# Patient Record
Sex: Female | Born: 1945
Health system: Southern US, Community
[De-identification: ages and names within clinical notes are randomized; demographics above are authoritative.]

## PROBLEM LIST (undated history)

## (undated) DIAGNOSIS — I219 Acute myocardial infarction, unspecified: Secondary | ICD-10-CM

## (undated) DIAGNOSIS — B029 Zoster without complications: Secondary | ICD-10-CM

## (undated) DIAGNOSIS — T8859XA Other complications of anesthesia, initial encounter: Secondary | ICD-10-CM

## (undated) DIAGNOSIS — E785 Hyperlipidemia, unspecified: Secondary | ICD-10-CM

## (undated) DIAGNOSIS — R112 Nausea with vomiting, unspecified: Secondary | ICD-10-CM

## (undated) DIAGNOSIS — T4145XA Adverse effect of unspecified anesthetic, initial encounter: Secondary | ICD-10-CM

## (undated) DIAGNOSIS — R51 Headache: Secondary | ICD-10-CM

## (undated) DIAGNOSIS — H269 Unspecified cataract: Secondary | ICD-10-CM

## (undated) DIAGNOSIS — Z8619 Personal history of other infectious and parasitic diseases: Secondary | ICD-10-CM

## (undated) DIAGNOSIS — Z9889 Other specified postprocedural states: Secondary | ICD-10-CM

## (undated) DIAGNOSIS — I10 Essential (primary) hypertension: Secondary | ICD-10-CM

## (undated) DIAGNOSIS — I341 Nonrheumatic mitral (valve) prolapse: Secondary | ICD-10-CM

## (undated) DIAGNOSIS — I671 Cerebral aneurysm, nonruptured: Secondary | ICD-10-CM

## (undated) DIAGNOSIS — F32A Depression, unspecified: Secondary | ICD-10-CM

## (undated) DIAGNOSIS — F419 Anxiety disorder, unspecified: Secondary | ICD-10-CM

## (undated) DIAGNOSIS — T7840XA Allergy, unspecified, initial encounter: Secondary | ICD-10-CM

## (undated) DIAGNOSIS — K219 Gastro-esophageal reflux disease without esophagitis: Secondary | ICD-10-CM

## (undated) DIAGNOSIS — F329 Major depressive disorder, single episode, unspecified: Secondary | ICD-10-CM

## (undated) DIAGNOSIS — R270 Ataxia, unspecified: Secondary | ICD-10-CM

## (undated) HISTORY — DX: Cerebral aneurysm, nonruptured: I67.1

## (undated) HISTORY — DX: Allergy, unspecified, initial encounter: T78.40XA

## (undated) HISTORY — PX: BRAIN SURGERY: SHX531

## (undated) HISTORY — DX: Ataxia, unspecified: R27.0

## (undated) HISTORY — DX: Unspecified cataract: H26.9

## (undated) HISTORY — PX: ECTOPIC PREGNANCY SURGERY: SHX613

## (undated) HISTORY — PX: VASCULAR SURGERY: SHX849

## (undated) HISTORY — PX: KNEE ARTHROSCOPY: SUR90

## (undated) HISTORY — PX: TUBAL LIGATION: SHX77

## (undated) HISTORY — PX: TONSILLECTOMY: SUR1361

## (undated) HISTORY — PX: ANEURYSM COILING: SHX5349

## (undated) HISTORY — PX: APPENDECTOMY: SHX54

## (undated) HISTORY — DX: Acute myocardial infarction, unspecified: I21.9

## (undated) SURGERY — MANOMETRY, ESOPHAGUS

---

## 1985-01-02 HISTORY — PX: ABDOMINAL HYSTERECTOMY: SHX81

## 1997-07-06 ENCOUNTER — Emergency Department (HOSPITAL_COMMUNITY): Admission: EM | Admit: 1997-07-06 | Discharge: 1997-07-06 | Payer: Self-pay | Admitting: Emergency Medicine

## 1998-12-31 ENCOUNTER — Ambulatory Visit (HOSPITAL_COMMUNITY): Admission: RE | Admit: 1998-12-31 | Discharge: 1998-12-31 | Payer: Self-pay | Admitting: Gastroenterology

## 1998-12-31 ENCOUNTER — Encounter (INDEPENDENT_AMBULATORY_CARE_PROVIDER_SITE_OTHER): Payer: Self-pay | Admitting: Specialist

## 1999-01-26 ENCOUNTER — Ambulatory Visit (HOSPITAL_COMMUNITY): Admission: RE | Admit: 1999-01-26 | Discharge: 1999-01-26 | Payer: Self-pay | Admitting: Gastroenterology

## 1999-01-26 ENCOUNTER — Encounter: Payer: Self-pay | Admitting: Gastroenterology

## 1999-07-01 ENCOUNTER — Emergency Department (HOSPITAL_COMMUNITY): Admission: EM | Admit: 1999-07-01 | Discharge: 1999-07-01 | Payer: Self-pay | Admitting: Emergency Medicine

## 1999-07-01 ENCOUNTER — Encounter: Payer: Self-pay | Admitting: Family Medicine

## 1999-07-01 ENCOUNTER — Encounter: Admission: RE | Admit: 1999-07-01 | Discharge: 1999-07-01 | Payer: Self-pay | Admitting: Family Medicine

## 1999-07-01 ENCOUNTER — Encounter: Payer: Self-pay | Admitting: Emergency Medicine

## 1999-07-28 ENCOUNTER — Encounter: Payer: Self-pay | Admitting: Family Medicine

## 1999-07-28 ENCOUNTER — Encounter: Admission: RE | Admit: 1999-07-28 | Discharge: 1999-07-28 | Payer: Self-pay | Admitting: Family Medicine

## 2000-02-24 ENCOUNTER — Encounter: Payer: Self-pay | Admitting: Family Medicine

## 2000-02-24 ENCOUNTER — Encounter: Admission: RE | Admit: 2000-02-24 | Discharge: 2000-02-24 | Payer: Self-pay | Admitting: Family Medicine

## 2000-10-01 ENCOUNTER — Encounter: Admission: RE | Admit: 2000-10-01 | Discharge: 2000-10-01 | Payer: Self-pay | Admitting: Family Medicine

## 2000-10-01 ENCOUNTER — Encounter: Payer: Self-pay | Admitting: Family Medicine

## 2000-10-15 ENCOUNTER — Encounter: Admission: RE | Admit: 2000-10-15 | Discharge: 2000-10-15 | Payer: Self-pay | Admitting: Gastroenterology

## 2000-10-15 ENCOUNTER — Encounter: Payer: Self-pay | Admitting: Gastroenterology

## 2000-10-24 ENCOUNTER — Encounter: Payer: Self-pay | Admitting: Family Medicine

## 2000-10-24 ENCOUNTER — Encounter: Admission: RE | Admit: 2000-10-24 | Discharge: 2000-10-24 | Payer: Self-pay | Admitting: Family Medicine

## 2001-03-01 ENCOUNTER — Other Ambulatory Visit: Admission: RE | Admit: 2001-03-01 | Discharge: 2001-03-01 | Payer: Self-pay | Admitting: Obstetrics and Gynecology

## 2001-03-21 ENCOUNTER — Ambulatory Visit (HOSPITAL_COMMUNITY): Admission: RE | Admit: 2001-03-21 | Discharge: 2001-03-21 | Payer: Self-pay | Admitting: Family Medicine

## 2001-03-21 ENCOUNTER — Encounter: Payer: Self-pay | Admitting: Family Medicine

## 2001-04-12 ENCOUNTER — Encounter: Payer: Self-pay | Admitting: Gastroenterology

## 2001-04-12 ENCOUNTER — Encounter: Admission: RE | Admit: 2001-04-12 | Discharge: 2001-04-12 | Payer: Self-pay | Admitting: Gastroenterology

## 2001-06-04 ENCOUNTER — Ambulatory Visit (HOSPITAL_BASED_OUTPATIENT_CLINIC_OR_DEPARTMENT_OTHER): Admission: RE | Admit: 2001-06-04 | Discharge: 2001-06-04 | Payer: Self-pay | Admitting: Urology

## 2001-06-28 ENCOUNTER — Encounter: Payer: Self-pay | Admitting: Family Medicine

## 2001-06-28 ENCOUNTER — Encounter: Admission: RE | Admit: 2001-06-28 | Discharge: 2001-06-28 | Payer: Self-pay | Admitting: Family Medicine

## 2002-02-24 ENCOUNTER — Encounter: Admission: RE | Admit: 2002-02-24 | Discharge: 2002-02-24 | Payer: Self-pay | Admitting: Family Medicine

## 2002-02-24 ENCOUNTER — Encounter: Payer: Self-pay | Admitting: Family Medicine

## 2002-04-04 ENCOUNTER — Encounter: Payer: Self-pay | Admitting: Interventional Cardiology

## 2002-04-04 ENCOUNTER — Ambulatory Visit (HOSPITAL_COMMUNITY): Admission: RE | Admit: 2002-04-04 | Discharge: 2002-04-04 | Payer: Self-pay | Admitting: Interventional Cardiology

## 2002-04-06 ENCOUNTER — Ambulatory Visit (HOSPITAL_COMMUNITY): Admission: RE | Admit: 2002-04-06 | Discharge: 2002-04-06 | Payer: Self-pay | Admitting: Family Medicine

## 2002-04-06 ENCOUNTER — Encounter: Payer: Self-pay | Admitting: Family Medicine

## 2004-07-12 ENCOUNTER — Encounter: Admission: RE | Admit: 2004-07-12 | Discharge: 2004-07-12 | Payer: Self-pay | Admitting: Family Medicine

## 2004-10-30 ENCOUNTER — Encounter: Admission: RE | Admit: 2004-10-30 | Discharge: 2004-10-30 | Payer: Self-pay | Admitting: Family Medicine

## 2004-12-15 ENCOUNTER — Ambulatory Visit (HOSPITAL_BASED_OUTPATIENT_CLINIC_OR_DEPARTMENT_OTHER): Admission: RE | Admit: 2004-12-15 | Discharge: 2004-12-15 | Payer: Self-pay | Admitting: Orthopedic Surgery

## 2004-12-15 ENCOUNTER — Ambulatory Visit (HOSPITAL_COMMUNITY): Admission: RE | Admit: 2004-12-15 | Discharge: 2004-12-15 | Payer: Self-pay | Admitting: Orthopedic Surgery

## 2005-06-21 ENCOUNTER — Emergency Department (HOSPITAL_COMMUNITY): Admission: EM | Admit: 2005-06-21 | Discharge: 2005-06-21 | Payer: Self-pay | Admitting: Emergency Medicine

## 2007-12-10 ENCOUNTER — Encounter: Admission: RE | Admit: 2007-12-10 | Discharge: 2007-12-10 | Payer: Self-pay | Admitting: Family Medicine

## 2008-04-15 ENCOUNTER — Inpatient Hospital Stay (HOSPITAL_COMMUNITY): Admission: EM | Admit: 2008-04-15 | Discharge: 2008-04-22 | Payer: Self-pay | Admitting: Emergency Medicine

## 2008-04-16 ENCOUNTER — Encounter (INDEPENDENT_AMBULATORY_CARE_PROVIDER_SITE_OTHER): Payer: Self-pay | Admitting: Internal Medicine

## 2008-05-05 ENCOUNTER — Encounter: Payer: Self-pay | Admitting: Interventional Radiology

## 2008-06-01 ENCOUNTER — Emergency Department (HOSPITAL_COMMUNITY): Admission: EM | Admit: 2008-06-01 | Discharge: 2008-06-01 | Payer: Self-pay | Admitting: Emergency Medicine

## 2008-06-04 ENCOUNTER — Ambulatory Visit (HOSPITAL_COMMUNITY): Admission: RE | Admit: 2008-06-04 | Discharge: 2008-06-04 | Payer: Self-pay | Admitting: Interventional Radiology

## 2008-06-17 ENCOUNTER — Encounter: Admission: RE | Admit: 2008-06-17 | Discharge: 2008-06-17 | Payer: Self-pay | Admitting: Neurology

## 2010-01-23 ENCOUNTER — Encounter: Payer: Self-pay | Admitting: Interventional Radiology

## 2010-01-23 ENCOUNTER — Encounter: Payer: Self-pay | Admitting: Family Medicine

## 2010-01-24 ENCOUNTER — Encounter: Payer: Self-pay | Admitting: Gastroenterology

## 2010-01-24 ENCOUNTER — Encounter: Payer: Self-pay | Admitting: Interventional Radiology

## 2010-01-29 IMAGING — XA IR TRANSCATH EMBOLIZATION
4 of 6 series · 12 of 24 positions shown · non-contrast
Comparison: Angiogram of 04/18/2008.

CLINICAL DATA: Severe headaches.  Finding of left middle cerebral
artery region aneurysm.  Strong family history of ruptured
intracranial aneurysms.

ENDOVASCULAR OBLITERATION OF LEFT MIDDLE CEREBRAL ARTERY REGION
ANEURYSM

[Series 1: run · 7 of 297 slices shown (1 of 4)]
[im 22/297]
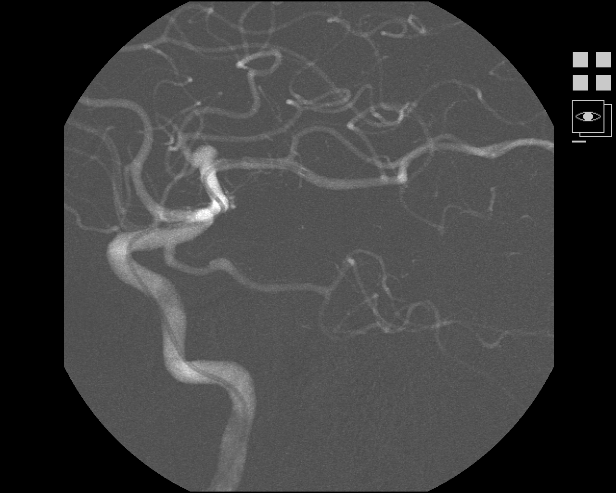
[im 64/297]
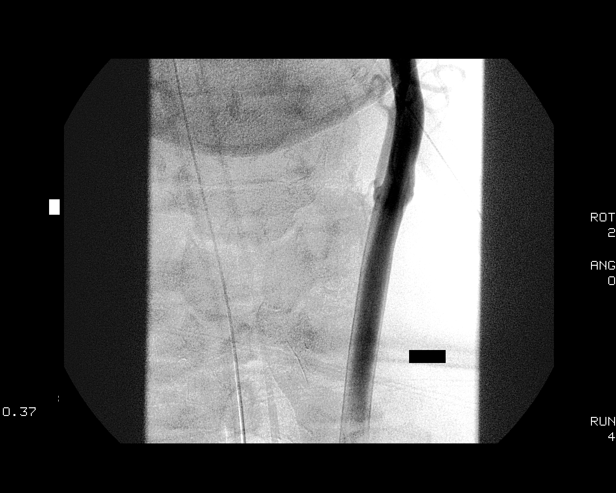
[im 106/297]
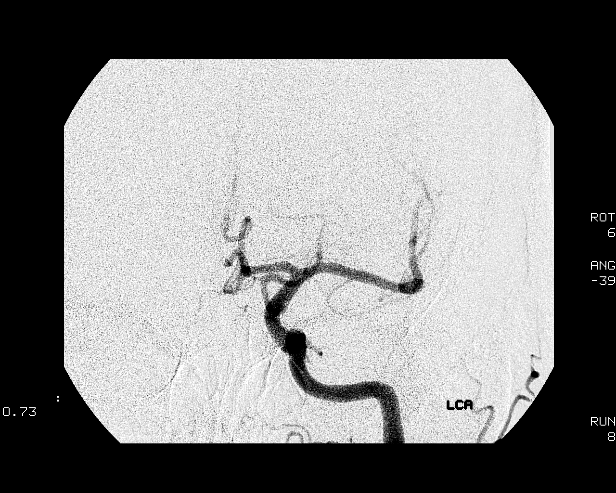
[im 149/297]
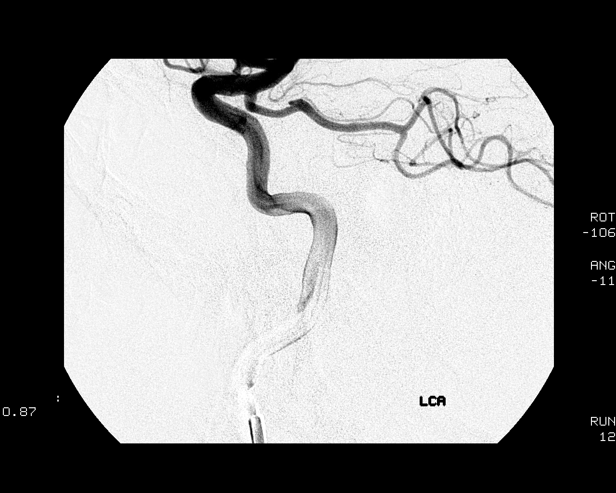
[im 191/297]
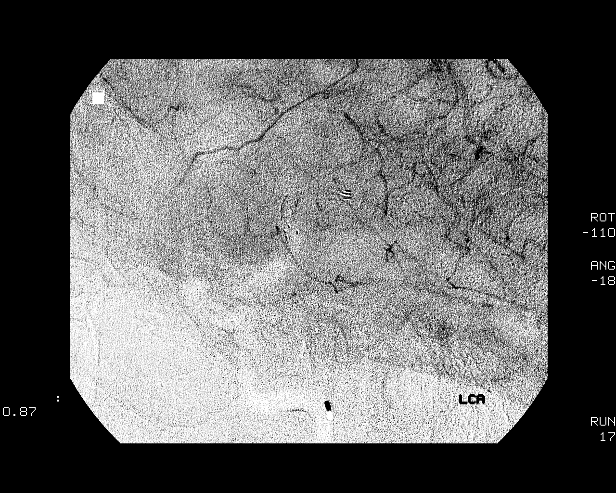
[im 233/297]
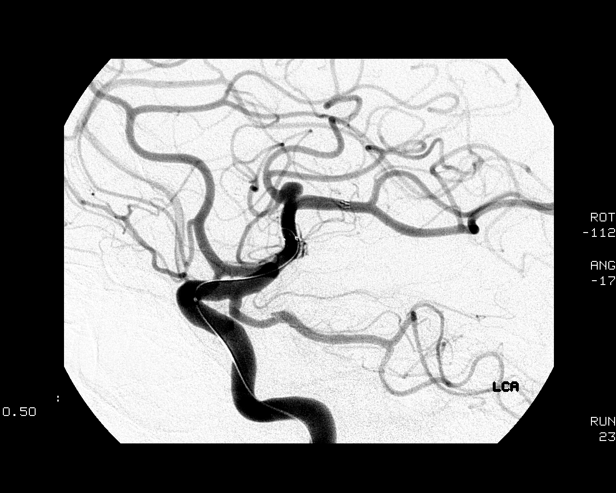
[im 275/297]
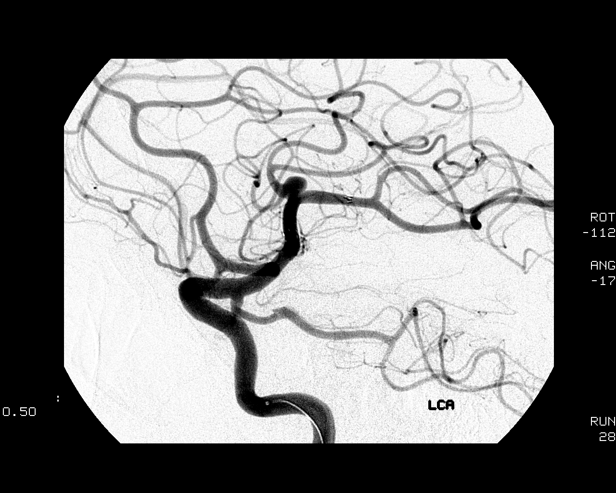

[Series 3: run · 3 of 120 slices shown (2 of 4)]
[im 1/120]
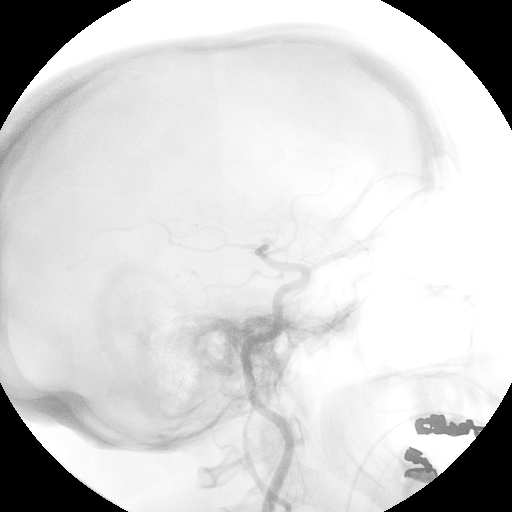
[im 60/120]
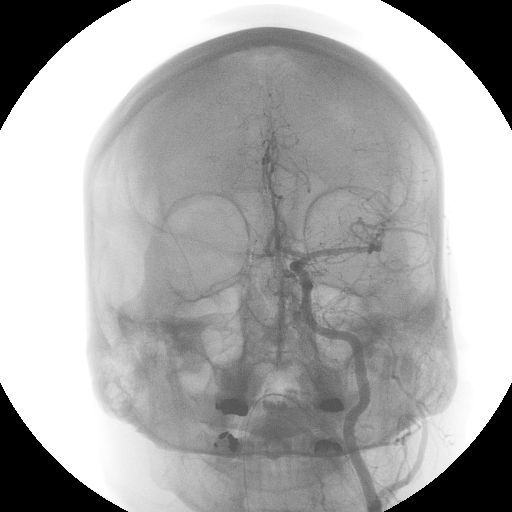
[im 120/120]
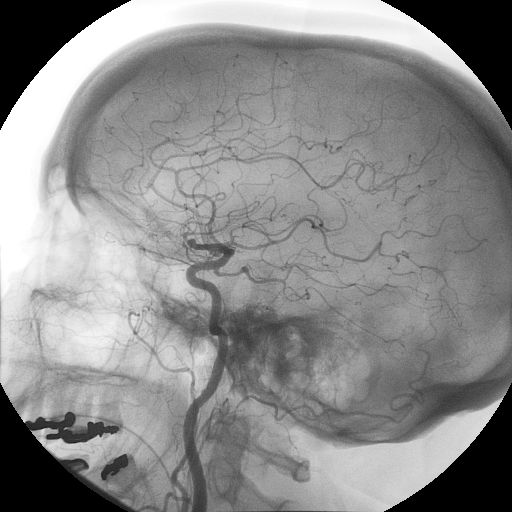

[Series 7: run · 1 of 8 slices shown (3 of 4)]
[im 1/8]
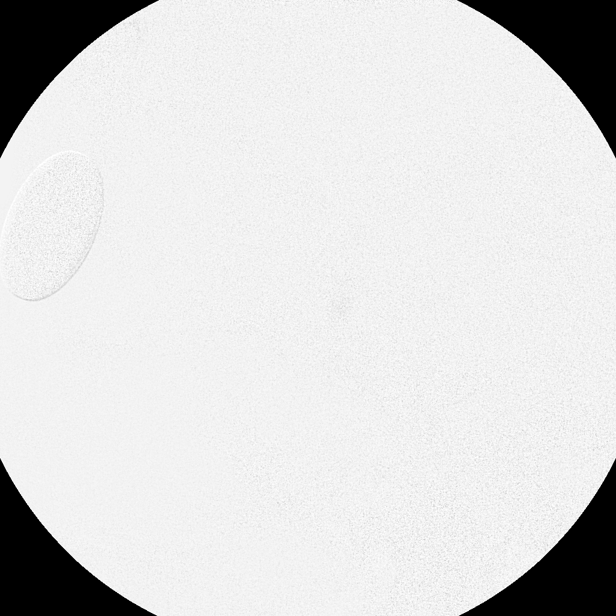

[Series 7: run · 1 of 8 slices shown (4 of 4)]
[im 1/8]
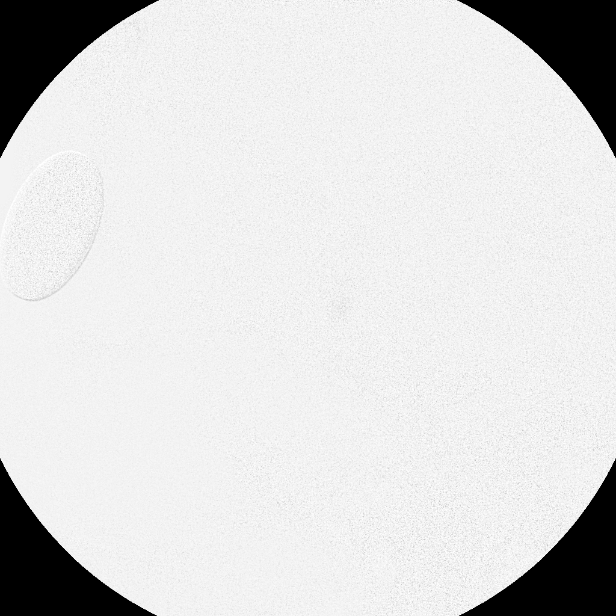

[12 of 24 positions shown; findings below may reference images not displayed]

Following a full explanation of the procedure along with the
potential associated complications, an informed witnessed consent
was obtained.

The patient was put under general anesthesia by the [REDACTED] at [HOSPITAL].

The right groin was prepped and draped in the usual sterile
fashion.  Thereafter using a modified Seldinger technique,
transfemoral access into the right common femoral artery was
obtained without difficulty.  Over a 0.035-inch guidewire, a 5-
French Pinnacle sheath was inserted.  Through this and also over a
0.035-inch guidewire, a 5-French JB1 catheter was advanced to the
aortic arch region and selectively positioned in the left common
carotid artery.  A left common carotid arteriogram centered over
the carotid bifurcation demonstrated wide patency of the left
external carotid artery and its branches.  Also the left internal
carotid artery from the bulb to the cranial skull base is normally
opacified.  The petrous, the cavernous and the supraclinoid
segments are seen to be normal.  A dominant left posterior
communicating artery is again noted opacifying the left posterior
cerebral and superior cerebellar artery distributions.

The left middle and the left anterior cerebral arteries are seen to
opacify normally into capillary and venous phases.  Cross
opacification via the anterior communicating artery of the right
anterior cerebral artery distal to the A2 segment is noted.

A 3-D rotational angiogram perfor[REDACTED]ed over the left
anterior circulation confirms the presence of a saccular wide neck
aneurysm between the origins of the superior and inferior divisions
of the left middle cerebral artery/

This measured approximately 4.5 mm x 3.5 mm.

The diagnostic JB1 catheter in the left common carotid artery was
exchanged over a 0.035-inch 300 cm Rosen exchange guidewire for a 6-
French 65 cm neurovascular sheath using biplane roadmap technique
and constant fluoroscopic guidance.  Good aspiration was obtained
from the side port of the neurovascular sheath.  A gentle contrast
injection demonstrated no evidence of spasm, dissections or of
intraluminal filling defects.  A 6-French 90 cm straight Brite Tip
Envoy guide catheter was then advanced and positioned just proximal
to the left common carotid artery bifurcation.  The guidewire was
removed.  Good aspiration was obtained from the hub of the 6-French
guide catheter.  Over a 0.035-inch Roadrunner guidewire, using
biplane roadmap technique, the 6-French guide catheter was advanced
into the distal left internal carotid artery.  The guidewire was
removed.  Good aspiration was obtained from the hub of the 6-French
guide catheter.  A gentle contrast injection demonstrated moderate
spasm of the tip.  This necessitated the proximal retrieval of part
of the guide catheter into the mid cervical portion.  Also, 25 mcg
of nitroglycerin were given intra-arterially via the guide
catheter.  No change was noted in the intracranial circulation.

In a coaxial manner and with constant heparinized saline infusion
using roadmap technique and constant fluoroscopic guidance, a
Prowler 14 select Plus 5 cm tip microcatheter was then advanced
over a 0.014-inch Softip Transend EX microguidewire to the distal
end of the guide catheter.  With the microguidewire leading with a
J-tip configuration, the combination was navigated without
difficulty into the supraclinoid left ICA.  The left middle
cerebral artery was entered with the microguidewire followed by the
microcatheter.  The microguidewire and microcatheter combination
was then manipulated without difficulty into the M2/M3 region of
the inferior division of the left middle cerebral artery.  The
guidewire was removed.  Good aspiration was obtained from the hub
of the microcatheter.  This was then connected to continuous
heparinized saline infusion.  A control arteriogram performed
through the 6-French guide catheter demonstrated safe positioning
of the tip of the microcatheter.

At this time a 4.5 mm x 22 mm Enterprise Cordis stent was prepped
and purged with heparinized saline infusion.  In a coaxial manner
and with constant heparinized saline infusion using biplane roadmap
technique and constant fluoroscopic guidance, this was advanced to
the distal end of the microcatheter in the M2/M3 region of the left
middle cerebral artery inferior division.  The stent delivery
markers were then centered over the neck of the aneurysm.  The
entire system was then straightened.  The O-ring on the delivery
microcatheter was loosened.

With slight forward gentle traction with the right hand on the
delivery microguidewire of the stent, the microcatheter was then
retrieved with the left hand, unsheathing the stent, deploying it
distal and proximal to the neck of the aneurysm.  Excellent
coverage was obtained.  The delivery microcatheter and
microguidewire were then retrieved and removed.  A control
arteriogram performed through the 6-French guide catheter
demonstrated excellent position and apposition of the stent.  Slow
flow was noted within the aneurysm itself.

At this time in a coaxial manner and with constant heparinized
saline infusion, an SL10 two-tip microcatheter which had been steam-
shaped was advanced over a 0.014-inch Softip Transend EX
microguidewire with a J-tip configuration to the distal end of the
guide catheter.  With the microguidewire leading, the combination
was manipulated and navigated with torque control into the aneurysm
just distal to the neck.  The microguidewire was gently retrieved
ensuring no forward movements of the microcatheter.  None was
observed.  This was then connected to continuous heparinized saline
infusion.  A control arteriogram performed through the 6-French
guide catheter demonstrated safe positioning of the microcatheter
within the aneurysm.  Subsequent attempts were made to advance a 3
mm x 6 cm HydroFrame 10 coil, a 3 mm x 5.4 cm MicruSphere Cerecyte
coil, and finally a 3 mm x 4 cm HydroSoft coil.  Each of these
coils was met with significant resistance with herniation of the
microcatheter into the parent vessel secondary to resistance of the
stent struts.  Aggressive maneuvering and manipulation was not
performed in view of the just placed stent lest it should move.
The procedure was therefore stopped.  A control arteriogram
performed through the 6-French guide catheter demonstrated
continued apposition of the stent without intraluminal filling
defects.  Slow flow was noted within the aneurysm itself.

It was decided to allow the stent to endothelialize prior to
placement of coils at the subsequent time.  No acute changes were
noted in the patient's blood pressure or neurological status.  The
patient's ACT was maintained in the region of 250 seconds.

The 6-French guide catheter and the 6-French neurovascular sheath
were then retrieved into the abdominal aorta and exchanged over a J-
tip guidewire for a 7-French Pinnacle sheath.  This was then
connected to continuous heparinized saline infusion.

The patient's general anesthesia was then reversed and the patient
was extubated.  Upon recovery, the patient demonstrated no new
neurological signs or symptoms.

The patient was then transported to the Neuro ICU for further
management.

IMPRESSION
1.  Status post staged endovascular treatment of relatively wide
neck left middle cerebral artery bifurcation aneurysm with
placement of endovascular Cordis stent.
2.  Placement of coils within the aneurysm was stopped for reasons
mentioned above.

The patient's overnight stay was unremarkable.  The following day
the right groin sheath was removed and hemostasis was achieved
after stopping the IV heparin.  The patient was ambulatory six
hours later.  She was discharged to return to the clinic in 2
weeks.  She will be placed on aspirin 81 mg and Plavix 75 mg.

## 2010-03-10 ENCOUNTER — Encounter (HOSPITAL_COMMUNITY): Payer: Self-pay | Admitting: Radiology

## 2010-03-10 ENCOUNTER — Emergency Department (HOSPITAL_COMMUNITY): Payer: Self-pay

## 2010-03-10 ENCOUNTER — Emergency Department (HOSPITAL_COMMUNITY)
Admission: EM | Admit: 2010-03-10 | Discharge: 2010-03-10 | Disposition: A | Discharge status: Home / Self Care | Payer: Self-pay | Attending: Emergency Medicine | Admitting: Emergency Medicine

## 2010-03-10 DIAGNOSIS — E7800 Pure hypercholesterolemia, unspecified: Secondary | ICD-10-CM | POA: Insufficient documentation

## 2010-03-10 DIAGNOSIS — R51 Headache: Secondary | ICD-10-CM | POA: Insufficient documentation

## 2010-03-10 DIAGNOSIS — Z79899 Other long term (current) drug therapy: Secondary | ICD-10-CM | POA: Insufficient documentation

## 2010-03-10 DIAGNOSIS — I10 Essential (primary) hypertension: Secondary | ICD-10-CM | POA: Insufficient documentation

## 2010-03-10 DIAGNOSIS — H538 Other visual disturbances: Secondary | ICD-10-CM | POA: Insufficient documentation

## 2010-03-10 DIAGNOSIS — F411 Generalized anxiety disorder: Secondary | ICD-10-CM | POA: Insufficient documentation

## 2010-03-10 HISTORY — DX: Nonrheumatic mitral (valve) prolapse: I34.1

## 2010-03-10 HISTORY — DX: Essential (primary) hypertension: I10

## 2010-03-10 MED ORDER — IOHEXOL 350 MG/ML SOLN
50.0000 mL | Freq: Once | INTRAVENOUS | Status: AC | PRN
  Administered 2010-03-10: 50 mL via INTRAVENOUS

## 2010-03-14 ENCOUNTER — Other Ambulatory Visit (HOSPITAL_COMMUNITY): Payer: Self-pay | Admitting: Interventional Radiology

## 2010-03-14 DIAGNOSIS — I729 Aneurysm of unspecified site: Secondary | ICD-10-CM

## 2010-03-17 ENCOUNTER — Ambulatory Visit (HOSPITAL_COMMUNITY)
Admission: RE | Admit: 2010-03-17 | Discharge: 2010-03-17 | Disposition: A | Discharge status: Home / Self Care | Payer: Self-pay | Source: Ambulatory Visit | Attending: Interventional Radiology | Admitting: Interventional Radiology

## 2010-03-17 ENCOUNTER — Other Ambulatory Visit (HOSPITAL_COMMUNITY): Payer: Self-pay | Admitting: Interventional Radiology

## 2010-03-17 ENCOUNTER — Encounter (HOSPITAL_COMMUNITY)
Admission: RE | Admit: 2010-03-17 | Discharge: 2010-03-17 | Disposition: A | Discharge status: Home / Self Care | Payer: Self-pay | Source: Ambulatory Visit | Attending: Interventional Radiology | Admitting: Interventional Radiology

## 2010-03-17 DIAGNOSIS — Z01812 Encounter for preprocedural laboratory examination: Secondary | ICD-10-CM | POA: Insufficient documentation

## 2010-03-17 DIAGNOSIS — I671 Cerebral aneurysm, nonruptured: Secondary | ICD-10-CM

## 2010-03-17 DIAGNOSIS — Z01818 Encounter for other preprocedural examination: Secondary | ICD-10-CM | POA: Insufficient documentation

## 2010-03-17 LAB — CBC
MCHC: 34.6 g/dL (ref 30.0–36.0)
Platelets: 251 10*3/uL (ref 150–400)
RDW: 12.4 % (ref 11.5–15.5)
WBC: 8.8 10*3/uL (ref 4.0–10.5)

## 2010-03-17 LAB — COMPREHENSIVE METABOLIC PANEL
Albumin: 4.4 g/dL (ref 3.5–5.2)
Alkaline Phosphatase: 64 U/L (ref 39–117)
BUN: 13 mg/dL (ref 6–23)
Chloride: 103 mEq/L (ref 96–112)
Creatinine, Ser: 0.76 mg/dL (ref 0.4–1.2)
Glucose, Bld: 92 mg/dL (ref 70–99)
Total Bilirubin: 0.8 mg/dL (ref 0.3–1.2)
Total Protein: 7 g/dL (ref 6.0–8.3)

## 2010-03-17 LAB — DIFFERENTIAL
Basophils Absolute: 0 10*3/uL (ref 0.0–0.1)
Basophils Relative: 1 % (ref 0–1)
Eosinophils Absolute: 0.5 10*3/uL (ref 0.0–0.7)
Eosinophils Relative: 6 % — ABNORMAL HIGH (ref 0–5)
Monocytes Absolute: 0.7 10*3/uL (ref 0.1–1.0)

## 2010-03-17 LAB — APTT: aPTT: 27 seconds (ref 24–37)

## 2010-03-17 LAB — SURGICAL PCR SCREEN
MRSA, PCR: NEGATIVE
Staphylococcus aureus: NEGATIVE

## 2010-03-17 LAB — PROTIME-INR: INR: 0.99 (ref 0.00–1.49)

## 2010-03-21 ENCOUNTER — Ambulatory Visit (HOSPITAL_COMMUNITY)
Admission: RE | Admit: 2010-03-21 | Discharge: 2010-03-21 | Disposition: A | Discharge status: Home / Self Care | Payer: Self-pay | Source: Ambulatory Visit | Attending: Interventional Radiology | Admitting: Interventional Radiology

## 2010-03-21 DIAGNOSIS — I729 Aneurysm of unspecified site: Secondary | ICD-10-CM

## 2010-03-21 DIAGNOSIS — I059 Rheumatic mitral valve disease, unspecified: Secondary | ICD-10-CM | POA: Insufficient documentation

## 2010-03-21 DIAGNOSIS — I6529 Occlusion and stenosis of unspecified carotid artery: Secondary | ICD-10-CM | POA: Insufficient documentation

## 2010-03-21 DIAGNOSIS — F341 Dysthymic disorder: Secondary | ICD-10-CM | POA: Insufficient documentation

## 2010-03-21 DIAGNOSIS — I671 Cerebral aneurysm, nonruptured: Secondary | ICD-10-CM | POA: Insufficient documentation

## 2010-03-21 DIAGNOSIS — Z9889 Other specified postprocedural states: Secondary | ICD-10-CM | POA: Insufficient documentation

## 2010-03-21 DIAGNOSIS — I10 Essential (primary) hypertension: Secondary | ICD-10-CM | POA: Insufficient documentation

## 2010-03-21 DIAGNOSIS — K219 Gastro-esophageal reflux disease without esophagitis: Secondary | ICD-10-CM | POA: Insufficient documentation

## 2010-03-21 DIAGNOSIS — R51 Headache: Secondary | ICD-10-CM | POA: Insufficient documentation

## 2010-03-21 DIAGNOSIS — K222 Esophageal obstruction: Secondary | ICD-10-CM | POA: Insufficient documentation

## 2010-03-21 MED ORDER — IOHEXOL 300 MG/ML  SOLN
150.0000 mL | Freq: Once | INTRAMUSCULAR | Status: AC | PRN
  Administered 2010-03-21: 60 mL via INTRA_ARTERIAL

## 2010-04-11 LAB — CBC
HCT: 38.3 % (ref 36.0–46.0)
Hemoglobin: 12.9 g/dL (ref 12.0–15.0)
MCHC: 33.8 g/dL (ref 30.0–36.0)
RDW: 12.9 % (ref 11.5–15.5)

## 2010-04-11 LAB — BASIC METABOLIC PANEL
BUN: 14 mg/dL (ref 6–23)
Chloride: 105 mEq/L (ref 96–112)
Creatinine, Ser: 0.65 mg/dL (ref 0.4–1.2)
GFR calc Af Amer: >60 mL/min (ref >60)
GFR calc non Af Amer: >60 mL/min (ref >60)

## 2010-04-11 LAB — DIFFERENTIAL
Basophils Absolute: 0.1 10*3/uL (ref 0.0–0.1)
Basophils Relative: 2 % — ABNORMAL HIGH (ref 0–1)
Eosinophils Absolute: 0.3 10*3/uL (ref 0.0–0.7)
Eosinophils Relative: 4 % (ref 0–5)
Monocytes Absolute: 0.4 10*3/uL (ref 0.1–1.0)

## 2010-04-13 LAB — URINALYSIS, ROUTINE W REFLEX MICROSCOPIC
Bilirubin Urine: NEGATIVE
Glucose, UA: NEGATIVE mg/dL
Hgb urine dipstick: NEGATIVE
Protein, ur: NEGATIVE mg/dL
Specific Gravity, Urine: 1.008 (ref 1.005–1.030)
Urobilinogen, UA: 0.2 mg/dL (ref 0.0–1.0)

## 2010-04-13 LAB — CBC
HCT: 35.2 % — ABNORMAL LOW (ref 36.0–46.0)
HCT: 36.2 % (ref 36.0–46.0)
HCT: 37.5 % (ref 36.0–46.0)
Hemoglobin: 12.1 g/dL (ref 12.0–15.0)
MCHC: 34.3 g/dL (ref 30.0–36.0)
MCHC: 34.6 g/dL (ref 30.0–36.0)
MCHC: 35.1 g/dL (ref 30.0–36.0)
MCHC: 34.6 g/dL (ref 30.0–36.0)
MCHC: 34.8 g/dL (ref 30.0–36.0)
MCV: 91.4 fL (ref 78.0–100.0)
MCV: 92.3 fL (ref 78.0–100.0)
MCV: 93 fL (ref 78.0–100.0)
MCV: 91.6 fL (ref 78.0–100.0)
MCV: 92.5 fL (ref 78.0–100.0)
Platelets: 191 10*3/uL (ref 150–400)
Platelets: 218 10*3/uL (ref 150–400)
Platelets: 220 10*3/uL (ref 150–400)
Platelets: 215 10*3/uL (ref 150–400)
RBC: 3.87 MIL/uL (ref 3.87–5.11)
RBC: 4.09 MIL/uL (ref 3.87–5.11)
RDW: 12.9 % (ref 11.5–15.5)
RDW: 13 % (ref 11.5–15.5)
RDW: 13 % (ref 11.5–15.5)
RDW: 13.1 % (ref 11.5–15.5)
WBC: 5.3 10*3/uL (ref 4.0–10.5)
WBC: 7 10*3/uL (ref 4.0–10.5)

## 2010-04-13 LAB — BASIC METABOLIC PANEL
BUN: 8 mg/dL (ref 6–23)
BUN: 9 mg/dL (ref 6–23)
BUN: 12 mg/dL (ref 6–23)
CO2: 20 mEq/L (ref 19–32)
CO2: 29 mEq/L (ref 19–32)
CO2: 30 mEq/L (ref 19–32)
Calcium: 7.9 mg/dL — ABNORMAL LOW (ref 8.4–10.5)
Calcium: 8.9 mg/dL (ref 8.4–10.5)
Calcium: 8.7 mg/dL (ref 8.4–10.5)
Chloride: 102 mEq/L (ref 96–112)
Chloride: 104 mEq/L (ref 96–112)
Chloride: 104 mEq/L (ref 96–112)
Chloride: 106 mEq/L (ref 96–112)
Creatinine, Ser: 0.58 mg/dL (ref 0.4–1.2)
Creatinine, Ser: 0.67 mg/dL (ref 0.4–1.2)
Creatinine, Ser: 0.73 mg/dL (ref 0.4–1.2)
Creatinine, Ser: 0.73 mg/dL (ref 0.4–1.2)
Creatinine, Ser: 0.74 mg/dL (ref 0.4–1.2)
GFR calc Af Amer: >60 mL/min (ref >60)
GFR calc Af Amer: >60 mL/min (ref >60)
GFR calc non Af Amer: >60 mL/min (ref >60)
Glucose, Bld: 83 mg/dL (ref 70–99)
Glucose, Bld: 93 mg/dL (ref 70–99)
Potassium: 4.1 mEq/L (ref 3.5–5.1)
Sodium: 141 mEq/L (ref 135–145)

## 2010-04-13 LAB — PROTIME-INR
INR: 1.1 (ref 0.00–1.49)
INR: 1.1 (ref 0.00–1.49)
Prothrombin Time: 14.4 seconds (ref 11.6–15.2)
Prothrombin Time: 14.3 seconds (ref 11.6–15.2)

## 2010-04-13 LAB — CARDIAC PANEL(CRET KIN+CKTOT+MB+TROPI)
Relative Index: INVALID (ref 0.0–2.5)
Total CK: 50 U/L (ref 7–177)

## 2010-04-13 LAB — POCT CARDIAC MARKERS
CKMB, poc: <1 ng/mL — ABNORMAL LOW (ref 1.0–8.0)
Myoglobin, poc: 46.4 ng/mL (ref 12–200)
Troponin i, poc: <0.05 ng/mL (ref 0.00–0.09)

## 2010-04-13 LAB — DIFFERENTIAL
Basophils Absolute: 0 10*3/uL (ref 0.0–0.1)
Lymphocytes Relative: 23 % (ref 12–46)
Lymphs Abs: 1.7 10*3/uL (ref 0.7–4.0)
Neutro Abs: 4.8 10*3/uL (ref 1.7–7.7)
Neutrophils Relative %: 65 % (ref 43–77)

## 2010-04-13 LAB — LIPID PANEL
Cholesterol: 180 mg/dL (ref 0–200)
HDL: 49 mg/dL (ref >39)
LDL Cholesterol: 101 mg/dL — ABNORMAL HIGH (ref 0–99)
Total CHOL/HDL Ratio: 3.7 RATIO

## 2010-04-13 LAB — TROPONIN I: Troponin I: <0.01 ng/mL (ref 0.00–0.06)

## 2010-04-13 LAB — COMPREHENSIVE METABOLIC PANEL
AST: 35 U/L (ref 0–37)
Albumin: 3.3 g/dL — ABNORMAL LOW (ref 3.5–5.2)
Alkaline Phosphatase: 53 U/L (ref 39–117)
BUN: 9 mg/dL (ref 6–23)
BUN: 11 mg/dL (ref 6–23)
CO2: 27 mEq/L (ref 19–32)
Calcium: 9.4 mg/dL (ref 8.4–10.5)
Creatinine, Ser: 0.75 mg/dL (ref 0.4–1.2)
Creatinine, Ser: 0.6 mg/dL (ref 0.4–1.2)
GFR calc Af Amer: >60 mL/min (ref >60)
GFR calc non Af Amer: >60 mL/min (ref >60)
Potassium: 4 mEq/L (ref 3.5–5.1)
Total Bilirubin: 0.7 mg/dL (ref 0.3–1.2)
Total Protein: 5.8 g/dL — ABNORMAL LOW (ref 6.0–8.3)

## 2010-04-13 LAB — BRAIN NATRIURETIC PEPTIDE: Pro B Natriuretic peptide (BNP): 83 pg/mL (ref 0.0–100.0)

## 2010-04-13 LAB — MAGNESIUM: Magnesium: 2.1 mg/dL (ref 1.5–2.5)

## 2010-04-13 LAB — LIPASE, BLOOD: Lipase: 34 U/L (ref 11–59)

## 2010-05-17 NOTE — Consult Note (Signed)
NAMEFERRIS, Amber Brooks             ACCOUNT NO.:  [id number]   MEDICAL RECORD NO.:  [id number]          PATIENT TYPE:  OBV   LOCATION:  3736                         FACILITY:  [facility]   PHYSICIAN:  Jake Bathe, MD      DATE OF BIRTH:  29-Nov-1945   DATE OF CONSULTATION:  04/20/2008  DATE OF DISCHARGE:                                 CONSULTATION   REFERRING PHYSICIAN:  Sanjeev K. Corliss Skains, MD with Interventional  Radiology.   PRIMARY PHYSICIAN:  Donia Guiles, MD   CARDIOLOGIST:  Lyn Records, MD   REASON FOR CONSULTATION:  Preoperative risk assessment for aneurysmal  clipping of left middle cerebral artery.   HISTORY OF PRESENT ILLNESS:  A 65 year old female with no prior  cardiovascular history, who recently had a nuclear stress test in  December 2009, read by Dr. Katrinka Blazing with normal ejection fraction, low  risk, no ischemia present.  She was having chest pain at that time,  which was intermittent.  She was admitted on April 15, 2008, for chest  discomfort, and she ended up having a barium swallow, which reproduced  the chest pain exactly.  This resolved with hyoscyamine.  She also  underwent an echocardiogram here during this hospitalization after an  EKG showed poor R-wave progression, concerning for possible old anterior  myocardial infarction.  She also had small nonpathologic Q waves in the  inferior leads.  We were asked for preoperative risk assessment.  She  denies any significant difficulties with ambulation of 1-2 flights of  stairs.  No significant dyspnea.  She has no syncope, no bleeding  problems.  As stated above, nuclear stress test is low risk.  MRI/MRA  showed a 6-mm left MCA bifurcation aneurysm, which is going to be coiled  tomorrow.   PAST MEDICAL HISTORY:  1. Hypertension.  2. Mitral valve prolapse.  3. Hyperlipidemia.  4. Anxiety.  5. Migraines.  6. Seasonal allergies.  7. Depression.  8. GERD.  9. Esophageal strictures status post  dilatation.   ALLERGIES:  SULFA.   MEDICATIONS:  Aspirin, Plavix, estradiol, Vytorin, hydrochlorothiazide  12.5 mg once a day, nadolol 40 mg a day, Benicar 40 mg a day, Protonix  40 mg a day.   SOCIAL HISTORY:  She lives in [place].  She is divorced.  She is a  Sales executive.  No smoking.  No alcohol.  No drug use.   FAMILY HISTORY:  Her mother died at age 39 from COPD.  Her father died  at age 19 from heart failure and coronary artery disease.   REVIEW OF SYSTEMS:  Positive for headache, which did prompt her brain  MRI/MRA.  Chest pain, which is reproducible with barium swallow.  Otherwise, all 12-review of systems negative.   PHYSICAL EXAMINATION:  VITAL SIGNS:  Temperature 97.2, pulse 64,  respirations 18, blood pressure 108/48, oxygen saturation 90% on room  air.  GENERAL:  Alert and oriented x3, in no acute distress.  HEENT:  Eyes, well-perfused conjunctivae.  EOMI.  No scleral icterus.  NECK:  Supple.  No lymphadenopathy.  No thyromegaly.  No carotid bruits.  No JVD.  Normal carotid upstrokes.  Full range of motion.  CARDIOVASCULAR:  Regular rate and rhythm without any appreciable  murmurs, rubs, or gallops or clicks.  Normal PMI.  LUNGS:  Clear to auscultation bilaterally.  Normal respiratory effort.  ABDOMEN:  Soft, nontender, normoactive bowel sounds.  No rebound.  No  guarding.  No bruits.  EXTREMITIES:  No clubbing, cyanosis, or edema.  Normal distal pulses.  NEUROLOGIC:  Nonfocal.  No tremors.  SKIN:  Warm, dry, intact.  No rashes.  PSYCH:  Normal affect.   Chest x-ray personally viewed shows no acute disease.  MRI of brain  shows a 6 x 2 x 5 mm left MCA bifurcation aneurysm.  Barium swallow  showed nonspecific dysmotility.  Ultrasound of her abdomen was negative.   White count 5.8, hemoglobin 12.4, BUN 9, creatinine 0.7, glucose normal,  magnesium 2.1.  D-dimer normal.  Cardiac biomarkers negative x3.   EKG personally viewed shows normal sinus rhythm, rate  64 with small Q  waves inferiorly, nonpathologic with poor R-wave progression.  No change  from prior ECG in December 2009.   ASSESSMENT AND PLAN:  1. Preoperative cardiovascular risk assessment - she is low risk from      a cardiovascular standpoint for brain aneurysmal coiling.  She      recently had a nuclear stress test, which was low risk showing no      evidence of ischemia with normal ejection fraction.  Her      echocardiogram also during this hospitalization was reassuring      showing no wall motion abnormalities.  Continue to monitor blood      pressure closely as she is on multiple antihypertensive agents.  2. Hypertension - as above, on both nadolol and Benicar, as well as      hydrochlorothiazide.  Continue to monitor closely.  3. Hyperlipidemia - continue Vytorin.  4. Chest pain/epigastric pain - at this point, it seems as though the      barium swallow demonstrated her exact      symptoms of chest discomfort.  Continue to treat from a GI      perspective.  We will be happy to answer any other questions.      Findings have been relayed to physician assistance, Michael Litter, with Neurointerventional Radiology.      Jake Bathe, MD  Electronically Signed     MCS/MEDQ  D:  04/20/2008  T:  04/21/2008  Job:  [id number]   cc:   Donia Guiles, M.D.  Lyn Records, M.D.  Sanjeev K. Corliss Skains, M.D.

## 2010-05-17 NOTE — Consult Note (Signed)
NAMESHANTE, ARCHAMBEAULT             ACCOUNT NO.:  [id number]   MEDICAL RECORD NO.:  [id number]          PATIENT TYPE:  OBV   LOCATION:                               FACILITY:  [facility]   PHYSICIAN:  John C. Madilyn Fireman, M.D.    DATE OF BIRTH:  1945-04-30   DATE OF CONSULTATION:  DATE OF DISCHARGE:                                 CONSULTATION   Ms. Otte is a very pleasant 65 year old female, who has a history of  esophageal dysphagia.  She is status post esophageal dilation.  She had  chest pain several days ago and was admitted to the hospital thinking  that she was having a heart attack.  Her chest pain occurred while she  was trying to swallow pills after eating bread.  She occasionally has  dysphagia to both solids and liquids; however, she denies heartburn,  abdominal pain, or changes in bowel habits.  Her chest pain is typically  long lasting, and it does radiate to her right shoulder.  The patient  reports the family history is strong for gallbladder disease.  She has  had a negative Cardiology workup here in the hospital.  She did have an  incidental finding on MRI scan of an MCA bifurcation aneurysm, for which  she is having Interventional Radiology coiling done tomorrow.   The patient reports 2 previous esophageal dilations, both of which  helped her dysphagia for a period of time.   PAST MEDICAL HISTORY:  Significant for:  1. Hypertension.  2. Hyperlipidemia.  3. Mitral valve prolapse.  4. Anxiety.  5. Allergies.  6. Depression.  7. History of urinary incontinence status post suprapubic arch sling.  8. History of rotator cuff tear/repair/debridement.  9. History of esophageal spasm status post dilation.   MEDICATIONS:  Nexium, the rest are per chart.   ALLERGIES:  SULFA.   REVIEW OF SYSTEMS:  Significant for severe headaches.   SOCIAL HISTORY:  Negative for alcohol, tobacco, and drugs.  She works as  a Sales executive.   FAMILY HISTORY:  Significant for colon cancer  in 1 brother, liver cancer  in 1 brother, and gallbladder disease in multiple siblings including her  daughter and her mother.   PHYSICAL EXAMINATION:  GENERAL:  She is alert and oriented, in no  apparent distress.  VITAL SIGNS:  Her temperature is 97.2, pulse 50, respirations 18, blood  pressure is 108/48.  HEART:  Regular rate and rhythm.  LUNGS:  Clear.  ABDOMEN:  Soft, nontender, nondistended with good bowel sounds.   LABORATORY DATA:  Hemoglobin of 12.4, hematocrit 36.2, white count 5.8,  platelets 191,000.  BMET within normal limits.  LFTs are within normal  limits.  Amylase and lipase are within normal limits.  She had an  ultrasound done of her abdomen that showed no acute cholecystitis.  She  had a barium swallow done that showed esophageal motility disorder with  disruption of all primary peristaltic waves.  She did have a 13-mm  tablet that did not pass despite lack of stricture.   ASSESSMENT:  Dr. Dorena Cookey has seen and examined the patient,  collected  a history, and reviewed her chart.  His impression is that this is a 60-  year-old female who has esophageal motility disorder and spasm, for  which she is doing much better on Levbid.  She also has an middle  cerebral artery aneurysm, diagnosed here in the hospital.  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  Her LFTs are normal.  Her gallbladder ultrasound is  negative.  We would recommend:  1. Esophageal dilations as an outpatient in 2-3 weeks after her      aneurysm coiling.  2. To check HIDA scan as an inpatient before discharge.   Thanks very much for this consultation.      Stephani Police, PA    ______________________________  Everardo All Madilyn Fireman, M.D.    MLY/MEDQ  D:  04/20/2008  T:  04/21/2008  Job:  [id number]

## 2010-05-17 NOTE — Consult Note (Signed)
NAME:  Amber Brooks, Amber Brooks             ACCOUNT NO.:  [id number]   MEDICAL RECORD NO.:  [id number]          PATIENT TYPE:  OUT   LOCATION:  XRAY                         FACILITY:  [facility]   PHYSICIAN:  Sanjeev K. Deveshwar, M.D.DATE OF BIRTH:  03-15-1945   DATE OF CONSULTATION:  DATE OF DISCHARGE:                                 CONSULTATION   CHIEF COMPLAINT:  Status post left middle cerebral artery aneurysm  stenting performed on April 21, 2008.   BRIEF HISTORY:  This is a very pleasant 65 year old female, who was  recently admitted to [facility] on April 15, 2008, for  evaluation of chest pain.  During that admission an MI was ruled out and  was felt that her symptoms were probably GI in etiology.  She began to  experience headaches and visual problems and an MRI/MRA was ordered.  This was performed on April 17, 2008.  The patient was found to have a  left middle cerebral artery aneurysm.  Dr. Corliss Skains was asked to  evaluate the patient.  He did not feel that the patient's symptoms of  headache and visual problems were coming from the aneurysm, although he  did recommend repair of the aneurysm.  The procedures were described in  detail as well as the risks and benefits.  The patient was also seen in  consultation by Dr. Colon Branch, a neurosurgeon, who also agreed with  endovascular treatment of the aneurysm.   On April 21, 2008, the patient had a cerebral angiogram, which did  confirm a 4.5 mm x 3.5 mm unruptured left middle cerebral artery  aneurysm.  The plan was for a stent-assisted coiling procedure.  However, after the stent was placed, the aneurysm could not be coiled  due to movement of the stent.  A decision was made to make this a staged  procedure, where the patient will return at a later date for coiling.   The patient returns today and accompanied by her daughter to be seen in  followup.   PAST MEDICAL HISTORY:  Significant for hypertension, gastroesophageal  reflux disease, hyperlipidemia, anxiety, depression, and esophageal  stricture.  She had a 2-D echo performed on April 16, 2008, that  revealed normal systolic function.  She had a stress test in December  2009, which apparently was normal.  She does have some allergies.  She  does have a remote history of severe headaches.   SURGICAL HISTORY:  Significant for rotator cuff repair and bladder  surgery.  She has had nausea and vomiting in the past with anesthesia.   CURRENT MEDICATIONS:  1. Nadolol.  2. Fexofenadine.  3. Plavix.  4. K-Dur.  5. Estradiol.  6. Effexor.  7. Aspirin 81 mg.  8. Nexium.  9. Micardis.  10.Alprazolam.  11.Midrin.  12.Ultram for pain.  13.Hyoscyamine.   SOCIAL HISTORY:  The patient lives with her adopted daughter in  [place].  The patient is divorced.  She works as a Sales executive.  She does not use alcohol or tobacco.   FAMILY HISTORY:  The patient's mother died at age 8 from COPD.  Her  father died at age 56 from heart disease.  Apparently, there is a family  history of cerebral aneurysms.   IMPRESSION AND PLAN:  The patient returns today to be seen in followup  after undergoing coiling of a left middle cerebral artery aneurysm on  April 21, 2008.  She had developed headaches and visual changes while in  the hospital prior to the intervention.  She states she is still having  headaches, which she describes as being a 9 on a 1-10 scale.  She feels  her headaches are more frequent.  She is continued to have visual  problems, which she describes as a blurring of the vision, white spots  in her vision, and occasional tunnel vision.  She saw her primary care  practice and was changed from Imitrex to Midrin.  She continues to have  headaches almost daily and a headache sometimes last all day long.  She  does have a remote history of severe headaches in her 67s.  These were  treated with a beta-blocker at that time and for the most part resolved.  We  did recommend that the patient see a headache specialist, as Dr.  Corliss Skains does not feel that the headaches or the visual changes are  related to her aneurysm.   The patient is anxious to have further treatment of her aneurysm in the  form of a coiling as previously discussed with Dr. Corliss Skains.  Dr.  Corliss Skains felt that this could be accomplished in approximately 6 weeks  from today.  He recommended that the patient change her Plavix from  every day to every other day and to continue until she only has 3 pills  left.  These will be taken just prior to her next intervention.  She is  to increase her aspirin from 81 mg to 325 mg, when she stops the Plavix.  The patient apparently is continued to have some GI problems and she is  due to have a HIDA scan on April 20, 2008, however, the  gastroenterologist are hesitant to proceed with any further evaluation  until her aneurysm has been completely addressed and she is able to come  off the Plavix.   Dr. Corliss Skains told the patient that she could return to work at this  time if she would like, she could resume her normal activities, and once  again we will plan coiling of the aneurysm to be performed in  approximately 6 weeks.      Delton See, P.A.    ______________________________  Grandville Silos. Corliss Skains, M.D.    DR/MEDQ  D:  05/05/2008  T:  05/06/2008  Job:  [id number]   cc:   Clydene Fake, M.D.  Ramiro Harvest, MD  Donia Guiles, M.D.  Lyn Records, M.D.
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
## 2010-05-20 NOTE — Op Note (Signed)
[facility]  Patient:    Amber Brooks, Amber Brooks Visit Number: [id number] MRN: [id number]          Service Type: NES Location: NESC Attending Physician:  Evlyn Clines Dictated by:   Excell Seltzer. Annabell Howells, M.D. Proc. Date: 06/04/01 Admit Date:  06/04/2001   CC:         Guy Sandifer. Arleta Creek, M.D.  Desma Maxim, M.D.  Darci Needle, M.D.   Operative Report  PROCEDURE:  SPARC sling.  PREOPERATIVE DIAGNOSIS:  Stress urinary incontinence.  POSTOPERATIVE DIAGNOSIS:  Stress urinary incontinence.  SURGEON:  Excell Seltzer. Annabell Howells, M.D.  ANESTHESIA:  General.  DRAIN:  Foley.  COMPLICATIONS:  None.  INDICATIONS:  Ms. Green is a 65 year old white female with stress urinary incontinence, who has elected to undergo a SPARC sling.  FINDINGS AND PROCEDURE:  The patient was given p.o. Levaquin and ampicillin preoperatively.  She was taken to the operating room where general anesthetic was induced.  She was placed in lithotomy position, and her mons was shaved. She was prepped with Betadine solution, and she was draped in the usual sterile fashion.  Two 1.5 cm incisions were made above the pubis, 2 cm lateral to the midline, and one each side.  A weighted vaginal retractor was then placed.  A Foley catheter was inserted, and the bladder was drained.  The anterior vaginal wall over the mid urethral area was infiltrated with 5 cc of 1% lidocaine with epinephrine, and a mid anterior incision was made over this area.  The vaginal mucosa was elevated off the pubourethral fascia for approximately 2 cm laterally  on each side.  The [facility] trocars were then brought onto the field.  The first was passed through the right abdominal incision.  The tip was passed down through the abdominal wall fascia until it touched the pubis.  It was then walked along the back of the pubis until it could be palpated with a finger in the right vaginal incision, which was also being  used to hold the urethra out of the way.  The trocar was then advanced into the vaginal incision.  This was repeated on the left.  Cystoscopy was then performed.   No evidence of bladder wall injury or urethral injury was identified.  I then passed the [facility] sling material to the trocars and drew them back into the abdominal incisions.  Cystoscopy was repeated; no evidence of bladder wall injury was noted once again.  At this point, the [facility] sling was approximately tensioned, and the sheaths were removed.  The bladder was left full after the last cystoscopy, and pressure on the bladder was used to produce urine flow that allowed assessment of the sling tension.  Once tension was felt to be adequate, the Foley catheter was reinserted.  I was able to pass a fine right angle beneath the sling easily, indicating appropriate gap. The anterior vaginal wall was closed using a running locked 2-0 Vicryl stitch. The suprapubic incisions were closed with tincture of Benzoin and Steri-Strips after trimming the redundant sling material.  At this point, a two-inch Iodoform vaginal pack was placed.  The Foley was placed to straight drainage. The patient was taken down from lithotomy position; her anesthetic was reversed, and she moved to the recovery room in stable condition.  There were no complications. Dictated by:   Excell Seltzer. Annabell Howells, M.D. Attending Physician:  Evlyn Clines DD:  06/04/01 TD:  06/05/01 Job: [id number]

## 2010-05-20 NOTE — Op Note (Signed)
Amber Brooks, Amber Brooks             ACCOUNT NO.:  [id number]   MEDICAL RECORD NO.:  [id number]          PATIENT TYPE:  AMB   LOCATION:  DSC                          FACILITY:  [facility]   PHYSICIAN:  Nadara Mustard, MD     DATE OF BIRTH:  1945/04/12   DATE OF PROCEDURE:  12/15/2004  DATE OF DISCHARGE:                                 OPERATIVE REPORT   PREOPERATIVE DIAGNOSIS:  Right shoulder impingement syndrome with rotator  cuff tear.   POSTOPERATIVE DIAGNOSIS:  Right shoulder rotator cuff tear was hooked type  III acromion with impingement syndrome and a grade 1 superior labrum  anterior and posterior lesion.   PROCEDURE:  1.  Debridement of rotator cuff tear and debridement of superior labrum      anterior and posterior lesion.  2.  Subacromial decompression.   SURGEON.:  Nadara Mustard, MD   ANESTHESIA:  LMA plus interscalene block.   ESTIMATED BLOOD LOSS:  Minimal.   ANTIBIOTICS:  None.   DRAINS:  None.   COMPLICATIONS:  None.   DISPOSITION:  To PACU in stable condition.   INDICATION FOR PROCEDURE:  The patient is a 65 year old woman who has a  traumatic injury to her right shoulder.  She had failed conservative care.  MRI scan confirmed a rotator cuff tear.  The patient presents at this time  for surgical intervention after failure of conservative care.  The risks and  benefits were discussed including infection, neurovascular injury,  persistent pain, need for additional surgery.  The patient states he  understands and wishes to proceed at this time.   DESCRIPTION OF PROCEDURE:  The patient was brought to OR room 8 after  undergoing an interscalene block.  The patient then underwent a general  anesthetic.  After an adequate level of anesthesia was obtained, the patient  was placed in the beach-chair position and her right upper extremity was  prepped using DuraPrep and draped in a sterile field.  The scope was  inserted from the posterior portal and an anterior  portal was established  with outside-in technique with an 18-gauge spinal.  The intra-articular  visualization showed a grade 1 SLAP lesion, which was debrided with a shaver  and the vapor Mitek.  The patient did have a tear of the rotator cuff which  measured less than 1 x 1 cm.  This was debrided.  She had good attachment of  the rotator cuff at the humeral head.  The biceps tendon was intact.  There  were no articular cartilage defects of the humeral head or glenoid.  The  biceps had a good attachment.  The instruments were then removed.  The scope  was then inserted from the posterior portal in the subacromial space and a  lateral portal was established.  The patient has a significant amount of  subacromial bursitis and this was debrided with the shaver and the vapor  Mitek.  She had very hooked type III acromion and this was debrided with the  acromionizer.  Post-debridement showed good decompression.  The rotator cuff  tear was then again debrided from  the subacromial space.  The instruments  removed and the portals were closed using 4-0 nylon.  The wounds were  covered with Adaptic, orthopedic sponges, ABD dressing and Hypafix tape.  The patient was extubated, placed in a sling and taken to the PACU in stable  condition.   Plan for discharge to home.  Follow up in the office in 2 weeks.      Nadara Mustard, MD  Electronically Signed     MVD/MEDQ  D:  12/15/2004  T:  12/19/2004  Job:  [id number]

## 2010-05-20 NOTE — Discharge Summary (Signed)
Amber Brooks, Amber Brooks             ACCOUNT NO.:  [id number]   MEDICAL RECORD NO.:  [id number]          PATIENT TYPE:  INP   LOCATION:  3101                         FACILITY:  [facility]   PHYSICIAN:  Kela Millin, M.D.DATE OF BIRTH:  09-Apr-1945   DATE OF ADMISSION:  04/15/2008  DATE OF DISCHARGE:  04/22/2008                               DISCHARGE SUMMARY   DISCHARGE DIAGNOSES:  1. Chest pain, secondary to esophageal dysmotility with stricture.      The patient to follow up with gastroenterology.  2. 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 Corliss Skains.  Coil placement met with instability of the      stent per Dr. Corliss Skains and it was stopped.  3. Hypertension.  4. Hypokalemia, potassium replaced.  5. History of depression/anxiety.  6. Gastroesophageal reflux disease.  7. Hyperlipidemia.  8. History of mitral valve prolapse.   PROCEDURES AND STUDIES:  1. Abdominal ultrasound - normal appearance of the abdomen.  2. MRI - acute intracranial abnormality.  Scattered subcortical T2      hyperintensities, slightly greater in number than expected for age.      The finding is nonspecific, but can be seen in the setting of      chronic microvascular ischemia, a demyelinating process such as      multiple sclerosis, vasculitis, complicating migraine headaches, or      sequela of prior infectious or inflammatory process.  3. MRA of the brain - a 6 x 2 x 5 mm left MCA bifurcation.  4. MRA of the neck - A 6 mm left MCA bifurcation aneurysm.  No      significant carotid or vertebral artery disease proximally.      Irregular appearance of the mid cervical internal carotid artery,      right worse than left, suggestive of fibromuscular dysplasia.  5. Carotid angiogram - approximately 5.5 mm x 5 mm left MCA cerebral      artery region aneurysm, mild atherosclerotic disease involving the    right carotid bulb.  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  6. Endovascular obliteration of left middle cerebral artery region      aneurysm - per Dr. Corliss Skains, on April 21, 2008.  Status post      staged endovascular treatment of relatively wide middle cerebral      artery bifurcation aneurysm with placement of endovascular stent.      Placement of coils within the aneurysm stopped due to meeting      instability of the stent per Dr. Corliss Skains.  7. A 2D echocardiogram - normal systolic function, cavity size and      wall thickness are normal.  Images inadequate for LV wall motion      assessment.   CONSULTATIONS:  1. Neurosurgery.  2. Gastroenterology, Dr. Dorena Cookey.  3. Interventional radiology, Dr. Corliss Skains.   BRIEF HISTORY:  1. The patient is a 65 year old white female with above listed medical      problems who presented with complaints of chest pain.  She      describes the pain as a pressure with radiation to her right      shoulder and bilateral jaw tightness.  She had associated      diaphoresis, nausea, shortness of breath, and belching.  She was      seen in the ED and given nitroglycerin with resolution of her chest      pain.  She was admitted for further evaluation and management.  It      was noted that she had a Cardiolite stress test in December of 2009      and it was negative.  She was admitted for further evaluation and      management.  Please see the full admission history and physical      dictated on April 15, 2008 by Dr. Janee Morn for details of the      admission physical exam, as well as the laboratory data.  Chest      pain, sa discussed above.  Serial cardiac enzymes were obtained      upon admission.  A 2D echocardiogram was obtained and it showed      that the systolic function was normal and the left ventricular      cavity size was normal, as well as  the wall thickness.  The images      were reported to be inadequate for LV wall motion assessment.      Cardiac enzymes came back negative.  GI was subsequently consulted      for preoperative cardiovascular risk assessment, as she was due to      have coiling of the aneurysm.  Dr. Anne Fu saw the patient and since      she had recently had a nuclear stress test, which was low risk,      showing no evidence of ischemia with a normal ejection fraction and      her echocardiogram already discussed above showed no wall motion      abnormalities.  He recommended to continue to monitor her blood      pressure closely as she was on multiple antihypertensive agents.      She had a barium swallow done and it showed nonspecific esophageal      dysmotility disorder with obstruction of all primary peristaltic      waves.  A 13 mm barium tablet was noted to be sticking in the      distal esophageus despite the lack of visible stricture and it      reproduced the patient's symptoms.  GI was consulted following this      and they recommended an antispasmodic agent - Levsin p.r.n.  There      was also concern for possible biliary dyskinesia by GI given her      symptoms, and they recommended a HIDA scan prior to discharge.      This was scheduled but interventional radiology, Dr. Corliss Skains,      stated that he wanted the patient to wait 6 hours after the sheath      was removed before the HIDA scan was done.  The patient then decide      that she would rather have the HIDA scan done outpatient and she      was to follow up with GI outpatient as well.  Gastroenterology  indicated that the patient might require dilatation outpatient.      The impression is that her chest pain was secondary to GI etiology      and she was to follow up with [facility] outpatient.  2. Left middle cerebral artery bifurcation aneurysm.  The patient      complained of headaches while in the hospital and workup  included a      CT scan, MRI, and MRA, and the results are as stated above.  The      MRA revealed the aneurysm and neurosurgery was consulted, and they      saw the patient and discussed the findings with interventional      radiology, and they indicated that this was a coilable aneurysm.      Following this, Dr. Corliss Skains, on April 21, 2008, did a staged      endovascular treatment of the aneurysm with placement of      endovascular stent.  Placement of the coils within the aneurysm was      stopped as Dr. Corliss Skains met with instability of the stent.  The      patient was on heparin during her hospital stay.  She was      discharged on Plavix per interventional radiology for 2 to [redacted] weeks      along with aspirin, and she was to continue the aspirin 81 mg      daily.  3. Hypertension.  She was maintained on her outpatient medications      during her hospital stay and her blood pressures were monitored      closely.  She was to continue her preadmission medications upon      discharge.  4. Hypokalemia.  Potassium was replaced during the hospital stay.   DISCHARGE MEDICATIONS:  1. Plavix 75 mg p.o. daily for 2 to 4 weeks.  2. Aspirin 81 mg p.o. daily.  3. K-Dur daily as directed.  4. The patient to continue Nexium 40 mg daily.  5. Vytorin 10/40 daily.  6. Estradiol 1 mg daily.  7. Nadolol 40 mg daily.  8. Effexor 150 mg daily.  9. Micardis hydrochlorothiazide 80 mg daily.  10.Allegra 180 mg p.r.n.  11.Xanax 0.25 mg p.r.n.   FOLLOW UP:  1. Dr. Corliss Skains in 2 weeks.  2. Dr. Arvilla Market as scheduled.  3. [facility].  The patient to call for an appointment at      [phone].  4. Special instructions per Dr. Corliss Skains.  No driving for 2 weeks.      No heavy lifting or strenuous exercise for 2 weeks.  No bending,      stooping, or extreme movements of the legs from side to side for 2      weeks.  Also, may return to work in 2 weeks.   CONDITION ON DISCHARGE:  Improved and  stable.      Kela Millin, M.D.  Electronically Signed     ACV/MEDQ  D:  07/22/2008  T:  07/22/2008  Job:  [id number]   cc:   Sanjeev K. Corliss Skains, M.D.  Donia Guiles, M.D.  John C. Madilyn Fireman, M.D.

## 2010-10-05 ENCOUNTER — Ambulatory Visit (HOSPITAL_BASED_OUTPATIENT_CLINIC_OR_DEPARTMENT_OTHER)
Admission: RE | Admit: 2010-10-05 | Payer: Federal, State, Local not specified - PPO | Source: Ambulatory Visit | Admitting: Orthopedic Surgery

## 2010-11-16 ENCOUNTER — Ambulatory Visit (HOSPITAL_BASED_OUTPATIENT_CLINIC_OR_DEPARTMENT_OTHER)
Admission: RE | Admit: 2010-11-16 | Payer: Federal, State, Local not specified - PPO | Source: Ambulatory Visit | Admitting: Orthopedic Surgery

## 2010-11-16 SURGERY — RELEASE, A1 PULLEY, FOR TRIGGER FINGER
Anesthesia: Choice | Laterality: Left

## 2011-06-06 DIAGNOSIS — M79609 Pain in unspecified limb: Secondary | ICD-10-CM | POA: Diagnosis not present

## 2011-06-06 DIAGNOSIS — S62309A Unspecified fracture of unspecified metacarpal bone, initial encounter for closed fracture: Secondary | ICD-10-CM | POA: Diagnosis not present

## 2011-06-13 ENCOUNTER — Ambulatory Visit: Payer: Self-pay | Admitting: Sports Medicine

## 2011-06-13 DIAGNOSIS — S62319A Displaced fracture of base of unspecified metacarpal bone, initial encounter for closed fracture: Secondary | ICD-10-CM | POA: Diagnosis not present

## 2011-06-13 DIAGNOSIS — M25649 Stiffness of unspecified hand, not elsewhere classified: Secondary | ICD-10-CM | POA: Diagnosis not present

## 2011-06-13 DIAGNOSIS — I10 Essential (primary) hypertension: Secondary | ICD-10-CM | POA: Diagnosis not present

## 2011-06-13 DIAGNOSIS — M25549 Pain in joints of unspecified hand: Secondary | ICD-10-CM | POA: Diagnosis not present

## 2011-06-13 DIAGNOSIS — S62309A Unspecified fracture of unspecified metacarpal bone, initial encounter for closed fracture: Secondary | ICD-10-CM | POA: Diagnosis not present

## 2011-06-13 DIAGNOSIS — M79609 Pain in unspecified limb: Secondary | ICD-10-CM | POA: Diagnosis not present

## 2011-06-15 DIAGNOSIS — I10 Essential (primary) hypertension: Secondary | ICD-10-CM | POA: Diagnosis not present

## 2011-06-15 DIAGNOSIS — M25549 Pain in joints of unspecified hand: Secondary | ICD-10-CM | POA: Diagnosis not present

## 2011-06-15 DIAGNOSIS — S62309A Unspecified fracture of unspecified metacarpal bone, initial encounter for closed fracture: Secondary | ICD-10-CM | POA: Diagnosis not present

## 2011-06-15 DIAGNOSIS — M25649 Stiffness of unspecified hand, not elsewhere classified: Secondary | ICD-10-CM | POA: Diagnosis not present

## 2011-06-28 DIAGNOSIS — M810 Age-related osteoporosis without current pathological fracture: Secondary | ICD-10-CM | POA: Diagnosis not present

## 2011-06-28 DIAGNOSIS — R350 Frequency of micturition: Secondary | ICD-10-CM | POA: Diagnosis not present

## 2011-06-28 DIAGNOSIS — Z1211 Encounter for screening for malignant neoplasm of colon: Secondary | ICD-10-CM | POA: Diagnosis not present

## 2011-06-28 DIAGNOSIS — Z23 Encounter for immunization: Secondary | ICD-10-CM | POA: Diagnosis not present

## 2011-06-28 DIAGNOSIS — I10 Essential (primary) hypertension: Secondary | ICD-10-CM | POA: Diagnosis not present

## 2011-06-28 DIAGNOSIS — F411 Generalized anxiety disorder: Secondary | ICD-10-CM | POA: Diagnosis not present

## 2011-06-28 DIAGNOSIS — E7800 Pure hypercholesterolemia, unspecified: Secondary | ICD-10-CM | POA: Diagnosis not present

## 2011-06-28 DIAGNOSIS — Z0000 Encounter for general adult medical examination without abnormal findings: Secondary | ICD-10-CM | POA: Diagnosis not present

## 2011-07-04 DIAGNOSIS — M25649 Stiffness of unspecified hand, not elsewhere classified: Secondary | ICD-10-CM | POA: Diagnosis not present

## 2011-07-04 DIAGNOSIS — M25549 Pain in joints of unspecified hand: Secondary | ICD-10-CM | POA: Diagnosis not present

## 2011-07-04 DIAGNOSIS — S62309A Unspecified fracture of unspecified metacarpal bone, initial encounter for closed fracture: Secondary | ICD-10-CM | POA: Diagnosis not present

## 2011-07-04 DIAGNOSIS — I10 Essential (primary) hypertension: Secondary | ICD-10-CM | POA: Diagnosis not present

## 2011-12-28 DIAGNOSIS — I10 Essential (primary) hypertension: Secondary | ICD-10-CM | POA: Diagnosis not present

## 2011-12-28 DIAGNOSIS — E7800 Pure hypercholesterolemia, unspecified: Secondary | ICD-10-CM | POA: Diagnosis not present

## 2011-12-28 DIAGNOSIS — Z23 Encounter for immunization: Secondary | ICD-10-CM | POA: Diagnosis not present

## 2011-12-28 DIAGNOSIS — F411 Generalized anxiety disorder: Secondary | ICD-10-CM | POA: Diagnosis not present

## 2012-02-23 DIAGNOSIS — H2510 Age-related nuclear cataract, unspecified eye: Secondary | ICD-10-CM | POA: Diagnosis not present

## 2012-03-01 DIAGNOSIS — D485 Neoplasm of uncertain behavior of skin: Secondary | ICD-10-CM | POA: Diagnosis not present

## 2012-03-01 DIAGNOSIS — B351 Tinea unguium: Secondary | ICD-10-CM | POA: Diagnosis not present

## 2012-03-01 DIAGNOSIS — M79609 Pain in unspecified limb: Secondary | ICD-10-CM | POA: Diagnosis not present

## 2012-04-12 DIAGNOSIS — J301 Allergic rhinitis due to pollen: Secondary | ICD-10-CM | POA: Diagnosis not present

## 2012-04-12 DIAGNOSIS — J45909 Unspecified asthma, uncomplicated: Secondary | ICD-10-CM | POA: Diagnosis not present

## 2012-04-12 DIAGNOSIS — Z91040 Latex allergy status: Secondary | ICD-10-CM | POA: Diagnosis not present

## 2012-04-26 DIAGNOSIS — R5383 Other fatigue: Secondary | ICD-10-CM | POA: Diagnosis not present

## 2012-04-26 DIAGNOSIS — R5381 Other malaise: Secondary | ICD-10-CM | POA: Diagnosis not present

## 2012-04-26 DIAGNOSIS — I729 Aneurysm of unspecified site: Secondary | ICD-10-CM | POA: Diagnosis not present

## 2012-04-26 DIAGNOSIS — R0989 Other specified symptoms and signs involving the circulatory and respiratory systems: Secondary | ICD-10-CM | POA: Diagnosis not present

## 2012-04-26 DIAGNOSIS — I10 Essential (primary) hypertension: Secondary | ICD-10-CM | POA: Diagnosis not present

## 2012-04-26 DIAGNOSIS — R0609 Other forms of dyspnea: Secondary | ICD-10-CM | POA: Diagnosis not present

## 2012-05-03 DIAGNOSIS — R0989 Other specified symptoms and signs involving the circulatory and respiratory systems: Secondary | ICD-10-CM | POA: Diagnosis not present

## 2012-05-03 DIAGNOSIS — R0609 Other forms of dyspnea: Secondary | ICD-10-CM | POA: Diagnosis not present

## 2012-06-10 DIAGNOSIS — I519 Heart disease, unspecified: Secondary | ICD-10-CM | POA: Diagnosis not present

## 2012-06-10 DIAGNOSIS — K219 Gastro-esophageal reflux disease without esophagitis: Secondary | ICD-10-CM | POA: Diagnosis not present

## 2012-06-10 DIAGNOSIS — E7800 Pure hypercholesterolemia, unspecified: Secondary | ICD-10-CM | POA: Diagnosis not present

## 2012-06-10 DIAGNOSIS — I10 Essential (primary) hypertension: Secondary | ICD-10-CM | POA: Diagnosis not present

## 2012-07-11 DIAGNOSIS — N644 Mastodynia: Secondary | ICD-10-CM | POA: Diagnosis not present

## 2012-07-12 DIAGNOSIS — R922 Inconclusive mammogram: Secondary | ICD-10-CM | POA: Diagnosis not present

## 2012-07-12 DIAGNOSIS — N644 Mastodynia: Secondary | ICD-10-CM | POA: Diagnosis not present

## 2012-07-12 DIAGNOSIS — R928 Other abnormal and inconclusive findings on diagnostic imaging of breast: Secondary | ICD-10-CM | POA: Diagnosis not present

## 2012-08-22 DIAGNOSIS — R51 Headache: Secondary | ICD-10-CM | POA: Diagnosis not present

## 2012-09-11 ENCOUNTER — Other Ambulatory Visit: Payer: Self-pay | Admitting: Family Medicine

## 2012-09-11 DIAGNOSIS — E7800 Pure hypercholesterolemia, unspecified: Secondary | ICD-10-CM | POA: Diagnosis not present

## 2012-09-11 DIAGNOSIS — Z0000 Encounter for general adult medical examination without abnormal findings: Secondary | ICD-10-CM | POA: Diagnosis not present

## 2012-09-11 DIAGNOSIS — M810 Age-related osteoporosis without current pathological fracture: Secondary | ICD-10-CM | POA: Diagnosis not present

## 2012-09-11 DIAGNOSIS — F329 Major depressive disorder, single episode, unspecified: Secondary | ICD-10-CM | POA: Diagnosis not present

## 2012-09-11 DIAGNOSIS — I519 Heart disease, unspecified: Secondary | ICD-10-CM | POA: Diagnosis not present

## 2012-09-11 DIAGNOSIS — G43909 Migraine, unspecified, not intractable, without status migrainosus: Secondary | ICD-10-CM

## 2012-09-11 DIAGNOSIS — K219 Gastro-esophageal reflux disease without esophagitis: Secondary | ICD-10-CM | POA: Diagnosis not present

## 2012-09-11 DIAGNOSIS — Z23 Encounter for immunization: Secondary | ICD-10-CM | POA: Diagnosis not present

## 2012-09-11 DIAGNOSIS — I110 Hypertensive heart disease with heart failure: Secondary | ICD-10-CM | POA: Diagnosis not present

## 2012-09-11 DIAGNOSIS — N951 Menopausal and female climacteric states: Secondary | ICD-10-CM | POA: Diagnosis not present

## 2012-09-11 DIAGNOSIS — I729 Aneurysm of unspecified site: Secondary | ICD-10-CM

## 2012-09-12 ENCOUNTER — Other Ambulatory Visit (HOSPITAL_COMMUNITY): Payer: Self-pay | Admitting: Interventional Radiology

## 2012-09-12 DIAGNOSIS — G43909 Migraine, unspecified, not intractable, without status migrainosus: Secondary | ICD-10-CM

## 2012-09-12 DIAGNOSIS — I729 Aneurysm of unspecified site: Secondary | ICD-10-CM

## 2012-09-15 ENCOUNTER — Other Ambulatory Visit: Payer: Federal, State, Local not specified - PPO

## 2012-09-16 ENCOUNTER — Ambulatory Visit
Admission: RE | Admit: 2012-09-16 | Discharge: 2012-09-16 | Disposition: A | Discharge status: Home / Self Care | Payer: Medicare Other | Source: Ambulatory Visit | Attending: Family Medicine | Admitting: Family Medicine

## 2012-09-16 DIAGNOSIS — G43909 Migraine, unspecified, not intractable, without status migrainosus: Secondary | ICD-10-CM

## 2012-09-16 DIAGNOSIS — I729 Aneurysm of unspecified site: Secondary | ICD-10-CM

## 2012-09-16 DIAGNOSIS — I672 Cerebral atherosclerosis: Secondary | ICD-10-CM | POA: Diagnosis not present

## 2012-09-16 MED ORDER — GADOBENATE DIMEGLUMINE 529 MG/ML IV SOLN
11.0000 mL | Freq: Once | INTRAVENOUS | Status: AC | PRN
  Administered 2012-09-16: 11 mL via INTRAVENOUS

## 2012-09-18 ENCOUNTER — Other Ambulatory Visit: Payer: Federal, State, Local not specified - PPO

## 2012-09-20 ENCOUNTER — Ambulatory Visit (HOSPITAL_COMMUNITY)
Admission: RE | Admit: 2012-09-20 | Discharge: 2012-09-20 | Disposition: A | Discharge status: Home / Self Care | Payer: Federal, State, Local not specified - PPO | Source: Ambulatory Visit | Attending: Interventional Radiology | Admitting: Interventional Radiology

## 2012-09-20 DIAGNOSIS — G43909 Migraine, unspecified, not intractable, without status migrainosus: Secondary | ICD-10-CM

## 2012-09-20 DIAGNOSIS — I671 Cerebral aneurysm, nonruptured: Secondary | ICD-10-CM | POA: Diagnosis not present

## 2012-09-20 DIAGNOSIS — I729 Aneurysm of unspecified site: Secondary | ICD-10-CM

## 2012-09-27 ENCOUNTER — Other Ambulatory Visit (HOSPITAL_COMMUNITY): Payer: Self-pay | Admitting: Interventional Radiology

## 2012-09-27 ENCOUNTER — Telehealth (HOSPITAL_COMMUNITY): Payer: Self-pay | Admitting: Interventional Radiology

## 2012-09-27 DIAGNOSIS — I729 Aneurysm of unspecified site: Secondary | ICD-10-CM

## 2012-09-27 NOTE — Telephone Encounter (Signed)
Called pt's daughter to schedule procedure with Dr. Corliss Skains. Pt's daughter was extremely polite and cooperative and easy to deal with. She stated that she really hated she had been so upset this morning but was just overly concerned about her mother's aneurysm possibly rupturing. Mother has had recurrent h/a's every day this week and this has caused them to be very stressed. She was in complete agreement and satisfied with the appointment date and time. I asked her if she had any further questions or concerns and she states that she does not. I told her if she did to please call us back and if she did not get me as quickly as she would like to please feel free to contact the [phone] phone number and ask to speak to [facility]. I assured her that we would do all that we could to make sure her mother was taken care of and she stated she was very appreciative of that and of my calling her.

## 2012-09-30 ENCOUNTER — Other Ambulatory Visit: Payer: Self-pay | Admitting: Radiology

## 2012-10-02 ENCOUNTER — Encounter (HOSPITAL_COMMUNITY): Payer: Self-pay | Admitting: Pharmacy Technician

## 2012-10-03 ENCOUNTER — Other Ambulatory Visit (HOSPITAL_COMMUNITY): Payer: Self-pay | Admitting: *Deleted

## 2012-10-03 ENCOUNTER — Other Ambulatory Visit: Payer: Self-pay | Admitting: Radiology

## 2012-10-03 NOTE — Pre-Procedure Instructions (Signed)
Amber Brooks  10/03/2012   Your procedure is scheduled on:  Wednesday, October 09, 2012 at 8:30 AM.   Report to [facility] Main Entrance "A" at 6:30 AM.   Call this number if you have problems the morning of surgery: [phone]   Remember:   Do not eat food or drink liquids after midnight Tuesday, 10/08/12.   Take these medicines the morning of surgery with A SIP OF WATER: esomeprazole (NEXIUM), nadolol (CORGARD), venlafaxine [facility])   Stop all Vitamins, Herbal medications, Fish Oil, Aspirin and NSAIDS (Ibuprofen, Motrin, Aleve, Naproxen, etc) as of today, 10/04/12.     Do not wear jewelry, make-up or nail polish.  Do not wear lotions, powders, or perfumes. You may wear deodorant.  Do not shave 48 hours prior to surgery.   Do not bring valuables to the hospital.  [facility] is not responsible                  for any belongings or valuables.               Contacts, dentures or bridgework may not be worn into surgery.  Leave suitcase in the car. After surgery it may be brought to your room.  For patients admitted to the hospital, discharge time is determined by your                treatment team.               Special Instructions: Shower using CHG 2 nights before surgery and the night before surgery.  If you shower the day of surgery use CHG.  Use special wash - you have one bottle of CHG for all showers.  You should use approximately 1/3 of the bottle for each shower.   Please read over the following fact sheets that you were given: Pain Booklet, Coughing and Deep Breathing and Surgical Site Infection Prevention

## 2012-10-04 ENCOUNTER — Ambulatory Visit (HOSPITAL_COMMUNITY)
Admission: RE | Admit: 2012-10-04 | Discharge: 2012-10-04 | Disposition: A | Discharge status: Home / Self Care | Payer: Medicare Other | Source: Ambulatory Visit | Attending: Interventional Radiology | Admitting: Interventional Radiology

## 2012-10-04 ENCOUNTER — Encounter (HOSPITAL_COMMUNITY): Payer: Self-pay

## 2012-10-04 ENCOUNTER — Encounter (HOSPITAL_COMMUNITY)
Admission: RE | Admit: 2012-10-04 | Discharge: 2012-10-04 | Disposition: A | Discharge status: Home / Self Care | Payer: Medicare Other | Source: Ambulatory Visit | Attending: Interventional Radiology | Admitting: Interventional Radiology

## 2012-10-04 DIAGNOSIS — I671 Cerebral aneurysm, nonruptured: Secondary | ICD-10-CM | POA: Insufficient documentation

## 2012-10-04 DIAGNOSIS — Z01818 Encounter for other preprocedural examination: Secondary | ICD-10-CM | POA: Insufficient documentation

## 2012-10-04 DIAGNOSIS — Z01812 Encounter for preprocedural laboratory examination: Secondary | ICD-10-CM | POA: Diagnosis not present

## 2012-10-04 HISTORY — DX: Anxiety disorder, unspecified: F41.9

## 2012-10-04 HISTORY — DX: Adverse effect of unspecified anesthetic, initial encounter: T41.45XA

## 2012-10-04 HISTORY — DX: Other complications of anesthesia, initial encounter: T88.59XA

## 2012-10-04 HISTORY — DX: Headache: R51

## 2012-10-04 HISTORY — DX: Other specified postprocedural states: Z98.890

## 2012-10-04 HISTORY — DX: Gastro-esophageal reflux disease without esophagitis: K21.9

## 2012-10-04 HISTORY — DX: Nausea with vomiting, unspecified: R11.2

## 2012-10-04 LAB — COMPREHENSIVE METABOLIC PANEL
AST: 27 U/L (ref 0–37)
BUN: 21 mg/dL (ref 6–23)
CO2: 27 mEq/L (ref 19–32)
Chloride: 101 mEq/L (ref 96–112)
Creatinine, Ser: 0.79 mg/dL (ref 0.50–1.10)
GFR calc non Af Amer: 85 mL/min — ABNORMAL LOW (ref >90)
Potassium: 3.8 mEq/L (ref 3.5–5.1)
Sodium: 138 mEq/L (ref 135–145)
Total Bilirubin: 0.3 mg/dL (ref 0.3–1.2)
Total Protein: 7.5 g/dL (ref 6.0–8.3)

## 2012-10-04 LAB — CBC WITH DIFFERENTIAL/PLATELET
Basophils Absolute: 0 10*3/uL (ref 0.0–0.1)
Basophils Relative: 1 % (ref 0–1)
HCT: 40.8 % (ref 36.0–46.0)
Hemoglobin: 13.6 g/dL (ref 12.0–15.0)
Lymphocytes Relative: 29 % (ref 12–46)
MCHC: 33.3 g/dL (ref 30.0–36.0)
Monocytes Absolute: 0.5 10*3/uL (ref 0.1–1.0)
Monocytes Relative: 7 % (ref 3–12)
Neutro Abs: 3.8 10*3/uL (ref 1.7–7.7)
Platelets: 235 10*3/uL (ref 150–400)
RBC: 4.52 MIL/uL (ref 3.87–5.11)
WBC: 6.8 10*3/uL (ref 4.0–10.5)

## 2012-10-04 LAB — APTT: aPTT: 26 seconds (ref 24–37)

## 2012-10-04 LAB — PROTIME-INR: INR: 0.96 (ref 0.00–1.49)

## 2012-10-04 NOTE — Progress Notes (Addendum)
Requested Echo & Ekg from her PCP. She last saw Dr. Garnette Scheuermann about 1 month ago...Marland KitchenDA To Anesthesia--patient would like to have the arterial line placed in wrist after she is asleep.  Had a BAD experience last time.  Thanks.  DA

## 2012-10-09 ENCOUNTER — Encounter (HOSPITAL_COMMUNITY): Payer: Self-pay

## 2012-10-09 ENCOUNTER — Encounter (HOSPITAL_COMMUNITY): Payer: Medicare Other | Admitting: Anesthesiology

## 2012-10-09 ENCOUNTER — Ambulatory Visit (HOSPITAL_COMMUNITY): Payer: Medicare Other | Admitting: Anesthesiology

## 2012-10-09 ENCOUNTER — Ambulatory Visit (HOSPITAL_COMMUNITY)
Admission: RE | Admit: 2012-10-09 | Discharge: 2012-10-09 | Disposition: A | Discharge status: Home / Self Care | Payer: Medicare Other | Source: Ambulatory Visit | Attending: Interventional Radiology | Admitting: Interventional Radiology

## 2012-10-09 ENCOUNTER — Encounter (HOSPITAL_COMMUNITY)
Admission: RE | Disposition: A | Discharge status: Home / Self Care | Payer: Self-pay | Source: Ambulatory Visit | Attending: Interventional Radiology

## 2012-10-09 ENCOUNTER — Encounter (HOSPITAL_COMMUNITY): Payer: Self-pay | Admitting: Anesthesiology

## 2012-10-09 ENCOUNTER — Inpatient Hospital Stay (HOSPITAL_COMMUNITY)
Admission: RE | Admit: 2012-10-09 | Discharge: 2012-10-10 | DRG: 027 | Disposition: A | Discharge status: Home / Self Care | Payer: Medicare Other | Source: Ambulatory Visit | Attending: Interventional Radiology | Admitting: Interventional Radiology

## 2012-10-09 VITALS — BP 159/73 | HR 61 | Temp 97.1°F | Resp 20

## 2012-10-09 DIAGNOSIS — I671 Cerebral aneurysm, nonruptured: Secondary | ICD-10-CM

## 2012-10-09 DIAGNOSIS — R51 Headache: Secondary | ICD-10-CM | POA: Diagnosis not present

## 2012-10-09 DIAGNOSIS — G43909 Migraine, unspecified, not intractable, without status migrainosus: Secondary | ICD-10-CM | POA: Diagnosis present

## 2012-10-09 DIAGNOSIS — I729 Aneurysm of unspecified site: Secondary | ICD-10-CM

## 2012-10-09 HISTORY — PX: RADIOLOGY WITH ANESTHESIA: SHX6223

## 2012-10-09 SURGERY — RADIOLOGY WITH ANESTHESIA
Anesthesia: General

## 2012-10-09 MED ORDER — DEXAMETHASONE SODIUM PHOSPHATE 10 MG/ML IJ SOLN
10.0000 mg | Freq: Once | INTRAMUSCULAR | Status: AC
  Administered 2012-10-09: 10 mg via INTRAVENOUS

## 2012-10-09 MED ORDER — ALPRAZOLAM 0.5 MG PO TABS
0.5000 mg | ORAL_TABLET | Freq: Every evening | ORAL | Status: DC | PRN
  Administered 2012-10-09: 0.5 mg via ORAL
  Filled 2012-10-09: qty 1

## 2012-10-09 MED ORDER — ESTRADIOL 1 MG PO TABS: 0.5000 mg | ORAL_TABLET | ORAL | Status: DC

## 2012-10-09 MED ORDER — PANTOPRAZOLE SODIUM 40 MG PO TBEC: 40.0000 mg | DELAYED_RELEASE_TABLET | Freq: Every day | ORAL | Status: DC

## 2012-10-09 MED ORDER — FENTANYL CITRATE 0.05 MG/ML IJ SOLN
INTRAMUSCULAR | Status: AC
  Filled 2012-10-09: qty 2

## 2012-10-09 MED ORDER — ALPRAZOLAM 0.5 MG PO TABS: 0.5000 mg | ORAL_TABLET | Freq: Every evening | ORAL | Status: DC | PRN

## 2012-10-09 MED ORDER — EZETIMIBE-SIMVASTATIN 10-40 MG PO TABS
1.0000 | ORAL_TABLET | Freq: Every day | ORAL | Status: DC
  Administered 2012-10-09 – 2012-10-10 (×2): 1 via ORAL
  Filled 2012-10-09 (×2): qty 1

## 2012-10-09 MED ORDER — HYDROMORPHONE HCL PF 1 MG/ML IJ SOLN
0.2500 mg | INTRAMUSCULAR | Status: DC | PRN
Start: 2012-10-09 — End: 2012-10-09
  Administered 2012-10-09 (×2): 0.5 mg via INTRAVENOUS

## 2012-10-09 MED ORDER — SODIUM CHLORIDE 0.9 % IV SOLN: INTRAVENOUS | Status: DC

## 2012-10-09 MED ORDER — ASPIRIN 81 MG PO CHEW
81.0000 mg | CHEWABLE_TABLET | Freq: Every day | ORAL | Status: DC
  Administered 2012-10-09 – 2012-10-10 (×2): 81 mg via ORAL
  Filled 2012-10-09 (×2): qty 1

## 2012-10-09 MED ORDER — MIDAZOLAM HCL 5 MG/5ML IJ SOLN
INTRAMUSCULAR | Status: DC | PRN
  Administered 2012-10-09: .5 mg via INTRAVENOUS
  Administered 2012-10-09: 1 mg via INTRAVENOUS

## 2012-10-09 MED ORDER — ASPIRIN 81 MG PO CHEW: 81.0000 mg | CHEWABLE_TABLET | Freq: Every day | ORAL | Status: DC

## 2012-10-09 MED ORDER — HEPARIN SODIUM (PORCINE) 1000 UNIT/ML IJ SOLN
INTRAMUSCULAR | Status: DC | PRN
  Administered 2012-10-09: 500 [IU] via INTRAVENOUS
  Administered 2012-10-09: 3000 [IU] via INTRAVENOUS
  Administered 2012-10-09: 1000 [IU] via INTRAVENOUS

## 2012-10-09 MED ORDER — NICARDIPINE HCL IN NACL 20-0.86 MG/200ML-% IV SOLN: 5.0000 mg/h | INTRAVENOUS | Status: DC

## 2012-10-09 MED ORDER — NIMODIPINE 30 MG PO CAPS
60.0000 mg | ORAL_CAPSULE | ORAL | Status: AC
  Administered 2012-10-09: 60 mg via ORAL

## 2012-10-09 MED ORDER — KETOROLAC TROMETHAMINE 30 MG/ML IJ SOLN
30.0000 mg | Freq: Three times a day (TID) | INTRAMUSCULAR | Status: DC | PRN
  Administered 2012-10-10 (×2): 30 mg via INTRAVENOUS
  Filled 2012-10-09 (×2): qty 1

## 2012-10-09 MED ORDER — NITROGLYCERIN 1 MG/10 ML FOR IR/CATH LAB
INTRA_ARTERIAL | Status: AC
  Filled 2012-10-09: qty 10

## 2012-10-09 MED ORDER — PANTOPRAZOLE SODIUM 40 MG PO TBEC
40.0000 mg | DELAYED_RELEASE_TABLET | Freq: Every day | ORAL | Status: DC
  Administered 2012-10-10: 40 mg via ORAL
  Filled 2012-10-09: qty 1

## 2012-10-09 MED ORDER — VENLAFAXINE HCL 75 MG PO TABS
75.0000 mg | ORAL_TABLET | Freq: Two times a day (BID) | ORAL | Status: DC
  Administered 2012-10-09 – 2012-10-10 (×2): 75 mg via ORAL
  Filled 2012-10-09 (×4): qty 1

## 2012-10-09 MED ORDER — FENTANYL CITRATE 0.05 MG/ML IJ SOLN
INTRAMUSCULAR | Status: DC | PRN
  Administered 2012-10-09 (×4): 25 ug via INTRAVENOUS

## 2012-10-09 MED ORDER — PROPOFOL 10 MG/ML IV BOLUS
INTRAVENOUS | Status: DC | PRN
  Administered 2012-10-09: 100 mg via INTRAVENOUS

## 2012-10-09 MED ORDER — ASPIRIN EC 325 MG PO TBEC
DELAYED_RELEASE_TABLET | ORAL | Status: AC
  Administered 2012-10-09: 325 mg via ORAL
  Filled 2012-10-09: qty 1

## 2012-10-09 MED ORDER — ONDANSETRON HCL 4 MG/2ML IJ SOLN
4.0000 mg | Freq: Four times a day (QID) | INTRAMUSCULAR | Status: DC | PRN
  Administered 2012-10-09 – 2012-10-10 (×2): 4 mg via INTRAVENOUS
  Filled 2012-10-09 (×2): qty 2

## 2012-10-09 MED ORDER — CEFAZOLIN SODIUM-DEXTROSE 2-3 GM-% IV SOLR
2.0000 g | Freq: Once | INTRAVENOUS | Status: AC
  Administered 2012-10-09: 2 g via INTRAVENOUS

## 2012-10-09 MED ORDER — LABETALOL HCL 5 MG/ML IV SOLN
INTRAVENOUS | Status: DC | PRN
  Administered 2012-10-09 (×2): 10 mg via INTRAVENOUS

## 2012-10-09 MED ORDER — IOHEXOL 300 MG/ML  SOLN
150.0000 mL | Freq: Once | INTRAMUSCULAR | Status: AC | PRN
  Administered 2012-10-09: 1 mL via INTRAVENOUS

## 2012-10-09 MED ORDER — NADOLOL 40 MG PO TABS: 40.0000 mg | ORAL_TABLET | Freq: Every day | ORAL | Status: DC

## 2012-10-09 MED ORDER — FENTANYL CITRATE 0.05 MG/ML IJ SOLN
25.0000 ug | INTRAMUSCULAR | Status: DC | PRN
  Administered 2012-10-09 (×2): 25 ug via INTRAVENOUS
  Filled 2012-10-09: qty 2

## 2012-10-09 MED ORDER — KETOROLAC TROMETHAMINE 30 MG/ML IJ SOLN
INTRAMUSCULAR | Status: DC | PRN
  Administered 2012-10-09: 30 mg via INTRAVENOUS

## 2012-10-09 MED ORDER — ESTRADIOL 1 MG PO TABS
0.5000 mg | ORAL_TABLET | ORAL | Status: DC
  Filled 2012-10-09: qty 0.5

## 2012-10-09 MED ORDER — GLYCOPYRROLATE 0.2 MG/ML IJ SOLN
INTRAMUSCULAR | Status: DC | PRN
  Administered 2012-10-09: 0.6 mg via INTRAVENOUS

## 2012-10-09 MED ORDER — NIMODIPINE 30 MG PO CAPS
ORAL_CAPSULE | ORAL | Status: AC
  Filled 2012-10-09: qty 1

## 2012-10-09 MED ORDER — NEOSTIGMINE METHYLSULFATE 1 MG/ML IJ SOLN
INTRAMUSCULAR | Status: DC | PRN
  Administered 2012-10-09: 3 mg via INTRAVENOUS

## 2012-10-09 MED ORDER — VENLAFAXINE HCL 75 MG PO TABS: 75.0000 mg | ORAL_TABLET | Freq: Two times a day (BID) | ORAL | Status: DC

## 2012-10-09 MED ORDER — FENTANYL CITRATE 0.05 MG/ML IJ SOLN
INTRAMUSCULAR | Status: AC
Start: 2012-10-09 — End: 2012-10-10
  Filled 2012-10-09: qty 2

## 2012-10-09 MED ORDER — SODIUM CHLORIDE 0.9 % IV SOLN: Freq: Once | INTRAVENOUS | Status: DC

## 2012-10-09 MED ORDER — PROTAMINE SULFATE 10 MG/ML IV SOLN
INTRAVENOUS | Status: DC | PRN
  Administered 2012-10-09: 5 mg via INTRAVENOUS
  Administered 2012-10-09: 10 mg via INTRAVENOUS

## 2012-10-09 MED ORDER — ACETAMINOPHEN 650 MG RE SUPP: 650.0000 mg | Freq: Four times a day (QID) | RECTAL | Status: DC | PRN

## 2012-10-09 MED ORDER — HYDROMORPHONE HCL PF 1 MG/ML IJ SOLN
INTRAMUSCULAR | Status: AC
  Filled 2012-10-09: qty 1

## 2012-10-09 MED ORDER — KETOROLAC TROMETHAMINE 30 MG/ML IJ SOLN: 30.0000 mg | Freq: Four times a day (QID) | INTRAMUSCULAR | Status: DC | PRN

## 2012-10-09 MED ORDER — ACETAMINOPHEN 500 MG PO TABS
1000.0000 mg | ORAL_TABLET | Freq: Four times a day (QID) | ORAL | Status: DC | PRN
  Administered 2012-10-09: 1000 mg via ORAL
  Filled 2012-10-09: qty 2

## 2012-10-09 MED ORDER — CEFAZOLIN SODIUM-DEXTROSE 2-3 GM-% IV SOLR
INTRAVENOUS | Status: AC
  Filled 2012-10-09: qty 50

## 2012-10-09 MED ORDER — DEXAMETHASONE SODIUM PHOSPHATE 10 MG/ML IJ SOLN
INTRAMUSCULAR | Status: AC
  Administered 2012-10-09: 10 mg via INTRAVENOUS
  Filled 2012-10-09: qty 1

## 2012-10-09 MED ORDER — NIMODIPINE 30 MG PO CAPS
ORAL_CAPSULE | ORAL | Status: AC
  Administered 2012-10-09: 60 mg
  Filled 2012-10-09: qty 1

## 2012-10-09 MED ORDER — MORPHINE SULFATE 2 MG/ML IJ SOLN
2.0000 mg | INTRAMUSCULAR | Status: DC | PRN
  Administered 2012-10-09 (×2): 2 mg via INTRAVENOUS
  Administered 2012-10-10: 4 mg via INTRAVENOUS
  Filled 2012-10-09: qty 1
  Filled 2012-10-09: qty 2
  Filled 2012-10-09: qty 1

## 2012-10-09 MED ORDER — IRBESARTAN 75 MG PO TABS
75.0000 mg | ORAL_TABLET | Freq: Every day | ORAL | Status: DC
  Administered 2012-10-10: 75 mg via ORAL
  Filled 2012-10-09 (×2): qty 1

## 2012-10-09 MED ORDER — EZETIMIBE-SIMVASTATIN 10-40 MG PO TABS: 1.0000 | ORAL_TABLET | Freq: Every day | ORAL | Status: DC

## 2012-10-09 MED ORDER — ONDANSETRON HCL 4 MG/2ML IJ SOLN
INTRAMUSCULAR | Status: DC | PRN
  Administered 2012-10-09: 4 mg via INTRAMUSCULAR

## 2012-10-09 MED ORDER — KETOROLAC TROMETHAMINE 30 MG/ML IJ SOLN
INTRAMUSCULAR | Status: AC
  Filled 2012-10-09: qty 1

## 2012-10-09 MED ORDER — ROCURONIUM BROMIDE 100 MG/10ML IV SOLN
INTRAVENOUS | Status: DC | PRN
  Administered 2012-10-09: 50 mg via INTRAVENOUS

## 2012-10-09 MED ORDER — KETOROLAC TROMETHAMINE 30 MG/ML IJ SOLN
30.0000 mg | Freq: Once | INTRAMUSCULAR | Status: AC
  Administered 2012-10-09: 30 mg via INTRAVENOUS

## 2012-10-09 MED ORDER — LIDOCAINE HCL (CARDIAC) 20 MG/ML IV SOLN
INTRAVENOUS | Status: DC | PRN
  Administered 2012-10-09: 50 mg via INTRAVENOUS

## 2012-10-09 MED ORDER — PANTOPRAZOLE SODIUM 40 MG IV SOLR
40.0000 mg | Freq: Once | INTRAVENOUS | Status: AC
  Administered 2012-10-09: 40 mg via INTRAVENOUS
  Filled 2012-10-09: qty 40

## 2012-10-09 MED ORDER — IRBESARTAN 75 MG PO TABS: 75.0000 mg | ORAL_TABLET | Freq: Every day | ORAL | Status: DC

## 2012-10-09 MED ORDER — HEPARIN SODIUM (PORCINE) 5000 UNIT/ML IJ SOLN
5000.0000 [IU] | Freq: Three times a day (TID) | INTRAMUSCULAR | Status: DC
  Administered 2012-10-09 – 2012-10-10 (×2): 5000 [IU] via SUBCUTANEOUS
  Filled 2012-10-09 (×5): qty 1

## 2012-10-09 MED ORDER — NADOLOL 40 MG PO TABS
40.0000 mg | ORAL_TABLET | Freq: Every day | ORAL | Status: DC
  Administered 2012-10-10: 40 mg via ORAL
  Filled 2012-10-09 (×2): qty 1

## 2012-10-09 NOTE — Progress Notes (Signed)
eLink Physician-Brief Progress Note Patient Name: SHADY PADRON DOB: 1945-04-23 MRN: [id number]  Date of Service  10/09/2012   HPI/Events of Note  Not on DVT proph   eICU Interventions  Hep sq ordered   Intervention Category Intermediate Interventions: Best-practice therapies (e.g. DVT, beta blocker, etc.)  Shan Levans 10/09/2012, 8:17 PM

## 2012-10-09 NOTE — Progress Notes (Signed)
Day of Surgery  Subjective: L MCA aneurysm Attempted coiling in IR with Dr Corliss Skains today Unsuccessful secondary to wide neck Complains of left sided headache behind Lt eye No N/V  Objective: Vital signs in last 24 hours: Temp:  [95 F (35 C)-98.7 F (37.1 C)] 97.7 F (36.5 C) (10/08 1545) Pulse Rate:  [61-82] 82 (10/08 1545) Resp:  [8-28] 17 (10/08 1545) BP: (90-159)/(45-97) 115/97 mmHg (10/08 1545) SpO2:  [96 %-100 %] 96 % (10/08 1545) Arterial Line BP: (123-150)/(46-55) 127/46 mmHg (10/08 1530)    Intake/Output from previous day:   Intake/Output this shift: Total I/O In: -  Out: 1300 [Urine:1300]  PE:  VSS; afeb Pt just in room now A/O; EOMI Appropriate Left wrist painful from A line Moves all 4s; good strength B Face symmetrical; tongue midline Rt groin NT; no bleeding; no hematoma  Lab Results:  No results found for this basename: WBC, HGB, HCT, PLT,  in the last 72 hours BMET No results found for this basename: NA, K, CL, CO2, GLUCOSE, BUN, CREATININE, CALCIUM,  in the last 72 hours PT/INR No results found for this basename: LABPROT, INR,  in the last 72 hours ABG No results found for this basename: PHART, PCO2, PO2, HCO3,  in the last 72 hours  Studies/Results: No results found.  Anti-infectives: Anti-infectives   Start     Dose/Rate Route Frequency Ordered Stop   10/09/12 0714  ceFAZolin (ANCEF) 2-3 GM-% IVPB SOLR  Status:  Discontinued    Comments:  Leandro Reasoner   : cabinet override      10/09/12 0714 10/09/12 0721   10/09/12 0613  ceFAZolin (ANCEF) 2-3 GM-% IVPB SOLR    Comments:  Leandro Reasoner   : cabinet override      10/09/12 1478 10/09/12 1829      Assessment/Plan: s/p Procedure(s): RADIOLOGY WITH ANESTHESIA (N/A)  Left MCA aneurysm  Attempted coiling unsuccessful Admitted for overnight obs Dr Corliss Skains has seen and examine pt Decadron 10 mg IV now Protonix 40 mg IV now Dc art line now May dc foley in a few hrs if pt  prefers Plan for dc in am if stable   LOS: 0 days    Sari Cogan A 10/09/2012

## 2012-10-09 NOTE — Progress Notes (Signed)
Patient ID: Amber Brooks, female   DOB: [birth year], 67 y.o.   MRN: [id number]  Patient extubated without difficulty.Fully awake.Responds appropriately to commands .Speech and comprehension WNLs. C/O Lt retroorbital and facial H/A ,unchanged from prior to the procedure. Denies any N/V ,visual or motor symptoms..  PERLA Rt = LT 2.71mm . No facial asymmetry. Tongue midline. No pronation drift. Power 4/5 proportional to effort. RT groin soft.

## 2012-10-09 NOTE — H&P (Signed)
Chief Complaint: "I'm here for my aneurysm treatment"  HPI: Amber Brooks is an 67 y.o. female with hx of preior stent treatment of left MCA aneurysm. It has recently been found on MRA to be slightly increased in size. She was seen in consult by Dr. Corliss Skains, see PACS IR Rad Eval for details. She is now scheduled for formal cerebral arteriogram with intent to treat the aneurysm if necessary. She feels fine, no recent illness, fevers, chills. Only recent symptoms are increased frequency of her migraines. Taking ASA 81mg  daily.  Past Medical History:  Past Medical History  Diagnosis Date  . Hypertension   . Brain aneurysm   . Mitral valve prolapse   . Complication of anesthesia   . PONV (postoperative nausea and vomiting)   . GERD (gastroesophageal reflux disease)   . Headache(784.0)     migraines  . Anxiety     Past Surgical History:  Past Surgical History  Procedure Laterality Date  . Tonsillectomy    . Abdominal hysterectomy  1987  . Vascular surgery      stent placed    Family History: No family history on file.  Social History:  reports that she has never smoked. She does not have any smokeless tobacco history on file. She reports that she drinks alcohol. She reports that she does not use illicit drugs.  Allergies:  Allergies  Allergen Reactions  . Latex Anaphylaxis  . Sulfa Antibiotics Other (See Comments)    Unknown     Medications:   Medication List    Notice   Cannot display patient medications because the patient has not yet been checked in.      Please HPI for pertinent positives, otherwise complete 10 system ROS negative.  Physical Exam: BP 159/73  Pulse 61  Temp(Src) 97.1 F (36.2 C) (Oral)  Resp 20  SpO2 100% There is no weight on file to calculate BMI.   General Appearance:  Alert, cooperative, no distress, appears stated age  Head:  Normocephalic, without obvious abnormality, atraumatic  ENT: Unremarkable   Neck: Supple, symmetrical,  trachea midline  Lungs:   Clear to auscultation bilaterally, no w/r/r  Chest Wall:  No tenderness or deformity  Heart:  Regular rate and rhythm, S1, S2 normal, no murmur, rub or gallop.  Extremities: Extremities normal, atraumatic, no cyanosis or edema  Pulses: 2+ and symmetric femoral  Neurologic: Normal affect, no gross deficits.   Labs: CBC    Component Value Date/Time   WBC 6.8 10/04/2012 0929   RBC 4.52 10/04/2012 0929   HGB 13.6 10/04/2012 0929   HCT 40.8 10/04/2012 0929   PLT 235 10/04/2012 0929   MCV 90.3 10/04/2012 0929   MCH 30.1 10/04/2012 0929   MCHC 33.3 10/04/2012 0929   RDW 13.1 10/04/2012 0929   LYMPHSABS 2.0 10/04/2012 0929   MONOABS 0.5 10/04/2012 0929   EOSABS 0.5 10/04/2012 0929   BASOSABS 0.0 10/04/2012 0929    BMET    Component Value Date/Time   NA 138 10/04/2012 0929   K 3.8 10/04/2012 0929   CL 101 10/04/2012 0929   CO2 27 10/04/2012 0929   GLUCOSE 82 10/04/2012 0929   BUN 21 10/04/2012 0929   CREATININE 0.79 10/04/2012 0929   CALCIUM 9.2 10/04/2012 0929   GFRNONAA 85* 10/04/2012 0929   GFRAA >90 10/04/2012 0929    Prothrombin Time/INR 12.6/0.96  Assessment/Plan 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 Labs reviewed. Consent signed in chart  Brayton El PA-C 10/09/2012, 8:02 AM

## 2012-10-09 NOTE — Preoperative (Signed)
Beta Blockers   Reason not to administer Beta Blockers:Not Applicable 

## 2012-10-09 NOTE — Transfer of Care (Signed)
Immediate Anesthesia Transfer of Care Note  Patient: Amber Brooks  Procedure(s) Performed: Procedure(s): RADIOLOGY WITH ANESTHESIA (N/A)  Patient Location: PACU  Anesthesia Type:General  Level of Consciousness: awake, alert  and oriented  Airway & Oxygen Therapy: Patient Spontanous Breathing and Patient connected to nasal cannula oxygen  Post-op Assessment: Report given to PACU RN and Post -op Vital signs reviewed and stable  Post vital signs: Reviewed and stable  Complications: No apparent anesthesia complications

## 2012-10-09 NOTE — Procedures (Signed)
S/P 4 vessel cerebral arteriogram. RT CFA approach . FIndings. 1Lt MCA trifurcation aneurysm .approx 3.64mm x 2.6 mm Attempted coiling unsuccessful because of wide neck

## 2012-10-09 NOTE — Progress Notes (Signed)
eLink Physician-Brief Progress Note Patient Name: Amber Brooks DOB: 04-03-1945 MRN: [id number]  Date of Service  10/09/2012   HPI/Events of Note  Pt c/o pain / headache   eICU Interventions  Morphine ordered, fentanyl ineffective    Intervention Category Intermediate Interventions: Pain - evaluation and management  Shan Levans 10/09/2012, 9:56 PM

## 2012-10-09 NOTE — Anesthesia Preprocedure Evaluation (Addendum)
Anesthesia Evaluation  Patient identified by MRN, date of birth, ID band Patient awake    Reviewed: Allergy & Precautions, H&P , NPO status , Patient's Chart, lab work & pertinent test results  History of Anesthesia Complications (+) PONV  Airway Mallampati: II TM Distance: >3 FB Neck ROM: Full    Dental no notable dental hx. (+) Teeth Intact and Dental Advisory Given   Pulmonary neg pulmonary ROS,  breath sounds clear to auscultation  Pulmonary exam normal       Cardiovascular hypertension, On Medications and On Home Beta Blockers + Peripheral Vascular Disease Rhythm:Regular Rate:Normal     Neuro/Psych  Headaches, negative psych ROS   GI/Hepatic Neg liver ROS, GERD-  Medicated and Controlled,  Endo/Other  negative endocrine ROS  Renal/GU negative Renal ROS  negative genitourinary   Musculoskeletal   Abdominal   Peds  Hematology negative hematology ROS (+)   Anesthesia Other Findings   Reproductive/Obstetrics negative OB ROS                          Anesthesia Physical Anesthesia Plan  ASA: III  Anesthesia Plan: General   Post-op Pain Management:    Induction: Intravenous  Airway Management Planned: Oral ETT  Additional Equipment: Arterial line  Intra-op Plan:   Post-operative Plan: Extubation in OR  Informed Consent: I have reviewed the patients History and Physical, chart, labs and discussed the procedure including the risks, benefits and alternatives for the proposed anesthesia with the patient or authorized representative who has indicated his/her understanding and acceptance.   Dental advisory given  Plan Discussed with: CRNA  Anesthesia Plan Comments:         Anesthesia Quick Evaluation

## 2012-10-10 ENCOUNTER — Encounter (HOSPITAL_COMMUNITY): Payer: Self-pay | Admitting: *Deleted

## 2012-10-10 DIAGNOSIS — I671 Cerebral aneurysm, nonruptured: Secondary | ICD-10-CM | POA: Diagnosis not present

## 2012-10-10 LAB — CBC
HCT: 34.6 % — ABNORMAL LOW (ref 36.0–46.0)
MCH: 30.1 pg (ref 26.0–34.0)
MCHC: 33.8 g/dL (ref 30.0–36.0)
MCV: 88.9 fL (ref 78.0–100.0)
Platelets: 192 10*3/uL (ref 150–400)
RBC: 3.89 MIL/uL (ref 3.87–5.11)
RDW: 12.8 % (ref 11.5–15.5)

## 2012-10-10 LAB — CREATININE, SERUM
Creatinine, Ser: 0.67 mg/dL (ref 0.50–1.10)
GFR calc Af Amer: >90 mL/min (ref >90)

## 2012-10-10 MED ORDER — KETOROLAC TROMETHAMINE 10 MG PO TABS: 10.0000 mg | ORAL_TABLET | Freq: Three times a day (TID) | ORAL | Status: AC

## 2012-10-10 MED ORDER — DEXAMETHASONE 2 MG PO TABS: ORAL_TABLET | ORAL | Status: DC

## 2012-10-10 MED ORDER — PROMETHAZINE HCL 12.5 MG PO TABS: 12.5000 mg | ORAL_TABLET | Freq: Four times a day (QID) | ORAL | Status: DC | PRN

## 2012-10-10 NOTE — Progress Notes (Signed)
Pt being discharged home with daughter. IV discontinued. Reviewed discharge instructions with the patient; signed copy placed in chart.  Patient education and care plans completed. Pt stated she had no further questions.

## 2012-10-10 NOTE — Discharge Summary (Signed)
Physician Discharge Summary  Patient ID: Amber Brooks MRN: [id number] DOB/AGE: 1945-03-20 67 y.o.  Admit date: 10/09/2012 Discharge date: 10/10/2012  Admission Diagnoses: Left MCA aneurysm  Discharge Diagnoses:  Left MCA aneurysm s/p unsuccessful coiling.  Procedures: Procedure(s): RADIOLOGY WITH ANESTHESIA  Discharged Condition: Good.  Hospital Course:  Patient was seen in consult by Dr. 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 At that time she had noticed recent worsening of her chronic complicated migraines. Migraines increased in frequency and duration. Most of her headaches occur in the right temporal and occipital regions. Associated with these headaches are visual changes which include tunnel vision, blurred vision and increased tearing of the eyes. The patient states the above symptoms were exacerbated following the recent MRI/MRA of the head which was performed on 09/16/2012. Most recent MRI of the head revealed no acute intracranial process with the exception of a new solitary left caudate head microhemorrhage which could reflect sequelae of chronic hypertension. MRA of the head revealed left middle cerebral artery stent in place with a 2 x 3.18 mm aneurysm of the distal aspect, previously measuring 2.5 x 2.5 mm. There were no definite emodynamically significant stenoses. The patient was scheduled to undergo a follow-up catheter angiogram to assess changes from previous angiogram.   She presented on 10/09/12 underwent the procedure with unsucessful coiling of the left MCA aneurysm secondary to wide neck. She had sheath removal with no complications. She is sitting in bed still with c/o left sided headache similar to headache she previously experienced. She denies any groin pain. She has urinated, ate breakfast and ambulated  the halls. Dr. Corliss Skains has seen and examined the patient today. Results were discussed with the patient and her daughter. Follow-up with be with Dr. Franky Macho next week, Dr. Franky Macho is aware and will setup appointment. Patient is to continue on ASA 81mg  daily. Additional medications will be prescribed, decadron x 4 days, ketorolac x 4days, and phenergan PRN for nausea. The patient and her daughter leave today with good understanding of the above plan. Dr. Corliss Skains feels the patient is stable and is to be discharge home today, the patient her her family agree with this plan.   Consults: None.  Discharge Exam: Blood pressure 91/71, pulse 100, temperature 97.5 F (36.4 C), temperature source Oral, resp. rate 17, height 5\' 2"  (1.575 m), weight 127 lb 10.3 oz (57.9 kg), SpO2 97.00%.  PE: General: A&Ox3, NAD Heart: RRR without M/G/R Lungs: CTA bilaterally Ext: right groin site soft, NT, no signs of bleeding, hematoma or infection. DP intact bilaterally. Neuro: CN2-12 grossly intact, PERRLA, no ataxia, equal motor strength bilaterally. Face symmetrical, tongue midline  Disposition: 01-Home or Self Care  Discharge Orders   Future Orders Complete By Expires   Call MD for:  difficulty breathing, headache or visual disturbances  As directed    Call MD for:  extreme fatigue  As directed    Call MD for:  hives  As directed    Call MD for:  persistant dizziness or light-headedness  As directed    Call MD for:  persistant nausea and vomiting  As directed    Call MD for:  redness, tenderness, or signs of infection (pain, swelling, redness, odor or green/yellow discharge around incision site)  As directed    Call MD for:  severe uncontrolled pain  As directed  Call MD for:  temperature >100.4  As directed    Diet - low sodium heart healthy  As directed    Driving Restrictions  As directed    Comments:     No driving x 1 week   Increase activity slowly  As directed    Lifting restrictions  As  directed    Comments:     No lifting over 10 lbs x 1 week   Remove dressing in 24 hours  As directed        Medication List         ALPRAZolam 0.5 MG tablet  Commonly known as:  XANAX  Take 0.5 mg by mouth at bedtime as needed for sleep or anxiety.     aspirin 81 MG chewable tablet  Chew 81 mg by mouth daily.     dexamethasone 2 MG tablet  Commonly known as:  DECADRON  Take 3mg  TID x 2 days, then 2mg  TID x 2 days then stop     esomeprazole 40 MG capsule  Commonly known as:  NEXIUM  Take 40 mg by mouth daily before breakfast.     estradiol 1 MG tablet  Commonly known as:  ESTRACE  Take 0.5 mg by mouth every other day.     ezetimibe-simvastatin 10-40 MG per tablet  Commonly known as:  VYTORIN  Take 1 tablet by mouth daily.     HAIR/SKIN/NAILS PO  Take 1 tablet by mouth daily.     ketorolac 10 MG tablet  Commonly known as:  TORADOL  Take 1 tablet (10 mg total) by mouth 3 (three) times daily with meals.     multivitamin with minerals Tabs tablet  Take 1 tablet by mouth daily. Gummy Vitamin     nadolol 40 MG tablet  Commonly known as:  CORGARD  Take 40 mg by mouth daily.     OMEGA 3 PO  Take 1 tablet by mouth daily. Gummy     promethazine 12.5 MG tablet  Commonly known as:  PHENERGAN  Take 1 tablet (12.5 mg total) by mouth every 6 (six) hours as needed for nausea.     telmisartan 80 MG tablet  Commonly known as:  MICARDIS  Take 80 mg by mouth daily.     venlafaxine 75 MG tablet  Commonly known as:  EFFEXOR  Take 75 mg by mouth 2 (two) times daily.     VITAMIN D3 PO  Take 1 tablet by mouth daily.           Follow-up Information   Follow up with CABBELL,KYLE L, MD In 1 week. (Dr. Franky Macho aware.)    Specialty:  Neurosurgery   Contact information:   [address]                         [address] [place] [address] [phone]       Signed: Berneta Levins PA-C 10/10/2012, 10:30 AM

## 2012-10-10 NOTE — Progress Notes (Signed)
UR completed.  Johnrobert Foti, RN BSN [facility] CCM Trauma/Neuro ICU Case Manager [phone]  

## 2012-10-11 ENCOUNTER — Encounter (HOSPITAL_COMMUNITY): Payer: Self-pay | Admitting: Interventional Radiology

## 2012-10-14 LAB — POCT ACTIVATED CLOTTING TIME: Activated Clotting Time: 232 seconds

## 2012-10-14 NOTE — Progress Notes (Addendum)
Patient ID: Amber Brooks, female   DOB: 03-Nov-1945, 67 y.o.   MRN: [id number]   Pt still complains of headache behind L eye. Pain is all day/ every day Ranks pain now "7" on scale of 10 Can be 8 or 9 on worst days  Light headedness prevents her from walking much- although has  been getting up daily- some better. She also complains of NO BM since surgery; not passing gas.  Meds: Toradol 10 mg every 8 hrs            Ibuprofen 200 mg as needed            Decadron 2mg  taper   Discussed with Dr Corliss Skains Rec: can take Toradol every 6 hrs Continue all other meds Push po fluids Start OTC Colace Encourage ambulation.  Pt is to see Dr Franky Macho this week She will call office if has not heard from them next day or so.  Pt has good understanding of this plan.

## 2012-10-16 NOTE — Anesthesia Postprocedure Evaluation (Signed)
  Anesthesia Post-op Note  Patient: Amber Brooks  Procedure(s) Performed: Procedure(s): RADIOLOGY WITH ANESTHESIA (N/A)  Patient discharged prior to post op visit, no reported anesthetic complications

## 2012-10-17 ENCOUNTER — Other Ambulatory Visit: Payer: Self-pay | Admitting: Neurosurgery

## 2012-10-17 DIAGNOSIS — I671 Cerebral aneurysm, nonruptured: Secondary | ICD-10-CM | POA: Diagnosis not present

## 2012-10-25 ENCOUNTER — Telehealth (HOSPITAL_COMMUNITY): Payer: Self-pay | Admitting: Interventional Radiology

## 2012-10-25 NOTE — Telephone Encounter (Signed)
Called pt see if she has scheduled neurosurgical procedure. Left VM for her to call back and set her f/u appt up as well. JM

## 2012-11-06 ENCOUNTER — Encounter (HOSPITAL_COMMUNITY): Payer: Self-pay | Admitting: Pharmacy Technician

## 2012-11-07 ENCOUNTER — Other Ambulatory Visit (HOSPITAL_COMMUNITY): Payer: Medicare Other

## 2012-11-07 ENCOUNTER — Other Ambulatory Visit: Payer: Self-pay

## 2012-11-09 NOTE — Pre-Procedure Instructions (Signed)
ILY DENNO  11/09/2012   Your procedure is scheduled on:  November 14  Report to [facility] Entrance "A" [address] at [employer] AM.  Call this number if you have problems the morning of surgery: [phone]   Remember:   Do not eat food or drink liquids after midnight.   Take these medicines the morning of surgery with A SIP OF WATER: Benadryl (if needed), Nexium, nadolol, Promethazine (if needed), Effexor,    STOP Multiple Vitamins, Ibuprofen, Vitamin D, Biotin, Aspirin today   STOP Aspirin, Aleve, Naproxen, Advil, Ibuprofen, Vitamin, Herbs, and Supplements starting today   Do not wear jewelry, make-up or nail polish.  Do not wear lotions, powders, or perfumes. You may wear deodorant.  Do not shave 48 hours prior to surgery. Men may shave face and neck.  Do not bring valuables to the hospital.  [facility] is not responsible                  for any belongings or valuables.               Contacts, dentures or bridgework may not be worn into surgery.  Leave suitcase in the car. After surgery it may be brought to your room.  For patients admitted to the hospital, discharge time is determined by your                treatment team.               Special Instructions: Shower using CHG 2 nights before surgery and the night before surgery.  If you shower the day of surgery use CHG.  Use special wash - you have one bottle of CHG for all showers.  You should use approximately 1/3 of the bottle for each shower.   Please read over the following fact sheets that you were given: Pain Booklet, Coughing and Deep Breathing, Blood Transfusion Information and Surgical Site Infection Prevention

## 2012-11-11 ENCOUNTER — Encounter (HOSPITAL_COMMUNITY): Payer: Self-pay

## 2012-11-11 ENCOUNTER — Encounter (HOSPITAL_COMMUNITY)
Admission: RE | Admit: 2012-11-11 | Discharge: 2012-11-11 | Disposition: A | Discharge status: Home / Self Care | Payer: Medicare Other | Source: Ambulatory Visit | Attending: Neurosurgery | Admitting: Neurosurgery

## 2012-11-11 DIAGNOSIS — Z01812 Encounter for preprocedural laboratory examination: Secondary | ICD-10-CM | POA: Insufficient documentation

## 2012-11-11 HISTORY — DX: Depression, unspecified: F32.A

## 2012-11-11 HISTORY — DX: Major depressive disorder, single episode, unspecified: F32.9

## 2012-11-11 LAB — CBC
MCH: 31.2 pg (ref 26.0–34.0)
MCHC: 34 g/dL (ref 30.0–36.0)
MCV: 91.6 fL (ref 78.0–100.0)
Platelets: 253 10*3/uL (ref 150–400)
RDW: 13.3 % (ref 11.5–15.5)
WBC: 6.6 10*3/uL (ref 4.0–10.5)

## 2012-11-11 LAB — BASIC METABOLIC PANEL
BUN: 13 mg/dL (ref 6–23)
Calcium: 9.2 mg/dL (ref 8.4–10.5)
Chloride: 106 mEq/L (ref 96–112)
Creatinine, Ser: 0.84 mg/dL (ref 0.50–1.10)
GFR calc Af Amer: 82 mL/min — ABNORMAL LOW (ref >90)
GFR calc non Af Amer: 71 mL/min — ABNORMAL LOW (ref >90)
Sodium: 141 mEq/L (ref 135–145)

## 2012-11-11 NOTE — Progress Notes (Addendum)
PCP is Dr. Lupita Raider Cardiologist is Dr Verdis Prime States that she had a stress test 4 years ago Denies having card cath. Echo along with ekg noted in chart from [facility].   Pt also request that arterial line be placed after she is asleep, due to having a bad experience before.

## 2012-11-14 ENCOUNTER — Encounter (HOSPITAL_COMMUNITY): Payer: Self-pay | Admitting: Certified Registered Nurse Anesthetist

## 2012-11-14 MED ORDER — CEFAZOLIN SODIUM-DEXTROSE 2-3 GM-% IV SOLR
2.0000 g | INTRAVENOUS | Status: AC
  Administered 2012-11-15: 2 g via INTRAVENOUS

## 2012-11-15 ENCOUNTER — Inpatient Hospital Stay (HOSPITAL_COMMUNITY): Payer: Medicare Other | Admitting: Certified Registered Nurse Anesthetist

## 2012-11-15 ENCOUNTER — Encounter (HOSPITAL_COMMUNITY): Payer: Medicare Other | Admitting: Certified Registered Nurse Anesthetist

## 2012-11-15 ENCOUNTER — Encounter (HOSPITAL_COMMUNITY): Payer: Self-pay | Admitting: *Deleted

## 2012-11-15 ENCOUNTER — Inpatient Hospital Stay (HOSPITAL_COMMUNITY): Payer: Medicare Other

## 2012-11-15 ENCOUNTER — Encounter (HOSPITAL_COMMUNITY)
Admission: RE | Disposition: A | Discharge status: Home / Self Care | Payer: Self-pay | Source: Ambulatory Visit | Attending: Neurosurgery

## 2012-11-15 ENCOUNTER — Inpatient Hospital Stay (HOSPITAL_COMMUNITY)
Admission: RE | Admit: 2012-11-15 | Discharge: 2012-11-22 | DRG: 027 | Disposition: A | Discharge status: Home / Self Care | Payer: Medicare Other | Source: Ambulatory Visit | Attending: Neurosurgery | Admitting: Neurosurgery

## 2012-11-15 DIAGNOSIS — F410 Panic disorder [episodic paroxysmal anxiety] without agoraphobia: Secondary | ICD-10-CM | POA: Diagnosis not present

## 2012-11-15 DIAGNOSIS — G9389 Other specified disorders of brain: Secondary | ICD-10-CM | POA: Diagnosis not present

## 2012-11-15 DIAGNOSIS — K219 Gastro-esophageal reflux disease without esophagitis: Secondary | ICD-10-CM | POA: Diagnosis not present

## 2012-11-15 DIAGNOSIS — I10 Essential (primary) hypertension: Secondary | ICD-10-CM | POA: Diagnosis not present

## 2012-11-15 DIAGNOSIS — I671 Cerebral aneurysm, nonruptured: Secondary | ICD-10-CM | POA: Diagnosis present

## 2012-11-15 DIAGNOSIS — Z8249 Family history of ischemic heart disease and other diseases of the circulatory system: Secondary | ICD-10-CM | POA: Diagnosis not present

## 2012-11-15 DIAGNOSIS — Z452 Encounter for adjustment and management of vascular access device: Secondary | ICD-10-CM | POA: Diagnosis not present

## 2012-11-15 DIAGNOSIS — R918 Other nonspecific abnormal finding of lung field: Secondary | ICD-10-CM | POA: Diagnosis not present

## 2012-11-15 HISTORY — PX: CRANIOTOMY: SHX93

## 2012-11-15 SURGERY — CRANIOTOMY INTRACRANIAL ANEURYSM FOR CAROTID
Anesthesia: General | Site: Head | Laterality: Left | Wound class: Clean

## 2012-11-15 MED ORDER — MICROFIBRILLAR COLL HEMOSTAT EX PADS
MEDICATED_PAD | CUTANEOUS | Status: DC | PRN
  Administered 2012-11-15: 1 via TOPICAL

## 2012-11-15 MED ORDER — FENTANYL CITRATE 0.05 MG/ML IJ SOLN
INTRAMUSCULAR | Status: DC | PRN
  Administered 2012-11-15 (×2): 50 ug via INTRAVENOUS
  Administered 2012-11-15: 100 ug via INTRAVENOUS
  Administered 2012-11-15: 150 ug via INTRAVENOUS
  Administered 2012-11-15: 100 ug via INTRAVENOUS
  Administered 2012-11-15: 50 ug via INTRAVENOUS

## 2012-11-15 MED ORDER — POTASSIUM CHLORIDE IN NACL 20-0.9 MEQ/L-% IV SOLN
INTRAVENOUS | Status: DC
  Administered 2012-11-15 – 2012-11-16 (×3): via INTRAVENOUS
  Filled 2012-11-15 (×15): qty 1000

## 2012-11-15 MED ORDER — CEFAZOLIN SODIUM-DEXTROSE 2-3 GM-% IV SOLR
INTRAVENOUS | Status: AC
  Filled 2012-11-15: qty 50

## 2012-11-15 MED ORDER — OXYCODONE HCL 5 MG/5ML PO SOLN: 5.0000 mg | Freq: Once | ORAL | Status: DC | PRN

## 2012-11-15 MED ORDER — NICARDIPINE HCL IN NACL 20-0.86 MG/200ML-% IV SOLN
INTRAVENOUS | Status: DC | PRN
  Administered 2012-11-15: 5 mg/h via INTRAVENOUS

## 2012-11-15 MED ORDER — EZETIMIBE-SIMVASTATIN 10-40 MG PO TABS
1.0000 | ORAL_TABLET | Freq: Every day | ORAL | Status: DC
  Filled 2012-11-15: qty 1

## 2012-11-15 MED ORDER — ALPRAZOLAM 0.5 MG PO TABS
0.5000 mg | ORAL_TABLET | Freq: Every evening | ORAL | Status: DC | PRN
  Administered 2012-11-16 – 2012-11-19 (×5): 0.5 mg via ORAL
  Filled 2012-11-15 (×6): qty 1

## 2012-11-15 MED ORDER — INDOCYANINE GREEN 25 MG IV SOLR
75.0000 mg | INTRAVENOUS | Status: DC
  Filled 2012-11-15: qty 75

## 2012-11-15 MED ORDER — PANTOPRAZOLE SODIUM 40 MG PO TBEC: 80.0000 mg | DELAYED_RELEASE_TABLET | Freq: Every day | ORAL | Status: DC

## 2012-11-15 MED ORDER — HYDROMORPHONE HCL PF 1 MG/ML IJ SOLN
1.0000 mg | INTRAMUSCULAR | Status: DC | PRN
  Administered 2012-11-15 – 2012-11-16 (×3): 1 mg via INTRAVENOUS
  Filled 2012-11-15 (×3): qty 1

## 2012-11-15 MED ORDER — EZETIMIBE 10 MG PO TABS
10.0000 mg | ORAL_TABLET | Freq: Every day | ORAL | Status: DC
  Administered 2012-11-15 – 2012-11-21 (×7): 10 mg via ORAL
  Filled 2012-11-15 (×8): qty 1

## 2012-11-15 MED ORDER — GLYCOPYRROLATE 0.2 MG/ML IJ SOLN
INTRAMUSCULAR | Status: DC | PRN
  Administered 2012-11-15: 0.1 mg via INTRAVENOUS
  Administered 2012-11-15: 0.6 mg via INTRAVENOUS

## 2012-11-15 MED ORDER — ACETAMINOPHEN 325 MG PO TABS: 650.0000 mg | ORAL_TABLET | ORAL | Status: DC | PRN

## 2012-11-15 MED ORDER — DEXAMETHASONE 4 MG PO TABS
4.0000 mg | ORAL_TABLET | Freq: Four times a day (QID) | ORAL | Status: DC
  Administered 2012-11-15 – 2012-11-21 (×25): 4 mg via ORAL
  Filled 2012-11-15 (×27): qty 1

## 2012-11-15 MED ORDER — LEVETIRACETAM 500 MG/5ML IV SOLN
500.0000 mg | Freq: Two times a day (BID) | INTRAVENOUS | Status: DC
  Administered 2012-11-15: 500 mg via INTRAVENOUS
  Filled 2012-11-15 (×2): qty 5

## 2012-11-15 MED ORDER — NALOXONE HCL 0.4 MG/ML IJ SOLN: 0.0800 mg | INTRAMUSCULAR | Status: DC | PRN

## 2012-11-15 MED ORDER — ONDANSETRON HCL 4 MG/2ML IJ SOLN
4.0000 mg | INTRAMUSCULAR | Status: DC | PRN
  Administered 2012-11-15 – 2012-11-21 (×5): 4 mg via INTRAVENOUS
  Filled 2012-11-15 (×6): qty 2

## 2012-11-15 MED ORDER — PANTOPRAZOLE SODIUM 40 MG IV SOLR
40.0000 mg | Freq: Every day | INTRAVENOUS | Status: DC
  Administered 2012-11-15 – 2012-11-16 (×2): 40 mg via INTRAVENOUS
  Filled 2012-11-15 (×3): qty 40

## 2012-11-15 MED ORDER — VENLAFAXINE HCL 75 MG PO TABS
75.0000 mg | ORAL_TABLET | Freq: Two times a day (BID) | ORAL | Status: DC
  Administered 2012-11-15 – 2012-11-22 (×14): 75 mg via ORAL
  Filled 2012-11-15 (×15): qty 1

## 2012-11-15 MED ORDER — NADOLOL 40 MG PO TABS
40.0000 mg | ORAL_TABLET | Freq: Every day | ORAL | Status: DC
  Administered 2012-11-16 – 2012-11-22 (×7): 40 mg via ORAL
  Filled 2012-11-15 (×7): qty 1

## 2012-11-15 MED ORDER — ESTRADIOL 1 MG PO TABS
0.5000 mg | ORAL_TABLET | ORAL | Status: DC
  Administered 2012-11-17 – 2012-11-21 (×3): 0.5 mg via ORAL
  Filled 2012-11-15 (×4): qty 0.5

## 2012-11-15 MED ORDER — SIMVASTATIN 40 MG PO TABS
40.0000 mg | ORAL_TABLET | Freq: Every day | ORAL | Status: DC
  Administered 2012-11-15 – 2012-11-21 (×7): 40 mg via ORAL
  Filled 2012-11-15 (×8): qty 1

## 2012-11-15 MED ORDER — LABETALOL HCL 5 MG/ML IV SOLN
10.0000 mg | INTRAVENOUS | Status: DC | PRN
  Administered 2012-11-17 – 2012-11-18 (×2): 20 mg via INTRAVENOUS
  Administered 2012-11-19 (×2): 40 mg via INTRAVENOUS
  Administered 2012-11-19 (×2): 20 mg via INTRAVENOUS
  Filled 2012-11-15: qty 8
  Filled 2012-11-15: qty 4
  Filled 2012-11-15: qty 8
  Filled 2012-11-15 (×3): qty 4

## 2012-11-15 MED ORDER — BISACODYL 10 MG RE SUPP
10.0000 mg | Freq: Every day | RECTAL | Status: DC | PRN
  Administered 2012-11-17: 10 mg via RECTAL
  Filled 2012-11-15: qty 1

## 2012-11-15 MED ORDER — ADULT MULTIVITAMIN W/MINERALS CH: 2.0000 | ORAL_TABLET | Freq: Every day | ORAL | Status: DC

## 2012-11-15 MED ORDER — MORPHINE SULFATE 2 MG/ML IJ SOLN
2.0000 mg | INTRAMUSCULAR | Status: DC | PRN
  Administered 2012-11-16 (×2): 4 mg via INTRAVENOUS
  Administered 2012-11-16: 6 mg via INTRAVENOUS
  Administered 2012-11-17 – 2012-11-20 (×4): 2 mg via INTRAVENOUS
  Filled 2012-11-15: qty 2
  Filled 2012-11-15: qty 1
  Filled 2012-11-15: qty 2
  Filled 2012-11-15: qty 1
  Filled 2012-11-15: qty 3
  Filled 2012-11-15 (×3): qty 1

## 2012-11-15 MED ORDER — SODIUM CHLORIDE 0.9 % IV SOLN
10.0000 mg | INTRAVENOUS | Status: DC | PRN
  Administered 2012-11-15: 40 ug/min via INTRAVENOUS

## 2012-11-15 MED ORDER — HYDROMORPHONE HCL PF 1 MG/ML IJ SOLN
0.2500 mg | INTRAMUSCULAR | Status: DC | PRN
  Administered 2012-11-15 (×2): 0.5 mg via INTRAVENOUS

## 2012-11-15 MED ORDER — PROMETHAZINE HCL 25 MG PO TABS
12.5000 mg | ORAL_TABLET | ORAL | Status: DC | PRN
  Administered 2012-11-22: 25 mg via ORAL
  Filled 2012-11-15: qty 1

## 2012-11-15 MED ORDER — ASPIRIN 81 MG PO CHEW: 81.0000 mg | CHEWABLE_TABLET | Freq: Every day | ORAL | Status: DC

## 2012-11-15 MED ORDER — MIDAZOLAM HCL 5 MG/5ML IJ SOLN
INTRAMUSCULAR | Status: DC | PRN
  Administered 2012-11-15 (×2): 1 mg via INTRAVENOUS

## 2012-11-15 MED ORDER — PROPOFOL 10 MG/ML IV BOLUS
INTRAVENOUS | Status: DC | PRN
  Administered 2012-11-15: 200 mg via INTRAVENOUS

## 2012-11-15 MED ORDER — SODIUM CHLORIDE 0.9 % IV SOLN
INTRAVENOUS | Status: DC | PRN
  Administered 2012-11-15: 07:00:00 via INTRAVENOUS

## 2012-11-15 MED ORDER — ONDANSETRON HCL 4 MG/2ML IJ SOLN
INTRAMUSCULAR | Status: DC | PRN
  Administered 2012-11-15: 4 mg via INTRAVENOUS

## 2012-11-15 MED ORDER — ONDANSETRON HCL 4 MG PO TABS
4.0000 mg | ORAL_TABLET | ORAL | Status: DC | PRN
  Administered 2012-11-20: 4 mg via ORAL
  Filled 2012-11-15: qty 1

## 2012-11-15 MED ORDER — SODIUM CHLORIDE 0.9 % IV SOLN
500.0000 mg | Freq: Two times a day (BID) | INTRAVENOUS | Status: DC
  Administered 2012-11-15 – 2012-11-17 (×4): 500 mg via INTRAVENOUS
  Filled 2012-11-15 (×5): qty 5

## 2012-11-15 MED ORDER — VITAMIN D3 25 MCG (1000 UNIT) PO TABS
1000.0000 [IU] | ORAL_TABLET | Freq: Every day | ORAL | Status: DC
  Administered 2012-11-16 – 2012-11-22 (×7): 1000 [IU] via ORAL
  Filled 2012-11-15 (×7): qty 1

## 2012-11-15 MED ORDER — POLYETHYLENE GLYCOL 3350 17 G PO PACK
17.0000 g | PACK | Freq: Every day | ORAL | Status: DC | PRN
  Filled 2012-11-15: qty 1

## 2012-11-15 MED ORDER — HYDROCODONE-ACETAMINOPHEN 5-325 MG PO TABS
1.0000 | ORAL_TABLET | ORAL | Status: DC | PRN
Start: 2012-11-15 — End: 2012-11-22
  Administered 2012-11-15 – 2012-11-21 (×16): 1 via ORAL
  Filled 2012-11-15 (×8): qty 1
  Filled 2012-11-15: qty 2
  Filled 2012-11-15 (×7): qty 1

## 2012-11-15 MED ORDER — NEOSTIGMINE METHYLSULFATE 1 MG/ML IJ SOLN
INTRAMUSCULAR | Status: DC | PRN
  Administered 2012-11-15: 3 mg via INTRAVENOUS

## 2012-11-15 MED ORDER — ALBUMIN HUMAN 5 % IV SOLN
INTRAVENOUS | Status: DC | PRN
  Administered 2012-11-15: 11:00:00 via INTRAVENOUS

## 2012-11-15 MED ORDER — SENNA 8.6 MG PO TABS
1.0000 | ORAL_TABLET | Freq: Two times a day (BID) | ORAL | Status: DC
Start: 2012-11-15 — End: 2012-11-22
  Administered 2012-11-15 – 2012-11-22 (×12): 8.6 mg via ORAL
  Filled 2012-11-15 (×15): qty 1

## 2012-11-15 MED ORDER — ROCURONIUM BROMIDE 100 MG/10ML IV SOLN
INTRAVENOUS | Status: DC | PRN
  Administered 2012-11-15: 30 mg via INTRAVENOUS
  Administered 2012-11-15: 20 mg via INTRAVENOUS
  Administered 2012-11-15: 50 mg via INTRAVENOUS

## 2012-11-15 MED ORDER — MORPHINE SULFATE 2 MG/ML IJ SOLN: 1.0000 mg | INTRAMUSCULAR | Status: DC | PRN

## 2012-11-15 MED ORDER — DEXAMETHASONE SODIUM PHOSPHATE 10 MG/ML IJ SOLN
INTRAMUSCULAR | Status: DC | PRN
  Administered 2012-11-15: 10 mg via INTRAVENOUS

## 2012-11-15 MED ORDER — OXYCODONE HCL 5 MG PO TABS: 5.0000 mg | ORAL_TABLET | Freq: Once | ORAL | Status: DC | PRN

## 2012-11-15 MED ORDER — ADULT MULTIVITAMIN W/MINERALS CH
1.0000 | ORAL_TABLET | Freq: Every day | ORAL | Status: DC
  Administered 2012-11-16 – 2012-11-22 (×7): 1 via ORAL
  Filled 2012-11-15 (×7): qty 1

## 2012-11-15 MED ORDER — LIDOCAINE HCL (CARDIAC) 20 MG/ML IV SOLN
INTRAVENOUS | Status: DC | PRN
  Administered 2012-11-15 (×2): 50 mg via INTRAVENOUS

## 2012-11-15 MED ORDER — SODIUM CHLORIDE 0.9 % IV SOLN
INTRAVENOUS | Status: DC | PRN
  Administered 2012-11-15 (×2): via INTRAVENOUS

## 2012-11-15 MED ORDER — MAGNESIUM CITRATE PO SOLN
1.0000 | Freq: Once | ORAL | Status: AC | PRN
  Filled 2012-11-15: qty 296

## 2012-11-15 MED ORDER — IRBESARTAN 300 MG PO TABS
300.0000 mg | ORAL_TABLET | Freq: Every day | ORAL | Status: DC
  Administered 2012-11-16 – 2012-11-22 (×7): 300 mg via ORAL
  Filled 2012-11-15 (×7): qty 1

## 2012-11-15 MED ORDER — DIPHENHYDRAMINE HCL 25 MG PO TABS
25.0000 mg | ORAL_TABLET | Freq: Every day | ORAL | Status: DC | PRN
  Administered 2012-11-16: 25 mg via ORAL
  Filled 2012-11-15: qty 1

## 2012-11-15 MED ORDER — BACITRACIN ZINC 500 UNIT/GM EX OINT
TOPICAL_OINTMENT | CUTANEOUS | Status: DC | PRN
  Administered 2012-11-15: 1 via TOPICAL

## 2012-11-15 MED ORDER — HYDROMORPHONE HCL PF 1 MG/ML IJ SOLN
INTRAMUSCULAR | Status: AC
  Filled 2012-11-15: qty 1

## 2012-11-15 MED ORDER — BIOTIN 5000 MCG PO CAPS: 3.0000 | ORAL_CAPSULE | Freq: Every day | ORAL | Status: DC

## 2012-11-15 MED ORDER — KETOROLAC TROMETHAMINE 30 MG/ML IJ SOLN
30.0000 mg | Freq: Three times a day (TID) | INTRAMUSCULAR | Status: AC
  Administered 2012-11-15 – 2012-11-18 (×9): 30 mg via INTRAVENOUS
  Filled 2012-11-15 (×10): qty 1

## 2012-11-15 MED ORDER — METOCLOPRAMIDE HCL 5 MG/ML IJ SOLN: 10.0000 mg | Freq: Once | INTRAMUSCULAR | Status: DC | PRN

## 2012-11-15 MED ORDER — EPHEDRINE SULFATE 50 MG/ML IJ SOLN
INTRAMUSCULAR | Status: DC | PRN
  Administered 2012-11-15 (×3): 5 mg via INTRAVENOUS

## 2012-11-15 MED ORDER — ACETAMINOPHEN 650 MG RE SUPP: 650.0000 mg | RECTAL | Status: DC | PRN

## 2012-11-15 MED ORDER — MORPHINE SULFATE 2 MG/ML IJ SOLN
INTRAMUSCULAR | Status: AC
  Administered 2012-11-15: 2 mg via INTRAVENOUS
  Filled 2012-11-15: qty 1

## 2012-11-15 MED ORDER — 0.9 % SODIUM CHLORIDE (POUR BTL) OPTIME
TOPICAL | Status: DC | PRN
  Administered 2012-11-15 (×2): 1000 mL

## 2012-11-15 SURGICAL SUPPLY — 107 items
APL SKNCLS STERI-STRIP NONHPOA (GAUZE/BANDAGES/DRESSINGS)
BANDAGE GAUZE ELAST BULKY 4 IN (GAUZE/BANDAGES/DRESSINGS) IMPLANT
BENZOIN TINCTURE PRP APPL 2/3 (GAUZE/BANDAGES/DRESSINGS) IMPLANT
BIT DRILL WIRE PASS 1.3MM (BIT) IMPLANT
BLADE EYE SICKLE 84 5 BEAV (BLADE) IMPLANT
BLADE SAW GIGLI 16 STRL (MISCELLANEOUS) IMPLANT
BLADE SURG 11 STRL SS (BLADE) ×1 IMPLANT
BLADE ULTRA TIP 2M (BLADE) IMPLANT
BRUSH SCRUB EZ 1% IODOPHOR (MISCELLANEOUS) ×2 IMPLANT
BRUSH SCRUB EZ PLAIN DRY (MISCELLANEOUS) ×2 IMPLANT
BUR ACORN 6.0 PRECISION (BURR) ×2 IMPLANT
BUR ADDG 1.1 (BURR) IMPLANT
BUR FLUTED EXTD (BURR) ×1 IMPLANT
BUR MATCHSTICK NEURO 3.0 LAGG (BURR) ×1 IMPLANT
BUR ROUTER D-58 CRANI (BURR) IMPLANT
CANISTER SUCT 3000ML (MISCELLANEOUS) ×2 IMPLANT
CLIP ANEURY TI PERM STD 6.5 (Clip) ×1 IMPLANT
CLIP TI MEDIUM 6 (CLIP) IMPLANT
CONT SPEC 4OZ CLIKSEAL STRL BL (MISCELLANEOUS) ×2 IMPLANT
CORDS BIPOLAR (ELECTRODE) ×2 IMPLANT
DECANTER SPIKE VIAL GLASS SM (MISCELLANEOUS) ×2 IMPLANT
DISPOSABLE DOPPLER PROBE FOR NEUROSURGERY ×2 IMPLANT
DORO DISPOSABLE SKULL PINS ×2 IMPLANT
DRAIN SNY WOU 7FLT (WOUND CARE) IMPLANT
DRAPE MICROSCOPE LEICA (MISCELLANEOUS) ×2 IMPLANT
DRAPE NEUROLOGICAL W/INCISE (DRAPES) ×2 IMPLANT
DRAPE WARM FLUID 44X44 (DRAPE) ×2 IMPLANT
DRESSING TELFA 8X3 (GAUZE/BANDAGES/DRESSINGS) ×1 IMPLANT
DRILL WIRE PASS 1.3MM (BIT)
DURAPREP 6ML APPLICATOR 50/CS (WOUND CARE) ×2 IMPLANT
ELECT CAUTERY BLADE 6.4 (BLADE) ×2 IMPLANT
ELECT REM PT RETURN 9FT ADLT (ELECTROSURGICAL) ×2
ELECTRODE REM PT RTRN 9FT ADLT (ELECTROSURGICAL) ×1 IMPLANT
EVACUATOR SILICONE 100CC (DRAIN) IMPLANT
GAUZE PACKING FOLDED 2  STR (GAUZE/BANDAGES/DRESSINGS) ×2
GAUZE PACKING FOLDED 2 STR (GAUZE/BANDAGES/DRESSINGS) IMPLANT
GAUZE SPONGE 4X4 16PLY XRAY LF (GAUZE/BANDAGES/DRESSINGS) IMPLANT
GLOVE BIO SURGEON STRL SZ 6.5 (GLOVE) IMPLANT
GLOVE BIO SURGEON STRL SZ7 (GLOVE) IMPLANT
GLOVE BIO SURGEON STRL SZ7.5 (GLOVE) IMPLANT
GLOVE BIO SURGEON STRL SZ8 (GLOVE) IMPLANT
GLOVE BIO SURGEON STRL SZ8.5 (GLOVE) IMPLANT
GLOVE BIOGEL M 8.0 STRL (GLOVE) IMPLANT
GLOVE BIOGEL PI IND STRL 7.5 (GLOVE) IMPLANT
GLOVE BIOGEL PI INDICATOR 7.5 (GLOVE) ×1
GLOVE ECLIPSE 6.5 STRL STRAW (GLOVE) ×2 IMPLANT
GLOVE ECLIPSE 7.0 STRL STRAW (GLOVE) IMPLANT
GLOVE ECLIPSE 7.5 STRL STRAW (GLOVE) IMPLANT
GLOVE ECLIPSE 8.0 STRL XLNG CF (GLOVE) IMPLANT
GLOVE ECLIPSE 8.5 STRL (GLOVE) IMPLANT
GLOVE EXAM NITRILE LRG STRL (GLOVE) IMPLANT
GLOVE EXAM NITRILE MD LF STRL (GLOVE) IMPLANT
GLOVE EXAM NITRILE XL STR (GLOVE) IMPLANT
GLOVE EXAM NITRILE XS STR PU (GLOVE) IMPLANT
GLOVE INDICATOR 6.5 STRL GRN (GLOVE) IMPLANT
GLOVE INDICATOR 7.0 STRL GRN (GLOVE) IMPLANT
GLOVE INDICATOR 7.5 STRL GRN (GLOVE) IMPLANT
GLOVE INDICATOR 8.0 STRL GRN (GLOVE) IMPLANT
GLOVE INDICATOR 8.5 STRL (GLOVE) IMPLANT
GLOVE OPTIFIT SS 8.0 STRL (GLOVE) IMPLANT
GLOVE SS N UNI LF 7.0 STRL (GLOVE) ×1 IMPLANT
GLOVE SURG SS PI 6.5 STRL IVOR (GLOVE) IMPLANT
GOWN BRE IMP SLV AUR LG STRL (GOWN DISPOSABLE) ×4 IMPLANT
GOWN BRE IMP SLV AUR XL STRL (GOWN DISPOSABLE) IMPLANT
GOWN STRL REIN 2XL LVL4 (GOWN DISPOSABLE) IMPLANT
HEMOSTAT SURGICEL 2X14 (HEMOSTASIS) ×2 IMPLANT
HOOK DURA (MISCELLANEOUS) ×2 IMPLANT
KIT BASIN OR (CUSTOM PROCEDURE TRAY) ×2 IMPLANT
KIT DRAIN CSF ACCUDRAIN (MISCELLANEOUS) IMPLANT
KIT ROOM TURNOVER OR (KITS) ×2 IMPLANT
KNIFE ARACHNOID DISP AM-24-S (MISCELLANEOUS) ×1 IMPLANT
MARKER SKIN DUAL TIP RULER LAB (MISCELLANEOUS) ×2 IMPLANT
NDL HYPO 25X1 1.5 SAFETY (NEEDLE) ×1 IMPLANT
NEEDLE HYPO 25X1 1.5 SAFETY (NEEDLE) ×2 IMPLANT
NS IRRIG 1000ML POUR BTL (IV SOLUTION) ×2 IMPLANT
PACK CRANIOTOMY (CUSTOM PROCEDURE TRAY) ×2 IMPLANT
PAD ARMBOARD 7.5X6 YLW CONV (MISCELLANEOUS) ×2 IMPLANT
PATTIES SURGICAL .25X.25 (GAUZE/BANDAGES/DRESSINGS) IMPLANT
PATTIES SURGICAL .5 X.5 (GAUZE/BANDAGES/DRESSINGS) IMPLANT
PATTIES SURGICAL .5 X1 (DISPOSABLE) ×2 IMPLANT
PATTIES SURGICAL .5 X3 (DISPOSABLE) IMPLANT
PATTIES SURGICAL 1/4 X 3 (GAUZE/BANDAGES/DRESSINGS) IMPLANT
PATTIES SURGICAL 1X1 (DISPOSABLE) IMPLANT
PIN MAYFIELD SKULL DISP (PIN) IMPLANT
PLATE 1.5  2HOLE LNG NEURO (Plate) ×3 IMPLANT
PLATE 1.5 2HOLE LNG NEURO (Plate) IMPLANT
PLATE 1.5 5HOLE XLONG Y (Plate) ×1 IMPLANT
RUBBERBAND STERILE (MISCELLANEOUS) ×4 IMPLANT
SCREW SELF DRILL HT 1.5/4MM (Screw) ×16 IMPLANT
SPONGE GAUZE 4X4 12PLY (GAUZE/BANDAGES/DRESSINGS) IMPLANT
SPONGE NEURO XRAY DETECT 1X3 (DISPOSABLE) IMPLANT
SPONGE SURGIFOAM ABS GEL 100 (HEMOSTASIS) IMPLANT
SPONGE SURGIFOAM ABS GEL 100C (HEMOSTASIS) ×2 IMPLANT
STAPLER SKIN PROX WIDE 3.9 (STAPLE) ×2 IMPLANT
SUT ETHILON 3 0 FSL (SUTURE) IMPLANT
SUT NURALON 4 0 TR CR/8 (SUTURE) ×5 IMPLANT
SUT VIC AB 2-0 CP2 18 (SUTURE) ×1 IMPLANT
SUT VIC AB 2-0 CT2 18 VCP726D (SUTURE) ×4 IMPLANT
SUT VIC AB 3-0 SH 8-18 (SUTURE) IMPLANT
SYR 20ML ECCENTRIC (SYRINGE) ×2 IMPLANT
SYR CONTROL 10ML LL (SYRINGE) ×2 IMPLANT
TOWEL OR 17X24 6PK STRL BLUE (TOWEL DISPOSABLE) ×2 IMPLANT
TOWEL OR 17X26 10 PK STRL BLUE (TOWEL DISPOSABLE) ×2 IMPLANT
TRAY FOLEY CATH 14FRSI W/METER (CATHETERS) IMPLANT
TUBING BULK SUCTION (MISCELLANEOUS) ×2 IMPLANT
UNDERPAD 30X30 INCONTINENT (UNDERPADS AND DIAPERS) IMPLANT
WATER STERILE IRR 1000ML POUR (IV SOLUTION) ×2 IMPLANT

## 2012-11-15 NOTE — Transfer of Care (Signed)
Immediate Anesthesia Transfer of Care Note  Patient: Amber Brooks  Procedure(s) Performed: Procedure(s) with comments: CRANIOTOMY INTRACRANIAL ANEURYSM FOR CAROTID (Left) - LEFT Craniotomy for Aneurysm  Patient Location: PACU  Anesthesia Type:General  Level of Consciousness: awake and alert   Airway & Oxygen Therapy: Patient Spontanous Breathing and Patient connected to nasal cannula oxygen  Post-op Assessment: Report given to PACU RN, Post -op Vital signs reviewed and stable and Patient moving all extremities X 4  Post vital signs: Reviewed and stable  Complications: No apparent anesthesia complications

## 2012-11-15 NOTE — Op Note (Signed)
11/15/2012  11:58 AM  PATIENT:  Amber Brooks  67 y.o. female  PRE-OPERATIVE DIAGNOSIS:  nonruptured cerebral aneursym left middle cerebral  POST-OPERATIVE DIAGNOSIS:  nonruptured cerebral aneursym left middle cerebral  PROCEDURE:  Procedure(s):Pteriornal left CRANIOTOMY INTRACRANIAL ANEURYSM clipping   SURGEON:  Surgeon(s): Carmela Hurt, MD Lisbeth Renshaw, MD  ASSISTANTS:Nundkumar, Narda Amber  ANESTHESIA:   general  EBL:  Total I/O In: 2050 [I.V.:1800; IV Piggyback:250] Out: 1425 [Urine:1350; Blood:75]  BLOOD ADMINISTERED:none  CELL SAVER GIVEN:none  COUNT:per nursing  DRAINS: none   SPECIMEN:  No Specimen  DICTATION: [name] was taken to the operating room, intubated and placed under a general anesthetic without difficulty. A foley catheter was placed under sterile conditions. After adequate anesthesia was obtained a three pin Doros head holder was placed. She was positioned with the malar eminence at the highest point and the head turned ~30% to the right. Her head was shaved and prepped and she was then draped in a sterile manner. I made a standard incision for a pterional approach. With Dr. Val Riles assistance we turned the flap and placed raney clips along the scalp edges. We reflected the flap rostrally with towel clips. We divided the temporalis muscle reflecting it rostrally along with the scalp.  We then turned a craniotomy placing burr holes at the keyhole and in the temporal bone and connecting these through the frontal and temporal bones. I scored the bone along the sphenoid wing, and we were able to remove the bone flap. We then drilled the sphenoid wing until we had a flat approach angle to the sylvian fissure. I opened the dura in a straight line over the sylvian fissure. With microscopic dissection I split the sylvian fissure and exposed the middle cerebral artery which contained a stent, and followed it to the aneurysm. I dissected the arachnoid  attachments around the dome and neck of the aneurysm to free it and allowed it to be manipulated safely. We then placed a small curved clip around the aneurysm neck taking care to not occlude any surrounding vessels. We checked our position with and ICG bolus and video. There was very good runoff in the main trunk of the vessel and a smaller vessel distal to the aneurysm. We then irrigated the wound then closed. I approximated the dura, bone flap with plates and screws, and the temporalis muscle. We closed the scalp approximating the galea with vicryl sutures, and the scalp edges with staples. A sterile dressing was applied. She was extubated and brought to the pacu.   PLAN OF CARE: Admit to inpatient   PATIENT DISPOSITION:  PACU - hemodynamically stable.   Delay start of Pharmacological VTE agent (>24hrs) due to surgical blood loss or risk of bleeding:  yes

## 2012-11-15 NOTE — Preoperative (Signed)
2Beta Blockers   Reason not to administer Beta Blockers:nadalol 11-14

## 2012-11-15 NOTE — Plan of Care (Signed)
Problem: Consults Goal: Diagnosis - Craniotomy Craniotomy for Left MCA Clipping

## 2012-11-15 NOTE — Anesthesia Procedure Notes (Signed)
Procedure Name: Intubation Date/Time: 11/15/2012 8:26 AM Performed by: Reine Just Pre-anesthesia Checklist: Patient identified, Timeout performed, Emergency Drugs available, Suction available and Patient being monitored Patient Re-evaluated:Patient Re-evaluated prior to inductionOxygen Delivery Method: Circle system utilized and Simple face mask Preoxygenation: Pre-oxygenation with 100% oxygen Intubation Type: IV induction Ventilation: Mask ventilation without difficulty Laryngoscope size: glidescope. Grade View: Grade I Tube type: Oral Tube size: 7.5 mm Number of attempts: 1 Airway Equipment and Method: Patient positioned with wedge pillow,  Stylet,  LTA kit utilized and Video-laryngoscopy Placement Confirmation: ETT inserted through vocal cords under direct vision,  positive ETCO2 and breath sounds checked- equal and bilateral Secured at: 21 cm Tube secured with: Tape Dental Injury: Teeth and Oropharynx as per pre-operative assessment

## 2012-11-15 NOTE — Anesthesia Postprocedure Evaluation (Signed)
Anesthesia Post Note  Patient: Amber Brooks  Procedure(s) Performed: Procedure(s) (LRB): CRANIOTOMY INTRACRANIAL ANEURYSM FOR CAROTID (Left)  Anesthesia type: General  Patient location: PACU  Post pain: Pain level controlled  Post assessment: Patient's Cardiovascular Status Stable  Last Vitals:  Filed Vitals:   11/15/12 1340  BP:   Pulse:   Temp: 36.2 C  Resp:     Post vital signs: Reviewed and stable  Level of consciousness: alert  Complications: No apparent anesthesia complications

## 2012-11-15 NOTE — Progress Notes (Signed)
Pt stating"ithurts, it hurts" and trying to stick fingers under turban. Pain med given, MD paged, mitten placed in left hand.

## 2012-11-15 NOTE — H&P (Signed)
BP 178/73  Pulse 58  Temp(Src) 97.5 F (36.4 C) (Oral)  Resp 16  SpO2 100%   HISTORY OF PRESENT ILLNESS:                     Amber Brooks is a 67 year old woman who is actually very well known to me.  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  At that time, it measured 4.5 x 3.5.  There was an attempted coiling of the aneurysm with stent-assist, which was unsuccessful, so she has had a stent since that time.  She was essentially lost to me afterwards, but recently when she was in the intensive care unit about a week-and-a-half ago, she happened to hear my voice and I went into the room.  Dr. Corliss Skains had readmitted her and thought that he might have a new device, which could be used for endovascular treatment of the aneurysm and has failed again.  Amber Brooks at this point would like to pursue surgical options.  She is 67.  She is a Sales executive and right-handed.   She has had two attempted coiling in April of 2010 and October of 2014.  She has never had any neurological problems.  She does have headaches.   REVIEW OF SYSTEMS:                                    Positive for eyeglasses, hypertension, heart murmur, hypercholesterolemia, indigestion, nausea, change in bowel habits, anxiety, and depression.  She denies allergic, hematologic, neurological, skin, musculoskeletal, genitourinary, respiratory, ear, nose, throat, mouth or constitutional problems.   PAST MEDICAL HISTORY:            Current Medical Conditions:  Significant for familial cerebral aneurysms.  Her sister is actually being treated at this time in [place] for ruptured aneurysm and she has had multiple other family members with aneurysm some of which have ruptured.  She does have a history of hypertension.            Prior Operations:  She has undergone a hysterectomy in the past.            Medications and Allergies:   SHE IS ALLERGIC TO BOTH SULFA AND LATEX.  Medications:  She takes alprazolam, 81 mg of aspirin, Effexor, estradiol, Micardis, nadolol, Nexium and Vytorin.   FAMILY HISTORY:                                            Mother and father both deceased with hypertension, aneurysm, and myocardial infarction.   SOCIAL HISTORY:                                            She does not smoke.  She does drink alcohol.  She does not use illicit drugs.   PHYSICAL EXAMINATION:                                She is 62 inches in height and weighs 127.6 pounds.  She has a BMI of 23.34.  Blood pressure is 179/89.  Pulse is 62.  She is alert and oriented x4 and answering all questions appropriately.  Memory, language, attention span and fund of knowledge are normal.  She is well kempt and in no distress.  Pupils are equal, round and reactive to light.  Full extraocular movements.  Full visual fields.  Symmetric facial movements and sensation.  Uvula elevates in the midline.  Shoulder shrug is normal.  Tongue protrudes in the midline.  There is no drift.  5/5 strength in the upper and lower extremities.  2+ reflexes in biceps, triceps, brachioradialis, knees, and ankles.  Lungs are clear.  Heart is regular rhythm and rate.   DIAGNOSTIC STUDIES:                                    Amber Brooks' latest angio was reviewed.  She does have a wide neck aneurysm, which is distal to the stent placed in the middle cerebral artery.  There are two vessels which emanate from the aneurysm.  It looks even when it is best clipped, that there may still be some residual portion of the vessel.   IMPRESSION AND PLAN:                                 We discussed craniotomy at length.  She at this time wants to have it treated because her sister again just had her aneurysm ruptured and has been in the hospital in [place].  She has been quite scooped by this incident.  She would have a left-sided pterional craniotomy and I would be working with an  Geophysicist/field seismologist and clip the aneurysm, we will make the attempt to do so.  I think she will be in the hospital two to three days.  If all went well, I would expect her to soon do well and not have any residual problems.  She understands and would like to proceed.  We will get this scheduled .  Risks including stroke, death, change in personality, weakness, paralysis, stroke, brain damage, inability to use arms, inability to control bowel and bladder function were also discussed.  She wishes to proceed.

## 2012-11-15 NOTE — Progress Notes (Signed)
PHARMACIST - PHYSICIAN ORDER COMMUNICATION  CONCERNING: P&T Medication Policy on Herbal Medications  DESCRIPTION:  This patient's order for:  Biotin  has been noted.  This product(s) is classified as an "herbal" or natural product. Due to a lack of definitive safety studies or FDA approval, nonstandard manufacturing practices, plus the potential risk of unknown drug-drug interactions while on inpatient medications, the Pharmacy and Therapeutics Committee does not permit the use of "herbal" or natural products of this type within [facility].   ACTION TAKEN: The pharmacy department is unable to verify this order at this time and your patient has been informed of this safety policy. Please reevaluate patient's clinical condition at discharge and address if the herbal or natural product(s) should be resumed at that time.  Georgina Pillion, PharmD, BCPS Clinical Pharmacist Pager: [phone] 11/15/2012 2:21 PM

## 2012-11-15 NOTE — Anesthesia Preprocedure Evaluation (Signed)
Anesthesia Evaluation  Patient identified by MRN, date of birth, ID band Patient awake    Reviewed: Allergy & Precautions, H&P , NPO status , Patient's Chart, lab work & pertinent test results, reviewed documented beta blocker date and time   History of Anesthesia Complications (+) PONV and history of anesthetic complications  Airway Mallampati: II TM Distance: >3 FB Neck ROM: full    Dental   Pulmonary neg pulmonary ROS,  breath sounds clear to auscultation        Cardiovascular hypertension, Pt. on home beta blockers and Pt. on medications + Peripheral Vascular Disease Rhythm:regular     Neuro/Psych  Headaches, PSYCHIATRIC DISORDERS Anxiety Depression    GI/Hepatic Neg liver ROS, GERD-  Medicated and Controlled,  Endo/Other  negative endocrine ROS  Renal/GU negative Renal ROS  negative genitourinary   Musculoskeletal   Abdominal   Peds  Hematology negative hematology ROS (+)   Anesthesia Other Findings See surgeon's H&P   Reproductive/Obstetrics negative OB ROS                           Anesthesia Physical Anesthesia Plan  ASA: III  Anesthesia Plan: General   Post-op Pain Management:    Induction: Intravenous  Airway Management Planned: Oral ETT  Additional Equipment: Arterial line and CVP  Intra-op Plan:   Post-operative Plan: Extubation in OR  Informed Consent: I have reviewed the patients History and Physical, chart, labs and discussed the procedure including the risks, benefits and alternatives for the proposed anesthesia with the patient or authorized representative who has indicated his/her understanding and acceptance.   Dental Advisory Given  Plan Discussed with: CRNA and Surgeon  Anesthesia Plan Comments:         Anesthesia Quick Evaluation

## 2012-11-16 MED ORDER — DIPHENHYDRAMINE HCL 25 MG PO CAPS
25.0000 mg | ORAL_CAPSULE | Freq: Four times a day (QID) | ORAL | Status: DC | PRN
  Administered 2012-11-16 – 2012-11-21 (×6): 25 mg via ORAL
  Filled 2012-11-16 (×7): qty 1

## 2012-11-16 MED ORDER — DIAZEPAM 5 MG PO TABS
5.0000 mg | ORAL_TABLET | Freq: Four times a day (QID) | ORAL | Status: DC | PRN
  Administered 2012-11-16 – 2012-11-20 (×4): 5 mg via ORAL
  Filled 2012-11-16 (×4): qty 1

## 2012-11-16 MED ORDER — BIOTENE DRY MOUTH MT LIQD
15.0000 mL | Freq: Two times a day (BID) | OROMUCOSAL | Status: DC
  Administered 2012-11-16 – 2012-11-19 (×7): 15 mL via OROMUCOSAL

## 2012-11-16 MED ORDER — OXYCODONE HCL 5 MG PO TABS
5.0000 mg | ORAL_TABLET | ORAL | Status: DC | PRN
  Administered 2012-11-16: 5 mg via ORAL
  Administered 2012-11-17 – 2012-11-21 (×12): 10 mg via ORAL
  Administered 2012-11-21 – 2012-11-22 (×2): 5 mg via ORAL
  Administered 2012-11-22: 10 mg via ORAL
  Filled 2012-11-16 (×4): qty 2
  Filled 2012-11-16: qty 1
  Filled 2012-11-16 (×8): qty 2
  Filled 2012-11-16: qty 1
  Filled 2012-11-16 (×3): qty 2

## 2012-11-16 NOTE — Progress Notes (Signed)
Patient ID: Amber Brooks, female   DOB: 1945/05/24, 67 y.o.   MRN: [id number] BP 137/52  Pulse 68  Temp(Src) 98.2 F (36.8 C) (Oral)  Resp 18  Ht 5\' 2"  (1.575 m)  Wt 58.3 kg (128 lb 8.5 oz)  BMI 23.50 kg/m2  SpO2 99% Alert and oriented x 4, speech is clear and fluent Perrl, full eom Tongue, uvula midline Moving all extremities well Dressing is dry and intact

## 2012-11-16 NOTE — Plan of Care (Signed)
Problem: Consults Goal: Diagnosis - Craniotomy Crani for clipping

## 2012-11-17 LAB — TYPE AND SCREEN
ABO/RH(D): B POS
Antibody Screen: NEGATIVE
Unit division: 0
Unit division: 0

## 2012-11-17 MED ORDER — LOPERAMIDE HCL 2 MG PO CAPS
2.0000 mg | ORAL_CAPSULE | ORAL | Status: DC | PRN
  Administered 2012-11-17: 2 mg via ORAL
  Filled 2012-11-17: qty 1

## 2012-11-17 MED ORDER — LEVETIRACETAM 500 MG PO TABS
500.0000 mg | ORAL_TABLET | Freq: Two times a day (BID) | ORAL | Status: DC
  Administered 2012-11-17 – 2012-11-22 (×10): 500 mg via ORAL
  Filled 2012-11-17 (×11): qty 1

## 2012-11-17 MED ORDER — PANTOPRAZOLE SODIUM 40 MG PO TBEC
40.0000 mg | DELAYED_RELEASE_TABLET | Freq: Every day | ORAL | Status: DC
  Administered 2012-11-17 – 2012-11-21 (×5): 40 mg via ORAL
  Filled 2012-11-17 (×3): qty 1

## 2012-11-17 NOTE — Progress Notes (Signed)
Report called, assessment,, medications, and care plan reviewed. Patient escorted to [address] [place] via wheelchair, family with patient.

## 2012-11-17 NOTE — Progress Notes (Signed)
Patient ID: Amber Brooks, female   DOB: Nov 12, 1945, 67 y.o.   MRN: [id number] Subjective:   The patient is alert and pleasant. She has no complaints except a mild headache and she wants to see her grandson.  Objective: Vital signs in last 24 hours: Temp:  [98.1 F (36.7 C)-98.5 F (36.9 C)] 98.5 F (36.9 C) (11/16 0100) Pulse Rate:  [59-76] 63 (11/16 0748) Resp:  [10-21] 14 (11/16 0748) BP: (101-162)/(44-67) 162/62 mmHg (11/16 0748) SpO2:  [90 %-99 %] 95 % (11/16 0748)  Intake/Output from previous day: 11/15 0701 - 11/16 0700 In: 2130 [I.V.:1920; IV Piggyback:210] Out: 1800 [Urine:1800] Intake/Output this shift: Total I/O In: -  Out: 750 [Urine:750]  Physical exam the patient is alert and oriented x3. She is moving all 4 extremities.  Lab Results: No results found for this basename: WBC, HGB, HCT, PLT,  in the last 72 hours BMET No results found for this basename: NA, K, CL, CO2, GLUCOSE, BUN, CREATININE, CALCIUM,  in the last 72 hours  Studies/Results: Dg Chest Port 1 View  11/15/2012   CLINICAL DATA:  Status post central line placement.  EXAM: PORTABLE CHEST - 1 VIEW  COMPARISON:  October 04, 2012.  FINDINGS: The patient has undergone interval placement of a right internal jugular venous catheter. The catheter tip lies in the region of the junction of the SVC with the right atrium. There is no evidence of a postprocedure pneumothorax or hemo thorax. The lungs are mildly hypoinflated but clear. The cardiac silhouette and pulmonary vascularity appear normal. The observed portions of the bony thorax exhibit no acute abnormality.  IMPRESSION: There is no evidence of a postprocedure complication following placement of a right internal jugular venous catheter.   Electronically Signed   By: David  [place]   On: 11/15/2012 13:24    Assessment/Plan: Postop day #2: The patient is doing well. We will transfer to the floor and continue to mobilize her with PT and OT.  LOS: 2 days      Loucille Takach D 11/17/2012, 8:27 AM

## 2012-11-17 NOTE — Progress Notes (Signed)
Pt removed her Turban dressing this AM. Incision site clean and dry, well approximated, multiple staples present.

## 2012-11-17 NOTE — Progress Notes (Signed)
Pt a transfer from NICU made comfortable in bed all needed help given  BP at 1820 was 167 /74 medicated with Labatalol 10 mg  and pain medication . Will continue to monitor effectiveness.

## 2012-11-17 NOTE — Evaluation (Signed)
Physical Therapy Evaluation Patient Details Name: Amber Brooks MRN: [id number] DOB: [birth year] Today's Date: 11/17/2012 Time: 1330-1405 PT Time Calculation (min): 35 min  PT Assessment / Plan / Recommendation History of Present Illness  Admitted with brain aneurysm; now s/p crani  Clinical Impression  Patient is s/p above surgery resulting in functional limitations due to the deficits listed below (see PT Problem List).  Patient will benefit from skilled PT to increase their independence and safety with mobility to allow discharge to the venue listed below.       PT Assessment  Patient needs continued PT services    Follow Up Recommendations  Home health PT;Supervision - Intermittent    Does the patient have the potential to tolerate intense rehabilitation      Barriers to Discharge Decreased caregiver support Will be home alone for portions of the day; Must be mod I to dc home    Equipment Recommendations  Rolling walker with 5" wheels;3in1 (PT)    Recommendations for Other Services OT consult   Frequency Min 4X/week    Precautions / Restrictions Precautions Precautions: Fall   Pertinent Vitals/Pain 9/10 pounding Headache; RN notified patient repositioned for comfort       Mobility  Bed Mobility Bed Mobility: Supine to Sit;Sitting - Scoot to Edge of Bed Supine to Sit: 5: Supervision;With rails;HOB elevated Sitting - Scoot to Edge of Bed: 5: Supervision Details for Bed Mobility Assistance: Overall smooth transition Transfers Transfers: Sit to Stand;Stand to Sit Sit to Stand: 4: Min assist;From bed;With upper extremity assist Stand to Sit: 4: Min assist;To chair/3-in-1;With upper extremity assist Details for Transfer Assistance: Requires min assist secondary to decr balance; pt with posterior lean at initial stand, and lost balance backwards, resulting in uncontrolled sit back to the bed; cues fro safety and hand palcement Ambulation/Gait Ambulation/Gait  Assistance: 4: Min guard Ambulation Distance (Feet): 30 Feet Assistive device: Rolling walker Ambulation/Gait Assistance Details: Moves impulsively, requiring assist with progression of RW, especially when RW is impeded with an obstacle, pt tending to "jerk" RW, increasing fall risk Gait Pattern: Decreased stride length General Gait Details: somewhat erratic step length    Exercises     PT Diagnosis: Difficulty walking;Acute pain  PT Problem List: Decreased activity tolerance;Decreased balance;Decreased mobility;Decreased coordination;Decreased knowledge of use of DME;Decreased safety awareness;Pain;Decreased knowledge of precautions PT Treatment Interventions: DME instruction;Gait training;Stair training;Functional mobility training;Therapeutic activities;Therapeutic exercise;Balance training;Neuromuscular re-education;Patient/family education     PT Goals(Current goals can be found in the care plan section) Acute Rehab PT Goals Patient Stated Goal: Safely get home PT Goal Formulation: With patient Time For Goal Achievement: 12/01/12 Potential to Achieve Goals: Good  Visit Information  Last PT Received On: 11/17/12 Assistance Needed: +1 History of Present Illness: Admitted with brain aneurysm; now s/p crani       Prior Functioning  Home Living Family/patient expects to be discharged to:: Private residence Living Arrangements: Children Available Help at Discharge: Family;Available PRN/intermittently Type of Home: House Home Access: Stairs to enter [employer] of Steps: flight (back door) Entrance Stairs-Rails: Left Home Layout: Two level Alternate Level Stairs-Number of Steps: Flight Alternate Level Stairs-Rails: Left Home Equipment: None Prior Function Level of Independence: Independent 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: No difficulties    Cognition  Cognition Arousal/Alertness: Awake/alert Behavior During Therapy: WFL for  tasks assessed/performed Overall Cognitive Status: Within Functional Limits for tasks assessed (though somewhat impulsive)    Extremity/Trunk Assessment Upper Extremity Assessment Upper Extremity Assessment: Overall WFL for tasks assessed  Lower Extremity Assessment Lower Extremity Assessment: Overall WFL for tasks assessed   Balance    End of Session PT - End of Session Activity Tolerance: Patient tolerated treatment well Patient left: in chair;with call bell/phone within reach;with family/visitor present Nurse Communication: Mobility status  GP     Van Clines [facility] [place], [place] [phone]  11/17/2012, 2:37 PM

## 2012-11-18 ENCOUNTER — Inpatient Hospital Stay (HOSPITAL_COMMUNITY): Payer: Medicare Other

## 2012-11-18 DIAGNOSIS — G9389 Other specified disorders of brain: Secondary | ICD-10-CM | POA: Diagnosis not present

## 2012-11-18 DIAGNOSIS — I671 Cerebral aneurysm, nonruptured: Secondary | ICD-10-CM | POA: Diagnosis present

## 2012-11-18 NOTE — Progress Notes (Signed)
PT Cancellation Note  Patient Details Name: Amber Brooks MRN: [id number] DOB: 1945/02/06   Cancelled Treatment:    Reason Eval/Treat Not Completed: Medical issues which prohibited therapy. Patient declining therapy at this time due to nausea/vomitting/dizziness. Will reattempt as able.    Fredrich Birks 11/18/2012, 9:52 AM

## 2012-11-19 MED ORDER — CLONIDINE HCL 0.1 MG PO TABS
0.1000 mg | ORAL_TABLET | Freq: Two times a day (BID) | ORAL | Status: DC
  Administered 2012-11-19 – 2012-11-22 (×7): 0.1 mg via ORAL
  Filled 2012-11-19 (×8): qty 1

## 2012-11-19 NOTE — Progress Notes (Signed)
Patient ID: Amber Brooks, female   DOB: [birth year], 67 y.o.   MRN: [id number] BP 166/63  Pulse 55  Temp(Src) 97.7 F (36.5 C) (Oral)  Resp 18  Ht 5\' 2"  (1.575 m)  Wt 58.3 kg (128 lb 8.5 oz)  BMI 23.50 kg/m2  SpO2 94% Alert and oriented x 4 Feels ill, diarrhea has subsided. "I just do not feel good. I cannot see out of my left eye" Moving all extremities well Wound is clean, dry, and without signs of infection.  Head CT looks very good. No edemA,infarcts

## 2012-11-19 NOTE — Evaluation (Signed)
Occupational Therapy Evaluation Patient Details Name: Amber Brooks MRN: [id number] DOB: June 17, 1945 Today's Date: 11/19/2012 Time: 4132-4401 OT Time Calculation (min): 22 min  OT Assessment / Plan / Recommendation History of present illness Admitted with brain aneurysm; now s/p crani   Clinical Impression   Pt presents with below problem list. Pt independent with ADLs, PTA. Pt will benefit from acute OT to increase independence prior to d/c.     OT Assessment  Patient needs continued OT Services    Follow Up Recommendations  CIR;Supervision/Assistance - 24 hour    Barriers to Discharge  Decreased caregiver support    Equipment Recommendations  Other (comment) (defer to next venue)    Recommendations for Other Services Rehab consult  Frequency  Min 2X/week    Precautions / Restrictions Precautions Precautions: Fall Restrictions Weight Bearing Restrictions: No   Pertinent Vitals/Pain Headache 9/10. Repositioned.     ADL  Grooming: Wash/dry hands;Supervision/safety Where Assessed - Grooming: Unsupported standing Upper Body Dressing: Set up Where Assessed - Upper Body Dressing: Unsupported sitting Lower Body Dressing: Min guard Where Assessed - Lower Body Dressing: Supported sit to stand Toilet Transfer: Hydrographic surveyor Method: Sit to Barista: Comfort height toilet Toileting - Architect and Hygiene: Min guard Where Assessed - Engineer, mining and Hygiene: Sit to stand from 3-in-1 or toilet Tub/Shower Transfer: Min guard Tub/Shower Transfer Method: Science writer: Shower seat with back Equipment Used: Gait belt;Rolling walker Transfers/Ambulation Related to ADLs: Min guard for ambulation and Min guard/supervision for transfers. While ambulating, pt drifting to left side. ADL Comments: Educated to sit to bathe and also to sit to get LB clothing over feet and to stand in front of  chair/bed with walker in front when pulling them up. Practiced tub transfer and pt at [facility] guard level with cues for safe technique. Educated on chair push ups to increase strength in UE's.    OT Diagnosis: Acute pain;Generalized weakness  OT Problem List: Decreased strength;Decreased activity tolerance;Impaired balance (sitting and/or standing);Impaired vision/perception;Decreased knowledge of use of DME or AE;Decreased knowledge of precautions;Pain OT Treatment Interventions: Self-care/ADL training;Therapeutic exercise;DME and/or AE instruction;Therapeutic activities;Visual/perceptual remediation/compensation;Patient/family education;Balance training   OT Goals(Current goals can be found in the care plan section) Acute Rehab OT Goals Patient Stated Goal: not stated OT Goal Formulation: With patient Time For Goal Achievement: 11/26/12 Potential to Achieve Goals: Good ADL Goals Pt Will Perform Lower Body Bathing: with modified independence;sit to/from stand Pt Will Perform Lower Body Dressing: with modified independence;sit to/from stand Pt Will Transfer to Toilet: with modified independence;ambulating;regular height toilet Pt Will Perform Toileting - Clothing Manipulation and hygiene: with modified independence;sit to/from stand Additional ADL Goal #1: Pt will be independent with HEP to increase strength in bilateral upper extremities.  Visit Information  Last OT Received On: 11/19/12 Assistance Needed: +1 History of Present Illness: Admitted with brain aneurysm; now s/p crani       Prior Functioning     Home Living Family/patient expects to be discharged to:: Private residence Living Arrangements: Children Available Help at Discharge: Family;Available PRN/intermittently Type of Home: House Home Access: Stairs to enter [employer] of Steps: flight Entrance Stairs-Rails: Left Home Layout: Two level Alternate Level Stairs-Number of Steps: Flight Alternate Level  Stairs-Rails: Left Home Equipment: None Prior Function Level of Independence: Independent 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 Lower Extremity Assessment Lower Extremity Assessment: Defer to PT evaluation     Mobility Bed Mobility Bed Mobility: Supine to Sit;Sit to Supine Supine to Sit: 5: Supervision;HOB elevated Sit to Supine: 5: Supervision Transfers Transfers: Sit to Stand;Stand to Sit Sit to Stand: 4: Min guard;From bed;From toilet Stand to Sit: 4: Min guard;5: Supervision;To bed;To toilet Details for Transfer Assistance: Cues to back to toilet until legs touch before sitting. Supervision for stand to sit to bed.     Exercise     Balance     End of Session OT - End of Session Equipment Utilized During Treatment: Gait belt;Rolling walker Activity Tolerance: Patient tolerated treatment well Patient left: in bed;with call bell/phone within reach  GO     Earlie Raveling OTR/L [phone] 11/19/2012, 4:59 PM

## 2012-11-19 NOTE — Progress Notes (Signed)
Agree with SPTA.    Meggan Dhaliwal, PT [phone]  

## 2012-11-19 NOTE — Progress Notes (Signed)
Patient ID: Amber Brooks, female   DOB: [birth year], 67 y.o.   MRN: [id number] BP 174/67  Pulse 54  Temp(Src) 98.1 F (36.7 C) (Oral)  Resp 20  Ht 5\' 2"  (1.575 m)  Wt 58.3 kg (128 lb 8.5 oz)  BMI 23.50 kg/m2  SpO2 96% Alert and oriented x 4 Speech is clear and fluent Perrl, full eom Symmetric facies tongue and uvula midline Wound is clean, dry, and without signs of infection Does not need CIR.

## 2012-11-19 NOTE — Progress Notes (Signed)
Rehab Admissions Coordinator Note:  Patient was screened by Clois Dupes for appropriateness for an Inpatient Acute Rehab Consult.  At this time, we are recommending Inpatient Rehab consult.  Clois Dupes 11/19/2012, 5:53 PM  I can be reached at [phone].

## 2012-11-19 NOTE — Progress Notes (Signed)
Physical Therapy Treatment Patient Details Name: Amber Brooks MRN: [id number] DOB: 10-Jan-1945 Today's Date: 11/19/2012 Time: 4098-1191 PT Time Calculation (min): 27 min  PT Assessment / Plan / Recommendation  History of Present Illness Admitted with brain aneurysm; now s/p crani   PT Comments   Pt required motivation to participate in PT this morning. Ambulation/stair training limited as noted.  Pt will not have family present/available once d/c'd home and pt not safe to go home as of now.  Believe that pt will achieve mod I within 10-14 days of CIR and would benefit to increase functional independence prior to d/c.  Follow Up Recommendations  CIR     Does the patient have the potential to tolerate intense rehabilitation     Barriers to Discharge        Equipment Recommendations       Recommendations for Other Services Rehab consult  Frequency Min 4X/week   Progress towards PT Goals Progress towards PT goals: Progressing toward goals  Plan Discharge plan needs to be updated    Precautions / Restrictions Precautions Precautions: Fall Restrictions Weight Bearing Restrictions: No   Pertinent Vitals/Pain 9/10 headache; nursing notified     Mobility  Bed Mobility Bed Mobility: Rolling Right;Right Sidelying to Sit;Sit to Sidelying Right;Sitting - Scoot to Edge of Bed Rolling Right: 5: Supervision Right Sidelying to Sit: HOB flat;5: Supervision Sitting - Scoot to Edge of Bed: 5: Supervision Sit to Sidelying Right: 5: Supervision Transfers Transfers: Sit to Stand;Stand to Sit Sit to Stand: 4: Min guard;From bed;From chair/3-in-1 Stand to Sit: 4: Min guard;To bed;To chair/3-in-1 Details for Transfer Assistance: Min guard for safety; cues to get closer to bed prior to turning around for safety; cues to open eyes  Ambulation/Gait Ambulation/Gait Assistance: 4: Min guard Ambulation Distance (Feet): 30 Feet Assistive device: Rolling walker Ambulation/Gait Assistance  Details: Pt moving slower; pt became dizzy/light-headed/double-vision once in hallway due to bright lights. Pt sat in recliner in hallway and was returned to room where she felt better and was able to ambulate to her bed.  cues to keep RW on floor instead of picking up Gait Pattern: Decreased stride length;Step-through pattern    Exercises     PT Diagnosis:    PT Problem List:   PT Treatment Interventions:     PT Goals (current goals can now be found in the care plan section)    Visit Information  Last PT Received On: 11/19/12 Assistance Needed: +1 History of Present Illness: Admitted with brain aneurysm; now s/p crani    Subjective Data      Cognition  Cognition Arousal/Alertness: Awake/alert Behavior During Therapy: WFL for tasks assessed/performed Overall Cognitive Status: Within Functional Limits for tasks assessed    Balance     End of Session PT - End of Session Equipment Utilized During Treatment: Gait belt Activity Tolerance: Treatment limited secondary to medical complications (Comment);Patient limited by pain (dizzy/light headed in hallway due to lights) Patient left: in bed;with call bell/phone within reach;with bed alarm set Nurse Communication: Mobility status;Patient requests pain meds   GP     Ernestina Columbia, SPTA 11/19/2012, 9:11 AM

## 2012-11-19 NOTE — Progress Notes (Signed)
UR complete.  Khyle Goodell RN, MSN 

## 2012-11-20 MED ORDER — DIAZEPAM 5 MG PO TABS
5.0000 mg | ORAL_TABLET | Freq: Four times a day (QID) | ORAL | Status: DC | PRN
  Administered 2012-11-20 – 2012-11-21 (×3): 5 mg via ORAL
  Filled 2012-11-20 (×3): qty 1

## 2012-11-20 NOTE — Progress Notes (Signed)
Patient called out to desk stated that she spilled coffee on her left hand.Upon entering patient room noticed patient rubbing left hand around thumb.Did not see any spilled coffee on gown ,covers,sheets ,or bedside table.Cool wash cloth given to patient to place over thumb.Then patient picked up coffee cup and placed cup on top of washcloth that was laying on her left hand.Asked patient to put cup down and informed patient that if she couldn't safety drink coffee it would be taken away.Left hand below thumb has small area that pink tinged .Will continue to monitor and report off to oncoming shift.

## 2012-11-20 NOTE — Progress Notes (Signed)
Patient ID: Amber Brooks, female   DOB: 1945-12-23, 67 y.o.   MRN: [id number] BP 147/56  Pulse 53  Temp(Src) 97.6 F (36.4 C) (Oral)  Resp 20  Ht 5\' 2"  (1.575 m)  Wt 58.3 kg (128 lb 8.5 oz)  BMI 23.50 kg/m2  SpO2 99% Alert and oriented x 4 Complaining of panic attacks Will try valium at night. Yesterday she stated she was going to be depressed because she had anesthesia and it always makes her depressed. Reports diplopia at times, I believe this is due to the periorbital edema. There was no dissection around the optic nerve whatsoever. The entire operation took place in the sylvian fissure distally. Wound is clean, and dry.

## 2012-11-20 NOTE — Progress Notes (Signed)
Clinical Social Work Department BRIEF PSYCHOSOCIAL ASSESSMENT 11/20/2012  Patient:  Amber Brooks, Amber Brooks     Account Number:  [id number]     Admit date:  11/15/2012  Clinical Social Worker:  Leron Croak, CLINICAL SOCIAL WORKER  Date/Time:  11/20/2012 04:41 PM  Referred by:  Physician  Date Referred:  11/20/2012 Referred for SNF Placement  Other Referral:   Interview type:  Patient Other interview type:    PSYCHOSOCIAL DATA Living Status:  FAMILY Admitted from facility:   Level of care:   Primary support name:  Amber Brooks [phone] Primary support relationship to patient:  CHILD, ADULT Degree of support available:   Pt has a good support system from daughter and friends.   CURRENT CONCERNS Current Concerns Post-Acute Placement  Other Concerns:    SOCIAL WORK ASSESSMENT / PLAN CSW met with the Pt at the bedside. CSW introduced self and reason for visit. Pt was alert and oriented x4. Pt was currently experiencing a headache and wanted to speak with CSW for a brief time so that Pt could rest. Pt stated that she lives at home with her daughter and moved in with her about 6 months ago. Pt stated that she enjoys living with them and that she "gets to speand time with her 40 year old grand child. Pt stated that she does plan to d/c back home with her daughter at d/c. Pt stated that her daughter does realestate and that she could be available to Pt as needed. Pt stated that she would like to do rehab, but would "rather do it at home so that Pt can get back to work". 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 Pt stated that if she does not get back to her job and home that she would "get depressed and needs to stay active". CSW encouraged Pt to consider rehab at SNF and if that were not agreeable to Pt, then to consider HHPT for rehab benefits. Pt stated that she would be agreeable to HHPT if she needed it. CSW will speak with daughter (per pt request) for more d/c  planning.  Assessment/plan status:  Information/Referral to [facility] Other assessment/ plan:   Information/referral to community resources:   None left with Pt at this time as Pt would like to go home,   PATIENT'S/FAMILY'S RESPONSE TO PLAN OF CARE: Pt was appreciative for assistance with rehab concerns and d/c planning.      Leron Croak [facility]  [address];  [phone] Phone: [phone]

## 2012-11-20 NOTE — Progress Notes (Signed)
PT Cancellation Note  Patient Details Name: Amber Brooks MRN: [id number] DOB: [birth year]   Cancelled Treatment:    Reason Eval/Treat Not Completed: Patient declined, no reason specified. Patient adamently refusing therapy stating as we walked in the door, "Im not doing that today". Patient is planning to DC home to her daugthers house where she will have 2 steps to enter. Spoke with daughter in length yesterday and she will be with patient at all time and will have assistance when needed. Will follow up with patient in AM   Robinette, Adline Potter 11/20/2012, 2:14 PM

## 2012-11-21 MED ORDER — DEXAMETHASONE 4 MG PO TABS
4.0000 mg | ORAL_TABLET | Freq: Two times a day (BID) | ORAL | Status: DC
  Administered 2012-11-22: 4 mg via ORAL
  Filled 2012-11-21 (×2): qty 1

## 2012-11-21 MED ORDER — CLONIDINE HCL 0.1 MG PO TABS: 0.1000 mg | ORAL_TABLET | Freq: Two times a day (BID) | ORAL | Status: DC

## 2012-11-21 NOTE — Progress Notes (Signed)
Physical Therapy Treatment Patient Details Name: SHENIYA GARCIAPEREZ MRN: [id number] DOB: [birth year] Today's Date: 11/21/2012 Time: 4098-1191 PT Time Calculation (min): 24 min  PT Assessment / Plan / Recommendation  History of Present Illness Admitted with brain aneurysm; now s/p crani   PT Comments   Patient initially reluctant to ambulate today but demonstrates improved activity tolerance today with ambulation. Patient still presents with some instability. Rec 24/7 supervision with HHPT upon discharge as patient states that she is going home.   Follow Up Recommendations  Home health PT (Patient states that she is going home, will need HHPT)           Equipment Recommendations  Rolling walker with 5" wheels;3in1 (PT)       Frequency Min 3X/week   Progress towards PT Goals Progress towards PT goals: Progressing toward goals  Plan Discharge plan needs to be updated    Precautions / Restrictions Precautions Precautions: Fall Restrictions Weight Bearing Restrictions: No   Pertinent Vitals/Pain No pain at this time     Mobility  Bed Mobility Bed Mobility: Supine to Sit;Sitting - Scoot to Edge of Bed;Sit to Supine Supine to Sit: 5: Supervision;HOB elevated Sitting - Scoot to Edge of Bed: 5: Supervision Sit to Supine: 5: Supervision Transfers Transfers: Sit to Stand;Stand to Sit Sit to Stand: 4: Min guard;From bed;From toilet Stand to Sit: 4: Min guard;5: Supervision;To bed;To toilet Details for Transfer Assistance: Cues to back to toilet until legs touch before sitting. Ambulation/Gait Ambulation/Gait Assistance: 5: Supervision;4: Min guard Ambulation Distance (Feet): 210 Feet Assistive device: Rolling walker Ambulation/Gait Assistance Details: VCs to increase cadence, some instability noted Gait Pattern: Decreased stride length;Step-through pattern General Gait Details: somewhat erratic step length      PT Goals (current goals can now be found in the care plan  section) Acute Rehab PT Goals Patient Stated Goal: not stated PT Goal Formulation: With patient Time For Goal Achievement: 12/01/12 Potential to Achieve Goals: Good  Visit Information  Last PT Received On: 11/21/12 Assistance Needed: +1 History of Present Illness: Admitted with brain aneurysm; now s/p crani    Subjective Data  Subjective: I think i will be okay to go home Patient Stated Goal: not stated   Cognition  Cognition Arousal/Alertness: Awake/alert Behavior During Therapy: WFL for tasks assessed/performed Overall Cognitive Status: Within Functional Limits for tasks assessed    Balance   Some instability noted   End of Session PT - End of Session Equipment Utilized During Treatment: Gait belt Activity Tolerance: Treatment limited secondary to medical complications (Comment);Patient limited by pain (dizzy/light headed in hallway due to lights) Patient left: in bed;with call bell/phone within reach;with bed alarm set Nurse Communication: Mobility status;Patient requests pain meds   GP     Fabio Asa 11/21/2012, 3:54 PM Charlotte Crumb, PT DPT  [phone]

## 2012-11-21 NOTE — Progress Notes (Signed)
Patient ID: Amber Brooks, female   DOB: 1945-12-21, 67 y.o.   MRN: [id number] BP 147/62  Pulse 58  Temp(Src) 97.2 F (36.2 C) (Oral)  Resp 18  Ht 5\' 2"  (1.575 m)  Wt 58.3 kg (128 lb 8.5 oz)  BMI 23.50 kg/m2  SpO2 98% Alert and oriented x 4 Discharge tomorrow Doing much better today Wound is clean, dry, no signs of infection.

## 2012-11-21 NOTE — Progress Notes (Signed)
Patient evaluated for community based chronic disease management services with [facility] Care Management Program as a benefit of patient's [employer]. Spoke with patient and daughter (primary caregiver and first contact) at bedside to explain [facility] Care Management services.  Patient was admitted on 11.14.14 by Dr Franky Macho for treatment of a cerebral aneurysm.  She has unstable HTN and anxiety that we will provide a RNCC and LCSW to assess.  She has a history of severe depression and is currently managed for this by her PCP.  She has attempted to self address her depression for years without great success.  Recommended journal writing, aroma therapy, and counseling at discharge.  Written consents obtained.  Patient will receive a post discharge transition of care call and will be evaluated for monthly home visits for assessments and disease process education.  Left contact information and [facility] literature at bedside. Made Inpatient Case Manager aware that [facility] Care Management following. Requested RNCM to assess the need for spiritual care service consult.  Of note, [facility] Care Management services does not replace or interfere with any services that are arranged by inpatient case management or social work.  For additional questions or referrals please contact Anibal Henderson BSN RN [facility] Liaison at [phone].

## 2012-11-21 NOTE — Care Management Note (Signed)
    Page 1 of 1   11/21/2012     4:47:50 PM   CARE MANAGEMENT NOTE 11/21/2012  Patient:  Amber Brooks, Amber Brooks   Account Number:  [id number]  Date Initiated:  11/20/2012  Documentation initiated by:  Elmer Bales  Subjective/Objective Assessment:   Patient admitted for cerebral angio clipping. Lives at home alone     Action/Plan:   Will follow for discharge needs   Anticipated DC Date:     Anticipated DC Plan:        DC Planning Services  CM consult      Choice offered to / List presented to:             Status of service:   Medicare Important Message given?   (If response is "NO", the following Medicare IM given date fields will be blank) Date Medicare IM given:   Date Additional Medicare IM given:    Discharge Disposition:    Per UR Regulation:    If discussed at Long Length of Stay Meetings, dates discussed:    Comments:  11/21/12 1645  Elmer Bales RN, MSN, CM- Spoke with Dr Franky Macho, who states patient will not need any home health or equipment at discharge.

## 2012-11-22 MED ORDER — HYDROCODONE-ACETAMINOPHEN 5-325 MG PO TABS: 1.0000 | ORAL_TABLET | Freq: Four times a day (QID) | ORAL | Status: DC | PRN

## 2012-11-22 MED ORDER — PROMETHAZINE HCL 12.5 MG PO TABS: 12.5000 mg | ORAL_TABLET | Freq: Four times a day (QID) | ORAL | Status: DC | PRN

## 2012-11-22 MED ORDER — LEVETIRACETAM 500 MG PO TABS: 500.0000 mg | ORAL_TABLET | Freq: Two times a day (BID) | ORAL | Status: DC

## 2012-11-22 NOTE — Discharge Summary (Signed)
Physician Discharge Summary  Patient ID: Amber Brooks MRN: [id number] DOB/AGE: 03-Dec-1945 67 y.o.  Admit date: 11/15/2012 Discharge date: 11/22/2012  Admission Diagnoses:Unruptured Left middle cerebral artery aneurysm   Discharge Diagnoses: Unruptured Left middle cerebral artery aneurysm  Principal Problem:   Aneurysm, cerebral, nonruptured   Discharged Condition: good  Hospital Course: Amber Brooks was admitted and taken to the operating room for an uncomplicated craniotomy for aneurysm clipping. Postop she has had a normal neurologic exam, her wound is clean, dry, and without signs of infection. She is tolerating a regular diet, voiding without difficulty, and ambulating.  Consults: None  Significant Diagnostic Studies: none  Treatments: surgery: Left pterional craniotomy for aneurysm clipping  Discharge Exam: Blood pressure 146/48, pulse 59, temperature 97.5 F (36.4 C), temperature source Oral, resp. rate 18, height 5\' 2"  (1.575 m), weight 58.3 kg (128 lb 8.5 oz), SpO2 98.00%. General appearance: alert, cooperative and appears stated age Neurologic: Alert and oriented X 3, normal strength and tone. Normal symmetric reflexes. Normal coordination and gait  Disposition: 01-Home or Self Care     Medication List         ALPRAZolam 0.5 MG tablet  Commonly known as:  XANAX  Take 0.5 mg by mouth at bedtime as needed for sleep or anxiety.     aspirin 81 MG chewable tablet  Chew 81 mg by mouth daily.     BENADRYL 25 MG tablet  Generic drug:  diphenhydrAMINE  Take 25 mg by mouth daily as needed for allergies.     Biotin 5000 MCG Caps  Take 3 capsules by mouth daily.     cloNIDine 0.1 MG tablet  Commonly known as:  CATAPRES  Take 1 tablet (0.1 mg total) by mouth 2 (two) times daily.     esomeprazole 40 MG capsule  Commonly known as:  NEXIUM  Take 40 mg by mouth daily before breakfast.     estradiol 1 MG tablet  Commonly known as:  ESTRACE  Take 0.5 mg by  mouth every other day.     ezetimibe-simvastatin 10-40 MG per tablet  Commonly known as:  VYTORIN  Take 1 tablet by mouth at bedtime.     HAIR/SKIN/NAILS PO  Take 1 tablet by mouth daily.     HYDROcodone-acetaminophen 5-325 MG per tablet  Commonly known as:  NORCO/VICODIN  Take 1 tablet by mouth every 6 (six) hours as needed for moderate pain.     ibuprofen 800 MG tablet  Commonly known as:  ADVIL,MOTRIN  Take 800 mg by mouth every 8 (eight) hours as needed for headache.     levETIRAcetam 500 MG tablet  Commonly known as:  KEPPRA  Take 1 tablet (500 mg total) by mouth 2 (two) times daily.     multivitamin with minerals Tabs tablet  Take 2 tablets by mouth daily. Gummy Vitamin     nadolol 40 MG tablet  Commonly known as:  CORGARD  Take 40 mg by mouth every morning.     promethazine 12.5 MG tablet  Commonly known as:  PHENERGAN  Take 1 tablet (12.5 mg total) by mouth every 6 (six) hours as needed for nausea.     telmisartan 80 MG tablet  Commonly known as:  MICARDIS  Take 80 mg by mouth daily.     venlafaxine 75 MG tablet  Commonly known as:  EFFEXOR  Take 75 mg by mouth 2 (two) times daily.     VITAMIN D3 PO  Take 2 tablets by mouth  daily.           Follow-up Information   Follow up with Buford Bremer L, MD. (call office to make appointment to remove staples next week)    Specialty:  Neurosurgery   Contact information:   [address]                         [address] [place] [address] [phone]       Signed: Liliana Brentlinger L 11/22/2012, 3:42 PM

## 2012-11-22 NOTE — Progress Notes (Signed)
Patient is discharged at this time from room 4N06. Alert and in stable condition. Requested order for Phenergan at discharge and Dr. Jordan Likes on call notified. Order placed. Instructions read to patient and daughter and understanding verbalized. Daughter took all belongings. Left unit via wheelchair.

## 2012-11-22 NOTE — Progress Notes (Signed)
Physical Therapy Treatment Patient Details Name: Amber Brooks MRN: [id number] DOB: May 04, 1945 Today's Date: 11/22/2012 Time: 4098-1191 PT Time Calculation (min): 26 min  PT Assessment / Plan / Recommendation  History of Present Illness Admitted with brain aneurysm; now s/p crani   PT Comments   Patient demonstrates improved activity tolerance today, worked with patient and daughter during ambulation and stair negotiation. Educated them on proper techniques for car transfers.  Spoke at length regarding safety concerns, expectations for mobility, and supervision needed.  Will continue to see as indicated, patient will need HHPT upon discharge.   Follow Up Recommendations  Home health PT (Patient states that she is going home, will need HHPT), Supervision for OOB           Equipment Recommendations  Rolling walker with 5" wheels;3in1 (PT)       Frequency Min 3X/week   Progress towards PT Goals Progress towards PT goals: Progressing toward goals  Plan Current plan remains appropriate    Precautions / Restrictions Precautions Precautions: Fall Restrictions Weight Bearing Restrictions: No   Pertinent Vitals/Pain No pain     Mobility  Bed Mobility Bed Mobility: Supine to Sit;Sitting - Scoot to Edge of Bed Supine to Sit: 5: Supervision;HOB elevated Sitting - Scoot to [place] of Bed: 5: Supervision Transfers Transfers: Sit to Stand;Stand to Sit Sit to Stand: 5: Supervision Stand to Sit: 5: Supervision Details for Transfer Assistance: Cues to back to toilet until legs touch before sitting. Ambulation/Gait Ambulation/Gait Assistance: 5: Supervision;4: Min guard Ambulation Distance (Feet): 400 Feet Assistive device: Rolling walker Ambulation/Gait Assistance Details: VCs for cadence and direction to task, as pt becomes distracted she becomes more unsteady, educated daughter on stability concerns Gait Pattern: Decreased stride length;Step-through pattern General Gait Details:  increased instability with fatigue Stairs: Yes Stairs Assistance: 5: Supervision;4: Min guard Stairs Assistance Details (indicate cue type and reason): VCs for slow and controlled navigation Stair Management Technique: One rail Right;Alternating pattern;Forwards Number of Stairs: 12    Exercises     PT Diagnosis:    PT Problem List:   PT Treatment Interventions:     PT Goals (current goals can now be found in the care plan section) Acute Rehab PT Goals Patient Stated Goal: not stated PT Goal Formulation: With patient Time For Goal Achievement: 12/01/12 Potential to Achieve Goals: Good  Visit Information  Last PT Received On: 11/22/12 Assistance Needed: +1 History of Present Illness: Admitted with brain aneurysm; now s/p crani    Subjective Data  Subjective: I am ready to go home Patient Stated Goal: not stated   Cognition  Cognition Arousal/Alertness: Awake/alert Behavior During Therapy: WFL for tasks assessed/performed Overall Cognitive Status: Within Functional Limits for tasks assessed    Balance   Continues with intermittent instability, family aware   End of Session PT - End of Session Equipment Utilized During Treatment: Gait belt Activity Tolerance: Treatment limited secondary to medical complications (Comment);Patient limited by pain Patient left: in chair;with call bell/phone within reach;with family/visitor present Nurse Communication: Mobility status;Patient requests pain meds   GP     Fabio Asa 11/22/2012, 12:10 PM Charlotte Crumb, PT DPT  [phone]

## 2012-11-26 ENCOUNTER — Encounter (HOSPITAL_COMMUNITY): Payer: Self-pay | Admitting: Neurosurgery

## 2012-12-04 DIAGNOSIS — J019 Acute sinusitis, unspecified: Secondary | ICD-10-CM | POA: Diagnosis not present

## 2012-12-04 DIAGNOSIS — R300 Dysuria: Secondary | ICD-10-CM | POA: Diagnosis not present

## 2013-01-23 DIAGNOSIS — R51 Headache: Secondary | ICD-10-CM | POA: Diagnosis not present

## 2013-01-23 DIAGNOSIS — R5383 Other fatigue: Secondary | ICD-10-CM | POA: Diagnosis not present

## 2013-01-23 DIAGNOSIS — Z1211 Encounter for screening for malignant neoplasm of colon: Secondary | ICD-10-CM | POA: Diagnosis not present

## 2013-01-23 DIAGNOSIS — R5381 Other malaise: Secondary | ICD-10-CM | POA: Diagnosis not present

## 2013-01-23 DIAGNOSIS — I10 Essential (primary) hypertension: Secondary | ICD-10-CM | POA: Diagnosis not present

## 2013-02-06 ENCOUNTER — Ambulatory Visit
Admission: RE | Admit: 2013-02-06 | Discharge: 2013-02-06 | Disposition: A | Discharge status: Home / Self Care | Payer: Medicare Other | Source: Ambulatory Visit | Attending: Family Medicine | Admitting: Family Medicine

## 2013-02-06 ENCOUNTER — Other Ambulatory Visit: Payer: Self-pay | Admitting: Family Medicine

## 2013-02-06 DIAGNOSIS — IMO0002 Reserved for concepts with insufficient information to code with codable children: Secondary | ICD-10-CM | POA: Diagnosis not present

## 2013-02-06 DIAGNOSIS — M25551 Pain in right hip: Secondary | ICD-10-CM

## 2013-02-06 DIAGNOSIS — M25559 Pain in unspecified hip: Secondary | ICD-10-CM | POA: Diagnosis not present

## 2013-02-06 DIAGNOSIS — I110 Hypertensive heart disease with heart failure: Secondary | ICD-10-CM | POA: Diagnosis not present

## 2013-02-06 DIAGNOSIS — M549 Dorsalgia, unspecified: Secondary | ICD-10-CM | POA: Diagnosis not present

## 2013-02-17 DIAGNOSIS — M899 Disorder of bone, unspecified: Secondary | ICD-10-CM | POA: Diagnosis not present

## 2013-02-17 DIAGNOSIS — M949 Disorder of cartilage, unspecified: Secondary | ICD-10-CM | POA: Diagnosis not present

## 2013-02-17 DIAGNOSIS — E2839 Other primary ovarian failure: Secondary | ICD-10-CM | POA: Diagnosis not present

## 2013-02-17 DIAGNOSIS — M810 Age-related osteoporosis without current pathological fracture: Secondary | ICD-10-CM | POA: Diagnosis not present

## 2013-03-19 DIAGNOSIS — I729 Aneurysm of unspecified site: Secondary | ICD-10-CM | POA: Diagnosis not present

## 2013-03-19 DIAGNOSIS — I110 Hypertensive heart disease with heart failure: Secondary | ICD-10-CM | POA: Diagnosis not present

## 2013-03-19 DIAGNOSIS — Z1211 Encounter for screening for malignant neoplasm of colon: Secondary | ICD-10-CM | POA: Diagnosis not present

## 2013-04-10 DIAGNOSIS — Z800 Family history of malignant neoplasm of digestive organs: Secondary | ICD-10-CM | POA: Diagnosis not present

## 2013-04-10 DIAGNOSIS — Z1211 Encounter for screening for malignant neoplasm of colon: Secondary | ICD-10-CM | POA: Diagnosis not present

## 2013-04-10 DIAGNOSIS — D126 Benign neoplasm of colon, unspecified: Secondary | ICD-10-CM | POA: Diagnosis not present

## 2013-04-14 DIAGNOSIS — R350 Frequency of micturition: Secondary | ICD-10-CM | POA: Diagnosis not present

## 2013-04-14 DIAGNOSIS — M549 Dorsalgia, unspecified: Secondary | ICD-10-CM | POA: Diagnosis not present

## 2013-07-07 DIAGNOSIS — M79609 Pain in unspecified limb: Secondary | ICD-10-CM | POA: Diagnosis not present

## 2013-08-06 DIAGNOSIS — M79609 Pain in unspecified limb: Secondary | ICD-10-CM | POA: Diagnosis not present

## 2013-08-06 DIAGNOSIS — M461 Sacroiliitis, not elsewhere classified: Secondary | ICD-10-CM | POA: Diagnosis not present

## 2013-10-06 DIAGNOSIS — F419 Anxiety disorder, unspecified: Secondary | ICD-10-CM | POA: Diagnosis not present

## 2013-10-06 DIAGNOSIS — R5383 Other fatigue: Secondary | ICD-10-CM | POA: Diagnosis not present

## 2013-10-06 DIAGNOSIS — I110 Hypertensive heart disease with heart failure: Secondary | ICD-10-CM | POA: Diagnosis not present

## 2013-10-06 DIAGNOSIS — E780 Pure hypercholesterolemia: Secondary | ICD-10-CM | POA: Diagnosis not present

## 2013-10-06 DIAGNOSIS — Z0000 Encounter for general adult medical examination without abnormal findings: Secondary | ICD-10-CM | POA: Diagnosis not present

## 2013-10-06 DIAGNOSIS — F329 Major depressive disorder, single episode, unspecified: Secondary | ICD-10-CM | POA: Diagnosis not present

## 2013-10-06 DIAGNOSIS — I519 Heart disease, unspecified: Secondary | ICD-10-CM | POA: Diagnosis not present

## 2013-10-06 DIAGNOSIS — N951 Menopausal and female climacteric states: Secondary | ICD-10-CM | POA: Diagnosis not present

## 2013-10-06 DIAGNOSIS — Z23 Encounter for immunization: Secondary | ICD-10-CM | POA: Diagnosis not present

## 2013-11-06 DIAGNOSIS — Z1231 Encounter for screening mammogram for malignant neoplasm of breast: Secondary | ICD-10-CM | POA: Diagnosis not present

## 2013-12-03 ENCOUNTER — Ambulatory Visit (INDEPENDENT_AMBULATORY_CARE_PROVIDER_SITE_OTHER): Payer: Medicare Other | Admitting: Neurology

## 2013-12-03 ENCOUNTER — Encounter: Payer: Self-pay | Admitting: Neurology

## 2013-12-03 VITALS — BP 138/66 | HR 67 | Resp 14 | Ht 62.0 in | Wt 119.5 lb

## 2013-12-03 DIAGNOSIS — R270 Ataxia, unspecified: Secondary | ICD-10-CM | POA: Diagnosis not present

## 2013-12-03 DIAGNOSIS — R413 Other amnesia: Secondary | ICD-10-CM | POA: Insufficient documentation

## 2013-12-03 DIAGNOSIS — I671 Cerebral aneurysm, nonruptured: Secondary | ICD-10-CM

## 2013-12-03 HISTORY — DX: Ataxia, unspecified: R27.0

## 2013-12-03 NOTE — Progress Notes (Addendum)
Provider:  Larey Seat, M D  Referring Provider: Mayra Neer, MD Primary Care Physician:  Mayra Neer, MD  Chief Complaint  Patient presents with  . NP Amber Brooks  Memory Loss    Rm 11, alone    HPI:  Amber Brooks is a 68 y.o. caucasian , right handed , divorced   female , seen here as a referral  from Dr. 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 She has been obsessive compulsive in her work arrangements, her tray at the dentist, and is a compulsive cleaner.  In November 2014 , she underwent a cerebral aneurysm surgery, and she feels strongly that since her memory has lapsed  more often.  She was changed in her personality. She suffered a temporary vision loss after surgery, and felt that she was off balance since. She had multiple falls, she gets frequently lost if she drives without GPS, which was not typical for her.  The aneurysm had been diagnosed in 2010, and Dr. Estanislado Pandy placed a stent in 2012.  In 2014 she had a CT scan upon order of Dr. Brigitte Brooks, after suffering worsening migraines.  Dr. Estanislado Pandy found the aneurysm to be enlarged now, but the stent couldn't help this time.  The patient is one of 54 siblings, and one brother had a sudden cardiac death ( no autopsy) . One sister has  aneurysmata, and had a spontaneus bleed. 2 other sisters were found with unruptured aneurysm.    Review of Systems: Out of a complete 14 system review, the patient complains of only the following symptoms, and all other reviewed systems are negative.  Sleep changes, insomnia, sleeps only after taking Xanax.  Appetite changes.  Migraine  OCD - she cannot tolerate things out of place. " neat freak" . She is not separating foods on her plate, she is driving only with navigator after  her move to her children's house.  She falls a lot recently, she stumbles.   History   Social History  . Marital Status: Divorced    Spouse Name: N/A    Number of Children: N/A  . Years of Education: N/A   Occupational History  . Not on file.   Social History Main Topics  . Smoking status: Never Smoker   . Smokeless tobacco: Not on file  . Alcohol Use: 0.0 oz/week    2-3 Glasses of wine per week  . Drug Use: No  . Sexual Activity: Not on file   Other Topics Concern  . Not on file   Social History Narrative   Right handed.  Caffeine decaf 1 cup in am.  Divorced.  1 adopted daughter.  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     History reviewed. No pertinent family history.  Past Medical History  Diagnosis Date  . Hypertension   . Brain aneurysm   . Mitral valve prolapse   . Complication of anesthesia   . PONV (postoperative nausea and vomiting)   . GERD (gastroesophageal reflux disease)   . Headache(784.0)     migraines  . Anxiety   . Depression     Past Surgical History  Procedure Laterality Date  . Tonsillectomy    . Abdominal hysterectomy  1987  . Vascular surgery  stent placed  . Radiology with anesthesia N/A 10/09/2012    Procedure: RADIOLOGY WITH ANESTHESIA;  Surgeon: Rob Hickman, MD;  Location: [place];  Service: Radiology;  Laterality: N/A;  . Ectopic pregnancy surgery    . Aneurysm coiling    . Craniotomy Left 11/15/2012    Procedure: CRANIOTOMY INTRACRANIAL ANEURYSM FOR CAROTID;  Surgeon: Winfield Cunas, MD;  Location: [place] [facility];  Service: Neurosurgery;  Laterality: Left;  LEFT Craniotomy for Aneurysm    Current Outpatient Prescriptions  Medication Sig Dispense Refill  . ALPRAZolam (XANAX) 0.5 MG tablet Take 0.5 mg by mouth at bedtime as needed for sleep or anxiety.    Marland Kitchen amLODipine (NORVASC) 10 MG tablet Take 10 mg by mouth daily.    . Biotin 5000 MCG CAPS Take 3 capsules by mouth daily.    . Cholecalciferol (VITAMIN D3 PO) Take 2 tablets by mouth  daily.     . Cyanocobalamin (VITAMIN B-12 CR) 1000 MCG TBCR Take by mouth.    . diphenhydrAMINE (BENADRYL) 25 MG tablet Take 25 mg by mouth daily as needed for allergies.    Marland Kitchen esomeprazole (NEXIUM) 40 MG capsule Take 40 mg by mouth daily before breakfast.    . estradiol (ESTRACE) 1 MG tablet Take 0.5 mg by mouth every other day.    . ezetimibe-simvastatin (VYTORIN) 10-40 MG per tablet Take 1 tablet by mouth at bedtime.     Marland Kitchen ibuprofen (ADVIL,MOTRIN) 800 MG tablet Take 800 mg by mouth every 8 (eight) hours as needed for headache.    . Magnesium 200 MG TABS Take by mouth.    . Multiple Vitamin (MULTIVITAMIN WITH MINERALS) TABS tablet Take 2 tablets by mouth daily. Gummy Vitamin    . nadolol (CORGARD) 40 MG tablet Take 40 mg by mouth every morning.     . promethazine (PHENERGAN) 12.5 MG tablet Take 1 tablet (12.5 mg total) by mouth every 6 (six) hours as needed for nausea. 30 tablet 0  . venlafaxine (EFFEXOR) 75 MG tablet Take 75 mg by mouth 2 (two) times daily.    Marland Kitchen aspirin 81 MG chewable tablet Chew 81 mg by mouth daily.     No current facility-administered medications for this visit.    Allergies as of 12/03/2013 - Review Complete 12/03/2013  Allergen Reaction Noted  . Latex Anaphylaxis 03/10/2010  . Codeine Nausea Only 03/10/2010  . Hydromorphone Itching 11/16/2012    Vitals: BP 138/66 mmHg  Brooks 67  Resp 14  Ht 5\' 2"  (1.575 m)  Wt 119 lb 8 oz (54.205 kg)  BMI 21.85 kg/m2 Last Weight:  Wt Readings from Last 1 Encounters:  12/03/13 119 lb 8 oz (54.205 kg)   Last Height:   Ht Readings from Last 1 Encounters:  12/03/13 5\' 2"  (1.575 m)    Physical exam:  General: The patient is awake, alert and appears not in acute distress. The patient is well groomed. Head: Normocephalic, atraumatic. Neck is supple. Mallampati 2 , neck circumference: 14  Cardiovascular:  Regular rate and rhythm , without  murmurs or carotid bruit, and without distended neck veins. Respiratory: Lungs are  clear to auscultation. Skin:  Without evidence of edema, or rash Trunk: normal posture.  Neurologic exam : The patient is awake and alert, oriented to place and time.  Memory subjective   described as impaired, but she feels under pressure and depressed at the same time.There is no normal attention span - she is giddy to some degree, restricted  concentration ability.  Speech is fluent , logorrhoeic , without dysarthria, dysphonia or aphasia. Mood and affect ; agitated.   MMSE 27-30, MOCA is 24/30 , she has trouble with multi step commands, has failed the rail making test and 3 of 5 recall words. She was good a serial sevens. .   Cranial nerves: Pupils are equal and briskly reactive to light. Funduscopic exam without evidence of pallor or edema, .  Extraocular movements in vertical and horizontal planes not smooth- but  without nystagmus.  Frontal pursuit system ?  Visual fields by finger perimetry are intact.  Hearing to finger rub intact. Facial sensation intact to fine touch. Facial motor strength is symmetric and tongue and uvula move midline. Tongue protrusion into either cheek is normal. Shoulder shrug is normal. Bilateral.   Motor exam:  Normal tone, muscle bulk and symmetric  strength in all extremities.  Sensory:  Fine touch, pinprick and vibration were tested in all extremities. Proprioception was normal.  Coordination: Rapid alternating movements in the fingers/hands were normal.  Finger-to-nose maneuver  normal without evidence of ataxia, dysmetria.  Mild tremor on action.   Gait and station: Patient walks without assistive device and is able unassisted to climb up to the exam table. Strength within normal limits. Stance is stable and normal.  Tandem gait is  Fragmented, ataxic- Romberg testing is positive .   Deep tendon reflexes: in the  upper and lower extremities are symmetric and intact. Babinski maneuver response is downgoing.   Assessment:  After physical and neurologic  examination, review of laboratory studies, imaging, neurophysiology testing and pre-existing records, assessment is that of :   Memory loss can be related to a gradual decline in compensatory mechanism , as this patient has dyslexia and OCD.   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   Plan:  Treatment plan and additional workup :   I prefer Dr Cyndy Freeze to repeat MRI/ MRA or CT angio- as needed. Aneurysm re -evaluation.  Patient declined- wants all imaging done here.   Ordered MRI and MRA - all clips are MRI compatible.  Referral to neuropsychologist for detailed testing battery.  Ataxia work up.  Labs CMET, B12 and CBC diff.      Asencion Partridge Jahquan Klugh MD 12/03/2013                      Patient ID: Amber Brooks, female   DOB: [birth year], 68 y.o.   MRN: [id number] BP 138/66 mmHg  Brooks 67  Resp 14  Ht 5\' 2"  (1.575 m)  Wt 119 lb 8 oz (54.205 kg)  BMI 21.85 kg/m2 Alert and oriented x 4 Complaining of panic attacks Will try valium at night. Yesterday she stated she was going to be depressed because she had anesthesia and it always makes her depressed. Reports diplopia at times, I believe this is due to the periorbital edema. There was no dissection around the optic nerve whatsoever. The entire operation took place in the sylvian fissure distally. Wound is clean, and dry.

## 2013-12-03 NOTE — Addendum Note (Signed)
Addended by: Larey Seat on: 12/03/2013 09:39 AM   Modules accepted: Orders

## 2013-12-03 NOTE — Patient Instructions (Signed)

## 2013-12-03 NOTE — Addendum Note (Signed)
Addended by: Larey Seat on: 12/03/2013 09:36 AM   Modules accepted: Orders

## 2013-12-18 ENCOUNTER — Other Ambulatory Visit: Payer: Medicare Other

## 2013-12-21 ENCOUNTER — Ambulatory Visit
Admission: RE | Admit: 2013-12-21 | Discharge: 2013-12-21 | Disposition: A | Discharge status: Home / Self Care | Payer: Medicare Other | Source: Ambulatory Visit | Attending: Neurology | Admitting: Neurology

## 2013-12-21 DIAGNOSIS — I671 Cerebral aneurysm, nonruptured: Secondary | ICD-10-CM | POA: Diagnosis not present

## 2013-12-21 DIAGNOSIS — R270 Ataxia, unspecified: Secondary | ICD-10-CM

## 2013-12-21 DIAGNOSIS — R413 Other amnesia: Secondary | ICD-10-CM

## 2013-12-21 MED ORDER — GADOBENATE DIMEGLUMINE 529 MG/ML IV SOLN
10.0000 mL | Freq: Once | INTRAVENOUS | Status: AC | PRN
  Administered 2013-12-21: 10 mL via INTRAVENOUS

## 2013-12-24 ENCOUNTER — Telehealth: Payer: Medicare Other | Admitting: Family

## 2013-12-24 DIAGNOSIS — J019 Acute sinusitis, unspecified: Secondary | ICD-10-CM

## 2013-12-24 MED ORDER — AMOXICILLIN-POT CLAVULANATE 875-125 MG PO TABS: 1.0000 | ORAL_TABLET | Freq: Two times a day (BID) | ORAL | Status: DC

## 2013-12-24 NOTE — Progress Notes (Signed)

## 2013-12-29 ENCOUNTER — Telehealth: Payer: Self-pay | Admitting: Neurology

## 2013-12-29 NOTE — Telephone Encounter (Signed)
Pt is calling to say she wants to be referred to [facility] instead of [facility].  She is also stating that she wants to know her MRI results. Please refer her to [facility] Neurology Clinical Dept [phone] Bea Laura.

## 2014-01-01 NOTE — Telephone Encounter (Signed)
Pt is calling back to request MRI results.  Please call and advise.

## 2014-01-01 NOTE — Telephone Encounter (Signed)
I called Amber Brooks and relayed the results of MRI/MRA (expected postoperative changes, edema improved) as per Dr. Edwena Felty result note.  Amber Brooks stated that she has appt with [facility], Dr. Lavone Neri, 06-29-14, but may get in sooner, she is waiting on a call.  I released the MRI/MRA results to her in my chart.  I asked her about Neuropsych referral and she relayed that she has appt at [facility].  Her pcp did the referral.    She also stated that she has hx migraines, has noted that she has had transient episodes 3-4 sec of bright light in R eye, then zigzag wavy lines.   This has been since last seen.  Has been coming and going for the last month.  I relayed that can have sx of migraine with out headache.  Would forward to Dr. Brett Fairy as Juluis Rainier.

## 2014-03-02 DIAGNOSIS — F0280 Dementia in other diseases classified elsewhere without behavioral disturbance: Secondary | ICD-10-CM | POA: Diagnosis not present

## 2014-03-02 DIAGNOSIS — R29818 Other symptoms and signs involving the nervous system: Secondary | ICD-10-CM | POA: Diagnosis not present

## 2014-03-04 ENCOUNTER — Telehealth: Payer: Self-pay

## 2014-03-04 NOTE — Telephone Encounter (Signed)
Called Amber Brooks and spoke to Amber Brooks. Amber Brooks just saw Dr. Melrose Nakayama at [place]. 03-03-14 . Dr. Melrose Nakayama told Amber Brooks that results not back for 2-3 weeks. Amber Brooks will call back to reschedule her apt. With Jinny Blossom - NP.

## 2014-03-05 ENCOUNTER — Ambulatory Visit: Payer: Medicare Other | Admitting: Adult Health

## 2014-04-06 ENCOUNTER — Emergency Department (HOSPITAL_COMMUNITY): Payer: Medicare Other

## 2014-04-06 ENCOUNTER — Encounter (HOSPITAL_COMMUNITY): Payer: Self-pay

## 2014-04-06 ENCOUNTER — Inpatient Hospital Stay (HOSPITAL_COMMUNITY)
Admission: EM | Admit: 2014-04-06 | Discharge: 2014-04-07 | DRG: 313 | Disposition: A | Discharge status: Home / Self Care | Payer: Medicare Other | Attending: Family Medicine | Admitting: Family Medicine

## 2014-04-06 DIAGNOSIS — I671 Cerebral aneurysm, nonruptured: Secondary | ICD-10-CM

## 2014-04-06 DIAGNOSIS — I10 Essential (primary) hypertension: Secondary | ICD-10-CM | POA: Diagnosis present

## 2014-04-06 DIAGNOSIS — K2100 Gastro-esophageal reflux disease with esophagitis, without bleeding: Secondary | ICD-10-CM

## 2014-04-06 DIAGNOSIS — F329 Major depressive disorder, single episode, unspecified: Secondary | ICD-10-CM

## 2014-04-06 DIAGNOSIS — K219 Gastro-esophageal reflux disease without esophagitis: Secondary | ICD-10-CM | POA: Diagnosis present

## 2014-04-06 DIAGNOSIS — R0789 Other chest pain: Principal | ICD-10-CM | POA: Diagnosis present

## 2014-04-06 DIAGNOSIS — R413 Other amnesia: Secondary | ICD-10-CM | POA: Diagnosis present

## 2014-04-06 DIAGNOSIS — R079 Chest pain, unspecified: Secondary | ICD-10-CM | POA: Diagnosis present

## 2014-04-06 DIAGNOSIS — R270 Ataxia, unspecified: Secondary | ICD-10-CM

## 2014-04-06 DIAGNOSIS — Z79899 Other long term (current) drug therapy: Secondary | ICD-10-CM | POA: Diagnosis not present

## 2014-04-06 DIAGNOSIS — F419 Anxiety disorder, unspecified: Secondary | ICD-10-CM | POA: Diagnosis not present

## 2014-04-06 DIAGNOSIS — I341 Nonrheumatic mitral (valve) prolapse: Secondary | ICD-10-CM | POA: Diagnosis not present

## 2014-04-06 DIAGNOSIS — Z7982 Long term (current) use of aspirin: Secondary | ICD-10-CM | POA: Diagnosis not present

## 2014-04-06 DIAGNOSIS — R0602 Shortness of breath: Secondary | ICD-10-CM | POA: Diagnosis not present

## 2014-04-06 DIAGNOSIS — F32A Depression, unspecified: Secondary | ICD-10-CM

## 2014-04-06 LAB — COMPREHENSIVE METABOLIC PANEL
ALT: 21 U/L (ref 0–35)
AST: 32 U/L (ref 0–37)
Albumin: 4.2 g/dL (ref 3.5–5.2)
Alkaline Phosphatase: 51 U/L (ref 39–117)
Anion gap: 12 (ref 5–15)
BUN: 12 mg/dL (ref 6–23)
CALCIUM: 9.6 mg/dL (ref 8.4–10.5)
CO2: 25 mmol/L (ref 19–32)
CREATININE: 0.67 mg/dL (ref 0.50–1.10)
Chloride: 103 mmol/L (ref 96–112)
GFR, EST NON AFRICAN AMERICAN: 89 mL/min — AB (ref >90)
GLUCOSE: 102 mg/dL — AB (ref 70–99)
Potassium: 3.9 mmol/L (ref 3.5–5.1)
Sodium: 140 mmol/L (ref 135–145)
Total Bilirubin: 0.8 mg/dL (ref 0.3–1.2)
Total Protein: 6.8 g/dL (ref 6.0–8.3)

## 2014-04-06 LAB — CBC WITH DIFFERENTIAL/PLATELET
BASOS PCT: 1 % (ref 0–1)
Basophils Absolute: 0 10*3/uL (ref 0.0–0.1)
Eosinophils Absolute: 0.2 10*3/uL (ref 0.0–0.7)
Eosinophils Relative: 3 % (ref 0–5)
HCT: 40.7 % (ref 36.0–46.0)
Hemoglobin: 13.7 g/dL (ref 12.0–15.0)
LYMPHS ABS: 1.8 10*3/uL (ref 0.7–4.0)
Lymphocytes Relative: 25 % (ref 12–46)
MCH: 31.1 pg (ref 26.0–34.0)
MCHC: 33.7 g/dL (ref 30.0–36.0)
MCV: 92.5 fL (ref 78.0–100.0)
Monocytes Absolute: 0.7 10*3/uL (ref 0.1–1.0)
Monocytes Relative: 10 % (ref 3–12)
Neutro Abs: 4.4 10*3/uL (ref 1.7–7.7)
Neutrophils Relative %: 61 % (ref 43–77)
Platelets: 254 10*3/uL (ref 150–400)
RBC: 4.4 MIL/uL (ref 3.87–5.11)
RDW: 12.8 % (ref 11.5–15.5)
WBC: 7.2 10*3/uL (ref 4.0–10.5)

## 2014-04-06 LAB — GLUCOSE, CAPILLARY: Glucose-Capillary: 90 mg/dL (ref 70–99)

## 2014-04-06 LAB — BRAIN NATRIURETIC PEPTIDE: B NATRIURETIC PEPTIDE 5: 41.5 pg/mL (ref 0.0–100.0)

## 2014-04-06 LAB — D-DIMER, QUANTITATIVE: D-Dimer, Quant: 0.28 ug/mL-FEU (ref 0.00–0.48)

## 2014-04-06 LAB — TROPONIN I: Troponin I: <0.03 ng/mL (ref <0.031)

## 2014-04-06 MED ORDER — EZETIMIBE-SIMVASTATIN 10-40 MG PO TABS
1.0000 | ORAL_TABLET | Freq: Every day | ORAL | Status: DC
  Administered 2014-04-06: 1 via ORAL
  Filled 2014-04-06 (×2): qty 1

## 2014-04-06 MED ORDER — AMLODIPINE BESYLATE 10 MG PO TABS
10.0000 mg | ORAL_TABLET | Freq: Every day | ORAL | Status: DC
  Administered 2014-04-07: 10 mg via ORAL
  Filled 2014-04-06: qty 1

## 2014-04-06 MED ORDER — PROMETHAZINE HCL 25 MG PO TABS
12.5000 mg | ORAL_TABLET | Freq: Four times a day (QID) | ORAL | Status: DC | PRN
  Administered 2014-04-06: 12.5 mg via ORAL
  Filled 2014-04-06: qty 1

## 2014-04-06 MED ORDER — MORPHINE SULFATE 2 MG/ML IJ SOLN
2.0000 mg | Freq: Once | INTRAMUSCULAR | Status: AC
  Administered 2014-04-06: 2 mg via INTRAVENOUS
  Filled 2014-04-06: qty 1

## 2014-04-06 MED ORDER — ASPIRIN 81 MG PO CHEW
81.0000 mg | CHEWABLE_TABLET | Freq: Every day | ORAL | Status: DC
  Administered 2014-04-07: 81 mg via ORAL
  Filled 2014-04-06: qty 1

## 2014-04-06 MED ORDER — IBUPROFEN 800 MG PO TABS
800.0000 mg | ORAL_TABLET | Freq: Three times a day (TID) | ORAL | Status: DC | PRN
  Administered 2014-04-07: 800 mg via ORAL
  Filled 2014-04-06: qty 1

## 2014-04-06 MED ORDER — ACETAMINOPHEN 650 MG RE SUPP: 650.0000 mg | Freq: Four times a day (QID) | RECTAL | Status: DC | PRN

## 2014-04-06 MED ORDER — MORPHINE SULFATE 2 MG/ML IJ SOLN
2.0000 mg | INTRAMUSCULAR | Status: DC | PRN
  Administered 2014-04-06: 2 mg via INTRAVENOUS
  Filled 2014-04-06: qty 1

## 2014-04-06 MED ORDER — ESTRADIOL 1 MG PO TABS
0.5000 mg | ORAL_TABLET | ORAL | Status: DC
  Administered 2014-04-07: 0.5 mg via ORAL
  Filled 2014-04-06: qty 0.5

## 2014-04-06 MED ORDER — ADULT MULTIVITAMIN W/MINERALS CH
2.0000 | ORAL_TABLET | Freq: Every day | ORAL | Status: DC
  Administered 2014-04-07: 2 via ORAL

## 2014-04-06 MED ORDER — VITAMIN B-12 1000 MCG PO TABS
1000.0000 ug | ORAL_TABLET | Freq: Every day | ORAL | Status: DC
  Administered 2014-04-07: 1000 ug via ORAL
  Filled 2014-04-06 (×2): qty 1

## 2014-04-06 MED ORDER — HEPARIN SODIUM (PORCINE) 5000 UNIT/ML IJ SOLN
5000.0000 [IU] | Freq: Three times a day (TID) | INTRAMUSCULAR | Status: DC
  Administered 2014-04-07: 5000 [IU] via SUBCUTANEOUS
  Filled 2014-04-06: qty 1

## 2014-04-06 MED ORDER — VENLAFAXINE HCL 75 MG PO TABS
75.0000 mg | ORAL_TABLET | Freq: Two times a day (BID) | ORAL | Status: DC
  Administered 2014-04-06 – 2014-04-07 (×2): 75 mg via ORAL
  Filled 2014-04-06 (×3): qty 1

## 2014-04-06 MED ORDER — ALPRAZOLAM 0.5 MG PO TABS: 0.5000 mg | ORAL_TABLET | Freq: Every evening | ORAL | Status: DC | PRN

## 2014-04-06 MED ORDER — MAGNESIUM OXIDE 400 (241.3 MG) MG PO TABS
200.0000 mg | ORAL_TABLET | Freq: Every day | ORAL | Status: DC
  Administered 2014-04-07: 200 mg via ORAL
  Filled 2014-04-06: qty 1

## 2014-04-06 MED ORDER — NITROGLYCERIN 0.4 MG SL SUBL: 0.4000 mg | SUBLINGUAL_TABLET | SUBLINGUAL | Status: DC | PRN

## 2014-04-06 MED ORDER — PANTOPRAZOLE SODIUM 40 MG PO TBEC
40.0000 mg | DELAYED_RELEASE_TABLET | Freq: Every day | ORAL | Status: DC
  Administered 2014-04-07: 40 mg via ORAL
  Filled 2014-04-06: qty 1

## 2014-04-06 MED ORDER — VITAMIN D3 25 MCG (1000 UNIT) PO TABS
1000.0000 [IU] | ORAL_TABLET | Freq: Every day | ORAL | Status: DC
  Administered 2014-04-07: 1000 [IU] via ORAL
  Filled 2014-04-06 (×2): qty 1

## 2014-04-06 MED ORDER — ONDANSETRON HCL 4 MG/2ML IJ SOLN
4.0000 mg | Freq: Once | INTRAMUSCULAR | Status: AC
  Administered 2014-04-06: 4 mg via INTRAVENOUS
  Filled 2014-04-06: qty 2

## 2014-04-06 MED ORDER — SODIUM CHLORIDE 0.9 % IJ SOLN
3.0000 mL | Freq: Two times a day (BID) | INTRAMUSCULAR | Status: DC
  Administered 2014-04-07: 3 mL via INTRAVENOUS

## 2014-04-06 MED ORDER — BIOTIN 5000 MCG PO CAPS: 3.0000 | ORAL_CAPSULE | Freq: Every day | ORAL | Status: DC

## 2014-04-06 MED ORDER — ALUM & MAG HYDROXIDE-SIMETH 200-200-20 MG/5ML PO SUSP: 30.0000 mL | Freq: Four times a day (QID) | ORAL | Status: DC | PRN

## 2014-04-06 MED ORDER — SODIUM CHLORIDE 0.9 % IV SOLN
INTRAVENOUS | Status: DC
  Administered 2014-04-07: via INTRAVENOUS

## 2014-04-06 MED ORDER — NADOLOL 40 MG PO TABS
40.0000 mg | ORAL_TABLET | Freq: Every morning | ORAL | Status: DC
  Filled 2014-04-06: qty 1

## 2014-04-06 MED ORDER — ACETAMINOPHEN 325 MG PO TABS
650.0000 mg | ORAL_TABLET | Freq: Four times a day (QID) | ORAL | Status: DC | PRN
Start: 2014-04-06 — End: 2014-04-07

## 2014-04-06 MED ORDER — DIPHENHYDRAMINE HCL 25 MG PO CAPS: 25.0000 mg | ORAL_CAPSULE | Freq: Every day | ORAL | Status: DC | PRN

## 2014-04-06 NOTE — H&P (Addendum)
[facility] History and Physical  [name] KYH:[id number] DOB: [birth year] DOA: 04/06/2014  Referring physician: ED physician PCP: Mayra Neer, MD  Specialists:   Chief Complaint: chest pain  HPI: Amber Brooks is a 69 y.o. female PMH of hypertension, GERD, migraine headache, ataxia, brain aneurysm (post status of surgery 2014), anxiety, depression, who presents with chest pain.  Patient reports that she has been having chest pain in the past 4 days. The chest pain is located in the substernal area, intermittent, radiating to the neck, jaw and left arm. It happened more than 10 times today, each time lasted approximately 2 minutes. Patient cannot tell exactly whether her chest pain is pleuritic or not. It is associated with the shortness of breath. She has "heart flipping feeling". No tenderness over calf areas. No recent long distance traveling.  Patient denies fever, chills, fatigue, cough, abdominal pain, diarrhea, constipation, dysuria, urgency, frequency, hematuria, skin rashes, joint pain or leg swelling. No unilateral weakness, numbness or tingling sensations. No vision change or hearing loss.  In ED, patient was found to have abnormal EKG with ST depression in inferior leads and V4 to V6, which is similar in pattern, but slightly worse than previous EKG on 10/10/12. Temperature normal, bradycardia, troponin negative, BNP 41.5, WBC 7.2, electrolytes okay. Chest x-ray is negative for acute abnormalities. Patient is admitted to inpatient for further evaluation and treatment.  Review of Systems: As presented in the history of presenting illness, rest negative.  Where does patient live?  At home Can patient participate in ADLs? Yes  Allergy:  Allergies  Allergen Reactions  . Latex Anaphylaxis  . Codeine Nausea Only  . Hydromorphone Itching    Past Medical History  Diagnosis Date  . Hypertension   . Brain aneurysm   . Mitral valve prolapse   . Complication of  anesthesia   . PONV (postoperative nausea and vomiting)   . GERD (gastroesophageal reflux disease)   . Headache(784.0)     migraines  . Anxiety   . Depression   . 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    Past Surgical History  Procedure Laterality Date  . Tonsillectomy    . Abdominal hysterectomy  1987  . Vascular surgery      stent placed  . Radiology with anesthesia N/A 10/09/2012    Procedure: RADIOLOGY WITH ANESTHESIA;  Surgeon: Rob Hickman, MD;  Location: [place];  Service: Radiology;  Laterality: N/A;  . Ectopic pregnancy surgery    . Aneurysm coiling    . Craniotomy Left 11/15/2012    Procedure: CRANIOTOMY INTRACRANIAL ANEURYSM FOR CAROTID;  Surgeon: Winfield Cunas, MD;  Location: [place] [facility];  Service: Neurosurgery;  Laterality: Left;  LEFT Craniotomy for Aneurysm    Social History:  reports that she has never smoked. She does not have any smokeless tobacco history on file. She reports that she drinks alcohol. She reports that she does not use illicit drugs.  Family History:  Family History  Problem Relation Age of Onset  . Dementia Father   . Dementia Brother      Prior to Admission medications   Medication Sig Start Date End Date Taking? Authorizing Provider  ALPRAZolam Duanne Moron) 0.5 MG tablet Take 0.5 mg by mouth at bedtime as needed for sleep or anxiety.   Yes Historical Provider, MD  amLODipine (NORVASC) 10 MG tablet Take 10 mg by mouth daily.   Yes Historical Provider, MD  aspirin 81 MG chewable tablet Chew 81 mg by mouth daily.   Yes Historical  Provider, MD  Biotin 5000 MCG CAPS Take 3 capsules by mouth daily.   Yes Historical Provider, MD  Cholecalciferol (VITAMIN D3 PO) Take 2 tablets by mouth daily.    Yes Historical Provider, MD  Cyanocobalamin (VITAMIN B-12 CR) 1000 MCG TBCR Take by mouth.   Yes Historical Provider, MD  diphenhydrAMINE (BENADRYL) 25 MG tablet Take 25 mg by mouth daily as needed for allergies.   Yes Historical Provider, MD  esomeprazole (NEXIUM) 40 MG  capsule Take 40 mg by mouth daily before breakfast.   Yes Historical Provider, MD  estradiol (ESTRACE) 1 MG tablet Take 0.5 mg by mouth every other day.   Yes Historical Provider, MD  ezetimibe-simvastatin (VYTORIN) 10-40 MG per tablet Take 1 tablet by mouth at bedtime.    Yes Historical Provider, MD  ibuprofen (ADVIL,MOTRIN) 800 MG tablet Take 800 mg by mouth every 8 (eight) hours as needed for headache.   Yes Historical Provider, MD  Magnesium 200 MG TABS Take by mouth.   Yes Historical Provider, MD  Multiple Vitamin (MULTIVITAMIN WITH MINERALS) TABS tablet Take 2 tablets by mouth daily. Gummy Vitamin   Yes Historical Provider, MD  nadolol (CORGARD) 40 MG tablet Take 40 mg by mouth every morning.    Yes Historical Provider, MD  venlafaxine (EFFEXOR) 75 MG tablet Take 75 mg by mouth 2 (two) times daily.   Yes Historical Provider, MD  amoxicillin-clavulanate (AUGMENTIN) 875-125 MG per tablet Take 1 tablet by mouth 2 (two) times daily. 12/24/13   Sharion Balloon, FNP  promethazine (PHENERGAN) 12.5 MG tablet Take 1 tablet (12.5 mg total) by mouth every 6 (six) hours as needed for nausea. 10/10/12   Hedy Jacob, PA-C    Physical Exam: Filed Vitals:   04/06/14 1930 04/06/14 2030 04/06/14 2130 04/06/14 2200  BP: 157/64 143/67 141/68 149/52  Pulse: 61 63 60 61  Temp:      TempSrc:      Resp: 12 17 19 23   SpO2: 100% 100% 100% 100%   General: Not in acute distress HEENT:       Eyes: PERRL, EOMI, no scleral icterus       ENT: No discharge from the ears and nose, no pharynx injection, no tonsillar enlargement.        Neck: No JVD, no bruit, no mass felt. Cardiac: S1/S2, RRR, No murmurs, No gallops or rubs Pulm: Good air movement bilaterally. Clear to auscultation bilaterally. No rales, wheezing, rhonchi or rubs. Abd: Soft, nondistended, nontender, no rebound pain, no organomegaly, BS present Ext: No edema bilaterally. 2+DP/PT pulse bilaterally Musculoskeletal: No joint deformities, erythema,  or stiffness, ROM full Skin: No rashes.  Neuro: Alert and oriented X3, cranial nerves II-XII grossly intact, muscle strength 5/5 in all extremeties, sensation to light touch intact.  Psych: Patient is not psychotic, no suicidal or hemocidal ideation.  Labs on Admission:  Basic Metabolic Panel:  Recent Labs Lab 04/06/14 1857  NA 140  K 3.9  CL 103  CO2 25  GLUCOSE 102*  BUN 12  CREATININE 0.67  CALCIUM 9.6   Liver Function Tests:  Recent Labs Lab 04/06/14 1857  AST 32  ALT 21  ALKPHOS 51  BILITOT 0.8  PROT 6.8  ALBUMIN 4.2   No results for input(s): LIPASE, AMYLASE in the last 168 hours. No results for input(s): AMMONIA in the last 168 hours. CBC:  Recent Labs Lab 04/06/14 1857  WBC 7.2  NEUTROABS 4.4  HGB 13.7  HCT 40.7  MCV 92.5  PLT 254   Cardiac Enzymes:  Recent Labs Lab 04/06/14 1857  TROPONINI <0.03    BNP (last 3 results)  Recent Labs  04/06/14 1857  BNP 41.5    ProBNP (last 3 results) No results for input(s): PROBNP in the last 8760 hours.  CBG: No results for input(s): GLUCAP in the last 168 hours.  Radiological Exams on Admission: Dg Chest 2 View  04/06/2014   CLINICAL DATA:  Heart palpitations and chest pain since Thursday.  EXAM: CHEST  2 VIEW  COMPARISON:  11/15/2012  FINDINGS: The cardiac silhouette, mediastinal and hilar contours are normal. The lungs are clear. No pleural effusion. The bony thorax is intact.  IMPRESSION: Normal chest x-ray   Electronically Signed   By: Marijo Sanes M.D.   On: 04/06/2014 19:28    EKG: Independently reviewed. ST depression in inferior leads and V4 to V6, which is similar in pattern, but slightly worse than previous EKG on 10/10/12.   Assessment/Plan Principal Problem:   Chest pain Active Problems:   Aneurysm, cerebral, nonruptured   Memory loss of unknown cause   Ataxia   Hypertension   Mitral valve prolapse   GERD (gastroesophageal reflux disease)   Anxiety   Depression  Chest pain:  Patient has atypical chest pain, will admit for chest pain rule out. She is taking Estradiol at home, which increases the risk of getting DVT, will check D-dimer to r/o DVT and PE. No pneumonia on chest x-ray. Initial troponin is negative. EKG is abnormal with T-wave inversion.   - will admit to Tele bed  - cycle CE q6 x3 and repeat her EKG in the am  - Nitroglycerin, Morphine, and aspirin, Tytorin - continue home Nadalolol  - check UDS - Risk factor stratification: will check FLP and A1C  - 2d echo - Stat D-dimer, if positive, will get CTA to r/o PE - Protonix and Mylanta for possible acid reflux  History of brain aneurysm:s/p of surgery 2014. Stable. -no new issues  GERD -Protonix  Hypertension: -Continue amlodipine -Nadolol  Anxiety, depression: Stable. No suicidal or homicidal ideations. -Continue home medications, Effexor, Xanax,     DVT ppx: SQ Heparin    Code Status: Full code Family Communication:    Yes, patient's   Friend (Man)    at bed side Disposition Plan: Admit to inpatient   Date of Service 04/06/2014    Ivor Costa [facility] Pager [phone]  If 7PM-7AM, please contact night-coverage [email or web address] Password [email or web address] 04/06/2014, 10:20 PM

## 2014-04-06 NOTE — ED Provider Notes (Signed)
CSN: [id number]     Arrival date & time 04/06/14  1734 History   First MD Initiated Contact with Patient 04/06/14 1735     Chief Complaint  Patient presents with  . Chest Pain     (Consider location/radiation/quality/duration/timing/severity/associated sxs/prior Treatment) HPI Comments: Patient presents to the ER for evaluation of chest pain. Patient reports that she has been expressing intermittent episodes of heaviness, "like something sitting on my chest" for the last 4 days. Initially the symptoms would come on and last for only approximately 10 minutes and then resolved. During the periods where she had pain, she did feel short of breath and feel fluttering in her chest. Pain radiates to the neck and jaw area. Today, however, symptoms have worsened. She has not had resolution completely between the episodes. She has nausea and diaphoresis associated with the symptoms in addition to the heaviness and shortness of breath. She comes to the ER by ambulance from her primary care doctor's office. She was given one sublingual nitroglycerin. She developed a severe headache and her chest pain seemed to worsen with the nitroglycerin. She has also been given aspirin.  She does report fairly significant family history of heart disease including her father and sister.  Patient is a 69 y.o. female presenting with chest pain.  Chest Pain Associated symptoms: diaphoresis, nausea and shortness of breath     Past Medical History  Diagnosis Date  . Hypertension   . Brain aneurysm   . Mitral valve prolapse   . Complication of anesthesia   . PONV (postoperative nausea and vomiting)   . GERD (gastroesophageal reflux disease)   . Headache(784.0)     migraines  . Anxiety   . Depression   . 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 Tonsillectomy    . Abdominal hysterectomy  1987  . Vascular surgery      stent placed  . Radiology with anesthesia N/A 10/09/2012   Procedure: RADIOLOGY WITH ANESTHESIA;  Surgeon: Rob Hickman, MD;  Location: [facility];  Service: Radiology;  Laterality: N/A;  . Ectopic pregnancy surgery    . Aneurysm coiling    . Craniotomy Left 11/15/2012    Procedure: CRANIOTOMY INTRACRANIAL ANEURYSM FOR CAROTID;  Surgeon: Winfield Cunas, MD;  Location: [place] [facility];  Service: Neurosurgery;  Laterality: Left;  LEFT Craniotomy for Aneurysm   Family History  Problem Relation Age of Onset  . Dementia Father   . Dementia Brother    History  Substance Use Topics  . Smoking status: Never Smoker   . Smokeless tobacco: Not on file  . Alcohol Use: 0.0 oz/week    2-3 Glasses of wine per week   OB History    No data available     Review of Systems  Constitutional: Positive for diaphoresis.  Respiratory: Positive for shortness of breath.   Cardiovascular: Positive for chest pain.  Gastrointestinal: Positive for nausea.  All other systems reviewed and are negative.     Allergies  Latex; Codeine; and Hydromorphone  Home Medications   Prior to Admission medications   Medication Sig Start Date End Date Taking? Authorizing Provider  ALPRAZolam Duanne Moron) 0.5 MG tablet Take 0.5 mg by mouth at bedtime as needed for sleep or anxiety.   Yes Historical Provider, MD  amLODipine (NORVASC) 10 MG tablet Take 10 mg by mouth daily.   Yes Historical Provider, MD  aspirin 81 MG chewable tablet Chew 81 mg by mouth daily.  Yes Historical Provider, MD  Biotin 5000 MCG CAPS Take 3 capsules by mouth daily.   Yes Historical Provider, MD  Cholecalciferol (VITAMIN D3 PO) Take 2 tablets by mouth daily.    Yes Historical Provider, MD  Cyanocobalamin (VITAMIN B-12 CR) 1000 MCG TBCR Take by mouth.   Yes Historical Provider, MD  diphenhydrAMINE (BENADRYL) 25 MG tablet Take 25 mg by mouth daily as needed for allergies.   Yes Historical Provider, MD  esomeprazole (NEXIUM) 40 MG capsule Take 40 mg by mouth daily before breakfast.   Yes Historical Provider, MD   estradiol (ESTRACE) 1 MG tablet Take 0.5 mg by mouth every other day.   Yes Historical Provider, MD  ezetimibe-simvastatin (VYTORIN) 10-40 MG per tablet Take 1 tablet by mouth at bedtime.    Yes Historical Provider, MD  ibuprofen (ADVIL,MOTRIN) 800 MG tablet Take 800 mg by mouth every 8 (eight) hours as needed for headache.   Yes Historical Provider, MD  Magnesium 200 MG TABS Take by mouth.   Yes Historical Provider, MD  Multiple Vitamin (MULTIVITAMIN WITH MINERALS) TABS tablet Take 2 tablets by mouth daily. Gummy Vitamin   Yes Historical Provider, MD  nadolol (CORGARD) 40 MG tablet Take 40 mg by mouth every morning.    Yes Historical Provider, MD  venlafaxine (EFFEXOR) 75 MG tablet Take 75 mg by mouth 2 (two) times daily.   Yes Historical Provider, MD  amoxicillin-clavulanate (AUGMENTIN) 875-125 MG per tablet Take 1 tablet by mouth 2 (two) times daily. 12/24/13   Sharion Balloon, FNP  promethazine (PHENERGAN) 12.5 MG tablet Take 1 tablet (12.5 mg total) by mouth every 6 (six) hours as needed for nausea. 10/10/12   Hedy Jacob, PA-C   BP 143/67 mmHg  Pulse 63  Temp(Src) 98 F (36.7 C) (Oral)  Resp 17  SpO2 100% Physical Exam  Constitutional: She is oriented to person, place, and time. She appears well-developed and well-nourished. No distress.  HENT:  Head: Normocephalic and atraumatic.  Right Ear: Hearing normal.  Left Ear: Hearing normal.  Nose: Nose normal.  Mouth/Throat: Oropharynx is clear and moist and mucous membranes are normal.  Eyes: Conjunctivae and EOM are normal. Pupils are equal, round, and reactive to light.  Neck: Normal range of motion. Neck supple.  Cardiovascular: Regular rhythm, S1 normal and S2 normal.  Exam reveals no gallop and no friction rub.   No murmur heard. Pulmonary/Chest: Effort normal and breath sounds normal. No respiratory distress. She exhibits no tenderness.  Abdominal: Soft. Normal appearance and bowel sounds are normal. There is no  hepatosplenomegaly. There is no tenderness. There is no rebound, no guarding, no tenderness at McBurney's point and negative Murphy's sign. No hernia.  Musculoskeletal: Normal range of motion.  Neurological: She is alert and oriented to person, place, and time. She has normal strength. No cranial nerve deficit or sensory deficit. Coordination normal. GCS eye subscore is 4. GCS verbal subscore is 5. GCS motor subscore is 6.  Skin: Skin is warm, dry and intact. No rash noted. No cyanosis.  Psychiatric: She has a normal mood and affect. Her speech is normal and behavior is normal. Thought content normal.  Nursing note and vitals reviewed.   ED Course  Procedures (including critical care time) Labs Review Labs Reviewed  COMPREHENSIVE METABOLIC PANEL - Abnormal; Notable for the following:    Glucose, Bld 102 (*)    GFR calc non Af Amer 89 (*)    All other components within normal limits  CBC  WITH DIFFERENTIAL/PLATELET  TROPONIN I  BRAIN NATRIURETIC PEPTIDE    Imaging Review Dg Chest 2 View  04/06/2014   CLINICAL DATA:  Heart palpitations and chest pain since Thursday.  EXAM: CHEST  2 VIEW  COMPARISON:  11/15/2012  FINDINGS: The cardiac silhouette, mediastinal and hilar contours are normal. The lungs are clear. No pleural effusion. The bony thorax is intact.  IMPRESSION: Normal chest x-ray   Electronically Signed   By: Marijo Sanes M.D.   On: 04/06/2014 19:28     EKG Interpretation   Date/Time:  Monday April 06 2014 17:48:51 EDT Ventricular Rate:  63 PR Interval:  174 QRS Duration: 77 QT Interval:  427 QTC Calculation: 437 R Axis:   67 Text Interpretation:  Sinus rhythm ST depression  Inferolateral leads No  significant change since last tracing Confirmed by Amram Maya  MD,  Jeb Schloemer [phone]) on 04/06/2014 6:17:20 PM      MDM   Final diagnoses:  Chest pain    Patient presents to the ER for evaluation of chest pain that has been ongoing for 4 days. Initially she was having brief  episodes of substernal chest pressure and heaviness that would last for several minutes and then resolved. This was not related to exertion, she did not identify any causative factors. Today, however, the pain has been more consistent. She has not been having as much relief between the episodes. They're occurring more frequently. Her workup thus far has been unrevealing. Blood pressure is slightly elevated. Heart rate is normal. EKG does show slight depressions in the inferior and lateral leads, but this was present in a similar pattern in 2010.  Patient's troponin is negative. She has had intermittent episodes of chest discomfort here but no continuous pain. In between she is pain-free. She will require hospitalization for further observation and management.    Orpah Greek, MD 04/06/14 2155

## 2014-04-06 NOTE — ED Notes (Signed)
MD at bedside. 

## 2014-04-06 NOTE — ED Notes (Signed)
Pt. Having CP starting Thursday. Midsternal pressure intermittently with jaw/neck radiation. Some SOB/nausea/diaphoresis. Pt. Given 1 Nitro and 324 ASA.

## 2014-04-07 ENCOUNTER — Inpatient Hospital Stay (HOSPITAL_COMMUNITY): Payer: Medicare Other

## 2014-04-07 DIAGNOSIS — I341 Nonrheumatic mitral (valve) prolapse: Secondary | ICD-10-CM

## 2014-04-07 DIAGNOSIS — R413 Other amnesia: Secondary | ICD-10-CM

## 2014-04-07 DIAGNOSIS — R270 Ataxia, unspecified: Secondary | ICD-10-CM

## 2014-04-07 DIAGNOSIS — K219 Gastro-esophageal reflux disease without esophagitis: Secondary | ICD-10-CM

## 2014-04-07 DIAGNOSIS — R079 Chest pain, unspecified: Secondary | ICD-10-CM

## 2014-04-07 LAB — RAPID URINE DRUG SCREEN, HOSP PERFORMED
Amphetamines: NOT DETECTED
BENZODIAZEPINES: POSITIVE — AB
Barbiturates: NOT DETECTED
Cocaine: NOT DETECTED
Opiates: POSITIVE — AB
Tetrahydrocannabinol: NOT DETECTED

## 2014-04-07 LAB — LIPID PANEL
CHOL/HDL RATIO: 3.3 ratio
Cholesterol: 201 mg/dL — ABNORMAL HIGH (ref 0–200)
HDL: 61 mg/dL (ref >39)
LDL Cholesterol: 107 mg/dL — ABNORMAL HIGH (ref 0–99)
Triglycerides: 163 mg/dL — ABNORMAL HIGH (ref <150)
VLDL: 33 mg/dL (ref 0–40)

## 2014-04-07 LAB — TROPONIN I
Troponin I: <0.03 ng/mL (ref <0.031)
Troponin I: <0.03 ng/mL (ref <0.031)

## 2014-04-07 LAB — CBC
HEMATOCRIT: 38.1 % (ref 36.0–46.0)
Hemoglobin: 12.6 g/dL (ref 12.0–15.0)
MCH: 30.7 pg (ref 26.0–34.0)
MCHC: 33.1 g/dL (ref 30.0–36.0)
MCV: 92.9 fL (ref 78.0–100.0)
PLATELETS: 222 10*3/uL (ref 150–400)
RBC: 4.1 MIL/uL (ref 3.87–5.11)
RDW: 12.8 % (ref 11.5–15.5)
WBC: 5.9 10*3/uL (ref 4.0–10.5)

## 2014-04-07 LAB — BASIC METABOLIC PANEL
Anion gap: 6 (ref 5–15)
BUN: 11 mg/dL (ref 6–23)
CHLORIDE: 108 mmol/L (ref 96–112)
CO2: 27 mmol/L (ref 19–32)
CREATININE: 0.7 mg/dL (ref 0.50–1.10)
Calcium: 8.3 mg/dL — ABNORMAL LOW (ref 8.4–10.5)
GFR calc Af Amer: >90 mL/min (ref >90)
GFR calc non Af Amer: 88 mL/min — ABNORMAL LOW (ref >90)
Glucose, Bld: 83 mg/dL (ref 70–99)
POTASSIUM: 3.4 mmol/L — AB (ref 3.5–5.1)
Sodium: 141 mmol/L (ref 135–145)

## 2014-04-07 LAB — PROTIME-INR
INR: 1.08 (ref 0.00–1.49)
Prothrombin Time: 14.1 seconds (ref 11.6–15.2)

## 2014-04-07 MED ORDER — TECHNETIUM TC 99M SESTAMIBI GENERIC - CARDIOLITE
10.0000 | Freq: Once | INTRAVENOUS | Status: AC | PRN
  Administered 2014-04-07: 10 via INTRAVENOUS

## 2014-04-07 MED ORDER — TECHNETIUM TC 99M SESTAMIBI GENERIC - CARDIOLITE
30.0000 | Freq: Once | INTRAVENOUS | Status: AC | PRN
  Administered 2014-04-07: 30 via INTRAVENOUS

## 2014-04-07 MED ORDER — REGADENOSON 0.4 MG/5ML IV SOLN
0.4000 mg | Freq: Once | INTRAVENOUS | Status: AC
  Administered 2014-04-07: 0.4 mg via INTRAVENOUS
  Filled 2014-04-07: qty 5

## 2014-04-07 MED ORDER — REGADENOSON 0.4 MG/5ML IV SOLN
INTRAVENOUS | Status: AC
  Administered 2014-04-07: 0.4 mg via INTRAVENOUS
  Filled 2014-04-07: qty 5

## 2014-04-07 NOTE — Consult Note (Signed)
Admit date: 04/06/2014 Referring Physician  Dr. Verlon Au Primary Physician  Dr. Mayra Neer Primary Cardiologist  Dr. Daneen Schick Reason for Consultation  Chest pain  HPI: This is a [phone] WF with a history of GERD, migraine HAs, brain aneurysm s/p surgery 2014, anxiety and depression who presented to the ER with complaint of chest pain.  She says the pain started on Thursday evening and was described as a pressure sensation like something sitting on her chest.  This was associated with SOB and nausea.  This occurrred again when getting ready to leave work on Saturday, again only lasting a few minutes. The pain reoccurred on Sunday as well.  She called her PCP on Monday and went in to see the PA.  In the office she had recurrent CP and EKG changes and was transported to [facility] by EMS. Her initial cardiac enzymes were negative.  She says that she has been having problems with nausea when eating recently but has not had any reflux symptoms that she typically gets with her GERD.  She is a nonsmoker but does have a family history of CAD in her Dad.  Cardiology is now asked to consult for further evaluation of CP.       PMH:   Past Medical History  Diagnosis Date  . Hypertension   . Brain aneurysm   . Mitral valve prolapse   . Complication of anesthesia   . PONV (postoperative nausea and vomiting)   . GERD (gastroesophageal reflux disease)   . Headache(784.0)     migraines  . Anxiety   . Depression   . 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     PSH:   Past Surgical History  Procedure Laterality Date  . Tonsillectomy    . Abdominal hysterectomy  1987  . Vascular surgery      stent placed  . Radiology with anesthesia N/A 10/09/2012    Procedure: RADIOLOGY WITH ANESTHESIA;  Surgeon: Rob Hickman, MD;  Location: [place];  Service: Radiology;  Laterality: N/A;  . Ectopic pregnancy surgery    . Aneurysm coiling    . Craniotomy Left 11/15/2012    Procedure: CRANIOTOMY INTRACRANIAL ANEURYSM FOR CAROTID;   Surgeon: Winfield Cunas, MD;  Location: [place] [facility];  Service: Neurosurgery;  Laterality: Left;  LEFT Craniotomy for Aneurysm    Allergies:  Latex; Codeine; and Hydromorphone Prior to Admit Meds:   Prescriptions prior to admission  Medication Sig Dispense Refill Last Dose  . ALPRAZolam (XANAX) 0.5 MG tablet Take 0.5 mg by mouth at bedtime as needed for sleep or anxiety.   04/05/2014 at Unknown time  . amLODipine (NORVASC) 10 MG tablet Take 10 mg by mouth daily.   04/06/2014 at Unknown time  . aspirin 81 MG chewable tablet Chew 81 mg by mouth daily.   04/06/2014 at Unknown time  . Biotin 5000 MCG CAPS Take 3 capsules by mouth daily.   04/05/2014 at Unknown time  . Cholecalciferol (VITAMIN D3 PO) Take 2 tablets by mouth daily.    04/05/2014 at Unknown time  . Cyanocobalamin (VITAMIN B-12 CR) 1000 MCG TBCR Take by mouth.   04/05/2014 at Unknown time  . diphenhydrAMINE (BENADRYL) 25 MG tablet Take 25 mg by mouth daily as needed for allergies.   Past Week at Unknown time  . esomeprazole (NEXIUM) 40 MG capsule Take 40 mg by mouth daily before breakfast.   04/06/2014 at Unknown time  . estradiol (ESTRACE) 1 MG tablet Take 0.5 mg by mouth every other  day.   04/05/2014 at Unknown time  . ezetimibe-simvastatin (VYTORIN) 10-40 MG per tablet Take 1 tablet by mouth at bedtime.    Past Week at Unknown time  . ibuprofen (ADVIL,MOTRIN) 800 MG tablet Take 800 mg by mouth every 8 (eight) hours as needed for headache.   Past Week at Unknown time  . Magnesium 200 MG TABS Take by mouth.   Past Week at Unknown time  . Multiple Vitamin (MULTIVITAMIN WITH MINERALS) TABS tablet Take 2 tablets by mouth daily. Gummy Vitamin   04/05/2014 at Unknown time  . nadolol (CORGARD) 40 MG tablet Take 40 mg by mouth every morning.    04/06/2014 at Unknown time  . venlafaxine (EFFEXOR) 75 MG tablet Take 75 mg by mouth 2 (two) times daily.   04/06/2014 at Unknown time  . amoxicillin-clavulanate (AUGMENTIN) 875-125 MG per tablet Take 1 tablet by mouth 2  (two) times daily. 14 tablet 0   . promethazine (PHENERGAN) 12.5 MG tablet Take 1 tablet (12.5 mg total) by mouth every 6 (six) hours as needed for nausea. 30 tablet 0 Taking   Fam HX:    Family History  Problem Relation Age of Onset  . Dementia Father   . Dementia Brother    Social HX:    History   Social History  . Marital Status: Divorced    Spouse Name: N/A  . Number of Children: N/A  . Years of Education: N/A   Occupational History  . Not on file.   Social History Main Topics  . Smoking status: Never Smoker   . Smokeless tobacco: Not on file  . Alcohol Use: 0.0 oz/week    2-3 Glasses of wine per week  . Drug Use: No  . Sexual Activity: Not Currently   Other Topics Concern  . Not on file   Social History Narrative   Right handed.  Caffeine decaf 1 cup in am.  Divorced.  1 adopted daughter.  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      ROS:  All 11 ROS were addressed and are negative except what is stated in the HPI  Physical Exam: Blood pressure 130/45, pulse 60, temperature 97.5 F (36.4 C), temperature source Oral, resp. rate 18, height 5\' 2"  (1.575 m), weight 107 lb (48.535 kg), SpO2 99 %.    General: Well developed, well nourished, in no acute distress Head: Eyes PERRLA, No xanthomas.   Normal cephalic and atramatic  Lungs:   Clear bilaterally to auscultation and percussion. Heart:   HRRR S1 S2 Pulses are 2+ & equal.            No carotid bruit. No JVD.  No abdominal bruits. No femoral bruits. Abdomen: Bowel sounds are positive, abdomen soft and non-tender without masses Extremities:   No clubbing, cyanosis or edema.  DP +1 Neuro: Alert and oriented X 3. Psych:  Good affect, responds appropriately    Labs:   Lab Results  Component Value Date   WBC 5.9 04/07/2014   HGB 12.6 04/07/2014   HCT 38.1 04/07/2014   MCV 92.9 04/07/2014   PLT 222 04/07/2014    Recent Labs Lab 04/06/14 1857 04/07/14 0425  NA 140 141  K 3.9 3.4*  CL 103 108  CO2 25 27  BUN 12 11    CREATININE 0.67 0.70  CALCIUM 9.6 8.3*  PROT 6.8  --   BILITOT 0.8  --   ALKPHOS 51  --   ALT 21  --   AST 32  --  GLUCOSE 102* 83   No results found for: PTT Lab Results  Component Value Date   INR 1.08 04/06/2014   INR 0.96 10/04/2012   INR 0.99 03/17/2010   Lab Results  Component Value Date   CKTOTAL 52 04/16/2008   CKMB 1.5 04/16/2008   TROPONINI <0.03 04/07/2014     Lab Results  Component Value Date   CHOL 201* 04/07/2014   CHOL  04/16/2008    180        ATP III CLASSIFICATION:  <200     mg/dL   Desirable  200-239  mg/dL   Borderline High  >=240    mg/dL   High          Lab Results  Component Value Date   HDL 61 04/07/2014   HDL 49 04/16/2008   Lab Results  Component Value Date   LDLCALC 107* 04/07/2014   LDLCALC * 04/16/2008    101        Total Cholesterol/HDL:CHD Risk Coronary Heart Disease Risk Table                     Men   Women  1/2 Average Risk   3.4   3.3  Average Risk       5.0   4.4  2 X Average Risk   9.6   7.1  3 X Average Risk  23.4   11.0        Use the calculated Patient Ratio above and the CHD Risk Table to determine the patient's CHD Risk.        ATP III CLASSIFICATION (LDL):  <100     mg/dL   Optimal  100-129  mg/dL   Near or Above                    Optimal  130-159  mg/dL   Borderline  160-189  mg/dL   High  >190     mg/dL   Very High   Lab Results  Component Value Date   TRIG 163* 04/07/2014   TRIG 149 04/16/2008   Lab Results  Component Value Date   CHOLHDL 3.3 04/07/2014   CHOLHDL 3.7 04/16/2008   No results found for: LDLDIRECT    Radiology:  Dg Chest 2 View  04/06/2014   CLINICAL DATA:  Heart palpitations and chest pain since Thursday.  EXAM: CHEST  2 VIEW  COMPARISON:  11/15/2012  FINDINGS: The cardiac silhouette, mediastinal and hilar contours are normal. The lungs are clear. No pleural effusion. The bony thorax is intact.  IMPRESSION: Normal chest x-ray   Electronically Signed   By: Marijo Sanes M.D.    On: 04/06/2014 19:28    EKG:  NSR with diffuse nonspecific ST abnormality in the inferolateral leads - no old EKG to compare  ASSESSMENT/PLAN:  1.  Chest pain with typical and atypical components.  It is a pressure sensation associated with SOB and nausea but she also has been having nausea when eating and has a history of GERD.  EKG is abnormal with diffuse ST abnormality but I do not have an old EKG to compare.  She has a family history of CAD and history of HTN.  Troponins are negative.  Will get a stress myoview today to rule out ischemia. 2.  HTN - controlled 3.  History of cerebral aneurysm s/p repair 4.  GERD 5.  H/O MVP 6.  Anxiety and depression    Sueanne Margarita, MD  04/07/2014  8:44 AM

## 2014-04-07 NOTE — Discharge Summary (Signed)
Physician Discharge Summary  Amber Brooks OIZ:[id number] DOB: 30-Apr-1945 DOA: 04/06/2014  PCP: Mayra Neer, MD  Admit date: 04/06/2014 Discharge date: 04/07/2014  Time spent: 25 minutes  Recommendations for Outpatient Follow-up:  1. Will need further care coordination with gastroenterology--she probably has dysmotility/dysphagia and will need further esophagus dilatation 2. Recommend black cohosh >estrogen? 3. Could consider use of alternative agent other than amlodipine? 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 Consider CBC and basic metabolic panel 1 week   Discharge Diagnoses:  Principal Problem:   Chest pain Active Problems:   Aneurysm, cerebral, nonruptured   Memory loss of unknown cause   Ataxia   Hypertension   Mitral valve prolapse   GERD (gastroesophageal reflux disease)   Anxiety   Depression   Discharge Condition: Good  Diet recommendation: Heart healthy low-salt and specific texture/size of diet until sees GI  Filed Weights   04/06/14 2254  Weight: 48.535 kg (107 lb)    History of present illness:  69 y/o ? h/o cerebral aneurysm S/P surgery MCA 11/2012, migraine, GERD, prior bipolar, esophagus dilatation in the recent past [reports not available] admitted to [facility] 4/4 chest pain rule out secondary to abnormal EKG and chest pain lasting for the past week. Described as a heaviness in the center of chest without radiation, intermittent. Also describes swallowing issues and different textures causing problems. She says for example different pills cause concerns and she has a long time swallowing them On admission her heart score was about 4-5 In either event she underwent stress Myoview which was negative She was discharged home without change in her medications encouraged follow-up with primary care physician and gastroenterologist Thank you for letting me participate in this nice lady's care  Discharge Exam: Filed Vitals:   04/07/14 1447  BP:  115/38  Pulse: 65  Temp: 97.5 F (36.4 C)  Resp: 19   Pleasant in no distress no other issues  General: EOMI NCAT No epigastric tenderness Cardiovascular: S1-S2 no murmur rub or gallop Respiratory: Clinically clear no added sound  Discharge Instructions   Discharge Instructions    Diet - low sodium heart healthy    Complete by:  As directed      Discharge instructions    Complete by:  As directed   Your chest pain was deemed is noncardiac I would recommend follow-up with her regular physician I would also recommend discussion about your estrogen replacement therapy with your primary physician Please do not take any nonsteroidals Please consider follow-up with your gastroenterologist     Increase activity slowly    Complete by:  As directed           Current Discharge Medication List    CONTINUE these medications which have NOT CHANGED   Details  ALPRAZolam (XANAX) 0.5 MG tablet Take 0.5 mg by mouth at bedtime as needed for sleep or anxiety.    amLODipine (NORVASC) 10 MG tablet Take 10 mg by mouth daily.    aspirin 81 MG chewable tablet Chew 81 mg by mouth daily.    Biotin 5000 MCG CAPS Take 3 capsules by mouth daily.    Cholecalciferol (VITAMIN D3 PO) Take 2 tablets by mouth daily.     Cyanocobalamin (VITAMIN B-12 CR) 1000 MCG TBCR Take by mouth.    diphenhydrAMINE (BENADRYL) 25 MG tablet Take 25 mg by mouth daily as needed for allergies.    esomeprazole (NEXIUM) 40 MG capsule Take 40 mg by mouth daily before breakfast.    estradiol (  ESTRACE) 1 MG tablet Take 0.5 mg by mouth every other day.    ezetimibe-simvastatin (VYTORIN) 10-40 MG per tablet Take 1 tablet by mouth at bedtime.     ibuprofen (ADVIL,MOTRIN) 800 MG tablet Take 800 mg by mouth every 8 (eight) hours as needed for headache.    Magnesium 200 MG TABS Take by mouth.    Multiple Vitamin (MULTIVITAMIN WITH MINERALS) TABS tablet Take 2 tablets by mouth daily. Gummy Vitamin    nadolol (CORGARD) 40  MG tablet Take 40 mg by mouth every morning.     venlafaxine (EFFEXOR) 75 MG tablet Take 75 mg by mouth 2 (two) times daily.    promethazine (PHENERGAN) 12.5 MG tablet Take 1 tablet (12.5 mg total) by mouth every 6 (six) hours as needed for nausea. Qty: 30 tablet, Refills: 0      STOP taking these medications     amoxicillin-clavulanate (AUGMENTIN) 875-125 MG per tablet        Allergies  Allergen Reactions  . Latex Anaphylaxis  . Codeine Nausea Only  . Hydromorphone Itching      The results of significant diagnostics from this hospitalization (including imaging, microbiology, ancillary and laboratory) are listed below for reference.    Significant Diagnostic Studies: Dg Chest 2 View  04/06/2014   CLINICAL DATA:  Heart palpitations and chest pain since Thursday.  EXAM: CHEST  2 VIEW  COMPARISON:  11/15/2012  FINDINGS: The cardiac silhouette, mediastinal and hilar contours are normal. The lungs are clear. No pleural effusion. The bony thorax is intact.  IMPRESSION: Normal chest x-ray   Electronically Signed   By: Marijo Sanes M.D.   On: 04/06/2014 19:28   Nm Myocar Multi W/spect W/wall Motion / Ef  04/07/2014   CLINICAL DATA:  Chest pain  EXAM: Lexiscan Myovue  TECHNIQUE: The patient received IV Lexiscan .4mg  over 15 seconds. 33.0 mCi of Technetium 54m Sestamibi injected at 30 seconds. Quantitative SPECT images were obtained in the vertical, horizontal and short axis planes after a 45 minute delay. Rest images were obtained with similar planes and delay using 10.2 mCi of Technetium 36m Sestamibi.  FINDINGS: ECG:  NSR nonspecific T wave changes?  Digoxin effect  Symptoms:  Flushing and chest tightness  RAW Data: Motion  QPS: 7  Quantitative Gated SPECT EF: 76%  Perfusion Images: Normal  IMPRESSION: Normal lexiscan myovue no infarct or ischemia EF 76%  Jenkins Rouge   Electronically Signed   By: Jenkins Rouge M.D.   On: 04/07/2014 14:05    Microbiology: No results found for this or any  previous visit (from the past 240 hour(s)).   Labs: Basic Metabolic Panel:  Recent Labs Lab 04/06/14 1857 04/07/14 0425  NA 140 141  K 3.9 3.4*  CL 103 108  CO2 25 27  GLUCOSE 102* 83  BUN 12 11  CREATININE 0.67 0.70  CALCIUM 9.6 8.3*   Liver Function Tests:  Recent Labs Lab 04/06/14 1857  AST 32  ALT 21  ALKPHOS 51  BILITOT 0.8  PROT 6.8  ALBUMIN 4.2   No results for input(s): LIPASE, AMYLASE in the last 168 hours. No results for input(s): AMMONIA in the last 168 hours. CBC:  Recent Labs Lab 04/06/14 1857 04/07/14 0425  WBC 7.2 5.9  NEUTROABS 4.4  --   HGB 13.7 12.6  HCT 40.7 38.1  MCV 92.5 92.9  PLT 254 222   Cardiac Enzymes:  Recent Labs Lab 04/06/14 1857 04/06/14 2352 04/07/14 0425  TROPONINI <0.03 <0.03 <  0.03   BNP: BNP (last 3 results)  Recent Labs  04/06/14 1857  BNP 41.5    ProBNP (last 3 results) No results for input(s): PROBNP in the last 8760 hours.  CBG:  Recent Labs Lab 04/06/14 2300  GLUCAP 90       Signed:  Nita Brooks  [facility] 04/07/2014, 3:25 PM

## 2014-04-07 NOTE — Progress Notes (Signed)
OT Cancellation Note  Patient Details Name: Amber Brooks MRN: [id number] DOB: [birth year]   Cancelled Treatment:    Reason Eval/Treat Not Completed: OT screened, no needs identified, will sign off. Spoke with pt and she has no OT concerns.   Benito Mccreedy OTR/L [phone] 04/07/2014, 9:57 AM

## 2014-04-07 NOTE — Discharge Instructions (Signed)

## 2014-04-07 NOTE — Progress Notes (Signed)
   Amber Brooks presented for a lexiscan cardiolite today.  No immediate complications.  Stress imaging pending.  Murray Hodgkins, NP 04/07/2014, 12:31 PM

## 2014-04-07 NOTE — Progress Notes (Signed)
PT Cancellation Note  Patient Details Name: Amber Brooks MRN: [id number] DOB: 03-25-1945   Cancelled Treatment:    Reason Eval/Treat Not Completed: PT screened, no needs identified, will sign off   Jahmarion Popoff 04/07/2014, 2:26 PM

## 2014-04-07 NOTE — Progress Notes (Signed)
  Echocardiogram 2D Echocardiogram has been performed.  Amber Brooks 04/07/2014, 2:25 PM

## 2014-04-08 LAB — HEMOGLOBIN A1C
HEMOGLOBIN A1C: 5.4 % (ref 4.8–5.6)
MEAN PLASMA GLUCOSE: 108 mg/dL

## 2014-04-10 ENCOUNTER — Other Ambulatory Visit: Payer: Self-pay | Admitting: Physician Assistant

## 2014-04-10 DIAGNOSIS — R1013 Epigastric pain: Secondary | ICD-10-CM | POA: Diagnosis not present

## 2014-04-10 DIAGNOSIS — R1011 Right upper quadrant pain: Secondary | ICD-10-CM

## 2014-04-10 DIAGNOSIS — R110 Nausea: Secondary | ICD-10-CM | POA: Diagnosis not present

## 2014-04-10 DIAGNOSIS — K219 Gastro-esophageal reflux disease without esophagitis: Secondary | ICD-10-CM | POA: Diagnosis not present

## 2014-04-13 LAB — GLUCOSE, CAPILLARY: GLUCOSE-CAPILLARY: 74 mg/dL (ref 70–99)

## 2014-04-17 ENCOUNTER — Ambulatory Visit
Admission: RE | Admit: 2014-04-17 | Discharge: 2014-04-17 | Disposition: A | Discharge status: Home / Self Care | Payer: Medicare Other | Source: Ambulatory Visit | Attending: Physician Assistant | Admitting: Physician Assistant

## 2014-04-17 DIAGNOSIS — R1011 Right upper quadrant pain: Secondary | ICD-10-CM | POA: Diagnosis not present

## 2014-04-17 DIAGNOSIS — R112 Nausea with vomiting, unspecified: Secondary | ICD-10-CM | POA: Diagnosis not present

## 2014-04-24 DIAGNOSIS — E780 Pure hypercholesterolemia: Secondary | ICD-10-CM | POA: Diagnosis not present

## 2014-04-24 DIAGNOSIS — F322 Major depressive disorder, single episode, severe without psychotic features: Secondary | ICD-10-CM | POA: Diagnosis not present

## 2014-04-24 DIAGNOSIS — R0789 Other chest pain: Secondary | ICD-10-CM | POA: Diagnosis not present

## 2014-04-24 DIAGNOSIS — I519 Heart disease, unspecified: Secondary | ICD-10-CM | POA: Diagnosis not present

## 2014-04-24 DIAGNOSIS — K589 Irritable bowel syndrome without diarrhea: Secondary | ICD-10-CM | POA: Diagnosis not present

## 2014-04-24 DIAGNOSIS — I110 Hypertensive heart disease with heart failure: Secondary | ICD-10-CM | POA: Diagnosis not present

## 2014-04-24 DIAGNOSIS — N959 Unspecified menopausal and perimenopausal disorder: Secondary | ICD-10-CM | POA: Diagnosis not present

## 2014-04-24 DIAGNOSIS — M545 Low back pain: Secondary | ICD-10-CM | POA: Diagnosis not present

## 2014-04-24 DIAGNOSIS — F411 Generalized anxiety disorder: Secondary | ICD-10-CM | POA: Diagnosis not present

## 2014-04-24 DIAGNOSIS — R1310 Dysphagia, unspecified: Secondary | ICD-10-CM | POA: Diagnosis not present

## 2014-04-25 DIAGNOSIS — R112 Nausea with vomiting, unspecified: Secondary | ICD-10-CM | POA: Diagnosis not present

## 2014-04-25 DIAGNOSIS — R1084 Generalized abdominal pain: Secondary | ICD-10-CM | POA: Diagnosis not present

## 2014-04-26 ENCOUNTER — Emergency Department (HOSPITAL_COMMUNITY): Payer: Medicare Other

## 2014-04-26 ENCOUNTER — Observation Stay (HOSPITAL_COMMUNITY)
Admission: EM | Admit: 2014-04-26 | Discharge: 2014-04-28 | Disposition: A | Discharge status: Home / Self Care | Payer: Medicare Other | Attending: Neurological Surgery | Admitting: Neurological Surgery

## 2014-04-26 ENCOUNTER — Encounter (HOSPITAL_COMMUNITY): Payer: Self-pay | Admitting: Family Medicine

## 2014-04-26 DIAGNOSIS — I10 Essential (primary) hypertension: Secondary | ICD-10-CM | POA: Insufficient documentation

## 2014-04-26 DIAGNOSIS — K219 Gastro-esophageal reflux disease without esophagitis: Secondary | ICD-10-CM | POA: Insufficient documentation

## 2014-04-26 DIAGNOSIS — M79604 Pain in right leg: Secondary | ICD-10-CM | POA: Diagnosis present

## 2014-04-26 DIAGNOSIS — M545 Low back pain: Secondary | ICD-10-CM | POA: Diagnosis not present

## 2014-04-26 DIAGNOSIS — M7138 Other bursal cyst, other site: Secondary | ICD-10-CM | POA: Diagnosis not present

## 2014-04-26 DIAGNOSIS — Z90710 Acquired absence of both cervix and uterus: Secondary | ICD-10-CM | POA: Diagnosis not present

## 2014-04-26 DIAGNOSIS — F419 Anxiety disorder, unspecified: Secondary | ICD-10-CM | POA: Insufficient documentation

## 2014-04-26 DIAGNOSIS — Z886 Allergy status to analgesic agent status: Secondary | ICD-10-CM | POA: Diagnosis not present

## 2014-04-26 DIAGNOSIS — Z91040 Latex allergy status: Secondary | ICD-10-CM | POA: Insufficient documentation

## 2014-04-26 DIAGNOSIS — M25551 Pain in right hip: Secondary | ICD-10-CM | POA: Diagnosis not present

## 2014-04-26 DIAGNOSIS — R1031 Right lower quadrant pain: Secondary | ICD-10-CM | POA: Diagnosis not present

## 2014-04-26 DIAGNOSIS — F329 Major depressive disorder, single episode, unspecified: Secondary | ICD-10-CM | POA: Insufficient documentation

## 2014-04-26 DIAGNOSIS — R1110 Vomiting, unspecified: Secondary | ICD-10-CM | POA: Diagnosis not present

## 2014-04-26 DIAGNOSIS — M5416 Radiculopathy, lumbar region: Secondary | ICD-10-CM | POA: Diagnosis present

## 2014-04-26 DIAGNOSIS — Z419 Encounter for procedure for purposes other than remedying health state, unspecified: Secondary | ICD-10-CM

## 2014-04-26 DIAGNOSIS — F1099 Alcohol use, unspecified with unspecified alcohol-induced disorder: Secondary | ICD-10-CM | POA: Diagnosis not present

## 2014-04-26 DIAGNOSIS — M4726 Other spondylosis with radiculopathy, lumbar region: Principal | ICD-10-CM | POA: Insufficient documentation

## 2014-04-26 DIAGNOSIS — R1011 Right upper quadrant pain: Secondary | ICD-10-CM | POA: Diagnosis not present

## 2014-04-26 DIAGNOSIS — M47817 Spondylosis without myelopathy or radiculopathy, lumbosacral region: Secondary | ICD-10-CM | POA: Diagnosis not present

## 2014-04-26 DIAGNOSIS — M5136 Other intervertebral disc degeneration, lumbar region: Secondary | ICD-10-CM | POA: Diagnosis not present

## 2014-04-26 DIAGNOSIS — M47816 Spondylosis without myelopathy or radiculopathy, lumbar region: Secondary | ICD-10-CM | POA: Diagnosis not present

## 2014-04-26 LAB — URINALYSIS, ROUTINE W REFLEX MICROSCOPIC
BILIRUBIN URINE: NEGATIVE
Glucose, UA: NEGATIVE mg/dL
Hgb urine dipstick: NEGATIVE
KETONES UR: NEGATIVE mg/dL
LEUKOCYTES UA: NEGATIVE
NITRITE: NEGATIVE
Protein, ur: NEGATIVE mg/dL
Specific Gravity, Urine: 1.006 (ref 1.005–1.030)
Urobilinogen, UA: 0.2 mg/dL (ref 0.0–1.0)
pH: 7.5 (ref 5.0–8.0)

## 2014-04-26 LAB — CBC WITH DIFFERENTIAL/PLATELET
BASOS ABS: 0 10*3/uL (ref 0.0–0.1)
Basophils Relative: 1 % (ref 0–1)
Eosinophils Absolute: 0.3 10*3/uL (ref 0.0–0.7)
Eosinophils Relative: 4 % (ref 0–5)
HCT: 39.7 % (ref 36.0–46.0)
HEMOGLOBIN: 13.6 g/dL (ref 12.0–15.0)
Lymphocytes Relative: 21 % (ref 12–46)
Lymphs Abs: 1.3 10*3/uL (ref 0.7–4.0)
MCH: 31 pg (ref 26.0–34.0)
MCHC: 34.3 g/dL (ref 30.0–36.0)
MCV: 90.4 fL (ref 78.0–100.0)
MONO ABS: 0.4 10*3/uL (ref 0.1–1.0)
Monocytes Relative: 6 % (ref 3–12)
NEUTROS ABS: 4.2 10*3/uL (ref 1.7–7.7)
Neutrophils Relative %: 68 % (ref 43–77)
Platelets: 266 10*3/uL (ref 150–400)
RBC: 4.39 MIL/uL (ref 3.87–5.11)
RDW: 12.5 % (ref 11.5–15.5)
WBC: 6.1 10*3/uL (ref 4.0–10.5)

## 2014-04-26 LAB — LIPASE, BLOOD: Lipase: 32 U/L (ref 11–59)

## 2014-04-26 LAB — COMPREHENSIVE METABOLIC PANEL
ALT: 36 U/L — AB (ref 0–35)
ANION GAP: 10 (ref 5–15)
AST: 46 U/L — ABNORMAL HIGH (ref 0–37)
Albumin: 3.7 g/dL (ref 3.5–5.2)
Alkaline Phosphatase: 61 U/L (ref 39–117)
BUN: 11 mg/dL (ref 6–23)
CHLORIDE: 104 mmol/L (ref 96–112)
CO2: 22 mmol/L (ref 19–32)
Calcium: 8.9 mg/dL (ref 8.4–10.5)
Creatinine, Ser: 0.78 mg/dL (ref 0.50–1.10)
GFR calc non Af Amer: 85 mL/min — ABNORMAL LOW (ref >90)
GLUCOSE: 88 mg/dL (ref 70–99)
Potassium: 3.9 mmol/L (ref 3.5–5.1)
SODIUM: 136 mmol/L (ref 135–145)
Total Bilirubin: 0.6 mg/dL (ref 0.3–1.2)
Total Protein: 6.2 g/dL (ref 6.0–8.3)

## 2014-04-26 MED ORDER — PROMETHAZINE HCL 25 MG PO TABS: 12.5000 mg | ORAL_TABLET | Freq: Four times a day (QID) | ORAL | Status: DC | PRN

## 2014-04-26 MED ORDER — POLYETHYLENE GLYCOL 3350 17 G PO PACK: 17.0000 g | PACK | Freq: Every day | ORAL | Status: DC | PRN

## 2014-04-26 MED ORDER — AMLODIPINE BESYLATE 10 MG PO TABS
10.0000 mg | ORAL_TABLET | Freq: Every day | ORAL | Status: DC
  Administered 2014-04-26 – 2014-04-28 (×3): 10 mg via ORAL
  Filled 2014-04-26 (×3): qty 1

## 2014-04-26 MED ORDER — MORPHINE SULFATE 4 MG/ML IJ SOLN
6.0000 mg | Freq: Once | INTRAMUSCULAR | Status: AC
  Administered 2014-04-26: 6 mg via INTRAVENOUS
  Filled 2014-04-26: qty 2

## 2014-04-26 MED ORDER — DIPHENHYDRAMINE HCL 25 MG PO TABS
25.0000 mg | ORAL_TABLET | Freq: Every day | ORAL | Status: DC | PRN
  Filled 2014-04-26: qty 1

## 2014-04-26 MED ORDER — PANTOPRAZOLE SODIUM 40 MG PO TBEC
40.0000 mg | DELAYED_RELEASE_TABLET | Freq: Every day | ORAL | Status: DC
  Administered 2014-04-26 – 2014-04-28 (×3): 40 mg via ORAL
  Filled 2014-04-26 (×3): qty 1

## 2014-04-26 MED ORDER — NADOLOL 40 MG PO TABS
40.0000 mg | ORAL_TABLET | Freq: Every morning | ORAL | Status: DC
  Administered 2014-04-27 – 2014-04-28 (×2): 40 mg via ORAL
  Filled 2014-04-26 (×2): qty 1

## 2014-04-26 MED ORDER — BISACODYL 10 MG RE SUPP: 10.0000 mg | Freq: Every day | RECTAL | Status: DC | PRN

## 2014-04-26 MED ORDER — MORPHINE SULFATE 2 MG/ML IJ SOLN
2.0000 mg | INTRAMUSCULAR | Status: DC | PRN
  Administered 2014-04-26 – 2014-04-27 (×3): 2 mg via INTRAVENOUS
  Filled 2014-04-26 (×3): qty 1

## 2014-04-26 MED ORDER — SODIUM CHLORIDE 0.9 % IV SOLN
Freq: Once | INTRAVENOUS | Status: AC
  Administered 2014-04-26: 11:00:00 via INTRAVENOUS

## 2014-04-26 MED ORDER — ONDANSETRON HCL 4 MG/2ML IJ SOLN
4.0000 mg | Freq: Once | INTRAMUSCULAR | Status: AC
  Administered 2014-04-26: 4 mg via INTRAVENOUS
  Filled 2014-04-26: qty 2

## 2014-04-26 MED ORDER — ONDANSETRON HCL 4 MG PO TABS
4.0000 mg | ORAL_TABLET | Freq: Four times a day (QID) | ORAL | Status: DC | PRN
  Administered 2014-04-28: 4 mg via ORAL
  Filled 2014-04-26: qty 1

## 2014-04-26 MED ORDER — ONDANSETRON HCL 4 MG/2ML IJ SOLN
4.0000 mg | Freq: Four times a day (QID) | INTRAMUSCULAR | Status: DC | PRN
  Administered 2014-04-26: 4 mg via INTRAVENOUS
  Filled 2014-04-26: qty 2

## 2014-04-26 MED ORDER — EZETIMIBE-SIMVASTATIN 10-40 MG PO TABS
1.0000 | ORAL_TABLET | Freq: Every day | ORAL | Status: DC
  Administered 2014-04-26 – 2014-04-27 (×2): 1 via ORAL
  Filled 2014-04-26 (×3): qty 1

## 2014-04-26 MED ORDER — IOHEXOL 300 MG/ML  SOLN
25.0000 mL | Freq: Once | INTRAMUSCULAR | Status: AC | PRN
  Administered 2014-04-26: 25 mL via ORAL

## 2014-04-26 MED ORDER — VENLAFAXINE HCL 37.5 MG PO TABS
75.0000 mg | ORAL_TABLET | Freq: Two times a day (BID) | ORAL | Status: DC
  Administered 2014-04-26 – 2014-04-28 (×4): 75 mg via ORAL
  Filled 2014-04-26 (×5): qty 2

## 2014-04-26 MED ORDER — HYOSCYAMINE SULFATE 0.125 MG PO TABS
0.1250 mg | ORAL_TABLET | ORAL | Status: DC | PRN
  Filled 2014-04-26: qty 1

## 2014-04-26 MED ORDER — SODIUM CHLORIDE 0.9 % IV SOLN
INTRAVENOUS | Status: DC
  Administered 2014-04-27: 06:00:00 via INTRAVENOUS

## 2014-04-26 MED ORDER — DOCUSATE SODIUM 100 MG PO CAPS
100.0000 mg | ORAL_CAPSULE | Freq: Two times a day (BID) | ORAL | Status: DC
  Administered 2014-04-26: 100 mg via ORAL
  Filled 2014-04-26: qty 1

## 2014-04-26 MED ORDER — SENNA 8.6 MG PO TABS
1.0000 | ORAL_TABLET | Freq: Two times a day (BID) | ORAL | Status: DC
  Administered 2014-04-26: 8.6 mg via ORAL
  Filled 2014-04-26: qty 1

## 2014-04-26 MED ORDER — FLEET ENEMA 7-19 GM/118ML RE ENEM: 1.0000 | ENEMA | Freq: Once | RECTAL | Status: AC | PRN

## 2014-04-26 MED ORDER — ALPRAZOLAM 0.5 MG PO TABS
0.5000 mg | ORAL_TABLET | Freq: Every evening | ORAL | Status: DC | PRN
  Administered 2014-04-27: 0.5 mg via ORAL
  Filled 2014-04-26: qty 1

## 2014-04-26 MED ORDER — IOHEXOL 300 MG/ML  SOLN
100.0000 mL | Freq: Once | INTRAMUSCULAR | Status: AC | PRN
Start: 2014-04-26 — End: 2014-04-26
  Administered 2014-04-26: 100 mL via INTRAVENOUS

## 2014-04-26 MED ORDER — DEXAMETHASONE SODIUM PHOSPHATE 10 MG/ML IJ SOLN
10.0000 mg | Freq: Once | INTRAMUSCULAR | Status: AC
  Administered 2014-04-26: 10 mg via INTRAVENOUS
  Filled 2014-04-26: qty 1

## 2014-04-26 MED ORDER — KETOROLAC TROMETHAMINE 15 MG/ML IJ SOLN
15.0000 mg | Freq: Four times a day (QID) | INTRAMUSCULAR | Status: DC
  Administered 2014-04-27 (×2): 15 mg via INTRAVENOUS
  Filled 2014-04-26 (×2): qty 1

## 2014-04-26 MED ORDER — ALUM & MAG HYDROXIDE-SIMETH 200-200-20 MG/5ML PO SUSP: 30.0000 mL | Freq: Four times a day (QID) | ORAL | Status: DC | PRN

## 2014-04-26 MED ORDER — FENTANYL CITRATE (PF) 100 MCG/2ML IJ SOLN
50.0000 ug | Freq: Once | INTRAMUSCULAR | Status: AC
  Administered 2014-04-26: 50 ug via INTRAVENOUS
  Filled 2014-04-26: qty 2

## 2014-04-26 MED ORDER — ESTRADIOL 1 MG PO TABS
0.5000 mg | ORAL_TABLET | ORAL | Status: DC
  Administered 2014-04-28: 0.5 mg via ORAL
  Filled 2014-04-26 (×2): qty 0.5

## 2014-04-26 NOTE — ED Provider Notes (Signed)
3:26 PM Patient signed out to me by Marc Morgans, PA-C.  Plan:  Follow-up on MRIs. If negative, DC to home with prednisone and pain meds.  MRI remarkable for large cyst affecting L5 nerve root. Discussed with Dr. Ellene Route, who will admit the patient.  Results for orders placed or performed during the hospital encounter of 04/26/14  CBC with Differential  Result Value Ref Range   WBC 6.1 4.0 - 10.5 K/uL   RBC 4.39 3.87 - 5.11 MIL/uL   Hemoglobin 13.6 12.0 - 15.0 g/dL   HCT 39.7 36.0 - 46.0 %   MCV 90.4 78.0 - 100.0 fL   MCH 31.0 26.0 - 34.0 pg   MCHC 34.3 30.0 - 36.0 g/dL   RDW 12.5 11.5 - 15.5 %   Platelets 266 150 - 400 K/uL   Neutrophils Relative % 68 43 - 77 %   Neutro Abs 4.2 1.7 - 7.7 K/uL   Lymphocytes Relative 21 12 - 46 %   Lymphs Abs 1.3 0.7 - 4.0 K/uL   Monocytes Relative 6 3 - 12 %   Monocytes Absolute 0.4 0.1 - 1.0 K/uL   Eosinophils Relative 4 0 - 5 %   Eosinophils Absolute 0.3 0.0 - 0.7 K/uL   Basophils Relative 1 0 - 1 %   Basophils Absolute 0.0 0.0 - 0.1 K/uL  Comprehensive metabolic panel  Result Value Ref Range   Sodium 136 135 - 145 mmol/L   Potassium 3.9 3.5 - 5.1 mmol/L   Chloride 104 96 - 112 mmol/L   CO2 22 19 - 32 mmol/L   Glucose, Bld 88 70 - 99 mg/dL   BUN 11 6 - 23 mg/dL   Creatinine, Ser 0.78 0.50 - 1.10 mg/dL   Calcium 8.9 8.4 - 10.5 mg/dL   Total Protein 6.2 6.0 - 8.3 g/dL   Albumin 3.7 3.5 - 5.2 g/dL   AST 46 (H) 0 - 37 U/L   ALT 36 (H) 0 - 35 U/L   Alkaline Phosphatase 61 39 - 117 U/L   Total Bilirubin 0.6 0.3 - 1.2 mg/dL   GFR calc non Af Amer 85 (L) >90 mL/min   GFR calc Af Amer >90 >90 mL/min   Anion gap 10 5 - 15  Lipase, blood  Result Value Ref Range   Lipase 32 11 - 59 U/L  Urinalysis, Routine w reflex microscopic  Result Value Ref Range   Color, Urine YELLOW YELLOW   APPearance CLEAR CLEAR   Specific Gravity, Urine 1.006 1.005 - 1.030   pH 7.5 5.0 - 8.0   Glucose, UA NEGATIVE NEGATIVE mg/dL   Hgb urine dipstick NEGATIVE  NEGATIVE   Bilirubin Urine NEGATIVE NEGATIVE   Ketones, ur NEGATIVE NEGATIVE mg/dL   Protein, ur NEGATIVE NEGATIVE mg/dL   Urobilinogen, UA 0.2 0.0 - 1.0 mg/dL   Nitrite NEGATIVE NEGATIVE   Leukocytes, UA NEGATIVE NEGATIVE   Dg Chest 2 View  04/06/2014   CLINICAL DATA:  Heart palpitations and chest pain since Thursday.  EXAM: CHEST  2 VIEW  COMPARISON:  11/15/2012  FINDINGS: The cardiac silhouette, mediastinal and hilar contours are normal. The lungs are clear. No pleural effusion. The bony thorax is intact.  IMPRESSION: Normal chest x-ray   Electronically Signed   By: Marijo Sanes M.D.   On: 04/06/2014 19:28   Mr Lumbar Spine Wo Contrast  04/26/2014   CLINICAL DATA:  Right lower quadrant flank and low back pain. Right hip pain.  EXAM: MRI LUMBAR SPINE WITHOUT  CONTRAST  TECHNIQUE: Multiplanar, multisequence MR imaging of the lumbar spine was performed. No intravenous contrast was administered.  COMPARISON:  CT of the abdomen and pelvis 06/21/2005  FINDINGS: Normal signal is present in the conus medullaris which terminates at L1. Marrow signal and vertebral body heights are normal. There slight degenerative anterolisthesis at L4-5. Alignment is otherwise anatomic. A hemangioma is noted on the left at L4.  Limited imaging of the abdomen is within normal limits.  The disc levels at L2-3 and above are normal.  L3-4: A leftward disc protrusion is present. Mild facet hypertrophy is present bilaterally. Mild left subarticular narrowing is evident.  L4-5: A large right right synovial cyst emanates from a moderate facet hypertrophy a on the right. This results in severe right subarticular narrowing and displacement of the thecal sac to the right. Mild left subarticular narrowing is present. Mild left foraminal narrowing is present. The right foramen is patent.  L5-S1: Negative.  IMPRESSION: 1. 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 This results in severe right and  mild left subarticular stenosis, likely affecting the L5 nerve roots. 2. Mild foraminal narrowing on the left at L4-5 potentially affects the left L4 nerve roots. 3. Mild left subarticular narrowing at L3-4.   Electronically Signed   By: San Morelle M.D.   On: 04/26/2014 16:28   Mr Pelvis Wo Contrast  04/26/2014   CLINICAL DATA:  Right lower quadrant pain, flank pain, back pain  EXAM: MRI PELVIS WITHOUT CONTRAST  TECHNIQUE: Multiplanar multisequence MR imaging of the pelvis was performed. No intravenous contrast was administered.  COMPARISON:  None.  FINDINGS: Bone  Marrow: No marrow signal abnormality. No fracture, dislocation or avascular necrosis. Normal sacrum and sacroiliac joints.  Alignment  Normal. No subluxation.  Dysplasia  None.  Joint effusion  None.  Labrum  Normal. No labral tear.  Cartilage  Femoral cartilage: Normal.  Acetabular cartilage: Normal.  Capsule and ligaments  Normal.  Muscles and Tendons  Flexors: Normal.  Extensors: Normal.  Abductors: Normal.  Adductors: Normal.  Rotators: Normal.  Hamstrings: Normal.  Other Findings  Partially visualized is a right L4-5 facet intraspinal synovial cyst with mass effect on the right L5 nerve root. Moderate bilateral L4-5 facet arthropathy. This is better evaluated on MRI lumbar spine performed same day.  Viscera  Normal. No abnormality seen in pelvis. No lymphadenopathy. No free fluid in the pelvis.  IMPRESSION: 1. No hip fracture, dislocation or avascular necrosis. 2. Partially visualized is a right L4-5 facet intraspinal synovial cyst with mass effect on the right L5 nerve root. This is better evaluated on MRI lumbar spine performed same day.   Electronically Signed   By: Kathreen Devoid   On: 04/26/2014 16:36   Ct Abdomen Pelvis W Contrast  04/26/2014   CLINICAL DATA:  Abdominal pain. Flank pain. RIGHT lower quadrant pain and lumbago/low back pain since yesterday. Vomiting.  EXAM: CT ABDOMEN AND PELVIS WITH CONTRAST  TECHNIQUE:  Multidetector CT imaging of the abdomen and pelvis was performed using the standard protocol following bolus administration of intravenous contrast.  CONTRAST:  130mL OMNIPAQUE IOHEXOL 300 MG/ML  SOLN  COMPARISON:  None.  FINDINGS: Musculoskeletal: Mild RIGHT hip osteoarthritis. No aggressive osseous lesions. Prominent Schmorl's node in the T12 superior endplate.  Lung Bases: Clear.  Liver:  Normal.  Spleen:  Normal.  Gallbladder:  Normal, normal.  Common bile duct:  Within normal limits for age.  Pancreas:  Normal.  Adrenal  glands:  Normal.  Kidneys: Normal enhancement. Delayed excretion of contrast appears normal. LEFT ureter normal. RIGHT ureter normal.  Stomach:  Within normal limits.  Small bowel:  Normal.  Colon: Appendix not identified. No RIGHT lower quadrant inflammatory changes. Fluid levels are present in the proximal colon. Fluid levels extend through the transverse colon. The descending colon is collapsed. Scattered areas of diverticulosis. No obstruction.  Pelvic Genitourinary: Hysterectomy. Urinary bladder appears normal.  Peritoneum: No free fluid or free air.  Vasculature: Atherosclerosis.  Body Wall: Tiny fat containing periumbilical hernia.  IMPRESSION: 1. Abnormal but nonspecific air-fluid levels in the colon. These are most commonly associated with enteric infection. 2. Hysterectomy.   Electronically Signed   By: Dereck Ligas M.D.   On: 04/26/2014 14:13   Nm Myocar Multi W/spect W/wall Motion / Ef  04/07/2014   CLINICAL DATA:  Chest pain  EXAM: Lexiscan Myovue  TECHNIQUE: The patient received IV Lexiscan .4mg  over 15 seconds. 33.0 mCi of Technetium 26m Sestamibi injected at 30 seconds. Quantitative SPECT images were obtained in the vertical, horizontal and short axis planes after a 45 minute delay. Rest images were obtained with similar planes and delay using 10.2 mCi of Technetium 60m Sestamibi.  FINDINGS: ECG:  NSR nonspecific T wave changes?  Digoxin effect  Symptoms:  Flushing and chest  tightness  RAW Data: Motion  QPS: 7  Quantitative Gated SPECT EF: 76%  Perfusion Images: Normal  IMPRESSION: Normal lexiscan myovue no infarct or ischemia EF 76%  Jenkins Rouge   Electronically Signed   By: Jenkins Rouge M.D.   On: 04/07/2014 14:05   US Abdomen Limited Ruq  04/17/2014   CLINICAL DATA:  Several week history of right upper quadrant abdominal pain with nausea and vomiting.  EXAM: US ABDOMEN LIMITED - RIGHT UPPER QUADRANT  COMPARISON:  None.  FINDINGS: Gallbladder:  No gallstones or wall thickening visualized. No sonographic Murphy sign noted.  Common bile duct:  Diameter: 3.8 mm  Liver:  Normal echogenicity without focal lesion or biliary dilatation.  IMPRESSION: Normal right upper quadrant ultrasound examination.   Electronically Signed   By: Marijo Sanes M.D.   On: 04/17/2014 13:48      Montine Circle, PA-C 35/67/01 [id number]  Delora Fuel, MD 02/01/41 [id number]

## 2014-04-26 NOTE — ED Notes (Signed)
Pt here for RLQ, flank and lower back pain. sts some vomiting yesterday. sts she took a laxative yesterday.

## 2014-04-26 NOTE — ED Provider Notes (Signed)
CSN: [id number]     Arrival date & time 04/26/14  53 History   First MD Initiated Contact with Patient 04/26/14 1050     Chief Complaint  Patient presents with  . Abdominal Pain  . Flank Pain     (Consider location/radiation/quality/duration/timing/severity/associated sxs/prior Treatment) HPI [name] is a 69 y.o. female with hx of htn, brain aneurysm, GERD, anxiety, depression, presents to ED with complaint of abdominal pain. Pt states symptoms started about a week ago. Initially symptoms came and went. Denies any associated symptoms. Was seen by her PCP, had [place] RUQ done which was normal. She spoke with her gastroenterologist who told her if her symptoms get worse to come to ED, she did however make an apt with him which is not for another week. Pt states symptoms got worse since yesterday. States pain in the right lower abdomen, radiates to the back. Denies urinary symptoms. Admits to nausea and vomiting yesterday, none today. States she took mag citrate yesterday thinking she may have been constipated and had a large BM, but states her pain did not improve. States "this is the worst pain I have ever had." hx of hysterectomy, Amber other abdominal surgeries. Amber fever, chills.   Past Medical History  Diagnosis Date  . Hypertension   . Brain aneurysm   . Mitral valve prolapse   . Complication of anesthesia   . PONV (postoperative nausea and vomiting)   . GERD (gastroesophageal reflux disease)   . Headache(784.0)     migraines  . Anxiety   . Depression   . 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 Tonsillectomy    . Abdominal hysterectomy  1987  . Vascular surgery      stent placed  . Radiology with anesthesia N/A 10/09/2012    Procedure: RADIOLOGY WITH ANESTHESIA;  Surgeon: Rob Hickman, MD;  Location: [facility];  Service: Radiology;  Laterality: N/A;  . Ectopic pregnancy surgery    . Aneurysm coiling    . Craniotomy Left 11/15/2012     Procedure: CRANIOTOMY INTRACRANIAL ANEURYSM FOR CAROTID;  Surgeon: Winfield Cunas, MD;  Location: [facility];  Service: Neurosurgery;  Laterality: Left;  LEFT Craniotomy for Aneurysm   Family History  Problem Relation Age of Onset  . Dementia Father   . Dementia Brother    History  Substance Use Topics  . Smoking status: Never Smoker   . Smokeless tobacco: Not on file  . Alcohol Use: 0.0 oz/week    2-3 Glasses of wine per week   OB History    Amber data available     Review of Systems  Constitutional: Negative for fever and chills.  Respiratory: Negative for cough, chest tightness and shortness of breath.   Cardiovascular: Negative for chest pain, palpitations and leg swelling.  Gastrointestinal: Positive for nausea, vomiting and abdominal pain. Negative for diarrhea.  Genitourinary: Positive for flank pain. Negative for dysuria, frequency and difficulty urinating.  Musculoskeletal: Negative for myalgias, arthralgias, neck pain and neck stiffness.  Skin: Negative for rash.  Neurological: Negative for dizziness, weakness and headaches.  All other systems reviewed and are negative.     Allergies  Latex; Codeine; and Hydromorphone  Home Medications   Prior to Admission medications   Medication Sig Start Date End Date Taking? Authorizing Provider  ALPRAZolam Duanne Moron) 0.5 MG tablet Take 0.5 mg by mouth at bedtime as needed for sleep or anxiety.    Historical Provider, MD  amLODipine (  NORVASC) 10 MG tablet Take 10 mg by mouth daily.    Historical Provider, MD  aspirin 81 MG chewable tablet Chew 81 mg by mouth daily.    Historical Provider, MD  Biotin 5000 MCG CAPS Take 3 capsules by mouth daily.    Historical Provider, MD  Cholecalciferol (VITAMIN D3 PO) Take 2 tablets by mouth daily.     Historical Provider, MD  Cyanocobalamin (VITAMIN B-12 CR) 1000 MCG TBCR Take by mouth.    Historical Provider, MD  diphenhydrAMINE (BENADRYL) 25 MG tablet Take 25 mg by mouth daily as needed for  allergies.    Historical Provider, MD  esomeprazole (NEXIUM) 40 MG capsule Take 40 mg by mouth daily before breakfast.    Historical Provider, MD  estradiol (ESTRACE) 1 MG tablet Take 0.5 mg by mouth every other day.    Historical Provider, MD  ezetimibe-simvastatin (VYTORIN) 10-40 MG per tablet Take 1 tablet by mouth at bedtime.     Historical Provider, MD  ibuprofen (ADVIL,MOTRIN) 800 MG tablet Take 800 mg by mouth every 8 (eight) hours as needed for headache.    Historical Provider, MD  Magnesium 200 MG TABS Take by mouth.    Historical Provider, MD  Multiple Vitamin (MULTIVITAMIN WITH MINERALS) TABS tablet Take 2 tablets by mouth daily. Gummy Vitamin    Historical Provider, MD  nadolol (CORGARD) 40 MG tablet Take 40 mg by mouth every morning.     Historical Provider, MD  promethazine (PHENERGAN) 12.5 MG tablet Take 1 tablet (12.5 mg total) by mouth every 6 (six) hours as needed for nausea. 10/10/12   Hedy Jacob, PA-C  venlafaxine (EFFEXOR) 75 MG tablet Take 75 mg by mouth 2 (two) times daily.    Historical Provider, MD   BP 150/83 mmHg  Pulse 67  Temp(Src) 97.9 F (36.6 C)  Resp 20  SpO2 100% Physical Exam  Constitutional: She is oriented to person, place, and time. She appears well-developed and well-nourished.  Appears to be in a lot of pain  HENT:  Head: Normocephalic.  Eyes: Conjunctivae are normal.  Neck: Neck supple.  Cardiovascular: Normal rate, regular rhythm and normal heart sounds.   Pulmonary/Chest: Effort normal and breath sounds normal. Amber respiratory distress. She has Amber wheezes. She has Amber rales.  Abdominal: Soft. Bowel sounds are normal. She exhibits Amber distension. There is tenderness. There is Amber rebound and Amber guarding.  RLQ and RUQ tenderness. Amber CVA tenderness.   Musculoskeletal: She exhibits Amber edema.  Tender to palpation over right SI joint going into the right buttock. Amber pain with straight right leg raise  Neurological: She is alert and oriented to  person, place, and time.  Skin: Skin is warm and dry.  Psychiatric: She has a normal mood and affect. Her behavior is normal.  Nursing note and vitals reviewed.   ED Course  Procedures (including critical care time) Labs Review Labs Reviewed  COMPREHENSIVE METABOLIC PANEL - Abnormal; Notable for the following:    AST 46 (*)    ALT 36 (*)    GFR calc non Af Amer 85 (*)    All other components within normal limits  CBC WITH DIFFERENTIAL/PLATELET  LIPASE, BLOOD  URINALYSIS, ROUTINE W REFLEX MICROSCOPIC    Imaging Review Ct Abdomen Pelvis W Contrast  04/26/2014   CLINICAL DATA:  Abdominal pain. Flank pain. RIGHT lower quadrant pain and lumbago/low back pain since yesterday. Vomiting.  EXAM: CT ABDOMEN AND PELVIS WITH CONTRAST  TECHNIQUE: Multidetector CT imaging of  the abdomen and pelvis was performed using the standard protocol following bolus administration of intravenous contrast.  CONTRAST:  163mL OMNIPAQUE IOHEXOL 300 MG/ML  SOLN  COMPARISON:  None.  FINDINGS: Musculoskeletal: Mild RIGHT hip osteoarthritis. Amber aggressive osseous lesions. Prominent Schmorl's node in the T12 superior endplate.  Lung Bases: Clear.  Liver:  Normal.  Spleen:  Normal.  Gallbladder:  Normal, normal.  Common bile duct:  Within normal limits for age.  Pancreas:  Normal.  Adrenal glands:  Normal.  Kidneys: Normal enhancement. Delayed excretion of contrast appears normal. LEFT ureter normal. RIGHT ureter normal.  Stomach:  Within normal limits.  Small bowel:  Normal.  Colon: Appendix not identified. Amber RIGHT lower quadrant inflammatory changes. Fluid levels are present in the proximal colon. Fluid levels extend through the transverse colon. The descending colon is collapsed. Scattered areas of diverticulosis. Amber obstruction.  Pelvic Genitourinary: Hysterectomy. Urinary bladder appears normal.  Peritoneum: Amber free fluid or free air.  Vasculature: Atherosclerosis.  Body Wall: Tiny fat containing periumbilical hernia.   IMPRESSION: 1. Abnormal but nonspecific air-fluid levels in the colon. These are most commonly associated with enteric infection. 2. Hysterectomy.   Electronically Signed   By: Dereck Ligas M.D.   On: 04/26/2014 14:13     EKG Interpretation None      MDM   Final diagnoses:  Right hip pain  Right hip pain    Patient with right abdominal pain, worsening over the week. She appears to be in a lot of pain, she is tearful. She states this is the worst pain she has ever had. Will give fentanyl for pain, labs ordered, will get CT abdomen and pelvis.   3:53 PM CT abdomen and pelvis is negative. Labs with Amber elevation of white count. Patient continues to have a lot of pain but feels much better, reassessed and repeated exam. Patient now have exam findings of some lumbar radiculopathy with pain going down right buttock right posterior thigh. Patient also still having significant tenderness in the right lower quadrant. This is concerning for psoas muscle injury or pathology. She is still having severe pain when applying weight to the right leg, unable to walk. More pain medications ordered. Discussed with Dr. Vallery Ridge, who evaluated patient as well. Plan to get MRI of the lumbar spine and pelvis. I discussed with radiologist, advised Amber contrast needed.  If MR is negative, patient is okay to be discharged home with steroids and pain medications. Decadron IM in emergency department prior to discharge. Patient signed out at shift change to [facility] Vitals:   04/26/14 1330 04/26/14 1400 04/26/14 1430 04/26/14 1445  BP: 98/67 133/57 144/60 131/94  Pulse: 70 73 77 58  Temp:      Resp:    16  SpO2: 99% 94% 93% 100%      Jeannett Senior, PA-C 04/26/14 [place], MD 05/02/14 [phone]

## 2014-04-26 NOTE — ED Notes (Signed)
Re-paged [phone]

## 2014-04-26 NOTE — ED Notes (Signed)
Patient transported to MRI 

## 2014-04-26 NOTE — H&P (Signed)
Amber Brooks is an 69 y.o. female.   Chief Complaint: Severe right lower extremity pain HPI: Patient is a 69 year old right-handed individual who tells me that about 8 or 9 days ago she started developing pain in the buttock and right lower extremity is also hurt in the region of her flank. This is become progressively worse to the point that she cannot walk or bear weight on her lower extremities. She came to the emergency department and a CT scan of the abdomen was performed which was nonrevealing an MRI was then ordered and images of lumbar spine that are seen on this MR revealed the presence of a large synovial cyst at L4-L5 on the right side. On examination she appears to have some weakness in tibialis anterior group. I discussed some options for the patient's care and she is now being admitted to undergo surgical extirpation of the cyst to decompress the L5 nerve root on the right side.  Past Medical History  Diagnosis Date  . Hypertension   . Brain aneurysm   . Mitral valve prolapse   . Complication of anesthesia   . PONV (postoperative nausea and vomiting)   . GERD (gastroesophageal reflux disease)   . Headache(784.0)     migraines  . Anxiety   . Depression   . 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    Past Surgical History  Procedure Laterality Date  . Tonsillectomy    . Abdominal hysterectomy  1987  . Vascular surgery      stent placed  . Radiology with anesthesia N/A 10/09/2012    Procedure: RADIOLOGY WITH ANESTHESIA;  Surgeon: Rob Hickman, MD;  Location: [facility];  Service: Radiology;  Laterality: N/A;  . Ectopic pregnancy surgery    . Aneurysm coiling    . Craniotomy Left 11/15/2012    Procedure: CRANIOTOMY INTRACRANIAL ANEURYSM FOR CAROTID;  Surgeon: Winfield Cunas, MD;  Location: [facility];  Service: Neurosurgery;  Laterality: Left;  LEFT Craniotomy for Aneurysm    Family History  Problem Relation Age of Onset  . Dementia Father   . Dementia Brother    Social History:   reports that she has never smoked. She does not have any smokeless tobacco history on file. She reports that she drinks alcohol. She reports that she does not use illicit drugs.  Allergies:  Allergies  Allergen Reactions  . Latex Anaphylaxis  . Codeine Nausea Only  . Hydromorphone Itching     (Not in a hospital admission)  Results for orders placed or performed during the hospital encounter of 04/26/14 (from the past 48 hour(s))  CBC with Differential     Status: None   Collection Time: 04/26/14 10:55 AM  Result Value Ref Range   WBC 6.1 4.0 - 10.5 K/uL   RBC 4.39 3.87 - 5.11 MIL/uL   Hemoglobin 13.6 12.0 - 15.0 g/dL   HCT 39.7 36.0 - 46.0 %   MCV 90.4 78.0 - 100.0 fL   MCH 31.0 26.0 - 34.0 pg   MCHC 34.3 30.0 - 36.0 g/dL   RDW 12.5 11.5 - 15.5 %   Platelets 266 150 - 400 K/uL   Neutrophils Relative % 68 43 - 77 %   Neutro Abs 4.2 1.7 - 7.7 K/uL   Lymphocytes Relative 21 12 - 46 %   Lymphs Abs 1.3 0.7 - 4.0 K/uL   Monocytes Relative 6 3 - 12 %   Monocytes Absolute 0.4 0.1 - 1.0 K/uL   Eosinophils Relative 4 0 - 5 %  Eosinophils Absolute 0.3 0.0 - 0.7 K/uL   Basophils Relative 1 0 - 1 %   Basophils Absolute 0.0 0.0 - 0.1 K/uL  Comprehensive metabolic panel     Status: Abnormal   Collection Time: 04/26/14 10:55 AM  Result Value Ref Range   Sodium 136 135 - 145 mmol/L   Potassium 3.9 3.5 - 5.1 mmol/L   Chloride 104 96 - 112 mmol/L   CO2 22 19 - 32 mmol/L   Glucose, Bld 88 70 - 99 mg/dL   BUN 11 6 - 23 mg/dL   Creatinine, Ser 0.78 0.50 - 1.10 mg/dL   Calcium 8.9 8.4 - 10.5 mg/dL   Total Protein 6.2 6.0 - 8.3 g/dL   Albumin 3.7 3.5 - 5.2 g/dL   AST 46 (H) 0 - 37 U/L   ALT 36 (H) 0 - 35 U/L   Alkaline Phosphatase 61 39 - 117 U/L   Total Bilirubin 0.6 0.3 - 1.2 mg/dL   GFR calc non Af Amer 85 (L) >90 mL/min   GFR calc Af Amer >90 >90 mL/min    Comment: (NOTE) The eGFR has been calculated using the CKD EPI equation. This calculation has not been validated in all  clinical situations. eGFR's persistently <90 mL/min signify possible Chronic Kidney Disease.    Anion gap 10 5 - 15  Lipase, blood     Status: None   Collection Time: 04/26/14 10:55 AM  Result Value Ref Range   Lipase 32 11 - 59 U/L  Urinalysis, Routine w reflex microscopic     Status: None   Collection Time: 04/26/14 12:00 PM  Result Value Ref Range   Color, Urine YELLOW YELLOW   APPearance CLEAR CLEAR   Specific Gravity, Urine 1.006 1.005 - 1.030   pH 7.5 5.0 - 8.0   Glucose, UA NEGATIVE NEGATIVE mg/dL   Hgb urine dipstick NEGATIVE NEGATIVE   Bilirubin Urine NEGATIVE NEGATIVE   Ketones, ur NEGATIVE NEGATIVE mg/dL   Protein, ur NEGATIVE NEGATIVE mg/dL   Urobilinogen, UA 0.2 0.0 - 1.0 mg/dL   Nitrite NEGATIVE NEGATIVE   Leukocytes, UA NEGATIVE NEGATIVE    Comment: MICROSCOPIC NOT DONE ON URINES WITH NEGATIVE PROTEIN, BLOOD, LEUKOCYTES, NITRITE, OR GLUCOSE <1000 mg/dL.   Mr Lumbar Spine Wo Contrast  04/26/2014   CLINICAL DATA:  Right lower quadrant flank and low back pain. Right hip pain.  EXAM: MRI LUMBAR SPINE WITHOUT CONTRAST  TECHNIQUE: Multiplanar, multisequence MR imaging of the lumbar spine was performed. No intravenous contrast was administered.  COMPARISON:  CT of the abdomen and pelvis 06/21/2005  FINDINGS: Normal signal is present in the conus medullaris which terminates at L1. Marrow signal and vertebral body heights are normal. There slight degenerative anterolisthesis at L4-5. Alignment is otherwise anatomic. A hemangioma is noted on the left at L4.  Limited imaging of the abdomen is within normal limits.  The disc levels at L2-3 and above are normal.  L3-4: A leftward disc protrusion is present. Mild facet hypertrophy is present bilaterally. Mild left subarticular narrowing is evident.  L4-5: A large right right synovial cyst emanates from a moderate facet hypertrophy a on the right. This results in severe right subarticular narrowing and displacement of the thecal sac to the  right. Mild left subarticular narrowing is present. Mild left foraminal narrowing is present. The right foramen is patent.  L5-S1: Negative.  IMPRESSION: 1. 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 This results in severe right and  mild left subarticular stenosis, likely affecting the L5 nerve roots. 2. Mild foraminal narrowing on the left at L4-5 potentially affects the left L4 nerve roots. 3. Mild left subarticular narrowing at L3-4.   Electronically Signed   By: San Morelle M.D.   On: 04/26/2014 16:28   Mr Pelvis Wo Contrast  04/26/2014   CLINICAL DATA:  Right lower quadrant pain, flank pain, back pain  EXAM: MRI PELVIS WITHOUT CONTRAST  TECHNIQUE: Multiplanar multisequence MR imaging of the pelvis was performed. No intravenous contrast was administered.  COMPARISON:  None.  FINDINGS: Bone  Marrow: No marrow signal abnormality. No fracture, dislocation or avascular necrosis. Normal sacrum and sacroiliac joints.  Alignment  Normal. No subluxation.  Dysplasia  None.  Joint effusion  None.  Labrum  Normal. No labral tear.  Cartilage  Femoral cartilage: Normal.  Acetabular cartilage: Normal.  Capsule and ligaments  Normal.  Muscles and Tendons  Flexors: Normal.  Extensors: Normal.  Abductors: Normal.  Adductors: Normal.  Rotators: Normal.  Hamstrings: Normal.  Other Findings  Partially visualized is a right L4-5 facet intraspinal synovial cyst with mass effect on the right L5 nerve root. Moderate bilateral L4-5 facet arthropathy. This is better evaluated on MRI lumbar spine performed same day.  Viscera  Normal. No abnormality seen in pelvis. No lymphadenopathy. No free fluid in the pelvis.  IMPRESSION: 1. No hip fracture, dislocation or avascular necrosis. 2. Partially visualized is a right L4-5 facet intraspinal synovial cyst with mass effect on the right L5 nerve root. This is better evaluated on MRI lumbar spine performed same day.   Electronically Signed    By: Kathreen Devoid   On: 04/26/2014 16:36   Ct Abdomen Pelvis W Contrast  04/26/2014   CLINICAL DATA:  Abdominal pain. Flank pain. RIGHT lower quadrant pain and lumbago/low back pain since yesterday. Vomiting.  EXAM: CT ABDOMEN AND PELVIS WITH CONTRAST  TECHNIQUE: Multidetector CT imaging of the abdomen and pelvis was performed using the standard protocol following bolus administration of intravenous contrast.  CONTRAST:  136m OMNIPAQUE IOHEXOL 300 MG/ML  SOLN  COMPARISON:  None.  FINDINGS: Musculoskeletal: Mild RIGHT hip osteoarthritis. No aggressive osseous lesions. Prominent Schmorl's node in the T12 superior endplate.  Lung Bases: Clear.  Liver:  Normal.  Spleen:  Normal.  Gallbladder:  Normal, normal.  Common bile duct:  Within normal limits for age.  Pancreas:  Normal.  Adrenal glands:  Normal.  Kidneys: Normal enhancement. Delayed excretion of contrast appears normal. LEFT ureter normal. RIGHT ureter normal.  Stomach:  Within normal limits.  Small bowel:  Normal.  Colon: Appendix not identified. No RIGHT lower quadrant inflammatory changes. Fluid levels are present in the proximal colon. Fluid levels extend through the transverse colon. The descending colon is collapsed. Scattered areas of diverticulosis. No obstruction.  Pelvic Genitourinary: Hysterectomy. Urinary bladder appears normal.  Peritoneum: No free fluid or free air.  Vasculature: Atherosclerosis.  Body Wall: Tiny fat containing periumbilical hernia.  IMPRESSION: 1. Abnormal but nonspecific air-fluid levels in the colon. These are most commonly associated with enteric infection. 2. Hysterectomy.   Electronically Signed   By: GDereck LigasM.D.   On: 04/26/2014 14:13    Review of Systems  HENT: Negative.   Eyes: Negative.   Respiratory: Negative.   Cardiovascular: Negative.   Gastrointestinal: Negative.   Genitourinary: Negative.   Musculoskeletal: Positive for back pain.  Skin: Negative.   Neurological: Positive for sensory change  and weakness.  Endo/Heme/Allergies: Negative.   Psychiatric/Behavioral:  Negative.     Blood pressure 136/51, pulse 57, temperature 97.9 F (36.6 C), resp. rate 14, SpO2 99 %. Physical Exam  Constitutional: She is oriented to person, place, and time. She appears well-developed and well-nourished.  HENT:  Head: Normocephalic and atraumatic.  Eyes: Conjunctivae and EOM are normal. Pupils are equal, round, and reactive to light.  Neck: Normal range of motion. Neck supple.  Cardiovascular: Normal rate and regular rhythm.   Respiratory: Effort normal and breath sounds normal.  GI: Soft. Bowel sounds are normal.  Musculoskeletal:  Pain and tenderness to palpation in right gluteus and right posterior superior iliac crest. Straight leg raising positive on the right side at 15. Patrick's maneuver also makes right leg hurt.  Neurological: She is alert and oriented to person, place, and time.  Motor function appears intact in iliopsoas quadricep gastrocs tibialis anterior on the right is slightly weak to 4 out of 5. Sensation appears intact in both lower extremities. Cranial nerve examination is within the limits of normal. Gait was not tested as patient cannot bear weight onto the right lower extremity.  Skin: Skin is warm and dry.  Psychiatric: She has a normal mood and affect. Her behavior is normal. Judgment and thought content normal.     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  Plan laminotomy foraminotomies and decompression of right L5 nerve root.  Lummie Montijo J 04/26/2014, 7:56 PM

## 2014-04-26 NOTE — ED Notes (Signed)
Pt has eaten [place] sandwich and apple sauce

## 2014-04-27 ENCOUNTER — Encounter (HOSPITAL_COMMUNITY): Payer: Self-pay | Admitting: Certified Registered"

## 2014-04-27 ENCOUNTER — Observation Stay (HOSPITAL_COMMUNITY): Payer: Medicare Other

## 2014-04-27 ENCOUNTER — Observation Stay (HOSPITAL_COMMUNITY): Payer: Medicare Other | Admitting: Certified Registered"

## 2014-04-27 ENCOUNTER — Encounter (HOSPITAL_COMMUNITY)
Admission: EM | Disposition: A | Discharge status: Home / Self Care | Payer: Self-pay | Source: Home / Self Care | Attending: Emergency Medicine

## 2014-04-27 DIAGNOSIS — M4726 Other spondylosis with radiculopathy, lumbar region: Secondary | ICD-10-CM | POA: Diagnosis not present

## 2014-04-27 DIAGNOSIS — I10 Essential (primary) hypertension: Secondary | ICD-10-CM | POA: Diagnosis not present

## 2014-04-27 DIAGNOSIS — M7138 Other bursal cyst, other site: Secondary | ICD-10-CM | POA: Diagnosis not present

## 2014-04-27 DIAGNOSIS — K219 Gastro-esophageal reflux disease without esophagitis: Secondary | ICD-10-CM | POA: Diagnosis not present

## 2014-04-27 DIAGNOSIS — Z4889 Encounter for other specified surgical aftercare: Secondary | ICD-10-CM | POA: Diagnosis not present

## 2014-04-27 HISTORY — PX: LUMBAR LAMINECTOMY/DECOMPRESSION MICRODISCECTOMY: SHX5026

## 2014-04-27 LAB — SURGICAL PCR SCREEN
MRSA, PCR: NEGATIVE
STAPHYLOCOCCUS AUREUS: POSITIVE — AB

## 2014-04-27 SURGERY — LUMBAR LAMINECTOMY/DECOMPRESSION MICRODISCECTOMY 1 LEVEL
Anesthesia: General | Site: Back

## 2014-04-27 MED ORDER — ROCURONIUM BROMIDE 100 MG/10ML IV SOLN
INTRAVENOUS | Status: DC | PRN
  Administered 2014-04-27: 50 mg via INTRAVENOUS

## 2014-04-27 MED ORDER — MORPHINE SULFATE 2 MG/ML IJ SOLN
1.0000 mg | INTRAMUSCULAR | Status: DC | PRN
  Administered 2014-04-28: 2 mg via INTRAVENOUS
  Filled 2014-04-27: qty 1

## 2014-04-27 MED ORDER — ONDANSETRON HCL 4 MG/2ML IJ SOLN
INTRAMUSCULAR | Status: DC | PRN
Start: 2014-04-27 — End: 2014-04-27
  Administered 2014-04-27: 4 mg via INTRAVENOUS

## 2014-04-27 MED ORDER — CEFAZOLIN SODIUM-DEXTROSE 2-3 GM-% IV SOLR
INTRAVENOUS | Status: DC | PRN
  Administered 2014-04-27: 2 g via INTRAVENOUS

## 2014-04-27 MED ORDER — SENNA 8.6 MG PO TABS
1.0000 | ORAL_TABLET | Freq: Two times a day (BID) | ORAL | Status: DC
  Administered 2014-04-27 – 2014-04-28 (×2): 8.6 mg via ORAL
  Filled 2014-04-27 (×2): qty 1

## 2014-04-27 MED ORDER — PHENOL 1.4 % MT LIQD: 1.0000 | OROMUCOSAL | Status: DC | PRN

## 2014-04-27 MED ORDER — FENTANYL CITRATE (PF) 100 MCG/2ML IJ SOLN
INTRAMUSCULAR | Status: DC | PRN
  Administered 2014-04-27: 250 ug via INTRAVENOUS

## 2014-04-27 MED ORDER — CEFAZOLIN SODIUM 1-5 GM-% IV SOLN
1.0000 g | Freq: Three times a day (TID) | INTRAVENOUS | Status: AC
  Administered 2014-04-27 (×2): 1 g via INTRAVENOUS
  Filled 2014-04-27 (×3): qty 50

## 2014-04-27 MED ORDER — 0.9 % SODIUM CHLORIDE (POUR BTL) OPTIME
TOPICAL | Status: DC | PRN
  Administered 2014-04-27: 1000 mL

## 2014-04-27 MED ORDER — MUPIROCIN 2 % EX OINT
1.0000 "application " | TOPICAL_OINTMENT | Freq: Two times a day (BID) | CUTANEOUS | Status: DC
  Administered 2014-04-27 – 2014-04-28 (×2): 1 via NASAL
  Filled 2014-04-27: qty 22

## 2014-04-27 MED ORDER — METHOCARBAMOL 500 MG PO TABS: 500.0000 mg | ORAL_TABLET | Freq: Four times a day (QID) | ORAL | Status: DC | PRN

## 2014-04-27 MED ORDER — DOCUSATE SODIUM 100 MG PO CAPS
100.0000 mg | ORAL_CAPSULE | Freq: Two times a day (BID) | ORAL | Status: DC
  Administered 2014-04-27 – 2014-04-28 (×2): 100 mg via ORAL
  Filled 2014-04-27 (×2): qty 1

## 2014-04-27 MED ORDER — KETOROLAC TROMETHAMINE 15 MG/ML IJ SOLN
15.0000 mg | Freq: Four times a day (QID) | INTRAMUSCULAR | Status: DC
  Administered 2014-04-27 – 2014-04-28 (×4): 15 mg via INTRAVENOUS
  Filled 2014-04-27 (×4): qty 1

## 2014-04-27 MED ORDER — DEXAMETHASONE SODIUM PHOSPHATE 4 MG/ML IJ SOLN
INTRAMUSCULAR | Status: DC | PRN
  Administered 2014-04-27: 8 mg via INTRAVENOUS

## 2014-04-27 MED ORDER — SODIUM CHLORIDE 0.9 % IJ SOLN: 3.0000 mL | INTRAMUSCULAR | Status: DC | PRN

## 2014-04-27 MED ORDER — HEMOSTATIC AGENTS (NO CHARGE) OPTIME
TOPICAL | Status: DC | PRN
  Administered 2014-04-27: 1 via TOPICAL

## 2014-04-27 MED ORDER — BISACODYL 10 MG RE SUPP: 10.0000 mg | Freq: Every day | RECTAL | Status: DC | PRN

## 2014-04-27 MED ORDER — POLYETHYLENE GLYCOL 3350 17 G PO PACK
17.0000 g | PACK | Freq: Every day | ORAL | Status: DC | PRN
Start: 2014-04-27 — End: 2014-04-28

## 2014-04-27 MED ORDER — GLYCOPYRROLATE 0.2 MG/ML IJ SOLN
INTRAMUSCULAR | Status: AC
  Filled 2014-04-27: qty 3

## 2014-04-27 MED ORDER — HYDROMORPHONE HCL 1 MG/ML IJ SOLN
0.2500 mg | INTRAMUSCULAR | Status: DC | PRN
  Administered 2014-04-27: 0.5 mg via INTRAVENOUS
  Filled 2014-04-27: qty 1

## 2014-04-27 MED ORDER — SODIUM CHLORIDE 0.9 % IR SOLN
Status: DC | PRN
  Administered 2014-04-27: 11:00:00

## 2014-04-27 MED ORDER — SODIUM CHLORIDE 0.9 % IV SOLN: 250.0000 mL | INTRAVENOUS | Status: DC

## 2014-04-27 MED ORDER — PROPOFOL 10 MG/ML IV BOLUS
INTRAVENOUS | Status: DC | PRN
  Administered 2014-04-27: 80 mg via INTRAVENOUS

## 2014-04-27 MED ORDER — OXYCODONE-ACETAMINOPHEN 5-325 MG PO TABS: 1.0000 | ORAL_TABLET | ORAL | Status: DC | PRN

## 2014-04-27 MED ORDER — LACTATED RINGERS IV SOLN
INTRAVENOUS | Status: DC | PRN
  Administered 2014-04-27 (×2): via INTRAVENOUS

## 2014-04-27 MED ORDER — MENTHOL 3 MG MT LOZG: 1.0000 | LOZENGE | OROMUCOSAL | Status: DC | PRN

## 2014-04-27 MED ORDER — PHENYLEPHRINE 40 MCG/ML (10ML) SYRINGE FOR IV PUSH (FOR BLOOD PRESSURE SUPPORT)
PREFILLED_SYRINGE | INTRAVENOUS | Status: AC
  Filled 2014-04-27: qty 10

## 2014-04-27 MED ORDER — THROMBIN 5000 UNITS EX SOLR
CUTANEOUS | Status: DC | PRN
  Administered 2014-04-27 (×2): 5000 [IU] via TOPICAL

## 2014-04-27 MED ORDER — ARTIFICIAL TEARS OP OINT
TOPICAL_OINTMENT | OPHTHALMIC | Status: DC | PRN
  Administered 2014-04-27: 1 via OPHTHALMIC

## 2014-04-27 MED ORDER — ACETAMINOPHEN 325 MG PO TABS: 650.0000 mg | ORAL_TABLET | ORAL | Status: DC | PRN

## 2014-04-27 MED ORDER — ONDANSETRON HCL 4 MG/2ML IJ SOLN
INTRAMUSCULAR | Status: AC
Start: 2014-04-27 — End: 2014-04-27
  Filled 2014-04-27: qty 2

## 2014-04-27 MED ORDER — NEOSTIGMINE METHYLSULFATE 10 MG/10ML IV SOLN
INTRAVENOUS | Status: AC
  Filled 2014-04-27: qty 1

## 2014-04-27 MED ORDER — PROMETHAZINE HCL 25 MG/ML IJ SOLN
6.2500 mg | INTRAMUSCULAR | Status: DC | PRN
Start: 2014-04-27 — End: 2014-04-28

## 2014-04-27 MED ORDER — ACETAMINOPHEN 650 MG RE SUPP: 650.0000 mg | RECTAL | Status: DC | PRN

## 2014-04-27 MED ORDER — PHENYLEPHRINE HCL 10 MG/ML IJ SOLN
INTRAMUSCULAR | Status: DC | PRN
  Administered 2014-04-27: 80 ug via INTRAVENOUS

## 2014-04-27 MED ORDER — FENTANYL CITRATE (PF) 250 MCG/5ML IJ SOLN
INTRAMUSCULAR | Status: AC
  Filled 2014-04-27: qty 5

## 2014-04-27 MED ORDER — MIDAZOLAM HCL 2 MG/2ML IJ SOLN: 0.5000 mg | Freq: Once | INTRAMUSCULAR | Status: AC | PRN

## 2014-04-27 MED ORDER — HYDROCODONE-ACETAMINOPHEN 5-325 MG PO TABS
1.0000 | ORAL_TABLET | ORAL | Status: DC | PRN
  Administered 2014-04-27: 2 via ORAL
  Filled 2014-04-27: qty 2

## 2014-04-27 MED ORDER — MEPERIDINE HCL 25 MG/ML IJ SOLN: 6.2500 mg | INTRAMUSCULAR | Status: DC | PRN

## 2014-04-27 MED ORDER — BUPIVACAINE HCL (PF) 0.5 % IJ SOLN
INTRAMUSCULAR | Status: DC | PRN
  Administered 2014-04-27: 10 mL
  Administered 2014-04-27: 8 mL

## 2014-04-27 MED ORDER — MIDAZOLAM HCL 5 MG/5ML IJ SOLN
INTRAMUSCULAR | Status: DC | PRN
  Administered 2014-04-27: 2 mg via INTRAVENOUS

## 2014-04-27 MED ORDER — METHOCARBAMOL 1000 MG/10ML IJ SOLN
500.0000 mg | Freq: Four times a day (QID) | INTRAMUSCULAR | Status: DC | PRN
  Filled 2014-04-27: qty 5

## 2014-04-27 MED ORDER — ONDANSETRON HCL 4 MG/2ML IJ SOLN: 4.0000 mg | INTRAMUSCULAR | Status: DC | PRN

## 2014-04-27 MED ORDER — MIDAZOLAM HCL 2 MG/2ML IJ SOLN
INTRAMUSCULAR | Status: AC
  Filled 2014-04-27: qty 2

## 2014-04-27 MED ORDER — LIDOCAINE-EPINEPHRINE 1 %-1:100000 IJ SOLN
INTRAMUSCULAR | Status: DC | PRN
  Administered 2014-04-27: 8 mL

## 2014-04-27 MED ORDER — CHLORHEXIDINE GLUCONATE CLOTH 2 % EX PADS
6.0000 | MEDICATED_PAD | Freq: Every day | CUTANEOUS | Status: DC
  Administered 2014-04-27: 6 via TOPICAL

## 2014-04-27 MED ORDER — LIDOCAINE HCL (CARDIAC) 20 MG/ML IV SOLN
INTRAVENOUS | Status: DC | PRN
  Administered 2014-04-27: 20 mg via INTRAVENOUS

## 2014-04-27 MED ORDER — NEOSTIGMINE METHYLSULFATE 10 MG/10ML IV SOLN
INTRAVENOUS | Status: DC | PRN
  Administered 2014-04-27: 4 mg via INTRAVENOUS

## 2014-04-27 MED ORDER — PROPOFOL 10 MG/ML IV BOLUS
INTRAVENOUS | Status: AC
  Filled 2014-04-27: qty 20

## 2014-04-27 MED ORDER — SODIUM CHLORIDE 0.9 % IJ SOLN: 3.0000 mL | Freq: Two times a day (BID) | INTRAMUSCULAR | Status: DC

## 2014-04-27 MED ORDER — GLYCOPYRROLATE 0.2 MG/ML IJ SOLN
INTRAMUSCULAR | Status: DC | PRN
  Administered 2014-04-27: 0.6 mg via INTRAVENOUS

## 2014-04-27 SURGICAL SUPPLY — 54 items
ADH SKN CLS APL DERMABOND .7 (GAUZE/BANDAGES/DRESSINGS) ×1
BAG DECANTER FOR FLEXI CONT (MISCELLANEOUS) ×2 IMPLANT
BLADE CLIPPER SURG (BLADE) IMPLANT
BUR ACORN 6.0 (BURR) IMPLANT
BUR MATCHSTICK NEURO 3.0 LAGG (BURR) ×2 IMPLANT
CANISTER SUCT 3000ML PPV (MISCELLANEOUS) ×2 IMPLANT
CONT SPEC 4OZ CLIKSEAL STRL BL (MISCELLANEOUS) ×2 IMPLANT
DECANTER SPIKE VIAL GLASS SM (MISCELLANEOUS) ×2 IMPLANT
DERMABOND ADVANCED (GAUZE/BANDAGES/DRESSINGS) ×1
DERMABOND ADVANCED .7 DNX12 (GAUZE/BANDAGES/DRESSINGS) IMPLANT
DRAPE LAPAROTOMY T 102X78X121 (DRAPES) ×2 IMPLANT
DRAPE MICROSCOPE LEICA (MISCELLANEOUS) ×1 IMPLANT
DRAPE POUCH INSTRU U-SHP 10X18 (DRAPES) ×2 IMPLANT
DRAPE PROXIMA HALF (DRAPES) IMPLANT
DURAPREP 26ML APPLICATOR (WOUND CARE) ×2 IMPLANT
ELECT REM PT RETURN 9FT ADLT (ELECTROSURGICAL) ×2
ELECTRODE REM PT RTRN 9FT ADLT (ELECTROSURGICAL) ×1 IMPLANT
GAUZE SPONGE 4X4 12PLY STRL (GAUZE/BANDAGES/DRESSINGS) ×2 IMPLANT
GAUZE SPONGE 4X4 16PLY XRAY LF (GAUZE/BANDAGES/DRESSINGS) IMPLANT
GLOVE BIOGEL PI IND STRL 8.5 (GLOVE) ×1 IMPLANT
GLOVE BIOGEL PI INDICATOR 8.5 (GLOVE) ×1
GLOVE ECLIPSE 8.5 STRL (GLOVE) ×1 IMPLANT
GLOVE EXAM NITRILE LRG STRL (GLOVE) IMPLANT
GLOVE EXAM NITRILE MD LF STRL (GLOVE) IMPLANT
GLOVE EXAM NITRILE XL STR (GLOVE) IMPLANT
GLOVE EXAM NITRILE XS STR PU (GLOVE) IMPLANT
GLOVE SS N UNI LF 7.5 STRL (GLOVE) ×3 IMPLANT
GLOVE SS N UNI LF 8.0 STRL (GLOVE) ×2 IMPLANT
GLOVE SS N UNI LF 8.5 STRL (GLOVE) ×2 IMPLANT
GOWN STRL REUS W/ TWL LRG LVL3 (GOWN DISPOSABLE) IMPLANT
GOWN STRL REUS W/ TWL XL LVL3 (GOWN DISPOSABLE) IMPLANT
GOWN STRL REUS W/TWL 2XL LVL3 (GOWN DISPOSABLE) ×3 IMPLANT
GOWN STRL REUS W/TWL LRG LVL3 (GOWN DISPOSABLE)
GOWN STRL REUS W/TWL XL LVL3 (GOWN DISPOSABLE) ×4
KIT BASIN OR (CUSTOM PROCEDURE TRAY) ×2 IMPLANT
KIT ROOM TURNOVER OR (KITS) ×2 IMPLANT
LIQUID BAND (GAUZE/BANDAGES/DRESSINGS) ×1 IMPLANT
NDL SPNL 20GX3.5 QUINCKE YW (NEEDLE) IMPLANT
NEEDLE HYPO 22GX1.5 SAFETY (NEEDLE) ×2 IMPLANT
NEEDLE SPNL 20GX3.5 QUINCKE YW (NEEDLE) ×2 IMPLANT
NS IRRIG 1000ML POUR BTL (IV SOLUTION) ×2 IMPLANT
PACK LAMINECTOMY NEURO (CUSTOM PROCEDURE TRAY) ×2 IMPLANT
PAD ARMBOARD 7.5X6 YLW CONV (MISCELLANEOUS) ×6 IMPLANT
PATTIES SURGICAL .5 X1 (DISPOSABLE) ×2 IMPLANT
RUBBERBAND STERILE (MISCELLANEOUS) ×2 IMPLANT
SPONGE SURGIFOAM ABS GEL SZ50 (HEMOSTASIS) ×2 IMPLANT
SUT VIC AB 1 CT1 18XBRD ANBCTR (SUTURE) ×1 IMPLANT
SUT VIC AB 1 CT1 8-18 (SUTURE) ×2
SUT VIC AB 2-0 CP2 18 (SUTURE) ×2 IMPLANT
SUT VIC AB 3-0 SH 8-18 (SUTURE) ×2 IMPLANT
SYR 20ML ECCENTRIC (SYRINGE) ×2 IMPLANT
TOWEL OR 17X24 6PK STRL BLUE (TOWEL DISPOSABLE) ×2 IMPLANT
TOWEL OR 17X26 10 PK STRL BLUE (TOWEL DISPOSABLE) ×2 IMPLANT
WATER STERILE IRR 1000ML POUR (IV SOLUTION) ×2 IMPLANT

## 2014-04-27 NOTE — Progress Notes (Signed)
Pt's MRSA PCR results came back negative for MRSA but positive for Staph Aureus, Dr Ellene Route paged to be notified at 0645, yet to call back, same passed on to the day shift RN Angela,pt also notified of result and was reassured,quiet in bed. Obasogie-Asidi, Kamoria Lucien Efe

## 2014-04-27 NOTE — Op Note (Signed)
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 Anesthesia: Gen. endotracheal Indications: Amber Brooks is a 69 year old individual is had significant problems with back and right lower extremity pain she has evidence of a significant synovial cyst on the right side at L4-L5 . This has acutely become inflamed. She's been advised regarding surgical decompression.  Procedure: Patient was brought to the operating room supine on a stretcher. After the smooth induction of general endotracheal anesthesia, she was turned prone onto the operating table. The bony prominences were appropriately padded and protected. The back was prepped with alcohol and DuraPrep and draped in a sterile fashion. A midline incision was made over L4-L5 after this area was localized with a spinal needle. Dissection was carried down through the lumbar dorsal fascia on the right side and the interlaminar space at L4-L5 was cleared and a self-retaining retractor was placed in the wound. A laminotomy was created removing the inferior marginal lamina of L4 out to the medial wall the facet. The dissection was then continued using a 2 and 3 mm Kerrison punch to undercut the laminar arch and remove thickened redundant yellow ligament. In the inferior aspect of this dissection was encountered a large cyst with thick gelatinous fluid within it this fluid was removed and the outer wall the cyst could be easily dissected away from the dura. There is noted to be a layer of hemorrhage between the cyst wall and the dura itself. This area was completely decompressed. The the lateral aspect  of the cyst was laid up against the L5 nerve root. Removal of this decompress the L5 nerve root. In the end area was inspected carefully no remnants of the cyst were encountered the L5 nerve root and the common dural tube were well decompressed. The wound was irrigated copiously with saline and then the lumbar dorsal fascia was closed with #1 Vicryl. 10 mL of half percent Marcaine was injected into the paraspinous fascia. 2-0 Vicryl is used to close the subcutaneous tenia's tissues and 3-0 Vicryl to close subcutaneous take her skin. Dermabond was used on the skin. Blood loss was estimated at less than 50 mL.

## 2014-04-27 NOTE — Anesthesia Preprocedure Evaluation (Addendum)
Anesthesia Evaluation  Patient identified by MRN, date of birth, ID band Patient awake    Reviewed: Allergy & Precautions, NPO status , Patient's Chart, lab work & pertinent test results  History of Anesthesia Complications (+) PONV and history of anesthetic complications  Airway Mallampati: I  TM Distance: >3 FB Neck ROM: Full    Dental  (+) Teeth Intact, Dental Advisory Given   Pulmonary neg pulmonary ROS,  breath sounds clear to auscultation        Cardiovascular hypertension, Pt. on medications and Pt. on home beta blockers - angina+ Peripheral Vascular Disease Rhythm:Regular Rate:Normal  4/16 ECHO: EF 60-65%, valves OK 4/16 Myoview: EF 76%, no ischemia   Neuro/Psych  Headaches, Anxiety Depression S/p craniotomy for aneurysm clipping Back pain to R leg     GI/Hepatic GERD-  Medicated and Controlled,Mildly elevated LFTs   Endo/Other  negative endocrine ROS  Renal/GU negative Renal ROS     Musculoskeletal   Abdominal   Peds  Hematology negative hematology ROS (+)   Anesthesia Other Findings   Reproductive/Obstetrics                          Anesthesia Physical Anesthesia Plan  ASA: III  Anesthesia Plan: General   Post-op Pain Management:    Induction: Intravenous  Airway Management Planned: Oral ETT  Additional Equipment:   Intra-op Plan:   Post-operative Plan: Extubation in OR  Informed Consent: I have reviewed the patients History and Physical, chart, labs and discussed the procedure including the risks, benefits and alternatives for the proposed anesthesia with the patient or authorized representative who has indicated his/her understanding and acceptance.   Dental advisory given  Plan Discussed with: CRNA and Surgeon  Anesthesia Plan Comments: (Plan routine monitors, GETA)        Anesthesia Quick Evaluation

## 2014-04-27 NOTE — Anesthesia Procedure Notes (Signed)
Procedure Name: Intubation Date/Time: 04/27/2014 11:23 AM Performed by: Gaylene Brooks Pre-anesthesia Checklist: Patient being monitored, Suction available, Emergency Drugs available, Patient identified and Timeout performed Patient Re-evaluated:Patient Re-evaluated prior to inductionOxygen Delivery Method: Circle system utilized Preoxygenation: Pre-oxygenation with 100% oxygen Intubation Type: IV induction Ventilation: Mask ventilation without difficulty Laryngoscope Size: Miller and 2 Grade View: Grade II Tube size: 7.0 mm Number of attempts: 1 Airway Equipment and Method: Stylet Placement Confirmation: ETT inserted through vocal cords under direct vision,  breath sounds checked- equal and bilateral,  positive ETCO2 and CO2 detector Secured at: 22 cm Tube secured with: Tape Dental Injury: Teeth and Oropharynx as per pre-operative assessment

## 2014-04-27 NOTE — Progress Notes (Signed)
UR completed 

## 2014-04-27 NOTE — Progress Notes (Signed)
Patient up to void on bedside commode; increased pain with activity; medicated prn; administered her beta blocker; pre-op check list is complete; transport has arrived to take her to the OR.

## 2014-04-27 NOTE — Transfer of Care (Signed)
Immediate Anesthesia Transfer of Care Note  Patient: Amber Brooks  Procedure(s) Performed: Procedure(s): LUMBAR LAMINECTOMY/DECOMPRESSION MICRODISCECTOMY LUMBAR FOUR-FIVE  (N/A)  Patient Location: PACU  Anesthesia Type:General  Level of Consciousness: awake, alert  and oriented  Airway & Oxygen Therapy: Patient Spontanous Breathing and Patient connected to nasal cannula oxygen  Post-op Assessment: Report given to RN, Post -op Vital signs reviewed and stable and Patient moving all extremities X 4  Post vital signs: Reviewed and stable  Last Vitals:  Filed Vitals:   04/27/14 0940  BP: 126/48  Pulse: 81  Temp: 36.4 C  Resp: 16    Complications: No apparent anesthesia complications

## 2014-04-27 NOTE — Anesthesia Postprocedure Evaluation (Signed)
  Anesthesia Post-op Note  Patient: Amber Brooks  Procedure(s) Performed: Procedure(s): LUMBAR LAMINECTOMY/DECOMPRESSION MICRODISCECTOMY LUMBAR FOUR-FIVE  (N/A)  Patient Location: PACU  Anesthesia Type:General  Level of Consciousness: awake, alert , oriented and patient cooperative  Airway and Oxygen Therapy: Patient Spontanous Breathing and Patient connected to nasal cannula oxygen  Post-op Pain: none  Post-op Assessment: Post-op Vital signs reviewed, Patient's Cardiovascular Status Stable, Respiratory Function Stable, Patent Airway, No signs of Nausea or vomiting and Pain level controlled  Post-op Vital Signs: Reviewed and stable  Last Vitals:  Filed Vitals:   04/27/14 1332  BP: 132/55  Pulse: 60  Temp: 36.3 C  Resp: 16    Complications: No apparent anesthesia complications

## 2014-04-27 NOTE — Progress Notes (Signed)
Pt admitted from ED with c/o of lower back pain radiating to the right leg,rates pain level at 8 but denies need for medication at this time, pt settled in bed,family at bedside and call light within pt's reach, v/s stable, will continue to monitor. Obasogie-Asidi, Aesha Agrawal Efe

## 2014-04-27 NOTE — Progress Notes (Signed)
Patient ID: Amber Brooks, female   DOB: 12/15/1945, 69 y.o.   MRN: [id number] Ms. Montour is awake and alert and feeling quite well. Her leg pain is completely gone. She has been to the bathroom already. She is ready to go home tomorrow morning.

## 2014-04-28 ENCOUNTER — Encounter (HOSPITAL_COMMUNITY): Payer: Self-pay | Admitting: Neurological Surgery

## 2014-04-28 MED ORDER — METHOCARBAMOL 500 MG PO TABS: 500.0000 mg | ORAL_TABLET | Freq: Four times a day (QID) | ORAL | Status: DC

## 2014-04-28 MED ORDER — PROMETHAZINE HCL 12.5 MG PO TABS
12.5000 mg | ORAL_TABLET | Freq: Four times a day (QID) | ORAL | Status: DC | PRN
Start: 2014-04-28 — End: 2016-07-21

## 2014-04-28 MED ORDER — HYDROCODONE-ACETAMINOPHEN 5-325 MG PO TABS: 1.0000 | ORAL_TABLET | Freq: Four times a day (QID) | ORAL | Status: DC | PRN

## 2014-04-28 NOTE — Progress Notes (Signed)
UR completed 

## 2014-04-28 NOTE — Evaluation (Signed)
Physical Therapy Evaluation Patient Details Name: Amber Brooks MRN: [id number] DOB: August 16, 1945 Today's Date: 04/28/2014   History of Present Illness  69 yo female s/p L4-5 laminectomy/ decompression  Clinical Impression  Pt is able to walk with supervision and RW, and is planning to go home with mainly family assistance.  Has agreed to getting RW and HHPT for follow-up and safety, and is hoping to return to work ASAP per MD recommendations.    Follow Up Recommendations Home health PT;Supervision/Assistance - 24 hour (initially)    Equipment Recommendations  Rolling walker with 5" wheels    Recommendations for Other Services       Precautions / Restrictions Precautions Precautions: Back Precaution Booklet Issued: Yes (comment) Precaution Comments: handout provided and reviewed Restrictions Weight Bearing Restrictions: No      Mobility  Bed Mobility Overal bed mobility: Modified Independent                Transfers Overall transfer level: Modified independent Equipment used: Rolling walker (2 wheeled);1 person hand held assist             General transfer comment: Pt tends to forget hand placement and explained the reason for it  Ambulation/Gait Ambulation/Gait assistance: Supervision;Min guard Ambulation Distance (Feet): 150 Feet Assistive device: Rolling walker (2 wheeled) Gait Pattern/deviations: Step-through pattern;Shuffle;Wide base of support (Drifting backward at times) Gait velocity: reduced Gait velocity interpretation: Below normal speed for age/gender General Gait Details: Pt is unsteady and losing balance to L before addition of walker, which also helped to control the backward lean in gait.  Pt very nonchalant with PT about the concerns   Stairs Stairs: Yes Stairs assistance: Min guard Stair Management: No rails;One rail Right Number of Stairs: 10 General stair comments: Used SPC on stairs with second set, as pt could not control without  railing.  SPC is not enough help to walk up alone  Wheelchair Mobility    Modified Rankin (Stroke Patients Only)       Balance Overall balance assessment: Needs assistance Sitting-balance support: Feet supported Sitting balance-Leahy Scale: Good   Postural control: Posterior lean Standing balance support: Bilateral upper extremity supported Standing balance-Leahy Scale: Poor                   Standardized Balance Assessment Standardized Balance Assessment : Dynamic Gait Index   Dynamic Gait Index Level Surface: Mild Impairment Change in Gait Speed: Mild Impairment       Pertinent Vitals/Pain Pain Assessment: 0-10 Pain Score: 5  Pain Location: R back Pain Intervention(s): Limited activity within patient's tolerance;Premedicated before session    Home Living Family/patient expects to be discharged to:: Private residence Living Arrangements: Spouse/significant other Available Help at Discharge: Family;Friend(s);Available PRN/intermittently Type of Home: House Home Access: Stairs to enter Entrance Stairs-Rails: None Entrance Stairs-Number of Steps: 4 Home Layout: Two level;Able to live on main level with bedroom/bathroom Home Equipment: None 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    Prior Function Level of Independence: Independent               Hand Dominance   Dominant Hand: Right    Extremity/Trunk Assessment   Upper Extremity Assessment: Overall WFL for tasks assessed           Lower Extremity Assessment: Generalized weakness      Cervical / Trunk Assessment: Normal  Communication   Communication: No difficulties  Cognition Arousal/Alertness: Awake/alert Behavior During Therapy: Impulsive (at times, reluctant to commit  to AD) Overall Cognitive Status: No family/caregiver present to determine baseline cognitive functioning       Memory: Decreased recall of precautions (Pt moves in breach of  precautions)              General Comments General comments (skin integrity, edema, etc.): Pt is listing bacik and to the L with gait and presents a real fallrisk.  Her plan is to go home with intermittent help but will need assistance of one on stairs and one RW for level surface gait    Exercises        Assessment/Plan    PT Assessment Patient needs continued PT services  PT Diagnosis Acute pain;Generalized weakness   PT Problem List Decreased strength;Decreased range of motion;Decreased activity tolerance;Decreased balance;Decreased mobility;Decreased coordination;Decreased knowledge of use of DME;Decreased safety awareness;Decreased knowledge of precautions;Pain;Decreased skin integrity  PT Treatment Interventions DME instruction;Gait training;Stair training;Functional mobility training;Therapeutic activities;Therapeutic exercise;Balance training;Neuromuscular re-education;Patient/family education   PT Goals (Current goals can be found in the Care Plan section) Acute Rehab PT Goals Patient Stated Goal: return to work in few weeks PT Goal Formulation: With patient Time For Goal Achievement: 05/26/14 Potential to Achieve Goals: Good    Frequency Min 4X/week   Barriers to discharge Inaccessible home environment;Decreased caregiver support      Co-evaluation               End of Session Equipment Utilized During Treatment: Gait belt Activity Tolerance: Patient tolerated treatment well;Patient limited by pain Patient left: in chair;with call bell/phone within reach Nurse Communication: Mobility status    Functional Assessment Tool Used: clinical judgment Functional Limitation: Mobility: Walking and moving around Mobility: Walking and Moving Around Current Status (B3419): At least 20 percent but less than 40 percent impaired, limited or restricted Mobility: Walking and Moving Around Goal Status [phone]): At least 1 percent but less than 20 percent impaired, limited or  restricted    Time: 0938-1001 PT Time Calculation (min) (ACUTE ONLY): 23 min   Charges:   PT Evaluation $Initial PT Evaluation Tier I: 1 Procedure PT Treatments $Gait Training: 8-22 mins   PT G Codes:   PT G-Codes **NOT FOR INPATIENT CLASS** Functional Assessment Tool Used: clinical judgment Functional Limitation: Mobility: Walking and moving around Mobility: Walking and Moving Around Current Status (I0973): At least 20 percent but less than 40 percent impaired, limited or restricted Mobility: Walking and Moving Around Goal Status [phone]): At least 1 percent but less than 20 percent impaired, limited or restricted    Ramond Dial 04/28/2014, 10:34 AM  Mee Hives, PT MS Acute Rehab Dept. Number: [phone]

## 2014-04-28 NOTE — Progress Notes (Signed)
Patient discharged home with husband. Patient escorted to car by RN. IV removed. Patient refuses pain medication prior to discharge, neuro assessment intact.

## 2014-04-28 NOTE — Evaluation (Signed)
Occupational Therapy Evaluation Patient Details Name: Amber Brooks MRN: [id number] DOB: [birth year] Today's Date: 04/28/2014    History of Present Illness 69 yo female s/p L4-5 laminectomy/ decompression   Clinical Impression   Patient is s/p L4-5 laminectomy / decompression surgery resulting in functional limitations due to the deficits listed below (see OT problem list). Pt demonstrates mild LOB to the L side during ambulation and reports favoring the L side out of fear to weight bear on the R. Pt provided encouragement and longer distance to ambulate with some self correction noted. Pt educated on back precautions. Patient will benefit from skilled OT acutely to increase independence and safety with ADLS to allow discharge no follow up / dc home. Pt cautioned to adhere to back precautions even though pt reports it doesn't hurt to do it this way ( attempting to bend).      Follow Up Recommendations  No OT follow up    Equipment Recommendations  None recommended by OT    Recommendations for Other Services       Precautions / Restrictions Precautions Precautions: Back Precaution Comments: handout provided and reviewed      Mobility Bed Mobility Overal bed mobility: Modified Independent                Transfers Overall transfer level: Modified independent                    Balance Overall balance assessment: Needs assistance         Standing balance support: No upper extremity supported;During functional activity Standing balance-Leahy Scale: Fair                   Standardized Balance Assessment Standardized Balance Assessment : Dynamic Gait Index   Dynamic Gait Index Level Surface: Mild Impairment Change in Gait Speed: Mild Impairment      ADL Overall ADL's : Modified independent                                       General ADL Comments: cues fro back safety and reviewed precautions     Vision Vision  Assessment?: No apparent visual deficits   Perception     Praxis      Pertinent Vitals/Pain Pain Assessment: No/denies pain     Hand Dominance Right   Extremity/Trunk Assessment Upper Extremity Assessment Upper Extremity Assessment: Defer to OT evaluation   Lower Extremity Assessment Lower Extremity Assessment: Defer to PT evaluation   Cervical / Trunk Assessment Cervical / Trunk Assessment: Normal   Communication Communication Communication: No difficulties   Cognition Arousal/Alertness: Awake/alert Behavior During Therapy: WFL for tasks assessed/performed Overall Cognitive Status: Within Functional Limits for tasks assessed                     General Comments       Exercises       Shoulder Instructions      Home Living Family/patient expects to be discharged to:: Private residence Living Arrangements: Spouse/significant other (boyfriend) Available Help at Discharge: Family;Friend(s);Available PRN/intermittently Type of Home: House Home Access: Stairs to enter [employer] of Steps: 4 Entrance Stairs-Rails: None Home Layout: Two level;Able to live on main level with bedroom/bathroom     Bathroom Shower/Tub: Teacher, early years/pre: Standard     Home Equipment: None   Additional Comments: driving, work as a Psychologist, clinical  assistant       Prior Functioning/Environment Level of Independence: Independent             OT Diagnosis: Generalized weakness;Acute pain   OT Problem List:     OT Treatment/Interventions:      OT Goals(Current goals can be found in the care plan section) Acute Rehab OT Goals Patient Stated Goal: return to work in few weeks  OT Frequency:     Barriers to D/C:            Co-evaluation              End of Session Nurse Communication: Mobility status;Precautions  Activity Tolerance: Patient tolerated treatment well Patient left: in chair;with call bell/phone within reach   Time:  0720-0746 OT Time Calculation (min): 26 min Charges:  OT General Charges $OT Visit: 1 Procedure OT Evaluation $Initial OT Evaluation Tier I: 1 Procedure OT Treatments $Self Care/Home Management : 8-22 mins G-Codes: OT G-codes **NOT FOR INPATIENT CLASS** Functional Assessment Tool Used: clinical judgement Functional Limitation: Self care Self Care Current Status (A0762): At least 1 percent but less than 20 percent impaired, limited or restricted Self Care Goal Status (U6333): At least 1 percent but less than 20 percent impaired, limited or restricted Self Care Discharge Status [phone]): At least 1 percent but less than 20 percent impaired, limited or restricted  Peri Maris 04/28/2014, 7:51 AM  Pager: [phone]

## 2014-04-28 NOTE — Clinical Social Work Note (Signed)
CSW consult acknowledged:  Clinical Education officer, museum received a consult for SNF placement. PT/OT is currently recommending Home Health and No OT follow up.   Clinical Social Worker will sign off for now as social work intervention is no longer needed. Please consult [place] again if new need arises.  Amber Brooks, MSW, LCSWA [phone] 04/28/2014 11:10 AM

## 2014-04-28 NOTE — Progress Notes (Signed)
RN discussed discharge instructions and prescriptions with patient. Patient vocalized understanding. Discharge instructions and prescriptions given to patient.

## 2014-04-28 NOTE — Discharge Summary (Signed)
Physician Discharge Summary  Patient ID: Amber Brooks MRN: [id number] DOB/AGE: [birth year] 69 y.o.  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 Shadowing surgical decompression at the L4-5 level for a synovial cyst. She is much better.  Consults: None  Significant Diagnostic Studies: None  Treatments: surgery: Laminotomy and foraminotomies L4-5 right decompression of L5 nerve root  Discharge Exam: Blood pressure 124/60, pulse 63, temperature 98.2 F (36.8 C), temperature source Oral, resp. rate 20, height 5\' 2"  (1.575 m), weight 45.9 kg (101 lb 3.1 oz), SpO2 99 %. Incision is clean and dry motor function is intact in lower extremities.  Disposition: 01-Home or Self Care  Discharge Instructions    Call MD for:  redness, tenderness, or signs of infection (pain, swelling, redness, odor or green/yellow discharge around incision site)    Complete by:  As directed      Call MD for:  severe uncontrolled pain    Complete by:  As directed      Call MD for:  temperature >100.4    Complete by:  As directed      Diet - low sodium heart healthy    Complete by:  As directed      Discharge instructions    Complete by:  As directed   Okay to shower. Do not apply salves or appointments to incision. No heavy lifting with the upper extremities greater than 15 pounds. May resume driving when not requiring pain medication and patient feels comfortable with doing so.     Increase activity slowly    Complete by:  As directed             Medication List    TAKE these medications        ALPRAZolam 0.5 MG tablet  Commonly known  as:  XANAX  Take 0.5 mg by mouth at bedtime as needed for sleep or anxiety.     amLODipine 10 MG tablet  Commonly known as:  NORVASC  Take 10 mg by mouth daily.     aspirin 81 MG chewable tablet  Chew 81 mg by mouth daily.     BENADRYL 25 MG tablet  Generic drug:  diphenhydrAMINE  Take 25 mg by mouth daily as needed for allergies.     Biotin 5000 MCG Caps  Take 3 capsules by mouth daily.     esomeprazole 40 MG capsule  Commonly known as:  NEXIUM  Take 40 mg by mouth daily before breakfast.     estradiol 1 MG tablet  Commonly known as:  ESTRACE  Take 0.5 mg by mouth every other day.     ezetimibe-simvastatin 10-40 MG per tablet  Commonly known as:  VYTORIN  Take 1 tablet by mouth at bedtime.     HYDROcodone-acetaminophen 5-325 MG per tablet  Commonly known as:  NORCO  Take 1 tablet by mouth every 6 (six) hours as needed for moderate pain.     hyoscyamine 0.125 MG tablet  Commonly known as:  LEVSIN, ANASPAZ  Take 0.125 mg by mouth every 4 (four) hours as needed for bladder spasms or cramping.     ibuprofen 800 MG tablet  Commonly known as:  ADVIL,MOTRIN  Take 800 mg  by mouth every 8 (eight) hours as needed for headache.     Magnesium 200 MG Tabs  Take 200 mg by mouth daily.     methocarbamol 500 MG tablet  Commonly known as:  ROBAXIN  Take 1 tablet (500 mg total) by mouth 4 (four) times daily.     multivitamin with minerals Tabs tablet  Take 2 tablets by mouth daily. Gummy Vitamin     nadolol 40 MG tablet  Commonly known as:  CORGARD  Take 40 mg by mouth every morning.     promethazine 12.5 MG tablet  Commonly known as:  PHENERGAN  Take 1 tablet (12.5 mg total) by mouth every 6 (six) hours as needed for nausea.     promethazine 12.5 MG tablet  Commonly known as:  PHENERGAN  Take 1 tablet (12.5 mg total) by mouth every 6 (six) hours as needed for nausea or vomiting.     venlafaxine 75 MG tablet  Commonly known as:  EFFEXOR  Take 75 mg by mouth 2 (two)  times daily.     Vitamin B-12 CR 1000 MCG Tbcr  Take by mouth.     VITAMIN D3 PO  Take 2 tablets by mouth daily.         SignedEarleen Newport 04/28/2014, 1:07 PM

## 2014-04-28 NOTE — Discharge Instructions (Signed)
Ganglion Cyst A ganglion cyst is a noncancerous, fluid-filled lump that occurs near joints or tendons. The ganglion cyst grows out of a joint or the lining of a tendon. It most often develops in the hand or wrist but can also develop in the shoulder, elbow, hip, knee, ankle, or foot. The round or oval ganglion can be pea sized or larger than a grape. Increased activity may enlarge the size of the cyst because more fluid starts to build up.  CAUSES  It is not completely known what causes a ganglion cyst to grow. However, it may be related to:  Inflammation or irritation around the joint.  An injury.  Repetitive movements or overuse.  Arthritis. SYMPTOMS  A lump most often appears in the hand or wrist, but can occur in other areas of the body. Generally, the lump is painless without other symptoms. However, sometimes pain can be felt during activity or when pressure is applied to the lump. The lump may even be tender to the touch. Tingling, pain, numbness, or muscle weakness can occur if the ganglion cyst presses on a nerve. Your grip may be weak and you may have less movement in your joints.  DIAGNOSIS  Ganglion cysts are most often diagnosed based on a physical exam, noting where the cyst is and how it looks. Your caregiver will feel the lump and may shine a light alongside it. If it is a ganglion, a light often shines through it. Your caregiver may order an X-ray, ultrasound, or MRI to rule out other conditions. TREATMENT  Ganglions usually go away on their own without treatment. If pain or other symptoms are involved, treatment may be needed. Treatment is also needed if the ganglion limits your movement or if it gets infected. Treatment options include:  Wearing a wrist or finger brace or splint.  Taking anti-inflammatory medicine.  Draining fluid from the lump with a needle (aspiration).  Injecting a steroid into the joint.  Surgery to remove the ganglion cyst and its stalk that is  attached to the joint or tendon. However, ganglion cysts can grow back. HOME CARE INSTRUCTIONS   Do not press on the ganglion, poke it with a needle, or hit it with a heavy object. You may rub the lump gently and often. Sometimes fluid moves out of the cyst.  Only take medicines as directed by your caregiver.  Wear your brace or splint as directed by your caregiver. SEEK MEDICAL CARE IF:   Your ganglion becomes larger or more painful.  You have increased redness, red streaks, or swelling.  You have pus coming from the lump.  You have weakness or numbness in the affected area. MAKE SURE YOU:   Understand these instructions.  Will watch your condition.  Will get help right away if you are not doing well or get worse. Document Released: 12/17/1999 Document Revised: 09/13/2011 Document Reviewed: 02/12/2007 [facility] Patient Information 2015 [place], [place]. This information is not intended to replace advice given to you by your health care provider. Make sure you discuss any questions you have with your health care provider.  Promethazine tablets What is this medicine? PROMETHAZINE (proe METH a zeen) is an antihistamine. It is used to treat allergic reactions and to treat or prevent nausea and vomiting from illness or motion sickness. It is also used to make you sleep before surgery, and to help treat pain or nausea after surgery. This medicine may be used for other purposes; ask your health care provider or pharmacist if  you have questions. COMMON BRAND NAME(S): Phenergan What should I tell my health care provider before I take this medicine? They need to know if you have any of these conditions: -glaucoma -high blood pressure or heart disease -kidney disease -liver disease -lung or breathing disease, like asthma -prostate trouble -pain or difficulty passing urine -seizures -an unusual or allergic reaction to promethazine or phenothiazines, other medicines, foods, dyes, or  preservatives -pregnant or trying to get pregnant -breast-feeding How should I use this medicine? Take this medicine by mouth with a glass of water. Follow the directions on the prescription label. Take your doses at regular intervals. Do not take your medicine more often than directed. Talk to your pediatrician regarding the use of this medicine in children. Special care may be needed. This medicine should not be given to infants and children younger than 65 years old. Overdosage: If you think you have taken too much of this medicine contact a poison control center or emergency room at once. NOTE: This medicine is only for you. Do not share this medicine with others. What if I miss a dose? If you miss a dose, take it as soon as you can. If it is almost time for your next dose, take only that dose. Do not take double or extra doses. What may interact with this medicine? Do not take this medicine with any of the following medications: -cisapride -dofetilide -dronedarone -MAOIs like Carbex, Eldepryl, Marplan, Nardil, Parnate -pimozide -quinidine, including dextromethorphan; quinidine -thioridazine -ziprasidone This medicine may also interact with the following medications: -certain medicines for depression, anxiety, or psychotic disturbances -certain medicines for anxiety or sleep -certain medicines for seizures like carbamazepine, phenobarbital, phenytoin -certain medicines for movement abnormalities as in Parkinson's disease, or for gastrointestinal problems -epinephrine -medicines for allergies or colds -muscle relaxants -narcotic medicines for pain -other medicines that prolong the QT interval (cause an abnormal heart rhythm) -tramadol -trimethobenzamide This list may not describe all possible interactions. Give your health care provider a list of all the medicines, herbs, non-prescription drugs, or dietary supplements you use. Also tell them if you smoke, drink alcohol, or use  illegal drugs. Some items may interact with your medicine. What should I watch for while using this medicine? Tell your doctor or health care professional if your symptoms do not start to get better in 1 to 2 days. You may get drowsy or dizzy. Do not drive, use machinery, or do anything that needs mental alertness until you know how this medicine affects you. To reduce the risk of dizzy or fainting spells, do not stand or sit up quickly, especially if you are an older patient. Alcohol may increase dizziness and drowsiness. Avoid alcoholic drinks. Your mouth may get dry. Chewing sugarless gum or sucking hard candy, and drinking plenty of water may help. Contact your doctor if the problem does not go away or is severe. This medicine may cause dry eyes and blurred vision. If you wear contact lenses you may feel some discomfort. Lubricating drops may help. See your eye doctor if the problem does not go away or is severe. This medicine can make you more sensitive to the sun. Keep out of the sun. If you cannot avoid being in the sun, wear protective clothing and use sunscreen. Do not use sun lamps or tanning beds/booths. If you are diabetic, check your blood-sugar levels regularly. What side effects may I notice from receiving this medicine? Side effects that you should report to your doctor or health care professional  as soon as possible: -blurred vision -irregular heartbeat, palpitations or chest pain -muscle or facial twitches -pain or difficulty passing urine -seizures -skin rash -slowed or shallow breathing -unusual bleeding or bruising -yellowing of the eyes or skin Side effects that usually do not require medical attention (report to your doctor or health care professional if they continue or are bothersome): -headache -nightmares, agitation, nervousness, excitability, not able to sleep (these are more likely in children) -stuffy nose This list may not describe all possible side effects. Call  your doctor for medical advice about side effects. You may report side effects to FDA at 1-800-FDA-1088. Where should I keep my medicine? Keep out of the reach of children. Store at room temperature, between 20 and 25 degrees C (68 and 77 degrees F). Protect from light. Throw away any unused medicine after the expiration date. NOTE: This sheet is a summary. It may not cover all possible information. If you have questions about this medicine, talk to your doctor, pharmacist, or health care provider.  2015, Elsevier/Gold Standard. (2012-08-20 15:04:46)   Acetaminophen; Oxycodone tablets What is this medicine? ACETAMINOPHEN; OXYCODONE (a set a MEE noe fen; ox i KOE done) is a pain reliever. It is used to treat mild to moderate pain. This medicine may be used for other purposes; ask your health care provider or pharmacist if you have questions. COMMON BRAND NAME(S): Endocet, Magnacet, Narvox, Percocet, Perloxx, Primalev, Primlev, Roxicet, Xolox What should I tell my health care provider before I take this medicine? They need to know if you have any of these conditions: -brain tumor -Crohn's disease, inflammatory bowel disease, or ulcerative colitis -drug abuse or addiction -head injury -heart or circulation problems -if you often drink alcohol -kidney disease or problems going to the bathroom -liver disease -lung disease, asthma, or breathing problems -an unusual or allergic reaction to acetaminophen, oxycodone, other opioid analgesics, other medicines, foods, dyes, or preservatives -pregnant or trying to get pregnant -breast-feeding How should I use this medicine? Take this medicine by mouth with a full glass of water. Follow the directions on the prescription label. Take your medicine at regular intervals. Do not take your medicine more often than directed. Talk to your pediatrician regarding the use of this medicine in children. Special care may be needed. Patients over 55 years old may  have a stronger reaction and need a smaller dose. Overdosage: If you think you have taken too much of this medicine contact a poison control center or emergency room at once. NOTE: This medicine is only for you. Do not share this medicine with others. What if I miss a dose? If you miss a dose, take it as soon as you can. If it is almost time for your next dose, take only that dose. Do not take double or extra doses. What may interact with this medicine? -alcohol -antihistamines -barbiturates like amobarbital, butalbital, butabarbital, methohexital, pentobarbital, phenobarbital, thiopental, and secobarbital -benztropine -drugs for bladder problems like solifenacin, trospium, oxybutynin, tolterodine, hyoscyamine, and methscopolamine -drugs for breathing problems like ipratropium and tiotropium -drugs for certain stomach or intestine problems like propantheline, homatropine methylbromide, glycopyrrolate, atropine, belladonna, and dicyclomine -general anesthetics like etomidate, ketamine, nitrous oxide, propofol, desflurane, enflurane, halothane, isoflurane, and sevoflurane -medicines for depression, anxiety, or psychotic disturbances -medicines for sleep -muscle relaxants -naltrexone -narcotic medicines (opiates) for pain -phenothiazines like perphenazine, thioridazine, chlorpromazine, mesoridazine, fluphenazine, prochlorperazine, promazine, and trifluoperazine -scopolamine -tramadol -trihexyphenidyl This list may not describe all possible interactions. Give your health care provider a list of all  the medicines, herbs, non-prescription drugs, or dietary supplements you use. Also tell them if you smoke, drink alcohol, or use illegal drugs. Some items may interact with your medicine. What should I watch for while using this medicine? Tell your doctor or health care professional if your pain does not go away, if it gets worse, or if you have new or a different type of pain. You may develop  tolerance to the medicine. Tolerance means that you will need a higher dose of the medication for pain relief. Tolerance is normal and is expected if you take this medicine for a long time. Do not suddenly stop taking your medicine because you may develop a severe reaction. Your body becomes used to the medicine. This does NOT mean you are addicted. Addiction is a behavior related to getting and using a drug for a non-medical reason. If you have pain, you have a medical reason to take pain medicine. Your doctor will tell you how much medicine to take. If your doctor wants you to stop the medicine, the dose will be slowly lowered over time to avoid any side effects. You may get drowsy or dizzy. Do not drive, use machinery, or do anything that needs mental alertness until you know how this medicine affects you. Do not stand or sit up quickly, especially if you are an older patient. This reduces the risk of dizzy or fainting spells. Alcohol may interfere with the effect of this medicine. Avoid alcoholic drinks. There are different types of narcotic medicines (opiates) for pain. If you take more than one type at the same time, you may have more side effects. Give your health care provider a list of all medicines you use. Your doctor will tell you how much medicine to take. Do not take more medicine than directed. Call emergency for help if you have problems breathing. The medicine will cause constipation. Try to have a bowel movement at least every 2 to 3 days. If you do not have a bowel movement for 3 days, call your doctor or health care professional. Do not take Tylenol (acetaminophen) or medicines that have acetaminophen with this medicine. Too much acetaminophen can be very dangerous. Many nonprescription medicines contain acetaminophen. Always read the labels carefully to avoid taking more acetaminophen. What side effects may I notice from receiving this medicine? Side effects that you should report to your  doctor or health care professional as soon as possible: -allergic reactions like skin rash, itching or hives, swelling of the face, lips, or tongue -breathing difficulties, wheezing -confusion -light headedness or fainting spells -severe stomach pain -unusually weak or tired -yellowing of the skin or the whites of the eyes Side effects that usually do not require medical attention (report to your doctor or health care professional if they continue or are bothersome): -dizziness -drowsiness -nausea -vomiting This list may not describe all possible side effects. Call your doctor for medical advice about side effects. You may report side effects to FDA at 1-800-FDA-1088. Where should I keep my medicine? Keep out of the reach of children. This medicine can be abused. Keep your medicine in a safe place to protect it from theft. Do not share this medicine with anyone. Selling or giving away this medicine is dangerous and against the law. Store at room temperature between 20 and 25 degrees C (68 and 77 degrees F). Keep container tightly closed. Protect from light. This medicine may cause accidental overdose and death if it is taken by other adults, children,  or pets. Flush any unused medicine down the toilet to reduce the chance of harm. Do not use the medicine after the expiration date. NOTE: This sheet is a summary. It may not cover all possible information. If you have questions about this medicine, talk to your doctor, pharmacist, or health care provider.  2015, [employer]vier/Gold Standard. (2012-08-12 13:17:35)  Methocarbamol tablets What is this medicine? METHOCARBAMOL (meth oh KAR ba mole) helps to relieve pain and stiffness in muscles caused by strains, sprains, or other injury to your muscles. This medicine may be used for other purposes; ask your health care provider or pharmacist if you have questions. COMMON BRAND NAME(S): Robaxin What should I tell my health care provider before I take  this medicine? They need to know if you have any of these conditions: -kidney disease -seizures -an unusual or allergic reaction to methocarbamol, other medicines, foods, dyes, or preservatives -pregnant or trying to get pregnant -breast-feeding How should I use this medicine? Take this medicine by mouth with a full glass of water. Follow the directions on the prescription label. Take your medicine at regular intervals. Do not take your medicine more often than directed. Talk to your pediatrician regarding the use of this medicine in children. Special care may be needed. Overdosage: If you think you have taken too much of this medicine contact a poison control center or emergency room at once. NOTE: This medicine is only for you. Do not share this medicine with others. What if I miss a dose? If you miss a dose, take it as soon as you can. If it is almost time for your next dose, take only the next dose. Do not take double or extra doses. What may interact with this medicine? -alcohol or medicines that contain alcohol -cholinesterase inhibitors like neostigmine, ambenonium, and pyridostigmine bromide -other medicines that cause drowsiness This list may not describe all possible interactions. Give your health care provider a list of all the medicines, herbs, non-prescription drugs, or dietary supplements you use. Also tell them if you smoke, drink alcohol, or use illegal drugs. Some items may interact with your medicine. What should I watch for while using this medicine? You may get drowsy or dizzy. Do not drive, use machinery, or do anything that needs mental alertness until you know how this medicine affects you. Do not stand or sit up quickly, especially if you are an older patient. This reduces the risk of dizzy or fainting spells. Alcohol may interfere with the effect of this medicine. Avoid alcoholic drinks. What side effects may I notice from receiving this medicine? Side effects that you  should report to your doctor or health care professional as soon as possible: -allergic reactions like skin rash, itching or hives, swelling of the face, lips, or tongue -blurred vision or changes in vision -confusion -fainting spells -fever -nausea or vomiting -seizures Side effects that usually do not require medical attention (report to your doctor or health care professional if they continue or are bothersome): -dizziness -drowsiness -headache -metallic taste This list may not describe all possible side effects. Call your doctor for medical advice about side effects. You may report side effects to FDA at 1-800-FDA-1088. Where should I keep my medicine? Keep out of the reach of children. Store at room temperature between 20 and 25 degrees C (68 and 77 degrees F). Keep container tightly closed. Throw away any unused medicine after the expiration date. NOTE: This sheet is a summary. It may not cover all possible information. If  you have questions about this medicine, talk to your doctor, pharmacist, or health care provider.  2015, Elsevier/Gold Standard. (2007-07-08 16:02:03)

## 2014-06-29 ENCOUNTER — Other Ambulatory Visit: Payer: Self-pay

## 2014-07-06 DIAGNOSIS — Z23 Encounter for immunization: Secondary | ICD-10-CM | POA: Diagnosis not present

## 2014-08-06 DIAGNOSIS — M4726 Other spondylosis with radiculopathy, lumbar region: Secondary | ICD-10-CM | POA: Diagnosis not present

## 2014-11-09 DIAGNOSIS — Z23 Encounter for immunization: Secondary | ICD-10-CM | POA: Diagnosis not present

## 2014-11-09 DIAGNOSIS — I519 Heart disease, unspecified: Secondary | ICD-10-CM | POA: Diagnosis not present

## 2014-11-09 DIAGNOSIS — G43109 Migraine with aura, not intractable, without status migrainosus: Secondary | ICD-10-CM | POA: Diagnosis not present

## 2014-11-09 DIAGNOSIS — J301 Allergic rhinitis due to pollen: Secondary | ICD-10-CM | POA: Diagnosis not present

## 2014-11-09 DIAGNOSIS — F322 Major depressive disorder, single episode, severe without psychotic features: Secondary | ICD-10-CM | POA: Diagnosis not present

## 2014-11-09 DIAGNOSIS — I110 Hypertensive heart disease with heart failure: Secondary | ICD-10-CM | POA: Diagnosis not present

## 2014-11-09 DIAGNOSIS — E7800 Pure hypercholesterolemia, unspecified: Secondary | ICD-10-CM | POA: Diagnosis not present

## 2014-11-09 DIAGNOSIS — K589 Irritable bowel syndrome without diarrhea: Secondary | ICD-10-CM | POA: Diagnosis not present

## 2014-11-09 DIAGNOSIS — Z0000 Encounter for general adult medical examination without abnormal findings: Secondary | ICD-10-CM | POA: Diagnosis not present

## 2014-11-09 DIAGNOSIS — M810 Age-related osteoporosis without current pathological fracture: Secondary | ICD-10-CM | POA: Diagnosis not present

## 2014-11-09 DIAGNOSIS — F411 Generalized anxiety disorder: Secondary | ICD-10-CM | POA: Diagnosis not present

## 2014-11-09 DIAGNOSIS — K219 Gastro-esophageal reflux disease without esophagitis: Secondary | ICD-10-CM | POA: Diagnosis not present

## 2014-12-07 DIAGNOSIS — H2513 Age-related nuclear cataract, bilateral: Secondary | ICD-10-CM | POA: Diagnosis not present

## 2014-12-21 DIAGNOSIS — M545 Low back pain: Secondary | ICD-10-CM | POA: Diagnosis not present

## 2014-12-21 DIAGNOSIS — M5416 Radiculopathy, lumbar region: Secondary | ICD-10-CM | POA: Diagnosis not present

## 2014-12-21 DIAGNOSIS — M4726 Other spondylosis with radiculopathy, lumbar region: Secondary | ICD-10-CM | POA: Diagnosis not present

## 2015-01-08 DIAGNOSIS — R350 Frequency of micturition: Secondary | ICD-10-CM | POA: Diagnosis not present

## 2015-06-04 DIAGNOSIS — E7800 Pure hypercholesterolemia, unspecified: Secondary | ICD-10-CM | POA: Diagnosis not present

## 2015-06-04 DIAGNOSIS — I110 Hypertensive heart disease with heart failure: Secondary | ICD-10-CM | POA: Diagnosis not present

## 2015-06-04 DIAGNOSIS — F411 Generalized anxiety disorder: Secondary | ICD-10-CM | POA: Diagnosis not present

## 2015-09-15 DIAGNOSIS — M5416 Radiculopathy, lumbar region: Secondary | ICD-10-CM | POA: Diagnosis not present

## 2015-09-15 DIAGNOSIS — M4726 Other spondylosis with radiculopathy, lumbar region: Secondary | ICD-10-CM | POA: Diagnosis not present

## 2015-09-21 DIAGNOSIS — I10 Essential (primary) hypertension: Secondary | ICD-10-CM | POA: Diagnosis not present

## 2015-09-23 DIAGNOSIS — M5126 Other intervertebral disc displacement, lumbar region: Secondary | ICD-10-CM | POA: Diagnosis not present

## 2015-09-23 DIAGNOSIS — M5416 Radiculopathy, lumbar region: Secondary | ICD-10-CM | POA: Diagnosis not present

## 2015-09-29 DIAGNOSIS — M5416 Radiculopathy, lumbar region: Secondary | ICD-10-CM | POA: Diagnosis not present

## 2015-12-20 DIAGNOSIS — E7800 Pure hypercholesterolemia, unspecified: Secondary | ICD-10-CM | POA: Diagnosis not present

## 2015-12-20 DIAGNOSIS — F322 Major depressive disorder, single episode, severe without psychotic features: Secondary | ICD-10-CM | POA: Diagnosis not present

## 2015-12-20 DIAGNOSIS — M810 Age-related osteoporosis without current pathological fracture: Secondary | ICD-10-CM | POA: Diagnosis not present

## 2015-12-20 DIAGNOSIS — I671 Cerebral aneurysm, nonruptured: Secondary | ICD-10-CM | POA: Diagnosis not present

## 2015-12-20 DIAGNOSIS — K219 Gastro-esophageal reflux disease without esophagitis: Secondary | ICD-10-CM | POA: Diagnosis not present

## 2015-12-20 DIAGNOSIS — Z0000 Encounter for general adult medical examination without abnormal findings: Secondary | ICD-10-CM | POA: Diagnosis not present

## 2015-12-20 DIAGNOSIS — F411 Generalized anxiety disorder: Secondary | ICD-10-CM | POA: Diagnosis not present

## 2015-12-20 DIAGNOSIS — N959 Unspecified menopausal and perimenopausal disorder: Secondary | ICD-10-CM | POA: Diagnosis not present

## 2015-12-20 DIAGNOSIS — I110 Hypertensive heart disease with heart failure: Secondary | ICD-10-CM | POA: Diagnosis not present

## 2015-12-20 DIAGNOSIS — I519 Heart disease, unspecified: Secondary | ICD-10-CM | POA: Diagnosis not present

## 2015-12-20 DIAGNOSIS — J301 Allergic rhinitis due to pollen: Secondary | ICD-10-CM | POA: Diagnosis not present

## 2015-12-20 DIAGNOSIS — G43109 Migraine with aura, not intractable, without status migrainosus: Secondary | ICD-10-CM | POA: Diagnosis not present

## 2016-01-03 HISTORY — PX: CHOLECYSTECTOMY: SHX55

## 2016-02-04 DIAGNOSIS — Z1231 Encounter for screening mammogram for malignant neoplasm of breast: Secondary | ICD-10-CM | POA: Diagnosis not present

## 2016-02-04 DIAGNOSIS — Z780 Asymptomatic menopausal state: Secondary | ICD-10-CM | POA: Diagnosis not present

## 2016-02-04 DIAGNOSIS — M8588 Other specified disorders of bone density and structure, other site: Secondary | ICD-10-CM | POA: Diagnosis not present

## 2016-02-04 DIAGNOSIS — M810 Age-related osteoporosis without current pathological fracture: Secondary | ICD-10-CM | POA: Diagnosis not present

## 2016-02-04 DIAGNOSIS — M85852 Other specified disorders of bone density and structure, left thigh: Secondary | ICD-10-CM | POA: Diagnosis not present

## 2016-04-05 DIAGNOSIS — F513 Sleepwalking [somnambulism]: Secondary | ICD-10-CM | POA: Diagnosis not present

## 2016-04-05 DIAGNOSIS — D2362 Other benign neoplasm of skin of left upper limb, including shoulder: Secondary | ICD-10-CM | POA: Diagnosis not present

## 2016-04-14 DIAGNOSIS — L821 Other seborrheic keratosis: Secondary | ICD-10-CM | POA: Diagnosis not present

## 2016-04-14 DIAGNOSIS — D485 Neoplasm of uncertain behavior of skin: Secondary | ICD-10-CM | POA: Diagnosis not present

## 2016-05-18 DIAGNOSIS — L905 Scar conditions and fibrosis of skin: Secondary | ICD-10-CM | POA: Diagnosis not present

## 2016-06-15 ENCOUNTER — Emergency Department
Admission: EM | Admit: 2016-06-15 | Discharge: 2016-06-16 | Disposition: A | Discharge status: Other Acute Inpatient Hospital | Payer: PPO | Attending: Emergency Medicine | Admitting: Emergency Medicine

## 2016-06-15 ENCOUNTER — Emergency Department: Payer: PPO

## 2016-06-15 DIAGNOSIS — Y929 Unspecified place or not applicable: Secondary | ICD-10-CM | POA: Insufficient documentation

## 2016-06-15 DIAGNOSIS — T1491XA Suicide attempt, initial encounter: Secondary | ICD-10-CM

## 2016-06-15 DIAGNOSIS — Y939 Activity, unspecified: Secondary | ICD-10-CM | POA: Diagnosis not present

## 2016-06-15 DIAGNOSIS — F332 Major depressive disorder, recurrent severe without psychotic features: Secondary | ICD-10-CM | POA: Diagnosis not present

## 2016-06-15 DIAGNOSIS — I10 Essential (primary) hypertension: Secondary | ICD-10-CM | POA: Diagnosis not present

## 2016-06-15 DIAGNOSIS — Y33XXXA Other specified events, undetermined intent, initial encounter: Secondary | ICD-10-CM | POA: Insufficient documentation

## 2016-06-15 DIAGNOSIS — F329 Major depressive disorder, single episode, unspecified: Secondary | ICD-10-CM | POA: Diagnosis not present

## 2016-06-15 DIAGNOSIS — T424X1A Poisoning by benzodiazepines, accidental (unintentional), initial encounter: Secondary | ICD-10-CM

## 2016-06-15 DIAGNOSIS — Z79899 Other long term (current) drug therapy: Secondary | ICD-10-CM | POA: Diagnosis not present

## 2016-06-15 DIAGNOSIS — Y999 Unspecified external cause status: Secondary | ICD-10-CM | POA: Diagnosis not present

## 2016-06-15 DIAGNOSIS — Z7982 Long term (current) use of aspirin: Secondary | ICD-10-CM | POA: Insufficient documentation

## 2016-06-15 DIAGNOSIS — F1010 Alcohol abuse, uncomplicated: Secondary | ICD-10-CM | POA: Diagnosis not present

## 2016-06-15 DIAGNOSIS — F32A Depression, unspecified: Secondary | ICD-10-CM

## 2016-06-15 DIAGNOSIS — S0990XA Unspecified injury of head, initial encounter: Secondary | ICD-10-CM | POA: Diagnosis not present

## 2016-06-15 DIAGNOSIS — F10220 Alcohol dependence with intoxication, uncomplicated: Secondary | ICD-10-CM | POA: Diagnosis not present

## 2016-06-15 DIAGNOSIS — Z91040 Latex allergy status: Secondary | ICD-10-CM | POA: Insufficient documentation

## 2016-06-15 DIAGNOSIS — Y901 Blood alcohol level of 20-39 mg/100 ml: Secondary | ICD-10-CM | POA: Diagnosis not present

## 2016-06-15 DIAGNOSIS — T50904A Poisoning by unspecified drugs, medicaments and biological substances, undetermined, initial encounter: Secondary | ICD-10-CM | POA: Diagnosis not present

## 2016-06-15 DIAGNOSIS — R45851 Suicidal ideations: Secondary | ICD-10-CM | POA: Diagnosis not present

## 2016-06-15 DIAGNOSIS — F3342 Major depressive disorder, recurrent, in full remission: Secondary | ICD-10-CM

## 2016-06-15 LAB — COMPREHENSIVE METABOLIC PANEL
ALK PHOS: 71 U/L (ref 38–126)
ALT: 29 U/L (ref 14–54)
ANION GAP: 7 (ref 5–15)
AST: 31 U/L (ref 15–41)
Albumin: 4.4 g/dL (ref 3.5–5.0)
BILIRUBIN TOTAL: 0.7 mg/dL (ref 0.3–1.2)
BUN: 19 mg/dL (ref 6–20)
CO2: 26 mmol/L (ref 22–32)
Calcium: 9.4 mg/dL (ref 8.9–10.3)
Chloride: 106 mmol/L (ref 101–111)
Creatinine, Ser: 0.62 mg/dL (ref 0.44–1.00)
GFR calc Af Amer: >60 mL/min (ref >60)
GFR calc non Af Amer: >60 mL/min (ref >60)
Glucose, Bld: 87 mg/dL (ref 65–99)
POTASSIUM: 3.3 mmol/L — AB (ref 3.5–5.1)
Sodium: 139 mmol/L (ref 135–145)
TOTAL PROTEIN: 7.3 g/dL (ref 6.5–8.1)

## 2016-06-15 LAB — CBC
HCT: 40.9 % (ref 35.0–47.0)
HEMOGLOBIN: 13.7 g/dL (ref 12.0–16.0)
MCH: 31.1 pg (ref 26.0–34.0)
MCHC: 33.5 g/dL (ref 32.0–36.0)
MCV: 92.9 fL (ref 80.0–100.0)
Platelets: 228 10*3/uL (ref 150–440)
RBC: 4.4 MIL/uL (ref 3.80–5.20)
RDW: 12.6 % (ref 11.5–14.5)
WBC: 6.5 10*3/uL (ref 3.6–11.0)

## 2016-06-15 LAB — URINE DRUG SCREEN, QUALITATIVE (ARMC ONLY)
AMPHETAMINES, UR SCREEN: NOT DETECTED
Barbiturates, Ur Screen: NOT DETECTED
Benzodiazepine, Ur Scrn: POSITIVE — AB
COCAINE METABOLITE, UR ~~LOC~~: NOT DETECTED
Cannabinoid 50 Ng, Ur ~~LOC~~: NOT DETECTED
MDMA (ECSTASY) UR SCREEN: NOT DETECTED
METHADONE SCREEN, URINE: NOT DETECTED
OPIATE, UR SCREEN: NOT DETECTED
PHENCYCLIDINE (PCP) UR S: NOT DETECTED
Tricyclic, Ur Screen: NOT DETECTED

## 2016-06-15 LAB — ACETAMINOPHEN LEVEL

## 2016-06-15 LAB — ETHANOL: Alcohol, Ethyl (B): 38 mg/dL — ABNORMAL HIGH (ref <5)

## 2016-06-15 LAB — SALICYLATE LEVEL: Salicylate Lvl: <7 mg/dL (ref 2.8–30.0)

## 2016-06-15 MED ORDER — LORAZEPAM 0.5 MG PO TABS
ORAL_TABLET | ORAL | Status: AC
  Filled 2016-06-15: qty 1

## 2016-06-15 MED ORDER — VENLAFAXINE HCL 37.5 MG PO TABS
75.0000 mg | ORAL_TABLET | Freq: Two times a day (BID) | ORAL | Status: DC
  Administered 2016-06-16: 75 mg via ORAL
  Filled 2016-06-15 (×2): qty 1

## 2016-06-15 MED ORDER — PANTOPRAZOLE SODIUM 40 MG PO TBEC
40.0000 mg | DELAYED_RELEASE_TABLET | Freq: Every day | ORAL | Status: DC
  Administered 2016-06-16: 40 mg via ORAL
  Filled 2016-06-15 (×2): qty 1

## 2016-06-15 MED ORDER — AMLODIPINE BESYLATE 5 MG PO TABS
5.0000 mg | ORAL_TABLET | Freq: Every day | ORAL | Status: DC
  Administered 2016-06-16: 5 mg via ORAL
  Filled 2016-06-15 (×2): qty 1

## 2016-06-15 NOTE — ED Notes (Signed)
BE[facility]IORAL HE[facility] ROUNDING Patient sleeping: No. Patient alert and oriented: yes Behavior appropriate: Yes.  ; If no, describe:  Nutrition and fluids offered: yes Toileting and hygiene offered: Yes  Sitter present: q15 minute observations and security  monitoring Law enforcement present: Yes  ODS  

## 2016-06-15 NOTE — ED Notes (Signed)
Dr Weber Cooks with pt

## 2016-06-15 NOTE — Consult Note (Signed)
[facility] Consult   Reason for Consult:  Consult for 71 year old woman who came into the hospital after a suicide attempt Referring Physician:  Darl Householder Patient Identification: Amber Brooks MRN:  [id number] Principal Diagnosis: Severe recurrent major depression without psychotic features [facility]) Diagnosis:   Patient Active Problem List   Diagnosis Date Noted  . Suicide attempt ([place]) [T14.91XA] 06/15/2016  . Benzodiazepine overdose [T42.4X1A] 06/15/2016  . Severe recurrent major depression without psychotic features ([place]) [F33.2] 06/15/2016  . Lumbar radiculopathy [M54.16] 04/26/2014  . Chest pain [R07.9] 04/06/2014  . Hypertension [I10]   . Mitral valve prolapse [I34.1]   . GERD (gastroesophageal reflux disease) [K21.9]   . Anxiety [F41.9]   . Depression [F32.9]   . Gastroesophageal reflux disease with esophagitis [K21.0]   . Memory loss of unknown cause [R41.3] 12/03/2013  . Ataxia [R27.0] 12/03/2013  . 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 female patient admitted with "I tried to commit suicide".  HPI:  Patient interviewed chart reviewed. 72 year old woman brought to the hospital after taking an overdose of Xanax today. Patient says that she's been depressed for a long time at least since 2013. Her mood symptoms, and go but they've been worse recently. She reports that she will get depressed sometimes for no reason at all. Recently she has been feeling down much of the time. She sleeps okay but her appetite is poor and she has been losing weight. She says that today she decided to kill her self by overdosing on Xanax. Acute stressors include that a relative of hers, apparently the father of her niece, died of cancer. Also that the patient's boyfriend yelled at her while drinking. Patient denies that she was consuming any alcohol although there is alcohol in her urine and  blood when she is seen here in the emergency room.  Social history: Patient has a boyfriend. I'm not certain if they lived together completely. Sounds like possibly they do from what she described. She works as a Art therapist.  Medical history: History of hypertension. She has a history of a brain aneurysm in the past. She says it has caused her to have migraines but doesn't report another specific neurologic problem.  Substance abuse history: Patient denies alcohol abuse denies any other drug abuse. She says that she uses her Xanax nightly as prescribed and drinks 1 glass of wine every day as prescribed by her doctor but doesn't think it is abusive  Past Psychiatric History: She says that she's been treated for depression in the past. She takes Effexor 75 mg twice a day. No previous hospitalizations. No previous suicide attempts. Not seeing a psychiatrist or mental health provider. No history reported of psychosis or bipolar disorder  Risk to Self: Is patient at risk for suicide?: Yes Risk to Others:   Prior Inpatient Therapy:   Prior Outpatient Therapy:    Past Medical History:  Past Medical History:  Diagnosis Date  . Anxiety   . 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 Brain aneurysm   . Complication of anesthesia   . Depression   . GERD (gastroesophageal reflux disease)   . Headache(784.0)    migraines  . Hypertension   . Mitral valve prolapse   . PONV (postoperative nausea and vomiting)     Past Surgical History:  Procedure Laterality Date  . ABDOMINAL HYSTERECTOMY  1987  . ANEURYSM COILING    .  CRANIOTOMY Left 11/15/2012   Procedure: CRANIOTOMY INTRACRANIAL ANEURYSM FOR CAROTID;  Surgeon: Winfield Cunas, MD;  Location: [place] NEURO ORS;  Service: Neurosurgery;  Laterality: Left;  LEFT Craniotomy for Aneurysm  . ECTOPIC PREGNANCY SURGERY    . LUMBAR LAMINECTOMY/DECOMPRESSION MICRODISCECTOMY N/A 04/27/2014   Procedure: LUMBAR LAMINECTOMY/DECOMPRESSION MICRODISCECTOMY LUMBAR FOUR-FIVE ;  Surgeon:  Kristeen Miss, MD;  Location: [place] NEUR[facility] ORS;  Service: Neurosurgery;  Laterality: N/A;  . RADIOLOGY WITH ANESTHESIA N/A 10/09/2012   Procedure: RADIOLOGY WITH ANESTHESIA;  Surgeon: Rob Hickman, MD;  Location: [place];  Service: Radiology;  Laterality: N/A;  . TONSILLECTOMY    . VASCULAR SURGERY     stent placed   Family History:  Family History  Problem Relation Age of Onset  . Dementia Father   . Dementia Brother    Family Psychiatric  History: Denies knowing of any Social History:  History  Alcohol Use  . 0.0 oz/week  . 2 - 3 Glasses of wine per week     History  Drug Use No    Social History   Social History  . Marital status: Divorced    Spouse name: N/A  . Number of children: N/A  . Years of education: N/A   Social History Main Topics  . Smoking status: Never Smoker  . Smokeless tobacco: Not on file  . Alcohol use 0.0 oz/week    2 - 3 Glasses of wine per week  . Drug use: No  . Sexual activity: Not Currently   Other Topics Concern  . Not on file   Social History Narrative   Right handed.  Caffeine decaf 1 cup in am.  Divorced.  1 adopted daughter.  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    Additional Social History:    Allergies:   Allergies  Allergen Reactions  . Latex Anaphylaxis  . Codeine Nausea Only  . Hydromorphone Itching    Labs:  Results for orders placed or performed during the hospital encounter of 06/15/16 (from the past 48 hour(s))  Comprehensive metabolic panel     Status: Abnormal   Collection Time: 06/15/16  5:11 PM  Result Value Ref Range   Sodium 139 135 - 145 mmol/L   Potassium 3.3 (L) 3.5 - 5.1 mmol/L   Chloride 106 101 - 111 mmol/L   CO2 26 22 - 32 mmol/L   Glucose, Bld 87 65 - 99 mg/dL   BUN 19 6 - 20 mg/dL   Creatinine, Ser 0.62 0.44 - 1.00 mg/dL   Calcium 9.4 8.9 - 10.3 mg/dL   Total Protein 7.3 6.5 - 8.1 g/dL   Albumin 4.4 3.5 - 5.0 g/dL   AST 31 15 - 41 U/L   ALT 29 14 - 54 U/L   Alkaline Phosphatase 71 38 - 126 U/L   Total  Bilirubin 0.7 0.3 - 1.2 mg/dL   GFR calc non Af Amer >60 >60 mL/min   GFR calc Af Amer >60 >60 mL/min    Comment: (NOTE) The eGFR has been calculated using the CKD EPI equation. This calculation has not been validated in all clinical situations. eGFR's persistently <60 mL/min signify possible Chronic Kidney Disease.    Anion gap 7 5 - 15  Ethanol     Status: Abnormal   Collection Time: 06/15/16  5:11 PM  Result Value Ref Range   Alcohol, Ethyl (B) 38 (H) <5 mg/dL    Comment:        LOWEST DETECTABLE LIMIT FOR SERUM ALCOHOL IS 5 mg/dL  FOR MEDICAL PURPOSES ONLY   Salicylate level     Status: None   Collection Time: 06/15/16  5:11 PM  Result Value Ref Range   Salicylate Lvl <1.6 2.8 - 30.0 mg/dL  Acetaminophen level     Status: Abnormal   Collection Time: 06/15/16  5:11 PM  Result Value Ref Range   Acetaminophen (Tylenol), Serum <10 (L) 10 - 30 ug/mL    Comment:        THERAPEUTIC CONCENTRATIONS VARY SIGNIFICANTLY. A RANGE OF 10-30 ug/mL MAY BE AN EFFECTIVE CONCENTRATION FOR MANY PATIENTS. HOWEVER, SOME ARE BEST TREATED AT CONCENTRATIONS OUTSIDE THIS RANGE. ACETAMINOPHEN CONCENTRATIONS >150 ug/mL AT 4 HOURS AFTER INGESTION AND >50 ug/mL AT 12 HOURS AFTER INGESTION ARE OFTEN ASSOCIATED WITH TOXIC REACTIONS.   cbc     Status: None   Collection Time: 06/15/16  5:11 PM  Result Value Ref Range   WBC 6.5 3.6 - 11.0 K/uL   RBC 4.40 3.80 - 5.20 MIL/uL   Hemoglobin 13.7 12.0 - 16.0 g/dL   HCT 40.9 35.0 - 47.0 %   MCV 92.9 80.0 - 100.0 fL   MCH 31.1 26.0 - 34.0 pg   MCHC 33.5 32.0 - 36.0 g/dL   RDW 12.6 11.5 - 14.5 %   Platelets 228 150 - 440 K/uL  Urine Drug Screen, Qualitative     Status: Abnormal   Collection Time: 06/15/16  5:11 PM  Result Value Ref Range   Tricyclic, Ur Screen NONE DETECTED NONE DETECTED   Amphetamines, Ur Screen NONE DETECTED NONE DETECTED   MDMA (Ecstasy)Ur Screen NONE DETECTED NONE DETECTED   Cocaine Metabolite,Ur [place] NONE DETECTED NONE DETECTED    Opiate, Ur Screen NONE DETECTED NONE DETECTED   Phencyclidine (PCP) Ur S NONE DETECTED NONE DETECTED   Cannabinoid 50 Ng, Ur [place] NONE DETECTED NONE DETECTED   Barbiturates, Ur Screen NONE DETECTED NONE DETECTED   Benzodiazepine, Ur Scrn POSITIVE (A) NONE DETECTED   Methadone Scn, Ur NONE DETECTED NONE DETECTED    Comment: (NOTE) 384  Tricyclics, urine               Cutoff 1000 ng/mL 200  Amphetamines, urine             Cutoff 1000 ng/mL 300  MDMA (Ecstasy), urine           Cutoff 500 ng/mL 400  Cocaine Metabolite, urine       Cutoff 300 ng/mL 500  Opiate, urine                   Cutoff 300 ng/mL 600  Phencyclidine (PCP), urine      Cutoff 25 ng/mL 700  Cannabinoid, urine              Cutoff 50 ng/mL 800  Barbiturates, urine             Cutoff 200 ng/mL 900  Benzodiazepine, urine           Cutoff 200 ng/mL 1000 Methadone, urine                Cutoff 300 ng/mL 1100 1200 The urine drug screen provides only a preliminary, unconfirmed 1300 analytical test result and should not be used for non-medical 1400 purposes. Clinical consideration and professional judgment should 1500 be applied to any positive drug screen result due to possible 1600 interfering substances. A more specific alternate chemical method 1700 must be used in order to obtain a confirmed analytical result.  1800 Gas  chromato graphy / mass spectrometry (GC/MS) is the preferred 1900 confirmatory method.     Current Facility-Administered Medications  Medication Dose Route Frequency Provider Last Rate Last Dose  . LORazepam (ATIVAN) 0.5 MG tablet            Current Outpatient Prescriptions  Medication Sig Dispense Refill  . ALPRAZolam (XANAX) 0.5 MG tablet Take 0.5 mg by mouth at bedtime as needed for sleep or anxiety.    Marland Kitchen amLODipine (NORVASC) 10 MG tablet Take 10 mg by mouth daily.    Marland Kitchen aspirin 81 MG chewable tablet Chew 81 mg by mouth daily.    . Biotin 5000 MCG CAPS Take 3 capsules by mouth daily.    .  Cholecalciferol (VITAMIN D3 PO) Take 2 tablets by mouth daily.     . Cyanocobalamin (VITAMIN B-12 CR) 1000 MCG TBCR Take by mouth.    . diphenhydrAMINE (BENADRYL) 25 MG tablet Take 25 mg by mouth daily as needed for allergies.    Marland Kitchen esomeprazole (NEXIUM) 40 MG capsule Take 40 mg by mouth daily before breakfast.    . estradiol (ESTRACE) 1 MG tablet Take 0.5 mg by mouth every other day.    . ezetimibe-simvastatin (VYTORIN) 10-40 MG per tablet Take 1 tablet by mouth at bedtime.     Marland Kitchen HYDROcodone-acetaminophen (NORCO) 5-325 MG per tablet Take 1 tablet by mouth every 6 (six) hours as needed for moderate pain. 30 tablet 0  . hyoscyamine (LEVSIN, ANASPAZ) 0.125 MG tablet Take 0.125 mg by mouth every 4 (four) hours as needed for bladder spasms or cramping.    Marland Kitchen ibuprofen (ADVIL,MOTRIN) 800 MG tablet Take 800 mg by mouth every 8 (eight) hours as needed for headache.    . Magnesium 200 MG TABS Take 200 mg by mouth daily.     . methocarbamol (ROBAXIN) 500 MG tablet Take 1 tablet (500 mg total) by mouth 4 (four) times daily. 30 tablet 2  . Multiple Vitamin (MULTIVITAMIN WITH MINERALS) TABS tablet Take 2 tablets by mouth daily. Gummy Vitamin    . nadolol (CORGARD) 40 MG tablet Take 40 mg by mouth every morning.     . promethazine (PHENERGAN) 12.5 MG tablet Take 1 tablet (12.5 mg total) by mouth every 6 (six) hours as needed for nausea. 30 tablet 0  . promethazine (PHENERGAN) 12.5 MG tablet Take 1 tablet (12.5 mg total) by mouth every 6 (six) hours as needed for nausea or vomiting. 30 tablet 0  . venlafaxine (EFFEXOR) 75 MG tablet Take 75 mg by mouth 2 (two) times daily.      Musculoskeletal: Strength & Muscle Tone: decreased Gait & Station: ataxic Patient leans: N/A  Psychiatric Specialty Exam: Physical Exam  Nursing note and vitals reviewed. Constitutional: She appears well-developed and well-nourished.  HENT:  Head: Normocephalic and atraumatic.  Eyes: Conjunctivae are normal. Pupils are equal,  round, and reactive to light.  Neck: Normal range of motion.  Cardiovascular: Regular rhythm and normal heart sounds.   Respiratory: Effort normal. No respiratory distress.  GI: Soft.  Musculoskeletal: Normal range of motion.  Neurological: She is alert.  Skin: Skin is warm and dry.  Psychiatric: Her affect is blunt. Her speech is delayed. She is slowed and withdrawn. Cognition and memory are impaired. She expresses impulsivity. She exhibits a depressed mood. She expresses suicidal ideation. She exhibits abnormal recent memory.    Review of Systems  HENT: Negative.   Eyes: Negative.   Respiratory: Negative.   Cardiovascular: Negative.   Gastrointestinal: Negative.  Musculoskeletal: Negative.   Skin: Negative.   Neurological: Positive for weakness and headaches.  Psychiatric/Behavioral: Positive for depression, memory loss and suicidal ideas. Negative for hallucinations and substance abuse. The patient is nervous/anxious and has insomnia.     Blood pressure (!) 125/56, pulse 68, temperature 97.6 F (36.4 C), temperature source Oral, resp. rate 17, height 5' 2"  (1.575 m), weight 51.7 kg (114 lb), SpO2 97 %.Body mass index is 20.85 kg/m.  General Appearance: Casual  Eye Contact:  Minimal  Speech:  Slow and Slurred  Volume:  Decreased  Mood:  Depressed and Dysphoric  Affect:  Constricted  Thought Process:  Disorganized  Orientation:  Full (Time, Place, and Person)  Thought Content:  Rumination and Tangential  Suicidal Thoughts:  Yes.  with intent/plan  Homicidal Thoughts:  No  Memory:  Immediate;   Good Recent;   Poor Remote;   Fair  Judgement:  Impaired  Insight:  Shallow  Psychomotor Activity:  Decreased  Concentration:  Concentration: Fair  Recall:  [employer] of Knowledge:  Fair  Language:  Fair  Akathisia:  No  Handed:  Right  AIMS (if indicated):     Assets:  Desire for Improvement Housing Resilience  ADL's:  Intact  Cognition:  Impaired,  Mild  Sleep:         Treatment Plan Summary: Daily contact with patient to assess and evaluate symptoms and progress in treatment, Medication management and Plan 71 year old woman who made a suicide attempt today complete with a note. Patient continues to talk about suicidality and depression. Continues to have stressors outside the hospital. Right now she appears to be sedated probably from the Xanax. Patient is not safe for discharge. I completed the involuntary commitment. Recommend that she be referred to geriatric psychiatry and lesser health improvements to where we can demonstrate safe ambulation. With TTS and emergency room physician. Medications ordered.  Disposition: Recommend psychiatric Inpatient admission when medically cleared. Supportive therapy provided about ongoing stressors.  Alethia Berthold, MD 06/15/2016 7:53 PM

## 2016-06-15 NOTE — ED Notes (Signed)
Report was received from Eduard Roux., RN; Pt. Verbalizes  complaints of having Depression; verbalizes having S.I.; with having taken an overdose of Xanax and alcohol; denies having Hi. Continue to monitor with 15 min. Monitoring.

## 2016-06-15 NOTE — ED Notes (Signed)
Pt asked to have daughter call boss to inform of absence at work. This RN spoke with daughter as daughter requested and daughter confirms will contact pts boss.   This RN also discussed IVC paperwork and Dr Weber Cooks recommendations for inpatient admission and awaiting bed placement.

## 2016-06-15 NOTE — ED Notes (Signed)
Report to Joycelyn Schmid, [employer]. Move to room 1

## 2016-06-15 NOTE — ED Notes (Signed)
Routine admitting EKG placed on chart by this RN to be reviewed by cardiology when available.

## 2016-06-15 NOTE — ED Notes (Signed)

## 2016-06-15 NOTE — BH Assessment (Signed)
Assessment Note  Amber Brooks is an 71 y.o. female presenting to the ED, under IVC, after taking an overdose of Xanax. Patient reports increased symptoms of depression and severe mood swings.  She reports a loss of appetite and insomnia.  She says she feels sad over the recent death of her niece's father.   She also reports ongoing conflict with her boyfriend over her alcohol use.  Pt denies alcohol abuse and says that she only has a glass of wine a day.  She reports she became despondent and decided to overdose on Xanax.  Pt denies HI and any auditory/visual hallucinations.  She denies any previous psychiatric hospitalizations or treatment.  Diagnosis: Major Depressive disorder  Past Medical History:  Past Medical History:  Diagnosis Date  . Anxiety   . 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 Brain aneurysm   . Complication of anesthesia   . Depression   . GERD (gastroesophageal reflux disease)   . Headache(784.0)    migraines  . Hypertension   . Mitral valve prolapse   . PONV (postoperative nausea and vomiting)     Past Surgical History:  Procedure Laterality Date  . ABDOMINAL HYSTERECTOMY  1987  . ANEURYSM COILING    . CRANIOTOMY Left 11/15/2012   Procedure: CRANIOTOMY INTRACRANIAL ANEURYSM FOR CAROTID;  Surgeon: Winfield Cunas, MD;  Location: [place] NEUR[facility] ORS;  Service: Neurosurgery;  Laterality: Left;  LEFT Craniotomy for Aneurysm  . ECTOPIC PREGNANCY SURGERY    . LUMBAR LAMINECTOMY/DECOMPRESSION MICRODISCECTOMY N/A 04/27/2014   Procedure: LUMBAR LAMINECTOMY/DECOMPRESSION MICRODISCECTOMY LUMBAR FOUR-FIVE ;  Surgeon: Kristeen Miss, MD;  Location: [place] [facility]S;  Service: Neurosurgery;  Laterality: N/A;  . RADIOLOGY WITH ANESTHESIA N/A 10/09/2012   Procedure: RADIOLOGY WITH ANESTHESIA;  Surgeon: Rob Hickman, MD;  Location: [place];  Service: Radiology;  Laterality: N/A;  . TONSILLECTOMY    . VASCULAR SURGERY     stent placed    Family History:  Family History  Problem Relation Age of  Onset  . Dementia Father   . Dementia Brother     Social History:  reports that she has never smoked. She does not have any smokeless tobacco history on file. She reports that she drinks alcohol. She reports that she does not use drugs.  Additional Social History:  Alcohol / Drug Use Pain Medications: See PTA Prescriptions: See PTA Over the Counter: See PTA History of alcohol / drug use?: No history of alcohol / drug abuse  CIWA: CIWA-Ar BP: (!) 129/58 Pulse Rate: 75 COWS:    Allergies:  Allergies  Allergen Reactions  . Latex Anaphylaxis  . Codeine Nausea Only  . Hydromorphone Itching    Home Medications:  (Not in a hospital admission)  OB/GYN Status:  No LMP recorded. Patient has had a hysterectomy.  General Assessment Data Location of Assessment: [facility] ED TTS Assessment: In system Is this a Tele or Face-to-Face Assessment?: Face-to-Face Is this an Initial Assessment or a Re-assessment for this encounter?: Initial Assessment Marital status: Single Maiden name: na Is patient pregnant?: No Pregnancy Status: No Living Arrangements: Spouse/significant other Can pt return to current living arrangement?: Yes Admission Status: Involuntary Is patient capable of signing voluntary admission?: Yes Referral Source: Self/Family/Friend Insurance type: [employer]     Crisis Care Plan Living Arrangements: Spouse/significant other Legal Guardian: Other: (self) Name of Psychiatrist: none reported Name of Therapist: none reported  Education Status Is patient currently in school?: No Current Grade: na Highest grade of school patient has completed: na Name  of school: na Contact person: na  Risk to self with the past 6 months Suicidal Ideation: Yes-Currently Present Has patient been a risk to self within the past 6 months prior to admission? : No Suicidal Intent: Yes-Currently Present Has patient had any suicidal intent within the past 6 months prior to  admission? : No Is patient at risk for suicide?: Yes Suicidal Plan?: Yes-Currently Present Has patient had any suicidal plan within the past 6 months prior to admission? : No Specify Current Suicidal Plan: Pt reports taking an overdose of Xanax Access to Means: Yes Specify Access to Suicidal Means: pt has access to prescription medications What has been your use of drugs/alcohol within the last 12 months?: glass of wine Previous Attempts/Gestures: No How many times?: 0 Other Self Harm Risks: none identified Triggers for Past Attempts: None known Intentional Self Injurious Behavior: None Family Suicide History: No Recent stressful life event(s): Loss (Comment) (death of family member) Persecutory voices/beliefs?: No Depression: Yes Depression Symptoms: Isolating, Tearfulness, Insomnia, Loss of interest in usual pleasures, Feeling worthless/self pity Substance abuse history and/or treatment for substance abuse?: No Suicide prevention information given to non-admitted patients: Not applicable  Risk to Others within the past 6 months Homicidal Ideation: No Does patient have any lifetime risk of violence toward others beyond the six months prior to admission? : No Thoughts of Harm to Others: No Current Homicidal Intent: No Current Homicidal Plan: No Access to Homicidal Means: No Identified Victim: none identified History of harm to others?: No Assessment of Violence: None Noted Violent Behavior Description: none identified Does patient have access to weapons?: No Criminal Charges Pending?: No Does patient have a court date: No Is patient on probation?: No  Psychosis Hallucinations: None noted Delusions: None noted  Mental Status Report Appearance/Hygiene: In scrubs Eye Contact: Fair Motor Activity: Freedom of movement Speech: Logical/coherent Level of Consciousness: Quiet/awake Mood: Depressed Affect: Appropriate to circumstance, Depressed Anxiety Level: Minimal Thought  Processes: Relevant Judgement: Impaired Orientation: Person, Place, Time, Situation Obsessive Compulsive Thoughts/Behaviors: None  Cognitive Functioning Concentration: Normal Memory: Recent Intact, Remote Intact IQ: Average Insight: Poor Impulse Control: Poor Appetite: Poor Sleep: Decreased Vegetative Symptoms: None  ADLScreening [facility] Assessment Services) Patient's cognitive ability adequate to safely complete daily activities?: Yes Patient able to express need for assistance with ADLs?: Yes Independently performs ADLs?: Yes (appropriate for developmental age)  Prior Inpatient Therapy Prior Inpatient Therapy: No Prior Therapy Dates: na Prior Therapy Facilty/Provider(s): na Reason for Treatment: na  Prior Outpatient Therapy Prior Outpatient Therapy: No Prior Therapy Dates: na Prior Therapy Facilty/Provider(s): na Reason for Treatment: na Does patient have an ACCT team?: No Does patient have Intensive In-House Services?  : No Does patient have [facility] services? : No Does patient have P4CC services?: No  ADL Screening (condition at time of admission) Patient's cognitive ability adequate to safely complete daily activities?: Yes Patient able to express need for assistance with ADLs?: Yes Independently performs ADLs?: Yes (appropriate for developmental age)       Abuse/Neglect Assessment (Assessment to be complete while patient is alone) Physical Abuse: Denies Verbal Abuse: Denies Sexual Abuse: Denies Exploitation of patient/patient's resources: Denies Self-Neglect: Denies Values / Beliefs Cultural Requests During Hospitalization: None Spiritual Requests During Hospitalization: None Consults Spiritual Care Consult Needed: No Social Work Consult Needed: No Regulatory affairs officer (For Healthcare) Does Patient Have a Medical Advance Directive?: No    Additional Information 1:1 In Past 12 Months?: No CIRT Risk: No Elopement Risk: No Does patient have medical  clearance?:  Yes     Disposition:  Disposition Initial Assessment Completed for this Encounter: Yes Disposition of Patient: Inpatient treatment program Type of inpatient treatment program: Adult  On Site Evaluation by:   Reviewed with Physician:    Oneita Hurt 06/15/2016 11:33 PM

## 2016-06-15 NOTE — ED Provider Notes (Signed)
[facility] Provider Note   CSN: [id number] Arrival date & time: 06/15/16  1705     History   Chief Complaint Chief Complaint  Patient presents with  . Alcohol Intoxication  . Ingestion    HPI Amber Brooks is a 71 y.o. female history of brain aneurysm status post multiple procedures, anxiety, depression, here presenting with suicidal ideation, possible intoxication. Patient states that her boyfriend broke up with her earlier today and she became depressed and drank some alcohol and took 4 Xanax pills. Patient woke up when the police was there to pick her up. Apparently her boyfriend called EMS and she was brought here under involuntary commitment.  The history is provided by the patient.    Past Medical History:  Diagnosis Date  . Anxiety   . 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 Brain aneurysm   . Complication of anesthesia   . Depression   . GERD (gastroesophageal reflux disease)   . Headache(784.0)    migraines  . Hypertension   . Mitral valve prolapse   . PONV (postoperative nausea and vomiting)     Patient Active Problem List   Diagnosis Date Noted  . Lumbar radiculopathy 04/26/2014  . Chest pain 04/06/2014  . Hypertension   . Mitral valve prolapse   . GERD (gastroesophageal reflux disease)   . Anxiety   . Depression   . Gastroesophageal reflux disease with esophagitis   . 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 Aneurysm, cerebral, nonruptured 11/18/2012    Past Surgical History:  Procedure Laterality Date  . ABDOMINAL HYSTERECTOMY  1987  . ANEURYSM COILING    . CRANIOTOMY Left 11/15/2012   Procedure: CRANIOTOMY INTRACRANIAL ANEURYSM FOR CAROTID;  Surgeon: Winfield Cunas, MD;  Location: [facility];  Service: Neurosurgery;  Laterality: Left;  LEFT Craniotomy for Aneurysm  . ECTOPIC PREGNANCY SURGERY    . LUMBAR LAMINECTOMY/DECOMPRESSION MICRODISCECTOMY N/A 04/27/2014   Procedure: LUMBAR LAMINECTOMY/DECOMPRESSION  MICRODISCECTOMY LUMBAR FOUR-FIVE ;  Surgeon: Kristeen Miss, MD;  Location: [facility] [facility];  Service: Neurosurgery;  Laterality: N/A;  . RADIOLOGY WITH ANESTHESIA N/A 10/09/2012   Procedure: RADIOLOGY WITH ANESTHESIA;  Surgeon: Rob Hickman, MD;  Location: [facility];  Service: Radiology;  Laterality: N/A;  . TONSILLECTOMY    . VASCULAR SURGERY     stent placed    OB History    No data available       Home Medications    Prior to Admission medications   Medication Sig Start Date End Date Taking? Authorizing Provider  ALPRAZolam Duanne Moron) 0.5 MG tablet Take 0.5 mg by mouth at bedtime as needed for sleep or anxiety.    [provider]  amLODipine (NORVASC) 10 MG tablet Take 10 mg by mouth daily.    [provider]  aspirin 81 MG chewable tablet Chew 81 mg by mouth daily.    [provider]  Biotin 5000 MCG CAPS Take 3 capsules by mouth daily.    [provider]  Cholecalciferol (VITAMIN D3 PO) Take 2 tablets by mouth daily.     [provider]  Cyanocobalamin (VITAMIN B-12 CR) 1000 MCG TBCR Take by mouth.    [provider]  diphenhydrAMINE (BENADRYL) 25 MG tablet Take 25 mg by mouth daily as needed for allergies.    [provider]  esomeprazole (NEXIUM) 40 MG capsule Take 40 mg by mouth daily before breakfast.    [provider]  estradiol (ESTRACE) 1 MG tablet Take  0.5 mg by mouth every other day.    [provider]  ezetimibe-simvastatin (VYTORIN) 10-40 MG per tablet Take 1 tablet by mouth at bedtime.     [provider]  HYDROcodone-acetaminophen (NORCO) 5-325 MG per tablet Take 1 tablet by mouth every 6 (six) hours as needed for moderate pain. 04/28/14   Kristeen Miss, MD  hyoscyamine (LEVSIN, ANASPAZ) 0.125 MG tablet Take 0.125 mg by mouth every 4 (four) hours as needed for bladder spasms or cramping.    [provider]  ibuprofen (ADVIL,MOTRIN) 800 MG tablet Take 800 mg by mouth every 8  (eight) hours as needed for headache.    [provider]  Magnesium 200 MG TABS Take 200 mg by mouth daily.     [provider]  methocarbamol (ROBAXIN) 500 MG tablet Take 1 tablet (500 mg total) by mouth 4 (four) times daily. 04/28/14   Kristeen Miss, MD  Multiple Vitamin (MULTIVITAMIN WITH MINERALS) TABS tablet Take 2 tablets by mouth daily. Gummy Vitamin    [provider]  nadolol (CORGARD) 40 MG tablet Take 40 mg by mouth every morning.     [provider]  promethazine (PHENERGAN) 12.5 MG tablet Take 1 tablet (12.5 mg total) by mouth every 6 (six) hours as needed for nausea. 10/10/12   Hedy Jacob, PA-C  promethazine (PHENERGAN) 12.5 MG tablet Take 1 tablet (12.5 mg total) by mouth every 6 (six) hours as needed for nausea or vomiting. 04/28/14   Kristeen Miss, MD  venlafaxine (EFFEXOR) 75 MG tablet Take 75 mg by mouth 2 (two) times daily.    [provider]    Family History Family History  Problem Relation Age of Onset  . Dementia Father   . Dementia Brother     Social History Social History  Substance Use Topics  . Smoking status: Never Smoker  . Smokeless tobacco: Not on file  . Alcohol use 0.0 oz/week    2 - 3 Glasses of wine per week     Allergies   Latex; Codeine; and Hydromorphone   Review of Systems Review of Systems  Psychiatric/Behavioral: Positive for dysphoric mood.  All other systems reviewed and are negative.    Physical Exam Updated Vital Signs BP (!) 125/56   Pulse 68   Temp 97.6 F (36.4 C) (Oral)   Resp 17   Ht 5\' 2"  (1.575 m)   Wt 51.7 kg (114 lb)   SpO2 97%   BMI 20.85 kg/m   Physical Exam  Constitutional:  Slightly depressed   HENT:  Head: Normocephalic.  Mouth/Throat: Oropharynx is clear and moist.  Eyes: EOM are normal. Pupils are equal, round, and reactive to light.  Neck: Normal range of motion. Neck supple.  Cardiovascular: Normal rate, regular rhythm and normal heart sounds.     Pulmonary/Chest: Effort normal and breath sounds normal. No respiratory distress. She has no wheezes. She has no rales.  Abdominal: Soft. Bowel sounds are normal. She exhibits no distension. There is no tenderness. There is no guarding.  Musculoskeletal: Normal range of motion.  Neurological: She is alert.  Skin: Skin is warm.  Psychiatric:  Depressed   Nursing note and vitals reviewed.    ED Treatments / Results  Labs (all labs ordered are listed, but only abnormal results are displayed) Labs Reviewed  COMPREHENSIVE METABOLIC PANEL - Abnormal; Notable for the following:       Result Value   Potassium 3.3 (*)    All other components within  normal limits  ETHANOL - Abnormal; Notable for the following:    Alcohol, Ethyl (B) 38 (*)    All other components within normal limits  ACETAMINOPHEN LEVEL - Abnormal; Notable for the following:    Acetaminophen (Tylenol), Serum <10 (*)    All other components within normal limits  URINE DRUG SCREEN, QUALITATIVE ([facility] ONLY) - Abnormal; Notable for the following:    Benzodiazepine, Ur Scrn POSITIVE (*)    All other components within normal limits  SALICYLATE LEVEL  CBC    EKG  EKG Interpretation None       Radiology Ct Head Wo Contrast  Result Date: 06/15/2016 CLINICAL DATA:  Intent to self-inflict harm. Head injury. ETOH use. EXAM: CT HEAD WITHOUT CONTRAST TECHNIQUE: Contiguous axial images were obtained from the base of the skull through the vertex without intravenous contrast. COMPARISON:  11/18/2012 FINDINGS: Brain: No acute intracranial hemorrhage, midline shift or edema. No large vascular territory infarct. Vascular: Status post left MCA aneurysm clipping with overlying craniotomy change. Skull: Left frontotemporal craniotomy change. No acute osseous abnormality. Sinuses/Orbits: No acute finding. Other: None. IMPRESSION: 1. No acute intracranial abnormality. 2. Left MCA clipping with overlying post craniotomy change. Electronically  Signed   By: Ashley Royalty M.D.   On: 06/15/2016 18:18    Procedures Procedures (including critical care time)  Medications Ordered in ED Medications  LORazepam (ATIVAN) 0.5 MG tablet (not administered)     Initial Impression / Assessment and Plan / ED Course  I have reviewed the triage vital signs and the nursing notes.  Pertinent labs & imaging results that were available during my care of the patient were reviewed by me and considered in my medical decision making (see chart for details).     Amber Brooks is a 71 y.o. female here with suicidal ideation, alcohol intoxication. Will get psych clearance labs, consult psych. ? Head injury and has hx of aneurysm so will get CT head.   7:57 PM Dr. Weber Cooks saw patient and upheld the IVC. He wants to look for geripsych placement. CT head showed no bleed. He ordered home meds.    Final Clinical Impressions(s) / ED Diagnoses   Final diagnoses:  None    New Prescriptions New Prescriptions   No medications on file     Drenda Freeze, MD 06/15/16 2236

## 2016-06-15 NOTE — ED Notes (Signed)
Pt becoming aggitated with staff stating "I checked myself in and I'll check myself out" this tech explains to pt the process that she is going through with admittion and pt continue to [facility] and stating "next time I wont come here because I'll succeed", pt stating multiple times "I don't want to be here, this hospital is a piece of shit and ya'll haven't done anything for me since I've been here" this tech explains to pt that she has been seen by ER physician as well as psych physician and she is waiting admission. Rn Ebony Hail made aware of conversation

## 2016-06-15 NOTE — ED Notes (Signed)
IVC 

## 2016-06-15 NOTE — ED Triage Notes (Signed)
She arrives today via [facility] from home with reports of drinking alcohol today and taking an unknown amount of xanax  Pt verbalizes that she did it all in order to harm herself

## 2016-06-15 NOTE — ED Notes (Signed)
She verbalizes feelings of depression and suicidal ideation since her last aneurysm was repaired in 2014  "I have not taken any medication for it"

## 2016-06-15 NOTE — ED Notes (Signed)
Pt states took medications in the am today

## 2016-06-15 NOTE — ED Notes (Signed)

## 2016-06-16 DIAGNOSIS — I10 Essential (primary) hypertension: Secondary | ICD-10-CM | POA: Diagnosis not present

## 2016-06-16 DIAGNOSIS — F419 Anxiety disorder, unspecified: Secondary | ICD-10-CM | POA: Diagnosis not present

## 2016-06-16 DIAGNOSIS — T510X2A Toxic effect of ethanol, intentional self-harm, initial encounter: Secondary | ICD-10-CM | POA: Diagnosis not present

## 2016-06-16 DIAGNOSIS — Z79899 Other long term (current) drug therapy: Secondary | ICD-10-CM | POA: Diagnosis not present

## 2016-06-16 DIAGNOSIS — Z885 Allergy status to narcotic agent status: Secondary | ICD-10-CM | POA: Diagnosis not present

## 2016-06-16 DIAGNOSIS — F332 Major depressive disorder, recurrent severe without psychotic features: Secondary | ICD-10-CM | POA: Diagnosis not present

## 2016-06-16 DIAGNOSIS — I341 Nonrheumatic mitral (valve) prolapse: Secondary | ICD-10-CM | POA: Diagnosis not present

## 2016-06-16 DIAGNOSIS — F339 Major depressive disorder, recurrent, unspecified: Secondary | ICD-10-CM | POA: Diagnosis not present

## 2016-06-16 DIAGNOSIS — K219 Gastro-esophageal reflux disease without esophagitis: Secondary | ICD-10-CM | POA: Diagnosis not present

## 2016-06-16 DIAGNOSIS — Z7982 Long term (current) use of aspirin: Secondary | ICD-10-CM | POA: Diagnosis not present

## 2016-06-16 DIAGNOSIS — Z91040 Latex allergy status: Secondary | ICD-10-CM | POA: Diagnosis not present

## 2016-06-16 DIAGNOSIS — Z7289 Other problems related to lifestyle: Secondary | ICD-10-CM | POA: Diagnosis not present

## 2016-06-16 DIAGNOSIS — F32A Depression, unspecified: Secondary | ICD-10-CM | POA: Insufficient documentation

## 2016-06-16 DIAGNOSIS — F329 Major depressive disorder, single episode, unspecified: Secondary | ICD-10-CM | POA: Diagnosis not present

## 2016-06-16 DIAGNOSIS — R45851 Suicidal ideations: Secondary | ICD-10-CM | POA: Diagnosis not present

## 2016-06-16 DIAGNOSIS — T424X2A Poisoning by benzodiazepines, intentional self-harm, initial encounter: Secondary | ICD-10-CM | POA: Diagnosis not present

## 2016-06-16 MED ORDER — NADOLOL 40 MG PO TABS
40.0000 mg | ORAL_TABLET | Freq: Every day | ORAL | Status: DC
  Administered 2016-06-16: 40 mg via ORAL
  Filled 2016-06-16: qty 1

## 2016-06-16 NOTE — BH Assessment (Signed)
Patient has been offered a bed at [facility], [phone]) Accepting MD:  Dr. Verta Ellen Call Report # [phone] Pt can arrive after 2 pm to:  [facility] [address] [place], [place] [address]

## 2016-06-16 NOTE — ED Notes (Addendum)
Pt informed she would be transferred to [facility]. Pt stated, "i'm not going. Who is making these decisions?" Pt then asking to speak with Dr. Weber Cooks. RN informed pt she may be moved before he comes on the unit and cannot guarantee the doctor would have time to speak with her.  Pt unhappy, but remained calm. Maintained on 15 minute checks and observation by security camera for safety.

## 2016-06-16 NOTE — ED Notes (Signed)
Dr. Weber Cooks has spoken with the patient a second time as well as her boyfriend.  Disposition remains the same, patient will still go to [facility].

## 2016-06-16 NOTE — BH Assessment (Addendum)
Patient has been accepted to [facility].  Accepting physician is Dr. Morrell Riddle.  Call report to [phone].  Representative was [place].  ER Staff is aware of it Lattie Haw, ER Sect.; & Abigail Butts, Patient's Nurse)     Clinician unable to reach EDP. ER Sec will inform Dr.  Pricilla Riffle [facility]) informed pt will not be accepting bed.

## 2016-06-16 NOTE — ED Notes (Addendum)
Pt pleasant, but annoyed by a buzzing sound in her room. Pt given remote for TV to help block out the noise. Pt accepting. Pt also complaining about the cold temperature in the unit. Security [employer] manager. Pt refused breakfast tray. Maintained on 15 minute checks and observation by security camera for safety.

## 2016-06-16 NOTE — ED Notes (Addendum)
Report called to Shirlee Limerick, RN at [facility].

## 2016-06-16 NOTE — ED Notes (Signed)
Pt denies SI. 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 "I have bills to pay." Pt stated, "I didn't take as much Xanax as the doctor thinks I did. If I had, I would be dead this morning. This is all a mistake."  Pt requested 2 of her home mediations. ER MD notified and medications ordered.  Pt is cooperative, speaking with her daughter on the phone. Maintained on 15 minute checks and observation by security camera for safety.

## 2016-06-16 NOTE — ED Notes (Signed)
Pt IVC, transported to [facility] via [employer].  Pt unhappy with disposition.  Clothing searched prior to patient getting dressed.  Pt left with officer without difficulty.

## 2016-06-16 NOTE — ED Notes (Signed)
RN attempted to call report. This Probation officer was asked to call back when patient is on the way to the facility.

## 2016-06-16 NOTE — ED Provider Notes (Addendum)
-----------------------------------------   7:16 AM on 06/16/2016 -----------------------------------------   Blood pressure (!) 133/57, pulse 71, temperature 98.2 F (36.8 C), temperature source Oral, resp. rate 16, height 5\' 2"  (1.575 m), weight 51.7 kg (114 lb), SpO2 100 %.  The patient had no acute events since last update. Patient has been seen by psychiatry they recommend inpatient psychiatric placement. Referrals have been made to geriatric psychiatric facilities.   Harvest Dark, MD 06/16/16 [phone]   ----------------------------------------- 2:36 PM on 06/16/2016 -----------------------------------------  I have seen and discussed with the patient her transfer to [facility]. Patient is adamant that she does not need to be transferred, however I have also discussed this with psychiatry we have agreed that the patient would benefit from transfer further treatment and evaluation.    Harvest Dark, MD 06/16/16 1436

## 2016-06-16 NOTE — Consult Note (Signed)
[facility] Consult   Reason for Consult:  Consult for 71 year old woman who came into the hospital after a suicide attempt Referring Physician:  Darl Householder Patient Identification: Amber Brooks MRN:  [id number] Principal Diagnosis: Severe recurrent major depression without psychotic features [facility]) Diagnosis:   Patient Active Problem List   Diagnosis Date Noted  . Suicide attempt ([place]) [T14.91XA] 06/15/2016  . Benzodiazepine overdose [T42.4X1A] 06/15/2016  . Severe recurrent major depression without psychotic features ([place]) [F33.2] 06/15/2016  . Lumbar radiculopathy [M54.16] 04/26/2014  . Chest pain [R07.9] 04/06/2014  . Hypertension [I10]   . Mitral valve prolapse [I34.1]   . GERD (gastroesophageal reflux disease) [K21.9]   . Anxiety [F41.9]   . Depression [F32.9]   . Gastroesophageal reflux disease with esophagitis [K21.0]   . Memory loss of unknown cause [R41.3] 12/03/2013  . Ataxia [R27.0] 12/03/2013  . 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 female patient admitted with "I tried to commit suicide".  This is a follow-up note for this 71 year old woman who was seen yesterday in the emergency room after a suicide attempt. On interview today the patient is much more alert than she was yesterday. Today however she is categorically denying that there was anything suicidal about what she did yesterday. I pointed out to her that she actually told me yesterday that she was trying to kill herself and she denied any memory of it. She is begging to be allowed to go home rather than be admitted to the hospital although her insight and seriousness about the condition seems to be shallow at best. I spoke with her boyfriend who was also here. Patient gave me permission to do that. He reports that her mood has been irritable and significantly worse recently. He confirms the fact that  she had made statements implying suicidality when she did the overdose yesterday.  HPI:  Patient interviewed chart reviewed. 71 year old woman brought to the hospital after taking an overdose of Xanax today. Patient says that she's been depressed for a long time at least since 2013. Her mood symptoms, and go but they've been worse recently. She reports that she will get depressed sometimes for no reason at all. Recently she has been feeling down much of the time. She sleeps okay but her appetite is poor and she has been losing weight. She says that today she decided to kill her self by overdosing on Xanax. Acute stressors include that a relative of hers, apparently the father of her niece, died of cancer. Also that the patient's boyfriend yelled at her while drinking. Patient denies that she was consuming any alcohol although there is alcohol in her urine and blood when she is seen here in the emergency room.  Social history: Patient has a boyfriend. I'm not certain if they lived together completely. Sounds like possibly they do from what she described. She works as a Art therapist.  Medical history: History of hypertension. She has a history of a brain aneurysm in the past. She says it has caused her to have migraines but doesn't report another specific neurologic problem.  Substance abuse history: Patient denies alcohol abuse denies any other drug abuse. She says that she uses her Xanax nightly as prescribed and drinks 1 glass of wine every day as prescribed by her doctor but doesn't think it is abusive  Past Psychiatric History: She says that she's been treated for  depression in the past. She takes Effexor 75 mg twice a day. No previous hospitalizations. No previous suicide attempts. Not seeing a psychiatrist or mental health provider. No history reported of psychosis or bipolar disorder  Risk to Self: Suicidal Ideation: Yes-Currently Present Suicidal Intent: Yes-Currently Present Is patient at  risk for suicide?: Yes Suicidal Plan?: Yes-Currently Present Specify Current Suicidal Plan: Pt reports taking an overdose of Xanax Access to Means: Yes Specify Access to Suicidal Means: pt has access to prescription medications What has been your use of drugs/alcohol within the last 12 months?: glass of wine How many times?: 0 Other Self Harm Risks: none identified Triggers for Past Attempts: None known Intentional Self Injurious Behavior: None Risk to Others: Homicidal Ideation: No Thoughts of Harm to Others: No Current Homicidal Intent: No Current Homicidal Plan: No Access to Homicidal Means: No Identified Victim: none identified History of harm to others?: No Assessment of Violence: None Noted Violent Behavior Description: none identified Does patient have access to weapons?: No Criminal Charges Pending?: No Does patient have a court date: No Prior Inpatient Therapy: Prior Inpatient Therapy: No Prior Therapy Dates: na Prior Therapy Facilty/Provider(s): na Reason for Treatment: na Prior Outpatient Therapy: Prior Outpatient Therapy: No Prior Therapy Dates: na Prior Therapy Facilty/Provider(s): na Reason for Treatment: na Does patient have an ACCT team?: No Does patient have Intensive In-House Services?  : No Does patient have [facility] services? : No Does patient have P4CC services?: No  Past Medical History:  Past Medical History:  Diagnosis Date  . Anxiety   . 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 Brain aneurysm   . Complication of anesthesia   . Depression   . GERD (gastroesophageal reflux disease)   . Headache(784.0)    migraines  . Hypertension   . Mitral valve prolapse   . PONV (postoperative nausea and vomiting)     Past Surgical History:  Procedure Laterality Date  . ABDOMINAL HYSTERECTOMY  1987  . ANEURYSM COILING    . CRANIOTOMY Left 11/15/2012   Procedure: CRANIOTOMY INTRACRANIAL ANEURYSM FOR CAROTID;  Surgeon: Winfield Cunas, MD;  Location: [place] [facility];  Service:  Neurosurgery;  Laterality: Left;  LEFT Craniotomy for Aneurysm  . ECTOPIC PREGNANCY SURGERY    . LUMBAR LAMINECTOMY/DECOMPRESSION MICRODISCECTOMY N/A 04/27/2014   Procedure: LUMBAR LAMINECTOMY/DECOMPRESSION MICRODISCECTOMY LUMBAR FOUR-FIVE ;  Surgeon: Kristeen Miss, MD;  Location: [place] [facility];  Service: Neurosurgery;  Laterality: N/A;  . RADIOLOGY WITH ANESTHESIA N/A 10/09/2012   Procedure: RADIOLOGY WITH ANESTHESIA;  Surgeon: Rob Hickman, MD;  Location: [place];  Service: Radiology;  Laterality: N/A;  . TONSILLECTOMY    . VASCULAR SURGERY     stent placed   Family History:  Family History  Problem Relation Age of Onset  . Dementia Father   . Dementia Brother    Family Psychiatric  History: Denies knowing of any Social History:  History  Alcohol Use  . 0.0 oz/week  . 2 - 3 Glasses of wine per week     History  Drug Use No    Social History   Social History  . Marital status: Divorced    Spouse name: N/A  . Number of children: N/A  . Years of education: N/A   Social History Main Topics  . Smoking status: Never Smoker  . Smokeless tobacco: Not on file  . Alcohol use 0.0 oz/week    2 - 3 Glasses of wine per week  . Drug use: No  . Sexual activity: Not Currently  Other Topics Concern  . Not on file   Social History Narrative   Right handed.  Caffeine decaf 1 cup in am.  Divorced.  1 adopted daughter.  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    Additional Social History:    Allergies:   Allergies  Allergen Reactions  . Latex Anaphylaxis  . Codeine Nausea Only  . Hydromorphone Itching    Labs:  Results for orders placed or performed during the hospital encounter of 06/15/16 (from the past 48 hour(s))  Comprehensive metabolic panel     Status: Abnormal   Collection Time: 06/15/16  5:11 PM  Result Value Ref Range   Sodium 139 135 - 145 mmol/L   Potassium 3.3 (L) 3.5 - 5.1 mmol/L   Chloride 106 101 - 111 mmol/L   CO2 26 22 - 32 mmol/L   Glucose, Bld 87 65 - 99 mg/dL    BUN 19 6 - 20 mg/dL   Creatinine, Ser 0.62 0.44 - 1.00 mg/dL   Calcium 9.4 8.9 - 10.3 mg/dL   Total Protein 7.3 6.5 - 8.1 g/dL   Albumin 4.4 3.5 - 5.0 g/dL   AST 31 15 - 41 U/L   ALT 29 14 - 54 U/L   Alkaline Phosphatase 71 38 - 126 U/L   Total Bilirubin 0.7 0.3 - 1.2 mg/dL   GFR calc non Af Amer >60 >60 mL/min   GFR calc Af Amer >60 >60 mL/min    Comment: (NOTE) The eGFR has been calculated using the CKD EPI equation. This calculation has not been validated in all clinical situations. eGFR's persistently <60 mL/min signify possible Chronic Kidney Disease.    Anion gap 7 5 - 15  Ethanol     Status: Abnormal   Collection Time: 06/15/16  5:11 PM  Result Value Ref Range   Alcohol, Ethyl (B) 38 (H) <5 mg/dL    Comment:        LOWEST DETECTABLE LIMIT FOR SERUM ALCOHOL IS 5 mg/dL FOR MEDICAL PURPOSES ONLY   Salicylate level     Status: None   Collection Time: 06/15/16  5:11 PM  Result Value Ref Range   Salicylate Lvl <1.6 2.8 - 30.0 mg/dL  Acetaminophen level     Status: Abnormal   Collection Time: 06/15/16  5:11 PM  Result Value Ref Range   Acetaminophen (Tylenol), Serum <10 (L) 10 - 30 ug/mL    Comment:        THERAPEUTIC CONCENTRATIONS VARY SIGNIFICANTLY. A RANGE OF 10-30 ug/mL MAY BE AN EFFECTIVE CONCENTRATION FOR MANY PATIENTS. HOWEVER, SOME ARE BEST TREATED AT CONCENTRATIONS OUTSIDE THIS RANGE. ACETAMINOPHEN CONCENTRATIONS >150 ug/mL AT 4 HOURS AFTER INGESTION AND >50 ug/mL AT 12 HOURS AFTER INGESTION ARE OFTEN ASSOCIATED WITH TOXIC REACTIONS.   cbc     Status: None   Collection Time: 06/15/16  5:11 PM  Result Value Ref Range   WBC 6.5 3.6 - 11.0 K/uL   RBC 4.40 3.80 - 5.20 MIL/uL   Hemoglobin 13.7 12.0 - 16.0 g/dL   HCT 40.9 35.0 - 47.0 %   MCV 92.9 80.0 - 100.0 fL   MCH 31.1 26.0 - 34.0 pg   MCHC 33.5 32.0 - 36.0 g/dL   RDW 12.6 11.5 - 14.5 %   Platelets 228 150 - 440 K/uL  Urine Drug Screen, Qualitative     Status: Abnormal   Collection Time: 06/15/16   5:11 PM  Result Value Ref Range   Tricyclic, Ur Screen NONE DETECTED NONE DETECTED   Amphetamines,  Ur Screen NONE DETECTED NONE DETECTED   MDMA (Ecstasy)Ur Screen NONE DETECTED NONE DETECTED   Cocaine Metabolite,Ur [place] NONE DETECTED NONE DETECTED   Opiate, Ur Screen NONE DETECTED NONE DETECTED   Phencyclidine (PCP) Ur S NONE DETECTED NONE DETECTED   Cannabinoid 50 Ng, Ur [place] NONE DETECTED NONE DETECTED   Barbiturates, Ur Screen NONE DETECTED NONE DETECTED   Benzodiazepine, Ur Scrn POSITIVE (A) NONE DETECTED   Methadone Scn, Ur NONE DETECTED NONE DETECTED    Comment: (NOTE) 621  Tricyclics, urine               Cutoff 1000 ng/mL 200  Amphetamines, urine             Cutoff 1000 ng/mL 300  MDMA (Ecstasy), urine           Cutoff 500 ng/mL 400  Cocaine Metabolite, urine       Cutoff 300 ng/mL 500  Opiate, urine                   Cutoff 300 ng/mL 600  Phencyclidine (PCP), urine      Cutoff 25 ng/mL 700  Cannabinoid, urine              Cutoff 50 ng/mL 800  Barbiturates, urine             Cutoff 200 ng/mL 900  Benzodiazepine, urine           Cutoff 200 ng/mL 1000 Methadone, urine                Cutoff 300 ng/mL 1100 1200 The urine drug screen provides only a preliminary, unconfirmed 1300 analytical test result and should not be used for non-medical 1400 purposes. Clinical consideration and professional judgment should 1500 be applied to any positive drug screen result due to possible 1600 interfering substances. A more specific alternate chemical method 1700 must be used in order to obtain a confirmed analytical result.  1800 Gas chromato graphy / mass spectrometry (GC/MS) is the preferred 1900 confirmatory method.     Current Facility-Administered Medications  Medication Dose Route Frequency Provider Last Rate Last Dose  . amLODipine (NORVASC) tablet 5 mg  5 mg Oral Daily Meleane Selinger T, MD   5 mg at 06/16/16 0925  . nadolol (CORGARD) tablet 40 mg  40 mg Oral Daily Harvest Dark,  MD   40 mg at 06/16/16 1023  . pantoprazole (PROTONIX) EC tablet 40 mg  40 mg Oral Daily Lorelie Biermann, Madie Reno, MD   40 mg at 06/16/16 1023  . venlafaxine [facility]) tablet 75 mg  75 mg Oral BID WC Hendry Speas, Madie Reno, MD   75 mg at 06/16/16 3086   Current Outpatient Prescriptions  Medication Sig Dispense Refill  . ALPRAZolam (XANAX) 0.5 MG tablet Take 0.5 mg by mouth at bedtime as needed for sleep or anxiety.    Marland Kitchen amLODipine (NORVASC) 10 MG tablet Take 10 mg by mouth daily.    . Cholecalciferol (VITAMIN D3 PO) Take 2 tablets by mouth daily.     . Cyanocobalamin (VITAMIN B-12 CR) 1000 MCG TBCR Take by mouth.    . esomeprazole (NEXIUM) 40 MG capsule Take 40 mg by mouth daily before breakfast.    . ibuprofen (ADVIL,MOTRIN) 800 MG tablet Take 800 mg by mouth every 8 (eight) hours as needed for headache.    . Multiple Vitamin (MULTIVITAMIN WITH MINERALS) TABS tablet Take 2 tablets by mouth daily. Gummy Vitamin    . venlafaxine [facility])  75 MG tablet Take 75 mg by mouth 2 (two) times daily.    . vitamin C (ASCORBIC ACID) 500 MG tablet Take 500 mg by mouth daily.    Marland Kitchen aspirin 81 MG chewable tablet Chew 81 mg by mouth daily.    . Biotin 5000 MCG CAPS Take 3 capsules by mouth daily.    . diphenhydrAMINE (BENADRYL) 25 MG tablet Take 25 mg by mouth daily as needed for allergies.    Marland Kitchen estradiol (ESTRACE) 1 MG tablet Take 0.5 mg by mouth every other day.    . ezetimibe-simvastatin (VYTORIN) 10-40 MG per tablet Take 1 tablet by mouth at bedtime.     Marland Kitchen HYDROcodone-acetaminophen (NORCO) 5-325 MG per tablet Take 1 tablet by mouth every 6 (six) hours as needed for moderate pain. (Patient not taking: Reported on 06/15/2016) 30 tablet 0  . hyoscyamine (LEVSIN, ANASPAZ) 0.125 MG tablet Take 0.125 mg by mouth every 4 (four) hours as needed for bladder spasms or cramping.    . Magnesium 200 MG TABS Take 200 mg by mouth daily.     . methocarbamol (ROBAXIN) 500 MG tablet Take 1 tablet (500 mg total) by mouth 4 (four) times  daily. (Patient not taking: Reported on 06/15/2016) 30 tablet 2  . nadolol (CORGARD) 40 MG tablet Take 40 mg by mouth every morning.     . promethazine (PHENERGAN) 12.5 MG tablet Take 1 tablet (12.5 mg total) by mouth every 6 (six) hours as needed for nausea. (Patient not taking: Reported on 06/15/2016) 30 tablet 0  . promethazine (PHENERGAN) 12.5 MG tablet Take 1 tablet (12.5 mg total) by mouth every 6 (six) hours as needed for nausea or vomiting. (Patient not taking: Reported on 06/15/2016) 30 tablet 0    Musculoskeletal: Strength & Muscle Tone: decreased Gait & Station: ataxic Patient leans: N/A  Psychiatric Specialty Exam: Physical Exam  Nursing note and vitals reviewed. Constitutional: She appears well-developed and well-nourished.  HENT:  Head: Normocephalic and atraumatic.  Eyes: Conjunctivae are normal. Pupils are equal, round, and reactive to light.  Neck: Normal range of motion.  Cardiovascular: Regular rhythm and normal heart sounds.   Respiratory: Effort normal. No respiratory distress.  GI: Soft.  Musculoskeletal: Normal range of motion.  Neurological: She is alert.  Skin: Skin is warm and dry.  Psychiatric: Her affect is blunt. Her speech is delayed. She is slowed and withdrawn. Cognition and memory are impaired. She expresses impulsivity. She does not exhibit a depressed mood. She expresses no suicidal ideation. She exhibits abnormal recent memory.    Review of Systems  HENT: Negative.   Eyes: Negative.   Respiratory: Negative.   Cardiovascular: Negative.   Gastrointestinal: Negative.   Musculoskeletal: Negative.   Skin: Negative.   Neurological: Positive for weakness.  Psychiatric/Behavioral: Positive for memory loss. Negative for depression, hallucinations, substance abuse and suicidal ideas. The patient is nervous/anxious and has insomnia.     Blood pressure (!) 148/83, pulse 72, temperature 98.2 F (36.8 C), temperature source Oral, resp. rate 16, height 5' 2"   (1.575 m), weight 51.7 kg (114 lb), SpO2 100 %.Body mass index is 20.85 kg/m.  General Appearance: Casual  Eye Contact:  Minimal  Speech:  Slow and Slurred  Volume:  Decreased  Mood:  Depressed and Dysphoric  Affect:  Constricted  Thought Process:  Disorganized  Orientation:  Full (Time, Place, and Person)  Thought Content:  Rumination and Tangential  Suicidal Thoughts:  Yes.  with intent/plan  Homicidal Thoughts:  No  Memory:  Immediate;   Good Recent;   Poor Remote;   Fair  Judgement:  Impaired  Insight:  Shallow  Psychomotor Activity:  Decreased  Concentration:  Concentration: Fair  Recall:  [employer] of Knowledge:  Fair  Language:  Fair  Akathisia:  No  Handed:  Right  AIMS (if indicated):     Assets:  Desire for Improvement Housing Resilience  ADL's:  Intact  Cognition:  Impaired,  Mild  Sleep:        Treatment Plan Summary: Daily contact with patient to assess and evaluate symptoms and progress in treatment, Medication management and Plan Patient is denying suicidal ideation but there seems to be clear evidence that her mood has been impaired for more than just a day and that she was clear about being suicidal yesterday. Her insight and seriousness about this is very limited. I continue to support the idea of admission to the inpatient psychiatric unit. That is now available at [facility] and the plan is for transfer today. Continue under involuntary commitment.  Disposition: Recommend psychiatric Inpatient admission when medically cleared. Supportive therapy provided about ongoing stressors.  Alethia Berthold, MD 06/16/2016 2:32 PM

## 2016-06-16 NOTE — BH Assessment (Signed)
Per Dr. Weber Cooks, pt meets criteria for inpatient hospitalization.  Referral information for Geriatric placement have been faxed to:  [facility] ([phone]/[phone])  [facility] ([phone]/[phone])  [facility] [phone])  [facility] [phone])

## 2016-06-17 DIAGNOSIS — F329 Major depressive disorder, single episode, unspecified: Secondary | ICD-10-CM | POA: Diagnosis not present

## 2016-06-18 DIAGNOSIS — Z789 Other specified health status: Secondary | ICD-10-CM | POA: Diagnosis not present

## 2016-06-18 DIAGNOSIS — I671 Cerebral aneurysm, nonruptured: Secondary | ICD-10-CM | POA: Diagnosis not present

## 2016-06-18 DIAGNOSIS — I341 Nonrheumatic mitral (valve) prolapse: Secondary | ICD-10-CM | POA: Diagnosis not present

## 2016-06-18 DIAGNOSIS — I10 Essential (primary) hypertension: Secondary | ICD-10-CM | POA: Diagnosis not present

## 2016-06-18 DIAGNOSIS — Z7289 Other problems related to lifestyle: Secondary | ICD-10-CM | POA: Insufficient documentation

## 2016-06-18 DIAGNOSIS — F329 Major depressive disorder, single episode, unspecified: Secondary | ICD-10-CM | POA: Diagnosis not present

## 2016-06-18 DIAGNOSIS — K219 Gastro-esophageal reflux disease without esophagitis: Secondary | ICD-10-CM | POA: Diagnosis not present

## 2016-06-19 DIAGNOSIS — F329 Major depressive disorder, single episode, unspecified: Secondary | ICD-10-CM | POA: Diagnosis not present

## 2016-06-20 DIAGNOSIS — I341 Nonrheumatic mitral (valve) prolapse: Secondary | ICD-10-CM | POA: Diagnosis not present

## 2016-06-20 DIAGNOSIS — F329 Major depressive disorder, single episode, unspecified: Secondary | ICD-10-CM | POA: Diagnosis not present

## 2016-06-20 DIAGNOSIS — I10 Essential (primary) hypertension: Secondary | ICD-10-CM | POA: Diagnosis not present

## 2016-06-20 DIAGNOSIS — Z789 Other specified health status: Secondary | ICD-10-CM | POA: Diagnosis not present

## 2016-06-20 DIAGNOSIS — K219 Gastro-esophageal reflux disease without esophagitis: Secondary | ICD-10-CM | POA: Diagnosis not present

## 2016-06-20 DIAGNOSIS — Z8679 Personal history of other diseases of the circulatory system: Secondary | ICD-10-CM | POA: Diagnosis not present

## 2016-06-21 DIAGNOSIS — F329 Major depressive disorder, single episode, unspecified: Secondary | ICD-10-CM | POA: Diagnosis not present

## 2016-06-23 DIAGNOSIS — F322 Major depressive disorder, single episode, severe without psychotic features: Secondary | ICD-10-CM | POA: Diagnosis not present

## 2016-06-23 DIAGNOSIS — I110 Hypertensive heart disease with heart failure: Secondary | ICD-10-CM | POA: Diagnosis not present

## 2016-06-23 DIAGNOSIS — F430 Acute stress reaction: Secondary | ICD-10-CM | POA: Diagnosis not present

## 2016-06-23 DIAGNOSIS — E7800 Pure hypercholesterolemia, unspecified: Secondary | ICD-10-CM | POA: Diagnosis not present

## 2016-06-23 DIAGNOSIS — T1491XD Suicide attempt, subsequent encounter: Secondary | ICD-10-CM | POA: Diagnosis not present

## 2016-06-23 DIAGNOSIS — F4312 Post-traumatic stress disorder, chronic: Secondary | ICD-10-CM | POA: Diagnosis not present

## 2016-07-17 DIAGNOSIS — F332 Major depressive disorder, recurrent severe without psychotic features: Secondary | ICD-10-CM | POA: Diagnosis not present

## 2016-07-17 DIAGNOSIS — F410 Panic disorder [episodic paroxysmal anxiety] without agoraphobia: Secondary | ICD-10-CM | POA: Diagnosis not present

## 2016-07-17 DIAGNOSIS — F411 Generalized anxiety disorder: Secondary | ICD-10-CM | POA: Diagnosis not present

## 2016-07-17 DIAGNOSIS — T1502XA Foreign body in cornea, left eye, initial encounter: Secondary | ICD-10-CM | POA: Diagnosis not present

## 2016-07-21 ENCOUNTER — Ambulatory Visit (INDEPENDENT_AMBULATORY_CARE_PROVIDER_SITE_OTHER): Payer: PPO

## 2016-07-21 ENCOUNTER — Ambulatory Visit
Admission: EM | Admit: 2016-07-21 | Discharge: 2016-07-21 | Disposition: A | Discharge status: Home / Self Care | Payer: PPO | Attending: Family Medicine | Admitting: Family Medicine

## 2016-07-21 DIAGNOSIS — W19XXXA Unspecified fall, initial encounter: Secondary | ICD-10-CM

## 2016-07-21 DIAGNOSIS — S60222A Contusion of left hand, initial encounter: Secondary | ICD-10-CM

## 2016-07-21 DIAGNOSIS — M79642 Pain in left hand: Secondary | ICD-10-CM | POA: Diagnosis not present

## 2016-07-21 DIAGNOSIS — M7989 Other specified soft tissue disorders: Secondary | ICD-10-CM | POA: Diagnosis not present

## 2016-07-21 DIAGNOSIS — S6992XA Unspecified injury of left wrist, hand and finger(s), initial encounter: Secondary | ICD-10-CM | POA: Diagnosis not present

## 2016-07-21 MED ORDER — NAPROXEN 500 MG PO TABS: 500.0000 mg | ORAL_TABLET | Freq: Two times a day (BID) | ORAL | 0 refills | Status: DC

## 2016-07-21 NOTE — ED Triage Notes (Signed)
71 year old Caucasian female is here today with left hand and thumb pain that has been going on for the last three weeks. 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

## 2016-07-21 NOTE — ED Provider Notes (Signed)
CSN: [id number]     Arrival date & time 07/21/16  1145 History   First MD Initiated Contact with Patient 07/21/16 1205     Chief Complaint  Patient presents with  . 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 Patient states she was walking about 3 weeks ago and fell, catching herself with that hand. 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       Past Medical History:  Diagnosis Date  . Anxiety   . 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 Brain aneurysm   . Complication of anesthesia   . Depression   . GERD (gastroesophageal reflux disease)   . Headache(784.0)    migraines  . Hypertension   . Mitral valve prolapse   . PONV (postoperative nausea and vomiting)    Past Surgical History:  Procedure Laterality Date  . ABDOMINAL HYSTERECTOMY  1987  . ANEURYSM COILING    . CRANIOTOMY Left 11/15/2012   Procedure: CRANIOTOMY INTRACRANIAL ANEURYSM FOR CAROTID;  Surgeon: Winfield Cunas, MD;  Location: [place] [facility];  Service: Neurosurgery;  Laterality: Left;  LEFT Craniotomy for Aneurysm  . ECTOPIC PREGNANCY SURGERY    . LUMBAR LAMINECTOMY/DECOMPRESSION MICRODISCECTOMY N/A 04/27/2014   Procedure: LUMBAR LAMINECTOMY/DECOMPRESSION MICRODISCECTOMY LUMBAR FOUR-FIVE ;  Surgeon: Kristeen Miss, MD;  Location: [place] [facility];  Service: Neurosurgery;  Laterality: N/A;  . RADIOLOGY WITH ANESTHESIA N/A 10/09/2012   Procedure: RADIOLOGY WITH ANESTHESIA;  Surgeon: Rob Hickman, MD;  Location: [place];  Service: Radiology;  Laterality: N/A;  . TONSILLECTOMY    . VASCULAR SURGERY     stent placed   Family History  Problem Relation Age of Onset  . Dementia Father   . Dementia Brother    Social History    Substance Use Topics  . Smoking status: Never Smoker  . Smokeless tobacco: Never Used  . Alcohol use 0.0 oz/week    2 - 3 Glasses of wine per week   OB History    No data available     Review of Systems  Musculoskeletal: Positive for arthralgias (to left thumb after fall).  All other systems reviewed and are negative.   Allergies  Latex; Codeine; and Hydromorphone  Home Medications   Prior to Admission medications   Medication Sig Start Date End Date Taking? Authorizing Provider  amLODipine (NORVASC) 10 MG tablet Take 10 mg by mouth daily.   Yes [provider]  aspirin 81 MG chewable tablet Chew 81 mg by mouth daily.   Yes [provider]  Biotin 5000 MCG CAPS Take 3 capsules by mouth daily.   Yes [provider]  Cholecalciferol (VITAMIN D3 PO) Take 2 tablets by mouth daily.    Yes [provider]  Cyanocobalamin (VITAMIN B-12 CR) 1000 MCG TBCR Take by mouth.   Yes [provider]  diphenhydrAMINE (BENADRYL) 25 MG tablet Take 25 mg by mouth daily as needed for allergies.   Yes [provider]  esomeprazole (NEXIUM) 40 MG capsule Take 40 mg by mouth daily before breakfast.   Yes [provider]  estradiol (ESTRACE) 1 MG tablet Take 0.5 mg by mouth every other day.   Yes [provider]  ezetimibe-simvastatin (VYTORIN) 10-40 MG per tablet Take 1 tablet by mouth  at bedtime.    Yes [provider]  ibuprofen (ADVIL,MOTRIN) 800 MG tablet Take 800 mg by mouth every 8 (eight) hours as needed for headache.   Yes [provider]  Magnesium 200 MG TABS Take 200 mg by mouth daily.    Yes [provider]  Multiple Vitamin (MULTIVITAMIN WITH MINERALS) TABS tablet Take 2 tablets by mouth daily. Gummy Vitamin   Yes [provider]  nadolol (CORGARD) 40 MG tablet Take 40 mg by mouth every morning.    Yes [provider]  venlafaxine (EFFEXOR) 75 MG tablet Take 75 mg by mouth 2  (two) times daily.   Yes [provider]  vitamin C (ASCORBIC ACID) 500 MG tablet Take 500 mg by mouth daily.   Yes [provider]  naproxen (NAPROSYN) 500 MG tablet Take 1 tablet (500 mg total) by mouth 2 (two) times daily. 07/21/16   Luvenia Redden, PA-C   Meds Ordered and Administered this Visit  Medications - No data to display  BP (!) 152/53 (BP Location: Left Arm)   Pulse 66   Temp 98.2 F (36.8 C) (Oral)   Resp 16   Ht 5\' 2"  (1.575 m)   Wt 116 lb 12.8 oz (53 kg)   SpO2 100%   BMI 21.36 kg/m  No data found.   Physical Exam  Constitutional: She appears well-developed and well-nourished.  HENT:  Head: Normocephalic and atraumatic.  Eyes: Pupils are equal, round, and reactive to light. EOM are normal.  Neck: Normal range of motion.  Cardiovascular: Normal rate and regular rhythm.   Pulmonary/Chest: Effort normal.  Musculoskeletal:       Left hand: She exhibits decreased range of motion (at thumb saddle joint but unsure if more due to pain or injury.), tenderness, bony tenderness and swelling.       Hands:   Urgent Care Course     Procedures (including critical care time)  Labs Review Labs Reviewed - No data to display  Imaging Review Dg Hand Complete Left  Result Date: 07/21/2016 CLINICAL DATA:  Injured LEFT hand 3 weeks ago while walking on a trail, pain and swelling at lateral distal radius into base of thumb, remote injury 5 years ago EXAM: LEFT HAND - COMPLETE 3+ VIEW COMPARISON:  None FINDINGS: Mild osseous demineralization. Joint space narrowing at STT joint and first [place] joint. No acute fracture, dislocation, or bone destruction. IMPRESSION: Degenerative changes at radial border of carpus. No acute abnormalities. Electronically Signed   By: Lavonia Dana M.D.   On: 07/21/2016 12:36      MDM   1. Left hand pain   2. Contusion of left hand, initial encounter    Patient presents with left hand pain, swelling, and some decreased range of  motion after fall 3 weeks ago. X-rays above negative for any acute injury but some arthritis noted. We give patient a prescription for naproxen for pain and inflammation. Also placed pain and available splint. Hopefully this will give enough support for that saddle joint of the thumb recommend if she feels like she needs more support she can try one over-the-counter or local drug store. Patient able to follow-up with this clinic if pain continues or worsens or with an orthopedist. Patient is understanding of plan and is in agreement.  Luvenia Redden, PA-C     Luvenia Redden, PA-C 07/21/16 1258

## 2016-07-21 NOTE — Discharge Instructions (Signed)
-  Naproxen sodium twice a day for pain -can also use Tylenol for pain -avoid aggravating movements -rest, ice. -brace to L hand while awake and active until symptoms improve. If feel like need a brace with more thumb support, can try one OTC. -hand exercises attached

## 2016-08-04 DIAGNOSIS — F332 Major depressive disorder, recurrent severe without psychotic features: Secondary | ICD-10-CM | POA: Diagnosis not present

## 2016-08-18 DIAGNOSIS — Z79899 Other long term (current) drug therapy: Secondary | ICD-10-CM | POA: Diagnosis not present

## 2016-08-18 DIAGNOSIS — F419 Anxiety disorder, unspecified: Secondary | ICD-10-CM | POA: Diagnosis not present

## 2016-08-18 DIAGNOSIS — Z7982 Long term (current) use of aspirin: Secondary | ICD-10-CM | POA: Diagnosis not present

## 2016-08-18 DIAGNOSIS — E7800 Pure hypercholesterolemia, unspecified: Secondary | ICD-10-CM | POA: Diagnosis not present

## 2016-08-18 DIAGNOSIS — Z91040 Latex allergy status: Secondary | ICD-10-CM | POA: Diagnosis not present

## 2016-08-18 DIAGNOSIS — R110 Nausea: Secondary | ICD-10-CM | POA: Diagnosis not present

## 2016-08-18 DIAGNOSIS — F329 Major depressive disorder, single episode, unspecified: Secondary | ICD-10-CM | POA: Diagnosis not present

## 2016-08-18 DIAGNOSIS — R1013 Epigastric pain: Secondary | ICD-10-CM | POA: Diagnosis not present

## 2016-08-18 DIAGNOSIS — Z01810 Encounter for preprocedural cardiovascular examination: Secondary | ICD-10-CM | POA: Diagnosis not present

## 2016-08-18 DIAGNOSIS — K810 Acute cholecystitis: Secondary | ICD-10-CM | POA: Diagnosis not present

## 2016-08-18 DIAGNOSIS — R1011 Right upper quadrant pain: Secondary | ICD-10-CM | POA: Diagnosis not present

## 2016-08-18 DIAGNOSIS — Z885 Allergy status to narcotic agent status: Secondary | ICD-10-CM | POA: Diagnosis not present

## 2016-08-18 DIAGNOSIS — D72829 Elevated white blood cell count, unspecified: Secondary | ICD-10-CM | POA: Diagnosis not present

## 2016-08-18 DIAGNOSIS — K219 Gastro-esophageal reflux disease without esophagitis: Secondary | ICD-10-CM | POA: Diagnosis not present

## 2016-08-18 DIAGNOSIS — I10 Essential (primary) hypertension: Secondary | ICD-10-CM | POA: Diagnosis not present

## 2016-09-08 DIAGNOSIS — H2513 Age-related nuclear cataract, bilateral: Secondary | ICD-10-CM | POA: Diagnosis not present

## 2016-10-20 DIAGNOSIS — Z23 Encounter for immunization: Secondary | ICD-10-CM | POA: Diagnosis not present

## 2016-10-20 DIAGNOSIS — F322 Major depressive disorder, single episode, severe without psychotic features: Secondary | ICD-10-CM | POA: Diagnosis not present

## 2016-11-24 DIAGNOSIS — E7800 Pure hypercholesterolemia, unspecified: Secondary | ICD-10-CM | POA: Diagnosis not present

## 2016-11-24 DIAGNOSIS — M533 Sacrococcygeal disorders, not elsewhere classified: Secondary | ICD-10-CM | POA: Diagnosis not present

## 2016-11-24 DIAGNOSIS — M79605 Pain in left leg: Secondary | ICD-10-CM | POA: Diagnosis not present

## 2016-11-24 DIAGNOSIS — F419 Anxiety disorder, unspecified: Secondary | ICD-10-CM | POA: Diagnosis not present

## 2016-11-24 DIAGNOSIS — M7138 Other bursal cyst, other site: Secondary | ICD-10-CM | POA: Diagnosis not present

## 2016-11-24 DIAGNOSIS — M545 Low back pain: Secondary | ICD-10-CM | POA: Diagnosis not present

## 2016-11-24 DIAGNOSIS — I10 Essential (primary) hypertension: Secondary | ICD-10-CM | POA: Diagnosis not present

## 2016-11-24 DIAGNOSIS — R200 Anesthesia of skin: Secondary | ICD-10-CM | POA: Diagnosis not present

## 2016-11-24 DIAGNOSIS — M5126 Other intervertebral disc displacement, lumbar region: Secondary | ICD-10-CM | POA: Diagnosis not present

## 2016-11-24 DIAGNOSIS — Z885 Allergy status to narcotic agent status: Secondary | ICD-10-CM | POA: Diagnosis not present

## 2016-11-24 DIAGNOSIS — M48061 Spinal stenosis, lumbar region without neurogenic claudication: Secondary | ICD-10-CM | POA: Diagnosis not present

## 2016-11-24 DIAGNOSIS — Z9049 Acquired absence of other specified parts of digestive tract: Secondary | ICD-10-CM | POA: Diagnosis not present

## 2016-11-24 DIAGNOSIS — M79662 Pain in left lower leg: Secondary | ICD-10-CM | POA: Diagnosis not present

## 2016-11-24 DIAGNOSIS — M5417 Radiculopathy, lumbosacral region: Secondary | ICD-10-CM | POA: Diagnosis not present

## 2016-11-24 DIAGNOSIS — G9520 Unspecified cord compression: Secondary | ICD-10-CM | POA: Diagnosis not present

## 2016-11-24 DIAGNOSIS — Z79899 Other long term (current) drug therapy: Secondary | ICD-10-CM | POA: Diagnosis not present

## 2017-01-05 DIAGNOSIS — M25539 Pain in unspecified wrist: Secondary | ICD-10-CM | POA: Diagnosis not present

## 2017-01-05 DIAGNOSIS — M654 Radial styloid tenosynovitis [de Quervain]: Secondary | ICD-10-CM | POA: Diagnosis not present

## 2017-01-10 DIAGNOSIS — I10 Essential (primary) hypertension: Secondary | ICD-10-CM | POA: Diagnosis not present

## 2017-01-10 DIAGNOSIS — M4726 Other spondylosis with radiculopathy, lumbar region: Secondary | ICD-10-CM | POA: Diagnosis not present

## 2017-01-12 DIAGNOSIS — S63509A Unspecified sprain of unspecified wrist, initial encounter: Secondary | ICD-10-CM | POA: Diagnosis not present

## 2017-01-12 DIAGNOSIS — R197 Diarrhea, unspecified: Secondary | ICD-10-CM | POA: Diagnosis not present

## 2017-01-12 DIAGNOSIS — R6889 Other general symptoms and signs: Secondary | ICD-10-CM | POA: Diagnosis not present

## 2017-01-12 DIAGNOSIS — F322 Major depressive disorder, single episode, severe without psychotic features: Secondary | ICD-10-CM | POA: Diagnosis not present

## 2017-01-12 DIAGNOSIS — I110 Hypertensive heart disease with heart failure: Secondary | ICD-10-CM | POA: Diagnosis not present

## 2017-01-12 DIAGNOSIS — Z0000 Encounter for general adult medical examination without abnormal findings: Secondary | ICD-10-CM | POA: Diagnosis not present

## 2017-01-12 DIAGNOSIS — E7800 Pure hypercholesterolemia, unspecified: Secondary | ICD-10-CM | POA: Diagnosis not present

## 2017-01-12 DIAGNOSIS — Z1159 Encounter for screening for other viral diseases: Secondary | ICD-10-CM | POA: Diagnosis not present

## 2017-01-12 DIAGNOSIS — K219 Gastro-esophageal reflux disease without esophagitis: Secondary | ICD-10-CM | POA: Diagnosis not present

## 2017-01-12 DIAGNOSIS — G43109 Migraine with aura, not intractable, without status migrainosus: Secondary | ICD-10-CM | POA: Diagnosis not present

## 2017-01-12 DIAGNOSIS — F411 Generalized anxiety disorder: Secondary | ICD-10-CM | POA: Diagnosis not present

## 2017-01-12 DIAGNOSIS — I519 Heart disease, unspecified: Secondary | ICD-10-CM | POA: Diagnosis not present

## 2017-04-13 DIAGNOSIS — B354 Tinea corporis: Secondary | ICD-10-CM | POA: Diagnosis not present

## 2017-04-13 DIAGNOSIS — L299 Pruritus, unspecified: Secondary | ICD-10-CM | POA: Diagnosis not present

## 2017-04-13 DIAGNOSIS — R197 Diarrhea, unspecified: Secondary | ICD-10-CM | POA: Diagnosis not present

## 2017-04-24 DIAGNOSIS — B354 Tinea corporis: Secondary | ICD-10-CM | POA: Diagnosis not present

## 2017-10-19 ENCOUNTER — Ambulatory Visit
Admission: RE | Admit: 2017-10-19 | Discharge: 2017-10-19 | Disposition: A | Discharge status: Home / Self Care | Payer: PPO | Source: Ambulatory Visit | Attending: Physician Assistant | Admitting: Physician Assistant

## 2017-10-19 ENCOUNTER — Other Ambulatory Visit: Payer: Self-pay | Admitting: Physician Assistant

## 2017-10-19 DIAGNOSIS — W19XXXA Unspecified fall, initial encounter: Secondary | ICD-10-CM | POA: Diagnosis not present

## 2017-10-19 DIAGNOSIS — I110 Hypertensive heart disease with heart failure: Secondary | ICD-10-CM | POA: Diagnosis not present

## 2017-10-19 DIAGNOSIS — S3992XA Unspecified injury of lower back, initial encounter: Secondary | ICD-10-CM | POA: Diagnosis not present

## 2017-10-19 DIAGNOSIS — W19XXXD Unspecified fall, subsequent encounter: Secondary | ICD-10-CM

## 2017-10-19 DIAGNOSIS — R52 Pain, unspecified: Secondary | ICD-10-CM

## 2017-10-19 DIAGNOSIS — S299XXA Unspecified injury of thorax, initial encounter: Secondary | ICD-10-CM | POA: Diagnosis not present

## 2017-10-19 DIAGNOSIS — R350 Frequency of micturition: Secondary | ICD-10-CM | POA: Diagnosis not present

## 2017-10-19 DIAGNOSIS — M545 Low back pain: Secondary | ICD-10-CM | POA: Diagnosis not present

## 2017-11-23 DIAGNOSIS — G4700 Insomnia, unspecified: Secondary | ICD-10-CM | POA: Diagnosis not present

## 2017-11-23 DIAGNOSIS — M545 Low back pain: Secondary | ICD-10-CM | POA: Diagnosis not present

## 2017-11-23 DIAGNOSIS — M7989 Other specified soft tissue disorders: Secondary | ICD-10-CM | POA: Diagnosis not present

## 2017-12-25 DIAGNOSIS — R21 Rash and other nonspecific skin eruption: Secondary | ICD-10-CM | POA: Diagnosis not present

## 2018-01-11 DIAGNOSIS — M545 Low back pain: Secondary | ICD-10-CM | POA: Diagnosis not present

## 2018-01-11 DIAGNOSIS — M5442 Lumbago with sciatica, left side: Secondary | ICD-10-CM | POA: Diagnosis not present

## 2018-01-11 DIAGNOSIS — G8929 Other chronic pain: Secondary | ICD-10-CM | POA: Diagnosis not present

## 2018-02-08 DIAGNOSIS — F411 Generalized anxiety disorder: Secondary | ICD-10-CM | POA: Diagnosis not present

## 2018-02-08 DIAGNOSIS — F322 Major depressive disorder, single episode, severe without psychotic features: Secondary | ICD-10-CM | POA: Diagnosis not present

## 2018-02-08 DIAGNOSIS — J301 Allergic rhinitis due to pollen: Secondary | ICD-10-CM | POA: Diagnosis not present

## 2018-02-08 DIAGNOSIS — K589 Irritable bowel syndrome without diarrhea: Secondary | ICD-10-CM | POA: Diagnosis not present

## 2018-02-08 DIAGNOSIS — N959 Unspecified menopausal and perimenopausal disorder: Secondary | ICD-10-CM | POA: Diagnosis not present

## 2018-02-08 DIAGNOSIS — E7800 Pure hypercholesterolemia, unspecified: Secondary | ICD-10-CM | POA: Diagnosis not present

## 2018-02-08 DIAGNOSIS — M810 Age-related osteoporosis without current pathological fracture: Secondary | ICD-10-CM | POA: Diagnosis not present

## 2018-02-08 DIAGNOSIS — I519 Heart disease, unspecified: Secondary | ICD-10-CM | POA: Diagnosis not present

## 2018-02-08 DIAGNOSIS — I110 Hypertensive heart disease with heart failure: Secondary | ICD-10-CM | POA: Diagnosis not present

## 2018-02-08 DIAGNOSIS — Z0000 Encounter for general adult medical examination without abnormal findings: Secondary | ICD-10-CM | POA: Diagnosis not present

## 2018-02-08 DIAGNOSIS — G43109 Migraine with aura, not intractable, without status migrainosus: Secondary | ICD-10-CM | POA: Diagnosis not present

## 2018-02-08 DIAGNOSIS — K219 Gastro-esophageal reflux disease without esophagitis: Secondary | ICD-10-CM | POA: Diagnosis not present

## 2018-04-01 ENCOUNTER — Other Ambulatory Visit: Payer: Self-pay

## 2018-04-01 NOTE — Patient Outreach (Signed)
[facility]) Care Management  04/01/2018  Amber Brooks 1945-12-09 [id number]   Referral Date: 04/01/2018 Referral Source: Nurseline Referral Reason: Chest pain and cough   Outreach Attempt: spoke with patient.  She is able to verify HIPAA.  She states that she is doing better than 2 weeks ago but still has some chest pain and cough.  She states she continues to monitor her symptoms and doing self care.  She states that if she has any further problems that she will call her physician.  Discussed with patient signs of worsening respiratory symptoms. She verbalized understanding and appreciative of call.  Patient denies any further needs.    Plan: RN CM will close case.     Jone Baseman, RN, MSN [facility] Care Management Care Management Coordinator Direct Line [phone] Toll Free: [phone]  Fax: [phone]

## 2018-05-01 DIAGNOSIS — Z800 Family history of malignant neoplasm of digestive organs: Secondary | ICD-10-CM | POA: Diagnosis not present

## 2018-05-01 DIAGNOSIS — K219 Gastro-esophageal reflux disease without esophagitis: Secondary | ICD-10-CM | POA: Diagnosis not present

## 2018-05-01 DIAGNOSIS — R1310 Dysphagia, unspecified: Secondary | ICD-10-CM | POA: Diagnosis not present

## 2018-05-01 DIAGNOSIS — Z86010 Personal history of colonic polyps: Secondary | ICD-10-CM | POA: Diagnosis not present

## 2018-05-24 DIAGNOSIS — H40013 Open angle with borderline findings, low risk, bilateral: Secondary | ICD-10-CM | POA: Diagnosis not present

## 2018-05-24 DIAGNOSIS — H5213 Myopia, bilateral: Secondary | ICD-10-CM | POA: Diagnosis not present

## 2018-05-24 DIAGNOSIS — H2513 Age-related nuclear cataract, bilateral: Secondary | ICD-10-CM | POA: Diagnosis not present

## 2018-06-03 DIAGNOSIS — J019 Acute sinusitis, unspecified: Secondary | ICD-10-CM | POA: Diagnosis not present

## 2018-06-03 DIAGNOSIS — Z7189 Other specified counseling: Secondary | ICD-10-CM | POA: Diagnosis not present

## 2018-06-13 DIAGNOSIS — Q438 Other specified congenital malformations of intestine: Secondary | ICD-10-CM | POA: Diagnosis not present

## 2018-06-13 DIAGNOSIS — K2930 Chronic superficial gastritis without bleeding: Secondary | ICD-10-CM | POA: Diagnosis not present

## 2018-06-13 DIAGNOSIS — K317 Polyp of stomach and duodenum: Secondary | ICD-10-CM | POA: Diagnosis not present

## 2018-06-13 DIAGNOSIS — R1310 Dysphagia, unspecified: Secondary | ICD-10-CM | POA: Diagnosis not present

## 2018-06-13 DIAGNOSIS — Z1211 Encounter for screening for malignant neoplasm of colon: Secondary | ICD-10-CM | POA: Diagnosis not present

## 2018-06-13 DIAGNOSIS — D123 Benign neoplasm of transverse colon: Secondary | ICD-10-CM | POA: Diagnosis not present

## 2018-06-13 DIAGNOSIS — K219 Gastro-esophageal reflux disease without esophagitis: Secondary | ICD-10-CM | POA: Diagnosis not present

## 2018-06-13 DIAGNOSIS — Z800 Family history of malignant neoplasm of digestive organs: Secondary | ICD-10-CM | POA: Diagnosis not present

## 2018-06-19 DIAGNOSIS — D123 Benign neoplasm of transverse colon: Secondary | ICD-10-CM | POA: Diagnosis not present

## 2018-06-19 DIAGNOSIS — K219 Gastro-esophageal reflux disease without esophagitis: Secondary | ICD-10-CM | POA: Diagnosis not present

## 2018-06-19 DIAGNOSIS — K317 Polyp of stomach and duodenum: Secondary | ICD-10-CM | POA: Diagnosis not present

## 2018-06-19 DIAGNOSIS — K2930 Chronic superficial gastritis without bleeding: Secondary | ICD-10-CM | POA: Diagnosis not present

## 2018-07-02 ENCOUNTER — Other Ambulatory Visit: Payer: Self-pay | Admitting: Gastroenterology

## 2018-07-18 DIAGNOSIS — H40013 Open angle with borderline findings, low risk, bilateral: Secondary | ICD-10-CM | POA: Diagnosis not present

## 2018-07-18 DIAGNOSIS — H2513 Age-related nuclear cataract, bilateral: Secondary | ICD-10-CM | POA: Diagnosis not present

## 2018-08-01 DIAGNOSIS — H40013 Open angle with borderline findings, low risk, bilateral: Secondary | ICD-10-CM | POA: Diagnosis not present

## 2018-08-01 DIAGNOSIS — H25812 Combined forms of age-related cataract, left eye: Secondary | ICD-10-CM | POA: Diagnosis not present

## 2018-08-01 DIAGNOSIS — H04123 Dry eye syndrome of bilateral lacrimal glands: Secondary | ICD-10-CM | POA: Diagnosis not present

## 2018-08-01 DIAGNOSIS — H2511 Age-related nuclear cataract, right eye: Secondary | ICD-10-CM | POA: Diagnosis not present

## 2018-08-16 DIAGNOSIS — H2512 Age-related nuclear cataract, left eye: Secondary | ICD-10-CM | POA: Diagnosis not present

## 2018-08-16 DIAGNOSIS — H25812 Combined forms of age-related cataract, left eye: Secondary | ICD-10-CM | POA: Diagnosis not present

## 2018-08-19 DIAGNOSIS — E7800 Pure hypercholesterolemia, unspecified: Secondary | ICD-10-CM | POA: Diagnosis not present

## 2018-08-19 DIAGNOSIS — I110 Hypertensive heart disease with heart failure: Secondary | ICD-10-CM | POA: Diagnosis not present

## 2018-08-23 DIAGNOSIS — F411 Generalized anxiety disorder: Secondary | ICD-10-CM | POA: Diagnosis not present

## 2018-08-23 DIAGNOSIS — E7800 Pure hypercholesterolemia, unspecified: Secondary | ICD-10-CM | POA: Diagnosis not present

## 2018-08-23 DIAGNOSIS — I110 Hypertensive heart disease with heart failure: Secondary | ICD-10-CM | POA: Diagnosis not present

## 2018-08-23 DIAGNOSIS — Z7189 Other specified counseling: Secondary | ICD-10-CM | POA: Diagnosis not present

## 2018-09-06 DIAGNOSIS — H2511 Age-related nuclear cataract, right eye: Secondary | ICD-10-CM | POA: Diagnosis not present

## 2018-09-06 DIAGNOSIS — H25811 Combined forms of age-related cataract, right eye: Secondary | ICD-10-CM | POA: Diagnosis not present

## 2018-10-10 DIAGNOSIS — H26492 Other secondary cataract, left eye: Secondary | ICD-10-CM | POA: Diagnosis not present

## 2018-10-10 DIAGNOSIS — Z961 Presence of intraocular lens: Secondary | ICD-10-CM | POA: Diagnosis not present

## 2018-10-18 DIAGNOSIS — H26492 Other secondary cataract, left eye: Secondary | ICD-10-CM | POA: Diagnosis not present

## 2018-11-11 DIAGNOSIS — Z961 Presence of intraocular lens: Secondary | ICD-10-CM | POA: Diagnosis not present

## 2018-11-11 DIAGNOSIS — H26491 Other secondary cataract, right eye: Secondary | ICD-10-CM | POA: Diagnosis not present

## 2019-01-09 ENCOUNTER — Other Ambulatory Visit: Payer: PPO

## 2019-01-09 DIAGNOSIS — Z20822 Contact with and (suspected) exposure to covid-19: Secondary | ICD-10-CM | POA: Diagnosis not present

## 2019-01-09 DIAGNOSIS — Z20828 Contact with and (suspected) exposure to other viral communicable diseases: Secondary | ICD-10-CM | POA: Diagnosis not present

## 2019-01-20 DIAGNOSIS — H40013 Open angle with borderline findings, low risk, bilateral: Secondary | ICD-10-CM | POA: Diagnosis not present

## 2019-02-16 ENCOUNTER — Ambulatory Visit: Payer: PPO | Attending: Internal Medicine

## 2019-02-16 ENCOUNTER — Encounter (INDEPENDENT_AMBULATORY_CARE_PROVIDER_SITE_OTHER): Payer: Self-pay

## 2019-02-16 DIAGNOSIS — Z23 Encounter for immunization: Secondary | ICD-10-CM

## 2019-02-16 NOTE — Progress Notes (Signed)
   Covid-19 Vaccination Clinic  Name:  Amber Brooks    MRN: [id number] DOB: [birth year]  02/16/2019  [name] was observed post Covid-19 immunization for 15 minutes without incidence. She was provided with Vaccine Information Sheet and instruction to access the V-Safe system.   [name] was instructed to call 911 with any severe reactions post vaccine: Marland Kitchen Difficulty breathing  . Swelling of your face and throat  . A fast heartbeat  . A bad rash all over your body  . Dizziness and weakness    Immunizations Administered    Name Date Dose VIS Date Route   Pfizer COVID-19 Vaccine 02/16/2019 11:03 AM 0.3 mL 12/13/2018 Intramuscular   Manufacturer: [place]   Lot: [id number]   [place]: [id number]

## 2019-03-18 ENCOUNTER — Ambulatory Visit: Payer: PPO | Attending: Internal Medicine

## 2019-03-18 DIAGNOSIS — Z23 Encounter for immunization: Secondary | ICD-10-CM

## 2019-03-18 NOTE — Progress Notes (Signed)
   Covid-19 Vaccination Clinic  Name:  Amber Brooks    MRN: [id number] DOB: Apr 27, 1945  03/18/2019  Amber Brooks was observed post Covid-19 immunization for 15 minutes without incident. She was provided with Vaccine Information Sheet and instruction to access the V-Safe system.   Amber Brooks was instructed to call 911 with any severe reactions post vaccine: Marland Kitchen Difficulty breathing  . Swelling of face and throat  . A fast heartbeat  . A bad rash all over body  . Dizziness and weakness   Immunizations Administered    Name Date Dose VIS Date Route   Pfizer COVID-19 Vaccine 03/18/2019  8:22 AM 0.3 mL 12/13/2018 Intramuscular   Manufacturer: [place]   Lot: [id number]   [place]: [id number]

## 2019-05-20 DIAGNOSIS — L601 Onycholysis: Secondary | ICD-10-CM | POA: Diagnosis not present

## 2019-05-20 DIAGNOSIS — L659 Nonscarring hair loss, unspecified: Secondary | ICD-10-CM | POA: Diagnosis not present

## 2019-05-20 DIAGNOSIS — L820 Inflamed seborrheic keratosis: Secondary | ICD-10-CM | POA: Diagnosis not present

## 2019-05-20 DIAGNOSIS — L988 Other specified disorders of the skin and subcutaneous tissue: Secondary | ICD-10-CM | POA: Diagnosis not present

## 2019-06-09 DIAGNOSIS — K589 Irritable bowel syndrome without diarrhea: Secondary | ICD-10-CM | POA: Diagnosis not present

## 2019-06-09 DIAGNOSIS — G43109 Migraine with aura, not intractable, without status migrainosus: Secondary | ICD-10-CM | POA: Diagnosis not present

## 2019-06-09 DIAGNOSIS — L609 Nail disorder, unspecified: Secondary | ICD-10-CM | POA: Diagnosis not present

## 2019-06-09 DIAGNOSIS — L853 Xerosis cutis: Secondary | ICD-10-CM | POA: Diagnosis not present

## 2019-06-09 DIAGNOSIS — I519 Heart disease, unspecified: Secondary | ICD-10-CM | POA: Diagnosis not present

## 2019-06-09 DIAGNOSIS — M810 Age-related osteoporosis without current pathological fracture: Secondary | ICD-10-CM | POA: Diagnosis not present

## 2019-06-09 DIAGNOSIS — E7800 Pure hypercholesterolemia, unspecified: Secondary | ICD-10-CM | POA: Diagnosis not present

## 2019-06-09 DIAGNOSIS — Z0000 Encounter for general adult medical examination without abnormal findings: Secondary | ICD-10-CM | POA: Diagnosis not present

## 2019-06-09 DIAGNOSIS — N959 Unspecified menopausal and perimenopausal disorder: Secondary | ICD-10-CM | POA: Diagnosis not present

## 2019-06-09 DIAGNOSIS — K219 Gastro-esophageal reflux disease without esophagitis: Secondary | ICD-10-CM | POA: Diagnosis not present

## 2019-06-09 DIAGNOSIS — I110 Hypertensive heart disease with heart failure: Secondary | ICD-10-CM | POA: Diagnosis not present

## 2019-06-09 DIAGNOSIS — J301 Allergic rhinitis due to pollen: Secondary | ICD-10-CM | POA: Diagnosis not present

## 2019-07-15 ENCOUNTER — Other Ambulatory Visit: Payer: Self-pay

## 2019-07-15 ENCOUNTER — Ambulatory Visit (INDEPENDENT_AMBULATORY_CARE_PROVIDER_SITE_OTHER): Payer: PPO

## 2019-07-15 ENCOUNTER — Ambulatory Visit
Admission: EM | Admit: 2019-07-15 | Discharge: 2019-07-15 | Disposition: A | Discharge status: Home / Self Care | Payer: PPO | Attending: Family Medicine | Admitting: Family Medicine

## 2019-07-15 DIAGNOSIS — S299XXA Unspecified injury of thorax, initial encounter: Secondary | ICD-10-CM | POA: Diagnosis not present

## 2019-07-15 MED ORDER — HYDROCODONE-ACETAMINOPHEN 5-325 MG PO TABS: 1.0000 | ORAL_TABLET | Freq: Three times a day (TID) | ORAL | 0 refills | Status: DC | PRN

## 2019-07-15 NOTE — ED Triage Notes (Signed)
Patient states that she tripped on Friday and landed hard on the ground. States that she landed on her right side and has been having right sided rib pain since.

## 2019-07-15 NOTE — Discharge Instructions (Signed)
Rest. Heat.  Medication as needed.  Take care  Dr. Niesha Bame  

## 2019-07-15 NOTE — ED Provider Notes (Signed)
[facility]    CSN: [id number] Arrival date & time: 07/15/19  1115      History   Chief Complaint Chief Complaint  Patient presents with  . Fall   HPI  73 year old female presents with the above complaint.  Patient reports that she suffered a fall on Friday.  Patient states that she was at work.  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  Patient reports that she fell forward and landed with her hand and arm close to her ribs and chest.  Since that time she has been experiencing right-sided rib pain.  Moderate to severe.  Worse with deep breathing, bending, and lifting.  She has taken some ibuprofen with some improvement.  No associated shortness of breath.  Patient reports bruising to her right elbow and right knee but states that these seem to be okay.  No other associated symptoms.  No other complaints.  Past Medical History:  Diagnosis Date  . Anxiety   . 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 Brain aneurysm   . Complication of anesthesia   . Depression   . GERD (gastroesophageal reflux disease)   . Headache(784.0)    migraines  . Hypertension   . Mitral valve prolapse   . PONV (postoperative nausea and vomiting)     Patient Active Problem List   Diagnosis Date Noted  . Suicide attempt ([place]) 06/15/2016  . Benzodiazepine overdose 06/15/2016  . Severe recurrent major depression without psychotic features ([place]) 06/15/2016  . Lumbar radiculopathy 04/26/2014  . Chest pain 04/06/2014  . Hypertension   . Mitral valve prolapse   . GERD (gastroesophageal reflux disease)   . Anxiety   . Depression   . Gastroesophageal reflux disease with esophagitis   . 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 Aneurysm, cerebral, nonruptured 11/18/2012    Past Surgical History:  Procedure Laterality Date  . ABDOMINAL HYSTERECTOMY  1987  . ANEURYSM COILING    . CRANIOTOMY Left 11/15/2012   Procedure: CRANIOTOMY INTRACRANIAL ANEURYSM FOR CAROTID;   Surgeon: Winfield Cunas, MD;  Location: [place] [facility];  Service: Neurosurgery;  Laterality: Left;  LEFT Craniotomy for Aneurysm  . ECTOPIC PREGNANCY SURGERY    . LUMBAR LAMINECTOMY/DECOMPRESSION MICRODISCECTOMY N/A 04/27/2014   Procedure: LUMBAR LAMINECTOMY/DECOMPRESSION MICRODISCECTOMY LUMBAR FOUR-FIVE ;  Surgeon: Kristeen Miss, MD;  Location: [place] [facility];  Service: Neurosurgery;  Laterality: N/A;  . RADIOLOGY WITH ANESTHESIA N/A 10/09/2012   Procedure: RADIOLOGY WITH ANESTHESIA;  Surgeon: Rob Hickman, MD;  Location: [facility];  Service: Radiology;  Laterality: N/A;  . TONSILLECTOMY    . VASCULAR SURGERY     stent placed    OB History   No obstetric history on file.      Home Medications    Prior to Admission medications   Medication Sig Start Date End Date Taking? Authorizing Provider  amLODipine (NORVASC) 10 MG tablet Take 10 mg by mouth daily.   Yes [provider]  Biotin 5000 MCG CAPS Take 3 capsules by mouth daily.   Yes [provider]  Cholecalciferol (VITAMIN D3 PO) Take 2 tablets by mouth daily.    Yes [provider]  Cyanocobalamin (VITAMIN B-12 CR) 1000 MCG TBCR Take by mouth.   Yes [provider]  esomeprazole (NEXIUM) 40 MG capsule Take 40 mg by mouth daily before breakfast.   Yes [provider]  estradiol (ESTRACE) 1 MG tablet Take 0.5 mg by mouth every other day.  Yes [provider]  ibuprofen (ADVIL,MOTRIN) 800 MG tablet Take 800 mg by mouth every 8 (eight) hours as needed for headache.   Yes [provider]  Magnesium 200 MG TABS Take 200 mg by mouth daily.    Yes [provider]  mirtazapine (REMERON) 7.5 MG tablet Take by mouth. 06/30/19  Yes [provider]  Multiple Vitamin (MULTIVITAMIN WITH MINERALS) TABS tablet Take 2 tablets by mouth daily. Gummy Vitamin   Yes [provider]  nadolol (CORGARD) 40 MG tablet Take 40 mg by mouth every morning.    Yes [provider]  naproxen (NAPROSYN) 500 MG tablet Take 1 tablet (500 mg total) by mouth 2 (two) times daily. 07/21/16  Yes Luvenia Redden, PA-C  rosuvastatin (CRESTOR) 20 MG tablet Take 20 mg by mouth daily. 02/27/19  Yes [provider]  venlafaxine (EFFEXOR) 75 MG tablet Take 75 mg by mouth 2 (two) times daily.   Yes [provider]  vitamin C (ASCORBIC ACID) 500 MG tablet Take 500 mg by mouth daily.   Yes [provider]  HYDROcodone-acetaminophen (NORCO/VICODIN) 5-325 MG tablet Take 1 tablet by mouth every 8 (eight) hours as needed for moderate pain or severe pain. 07/15/19   Coral Spikes, DO  diphenhydrAMINE (BENADRYL) 25 MG tablet Take 25 mg by mouth daily as needed for allergies.  07/15/19  [provider]  ezetimibe-simvastatin (VYTORIN) 10-40 MG per tablet Take 1 tablet by mouth at bedtime.   07/15/19  [provider]    Family History Family History  Problem Relation Age of Onset  . Dementia Father   . Dementia Brother     Social History Social History   Tobacco Use  . Smoking status: Never Smoker  . Smokeless tobacco: Never Used  Vaping Use  . Vaping Use: Never used  Substance Use Topics  . Alcohol use: Yes    Alcohol/week: 2.0 - 3.0 standard drinks    Types: 2 - 3 Glasses of wine per week  . Drug use: No     Allergies   Latex, Codeine, and Hydromorphone   Review of Systems Review of Systems  Respiratory: Negative.   Musculoskeletal:       Rib pain.   Physical Exam Triage Vital Signs ED Triage Vitals  Enc Vitals Group     BP 07/15/19 1137 (!) 157/67     Pulse Rate 07/15/19 1137 (!) 58     Resp 07/15/19 1137 18     Temp 07/15/19 1137 98.1 F (36.7 C)     Temp Source 07/15/19 1137 Oral     SpO2 07/15/19 1137 100 %     Weight 07/15/19 1134 122 lb (55.3 kg)     Height 07/15/19 1134 5\' 2"  (1.575 m)     Head Circumference --      Peak Flow --      Pain Score 07/15/19 1133 9     Pain Loc --      Pain Edu? --        Excl. in [place]? --    Updated Vital Signs BP (!) 157/67 (BP Location: Right Arm)   Pulse (!) 58   Temp 98.1 F (36.7 C) (Oral)   Resp 18   Ht 5\' 2"  (1.575 m)   Wt 55.3 kg   SpO2 100%   BMI 22.31 kg/m   Visual Acuity Right Eye Distance:   Left Eye Distance:   Bilateral Distance:    Right Eye  Near:   Left Eye Near:    Bilateral Near:     Physical Exam Vitals and nursing note reviewed.  Constitutional:      General: She is not in acute distress.    Appearance: Normal appearance. She is not ill-appearing.  HENT:     Head: Normocephalic and atraumatic.  Eyes:     General:        Right eye: No discharge.        Left eye: No discharge.     Conjunctiva/sclera: Conjunctivae normal.  Cardiovascular:     Rate and Rhythm: Regular rhythm. Bradycardia present.  Pulmonary:     Effort: Pulmonary effort is normal.     Breath sounds: Normal breath sounds. No wheezing, rhonchi or rales.  Chest:       Comments: Tenderness at the labelled location. Neurological:     Mental Status: She is alert.  Psychiatric:        Mood and Affect: Mood normal.        Behavior: Behavior normal.    UC Treatments / Results  Labs (all labs ordered are listed, but only abnormal results are displayed) Labs Reviewed - No data to display  EKG   Radiology DG Ribs Unilateral W/Chest Right  Result Date: 07/15/2019 CLINICAL DATA:  Fall 4 days ago. Right rib injury and pain. Initial encounter. EXAM: RIGHT RIBS AND CHEST - 3+ VIEW COMPARISON:  Chest radiograph on 04/06/2014 FINDINGS: No evidence of acute right rib fracture. An old fracture deformity is seen involving the right anterolateral 10th rib, which is unchanged in appearance since previous chest radiograph. No no evidence of pneumothorax or hemothorax. Both lungs are clear. Heart size and mediastinal contours are normal. Aortic atherosclerosis noted. IMPRESSION: No acute findings. Electronically Signed   By: Marlaine Hind M.D.   On: 07/15/2019  12:09    Procedures Procedures (including critical care time)  Medications Ordered in UC Medications - No data to display  Initial Impression / Assessment and Plan / UC Course  I have reviewed the triage vital signs and the nursing notes.  Pertinent labs & imaging results that were available during my care of the patient were reviewed by me and considered in my medical decision making (see chart for details).    74 year old female presents with a rib injury after suffering a fall.  X-ray obtained today and independently reviewed by me.  Interpretation: No acute fracture.  Advised rest and heat.  Hydrocodone to be used as needed particularly at night.  Supportive care.  Final Clinical Impressions(s) / UC Diagnoses   Final diagnoses:  Rib injury     Discharge Instructions     Rest.  Heat.  Medication as needed.  Take care  Dr. Lacinda Axon     ED Prescriptions    Medication Sig Dispense Auth. Provider   HYDROcodone-acetaminophen (NORCO/VICODIN) 5-325 MG tablet Take 1 tablet by mouth every 8 (eight) hours as needed for moderate pain or severe pain. 10 tablet Thersa Salt G, DO     I have reviewed the PDMP during this encounter.   Coral Spikes, DO 07/15/19 1224

## 2019-10-08 DIAGNOSIS — R002 Palpitations: Secondary | ICD-10-CM | POA: Diagnosis not present

## 2019-10-08 DIAGNOSIS — M549 Dorsalgia, unspecified: Secondary | ICD-10-CM | POA: Diagnosis not present

## 2019-12-18 ENCOUNTER — Encounter: Payer: Self-pay | Admitting: Family Medicine

## 2019-12-18 ENCOUNTER — Other Ambulatory Visit: Payer: Self-pay

## 2019-12-18 ENCOUNTER — Ambulatory Visit (INDEPENDENT_AMBULATORY_CARE_PROVIDER_SITE_OTHER): Payer: PPO | Admitting: Family Medicine

## 2019-12-18 VITALS — BP 157/54 | HR 65 | Temp 97.5°F | Resp 16 | Ht 62.0 in | Wt 120.0 lb

## 2019-12-18 DIAGNOSIS — I10 Essential (primary) hypertension: Secondary | ICD-10-CM | POA: Diagnosis not present

## 2019-12-18 DIAGNOSIS — I729 Aneurysm of unspecified site: Secondary | ICD-10-CM | POA: Diagnosis not present

## 2019-12-18 DIAGNOSIS — N3001 Acute cystitis with hematuria: Secondary | ICD-10-CM

## 2019-12-18 DIAGNOSIS — Z9049 Acquired absence of other specified parts of digestive tract: Secondary | ICD-10-CM | POA: Diagnosis not present

## 2019-12-18 DIAGNOSIS — Z7689 Persons encountering health services in other specified circumstances: Secondary | ICD-10-CM

## 2019-12-18 DIAGNOSIS — R002 Palpitations: Secondary | ICD-10-CM

## 2019-12-18 DIAGNOSIS — E782 Mixed hyperlipidemia: Secondary | ICD-10-CM | POA: Diagnosis not present

## 2019-12-18 DIAGNOSIS — N3000 Acute cystitis without hematuria: Secondary | ICD-10-CM | POA: Diagnosis not present

## 2019-12-18 DIAGNOSIS — R0789 Other chest pain: Secondary | ICD-10-CM | POA: Diagnosis not present

## 2019-12-18 DIAGNOSIS — F3342 Major depressive disorder, recurrent, in full remission: Secondary | ICD-10-CM

## 2019-12-18 DIAGNOSIS — K529 Noninfective gastroenteritis and colitis, unspecified: Secondary | ICD-10-CM

## 2019-12-18 DIAGNOSIS — B379 Candidiasis, unspecified: Secondary | ICD-10-CM

## 2019-12-18 LAB — POCT URINALYSIS DIPSTICK
Bilirubin, UA: NEGATIVE
Glucose, UA: NEGATIVE
Ketones, UA: NEGATIVE
Nitrite, UA: NEGATIVE
Protein, UA: POSITIVE — AB
Spec Grav, UA: 1.02 (ref 1.010–1.025)
Urobilinogen, UA: 0.2 E.U./dL
pH, UA: 5 (ref 5.0–8.0)

## 2019-12-18 MED ORDER — FLUCONAZOLE 150 MG PO TABS: ORAL_TABLET | ORAL | 0 refills | Status: DC

## 2019-12-18 MED ORDER — TRAZODONE HCL 50 MG PO TABS: 50.0000 mg | ORAL_TABLET | Freq: Every day | ORAL | 2 refills | Status: DC

## 2019-12-18 MED ORDER — CEPHALEXIN 500 MG PO CAPS: 500.0000 mg | ORAL_CAPSULE | Freq: Three times a day (TID) | ORAL | 0 refills | Status: DC

## 2019-12-18 NOTE — Patient Instructions (Addendum)
Thank you for coming to the office today.  Referred to GI  [facility]) [address] [place], [place] [address] Main: [phone]  OTC Peppermint Oil (Triple Coated Capsule) 180mg  take one 3 times daily to reduce diarrhea   Medication Taper Off Instructions Current med - Mirtazapine 7.5 mg   Week 1: Every night - take HALF of Mirtazapine 7.5mg  Week 2: Every OTHER day (HALF tablet) - alternating day is NO medicine (skip dose) STOP  Completely Then START new one - Trazodone 50mg  nightly  [facility]) [facility] [place], [place] [address] Main: [phone]   Stay tuned for referral.  1. You have a Urinary Tract Infection - this is very common, your symptoms are reassuring and you should get better within 1 week on the antibiotics - Start Keflex 500mg  3 times daily for next 7 days, complete entire course, even if feeling better - We sent urine for a culture, we will call you within next few days if we need to change antibiotics - Please drink plenty of fluids, improve hydration over next 1 week  If symptoms worsening, developing nausea / vomiting, worsening back pain, fevers / chills / sweats, then please return for re-evaluation sooner.  If you take AZO OTC - limit this to 2-3 days MAX to avoid affecting kidneys  D-Mannose is a natural supplement that can actually help bind to urinary bacteria and reduce their effectiveness it can help prevent UTI from forming, and may reduce some symptoms. It likely cannot cure an active UTI but it is worth a try and good to prevent them with. Try 500mg  twice a day at a full dose if you want, or check package instructions for more info  DUE for FASTING BLOOD WORK (no food or drink after midnight before the lab appointment, only water or coffee without cream/sugar on the morning of)  SCHEDULE "Lab Only" visit in the morning at the clinic for lab draw in  2 MONTHS   - Make sure Lab Only appointment is at about 1 week before your next appointment, so that results will be available  For Lab Results, once available within 2-3 days of blood draw, you can can log in to MyChart online to view your results and a brief explanation. Also, we can discuss results at next follow-up visit.   Please schedule a Follow-up Appointment to: Return in about 2 months (around 02/18/2020) for 2 month fasting lab then 1 week later Annual Physical.  If you have any other questions or concerns, please feel free to call the office or send a message through [place]. You may also schedule an earlier appointment if necessary.  Additionally, you may be receiving a survey about your experience at our office within a few days to 1 week by e-mail or mail. We value your feedback.  Nobie Putnam, DO [place]

## 2019-12-18 NOTE — Assessment & Plan Note (Signed)
Controlled depression, recurrent now in complete remission Dramatic improvement in past 20 years, has been controlled.  On SNRI Effexor, continue this On Mirtazapine for secondary insomnia limited results Taper off Mirtazapine 2 weeks, then start Trazodone 50mg  nightly

## 2019-12-18 NOTE — Progress Notes (Signed)
Subjective:    Patient ID: Amber Brooks, female    DOB: January 01, 1946, 74 y.o.   MRN: [id number]  Amber Brooks is a 74 y.o. female presenting on 12/18/2019 for Establish Care (As per patient had side effect after getting Covid vaccine and thinks getting worst after covid booster--had heart palpitation after 10/2019 getting worst and UTI--onset 5 th day --OTC taken )  Previously with [facility] - Dr Mayra Neer, in [place]  HPI  UTI S/p Cholecystectomy in 2018 Chronic Diarrhea episodic Reports dysuria significant pain with urination 4-5 days now, has had recurrent similar episodes in past due to trigger from diarrhea from chronic digestive problems. Takes OTC AZO Denies any hematuria, odor  Intermittent palpitations Syncopal Episode Chest Tightness Persistent issues following 10/2019 COVID Booster vaccine. She had previously seen Dr Daneen Schick Cardiology in [place] in past years ago, given fam history of heart disease. Now request repeat evaluation  CHRONIC HTN: Reports history of elevated BP had been controlled Current Meds - Amlodipine 10mg  daily, Nadolol 40mg  daily Reports good compliance, took meds today. Tolerating well, w/o complaints. 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 She had remote history years ago about taking too many Xanax pills - Admits insomnia issue with waking up overnight - was on Mirtazapine 7.5mg  limited results for few years, now asks to change med.   Health Maintenance: Review health maintenance at next visit. Colonoscopy reported 2019 [facility] will need record. Mammogram 2015. Overdue  Depression screen  [facility] 2/9 12/18/2019  Decreased Interest 0  Down, Depressed, Hopeless 0  PHQ - 2 Score 0  Altered sleeping 1  Tired, decreased energy 0  Change in appetite 0  Feeling bad or failure about yourself  0  Trouble concentrating 0  Moving slowly or fidgety/restless 0  Suicidal thoughts 0  PHQ-9 Score 1  Difficult doing work/chores Not difficult at all   GAD 7 : Generalized Anxiety Score 12/18/2019  Nervous, Anxious, on Edge 0  Control/stop worrying 0  Worry too much - different things 0  Trouble relaxing 1  Restless 0  Easily annoyed or irritable 1  Afraid - awful might happen 0  Total GAD 7 Score 2  Anxiety Difficulty Somewhat difficult     Past Medical History:  Diagnosis Date  . Anxiety   . 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 Brain aneurysm   . Complication of anesthesia   . Depression   . GERD (gastroesophageal reflux disease)   . Headache(784.0)    migraines  . Hypertension   . Mitral valve prolapse   . PONV (postoperative nausea and vomiting)    Past Surgical History:  Procedure Laterality Date  . ABDOMINAL HYSTERECTOMY  1987  . ANEURYSM COILING    . CRANIOTOMY Left 11/15/2012   Procedure: CRANIOTOMY INTRACRANIAL ANEURYSM FOR CAROTID;  Surgeon: Winfield Cunas, MD;  Location: [place] [facility];  Service: Neurosurgery;  Laterality: Left;  LEFT Craniotomy for Aneurysm  . ECTOPIC PREGNANCY SURGERY    . LUMBAR LAMINECTOMY/DECOMPRESSION MICRODISCECTOMY N/A 04/27/2014   Procedure: LUMBAR LAMINECTOMY/DECOMPRESSION MICRODISCECTOMY LUMBAR FOUR-FIVE ;  Surgeon: Kristeen Miss, MD;  Location: [place] [facility]UR[facility] ORS;  Service: Neurosurgery;  Laterality: N/A;  . RADIOLOGY WITH ANESTHESIA N/A 10/09/2012   Procedure:  RADIOLOGY WITH ANESTHESIA;  Surgeon: Rob Hickman, MD;  Location: [facility];  Service: Radiology;  Laterality: N/A;  . TONSILLECTOMY    . VASCULAR SURGERY     stent placed   Social History   Socioeconomic History  . Marital status: Divorced    Spouse name: Not on file  . Number of children:  Not on file  . Years of education: Not on file  . Highest education level: Not on file  Occupational History  . Not on file  Tobacco Use  . Smoking status: Never Smoker  . Smokeless tobacco: Never Used  Vaping Use  . Vaping Use: Never used  Substance and Sexual Activity  . Alcohol use: Yes    Alcohol/week: 2.0 - 3.0 standard drinks    Types: 2 - 3 Glasses of wine per week  . Drug use: No  . Sexual activity: Not Currently  Other Topics Concern  . Not on file  Social History Narrative   Right handed.  Caffeine decaf 1 cup in am.  Divorced.  1 adopted daughter.  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    Social Determinants of Health   Financial Resource Strain: Not on file  Food Insecurity: Not on file  Transportation Needs: Not on file  Physical Activity: Not on file  Stress: Not on file  Social Connections: Not on file  Intimate Partner Violence: Not on file   Family History  Problem Relation Age of Onset  . Dementia Father   . Dementia Brother    Current Outpatient Medications on File Prior to Visit  Medication Sig  . amLODipine (NORVASC) 10 MG tablet Take 10 mg by mouth daily.  Marland Kitchen esomeprazole (NEXIUM) 40 MG capsule Take 40 mg by mouth daily before breakfast.  . estradiol (ESTRACE) 1 MG tablet Take 0.5 mg by mouth every other day.  . ibuprofen (ADVIL,MOTRIN) 800 MG tablet Take 800 mg by mouth every 8 (eight) hours as needed for headache.  . Multiple Vitamin (MULTIVITAMIN WITH MINERALS) TABS tablet Take 2 tablets by mouth daily. Gummy Vitamin  . nadolol (CORGARD) 40 MG tablet Take 40 mg by mouth every morning.   . rosuvastatin (CRESTOR) 20 MG tablet Take 20 mg by mouth daily. As per pt is taking half of 20 mg  . vitamin C (ASCORBIC ACID) 500 MG tablet Take 500 mg by mouth daily.  . Biotin 5000 MCG CAPS Take 3 capsules by mouth daily.  . Cholecalciferol (VITAMIN D3 PO) Take 2 tablets by mouth daily.   . Cholecalciferol (VITAMIN D3) 100000 UNIT/GM POWD Take by mouth.  . Cyanocobalamin  (VITAMIN B-12 CR) 1000 MCG TBCR Take by mouth.  . Magnesium 200 MG TABS Take 200 mg by mouth daily.   . naproxen (NAPROSYN) 500 MG tablet Take 1 tablet (500 mg total) by mouth 2 (two) times daily.  Marland Kitchen venlafaxine XR (EFFEXOR XR) 75 MG 24 hr capsule Take 1 capsule (75 mg total) by mouth daily with breakfast.  . [DISCONTINUED] diphenhydrAMINE (BENADRYL) 25 MG tablet Take 25 mg by mouth daily as needed for allergies.  . [DISCONTINUED] ezetimibe-simvastatin (VYTORIN) 10-40 MG per tablet Take 1 tablet by mouth at bedtime.    No current facility-administered medications on file prior to visit.    Review of Systems Per HPI unless specifically indicated above      Objective:    BP (!) 157/54   Pulse 65   Temp (!) 97.5 F (36.4 C) (Temporal)   Resp 16   Ht 5\' 2"  (1.575  m)   Wt 120 lb (54.4 kg)   SpO2 100%   BMI 21.95 kg/m   Wt Readings from Last 3 Encounters:  12/18/19 120 lb (54.4 kg)  07/15/19 122 lb (55.3 kg)  07/21/16 116 lb 12.8 oz (53 kg)    Physical Exam Vitals and nursing note reviewed.  Constitutional:      General: She is not in acute distress.    Appearance: She is well-developed and well-nourished. She is not diaphoretic.     Comments: Well-appearing, comfortable, cooperative  HENT:     Head: Normocephalic and atraumatic.     Mouth/Throat:     Mouth: Oropharynx is clear and moist.  Eyes:     General:        Right eye: No discharge.        Left eye: No discharge.     Conjunctiva/sclera: Conjunctivae normal.  Neck:     Thyroid: No thyromegaly.  Cardiovascular:     Rate and Rhythm: Normal rate and regular rhythm.     Pulses: Intact distal pulses.     Heart sounds: Normal heart sounds. No murmur heard.     Comments: No ectopy Pulmonary:     Effort: Pulmonary effort is normal. No respiratory distress.     Breath sounds: Normal breath sounds. No wheezing or rales.  Musculoskeletal:        General: No edema. Normal range of motion.     Cervical back: Normal  range of motion and neck supple.  Lymphadenopathy:     Cervical: No cervical adenopathy.  Skin:    General: Skin is warm and dry.     Findings: No erythema or rash.  Neurological:     Mental Status: She is alert and oriented to person, place, and time.  Psychiatric:        Mood and Affect: Mood and affect normal.        Behavior: Behavior normal.     Comments: Well groomed, good eye contact, normal speech and thoughts      EKG - performed in office today  Date: 12/18/19  Rate: 60  Rhythm: normal sinus rhythm  QRS Axis: normal  Intervals: normal  ST/T Wave abnormalities: nonspecific ST changes  Conduction Disutrbances:none  Additional Narrative Interpretation: DISAGREE with EKG print out reading for Acute MI / Ischemia - EKG compared to several previous EKG including 06/15/16 and 04/07/14 and there was NO DIFFERENCE in ST segment changes. Patient is ASYMPTOMATIC at this time.    Results for orders placed or performed in visit on 12/18/19  POCT urinalysis dipstick  Result Value Ref Range   Color, UA dark amber    Clarity, UA cloudy    Glucose, UA Negative Negative   Bilirubin, UA Negative    Ketones, UA Negative    Spec Grav, UA 1.020 1.010 - 1.025   Blood, UA modrate    pH, UA 5.0 5.0 - 8.0   Protein, UA Positive (A) Negative   Urobilinogen, UA 0.2 0.2 or 1.0 E.U./dL   Nitrite, UA Negative    Leukocytes, UA Moderate (2+) (A) Negative   Appearance     Odor foul       Assessment & Plan:   Problem List Items Addressed This Visit    S/P cholecystectomy   Relevant Orders   Ambulatory referral to Gastroenterology   Recurrent major depression in complete remission ([place])    Controlled depression, recurrent now in complete remission Dramatic improvement in past 20 years, has been controlled.  On SNRI Effexor, continue this On Mirtazapine for secondary insomnia limited results Taper off Mirtazapine 2 weeks, then start Trazodone 50mg  nightly       Relevant Medications    traZODone (DESYREL) 50 MG tablet   venlafaxine XR (EFFEXOR XR) 75 MG 24 hr capsule   Mixed hyperlipidemia    Previously Controlled Continue Rosuvastatin      Relevant Orders   Ambulatory referral to Cardiology   Hypertension   Relevant Orders   Ambulatory referral to Cardiology   Aneurysm [facility])    Other Visit Diagnoses    Acute cystitis with hematuria    -  Primary   Relevant Medications   cephALEXin (KEFLEX) 500 MG capsule   Other Relevant Orders   Urine Culture   POCT urinalysis dipstick (Completed)   Encounter to establish care with new doctor       Antibiotic-induced yeast infection       Relevant Medications   cephALEXin (KEFLEX) 500 MG capsule   fluconazole (DIFLUCAN) 150 MG tablet   Intermittent palpitations       Relevant Orders   EKG 12-Lead   Ambulatory referral to Cardiology   Chest pressure       Relevant Orders   Ambulatory referral to Cardiology   Postprandial diarrhea       Relevant Orders   Ambulatory referral to Gastroenterology     UTI Consistent clinically Urine dipstick indicative of infection Order Urine culture Start Keflex 500 TID 7 day Follow-up if unresolved  #Diarrhea S/p [place] chronic diarrhea postprandial for past 3 years 2018 since cholecystectomy  Referral to GI for consultation Trial Peppermint Oil, dietary modification  #Palpitations, intermittent / Atypical Chest Pressure Clinically today asymptomatic, hemodynamically stable without any acute concern Will proceed with EKG as precaution prior to cardiology referral Question if secondary effect of COVID19 booster vaccine seemed to be onset  EKG result above, machine interpretation shows abnormality however, I disagree with this automatic interpretation, documented above, compared to past 2018 and 2016 results, ST segments are unchanged.  Refer to Cardiology  Strong family history of heart disease and stroke / heart attacks, and previously seen by Dr Daneen Schick in  [place] [employer] for surveillance and cardiovascular work up, also history of cerebral aneurysm, hyperlipidemia, hypertension. She has had new worsening problem palpitations intermittently with some chest pressure and finger tingling after COVID Booster 10/2019, request re-establish with local Cardiology   Return criteria given if severe change or worsening to seek care sooner. Follow-up  Orders Placed This Encounter  Procedures  . Urine Culture  . Ambulatory referral to Cardiology    Referral Priority:   Routine    Referral Type:   Consultation    Referral Reason:   Specialty Services Required    Requested Specialty:   Cardiology    Number of Visits Requested:   1  . Ambulatory referral to Gastroenterology    Referral Priority:   Routine    Referral Type:   Consultation    Referral Reason:   Specialty Services Required    Number of Visits Requested:   1  . POCT urinalysis dipstick  . EKG 12-Lead     Meds ordered this encounter  Medications  . cephALEXin (KEFLEX) 500 MG capsule    Sig: Take 1 capsule (500 mg total) by mouth 3 (three) times daily. For 7 days    Dispense:  21 capsule    Refill:  0  . fluconazole (DIFLUCAN) 150 MG tablet    Sig: Take one  tablet by mouth on Day 1. Repeat dose 2nd tablet on Day 3.    Dispense:  2 tablet    Refill:  0  . traZODone (DESYREL) 50 MG tablet    Sig: Take 1 tablet (50 mg total) by mouth at bedtime.    Dispense:  30 tablet    Refill:  2      Follow up plan: Return in about 2 months (around 02/18/2020) for 2 month fasting lab then 1 week later Annual Physical.  Nobie Putnam, DO [facility] 12/18/2019, 10:21 AM

## 2019-12-18 NOTE — Assessment & Plan Note (Signed)
Previously Controlled Continue Rosuvastatin

## 2019-12-20 LAB — URINE CULTURE
MICRO NUMBER:: 11327635
SPECIMEN QUALITY:: ADEQUATE

## 2019-12-24 DIAGNOSIS — N3001 Acute cystitis with hematuria: Secondary | ICD-10-CM

## 2019-12-24 MED ORDER — CIPROFLOXACIN HCL 500 MG PO TABS: 500.0000 mg | ORAL_TABLET | Freq: Two times a day (BID) | ORAL | 0 refills | Status: DC

## 2019-12-24 NOTE — Addendum Note (Signed)
Addended by: Olin Hauser on: 12/24/2019 01:26 PM   Modules accepted: Orders

## 2020-01-06 ENCOUNTER — Ambulatory Visit: Payer: PPO | Admitting: Cardiology

## 2020-01-12 ENCOUNTER — Encounter: Payer: Self-pay | Admitting: Cardiology

## 2020-01-12 ENCOUNTER — Other Ambulatory Visit: Payer: Self-pay

## 2020-01-12 ENCOUNTER — Ambulatory Visit: Payer: PPO | Admitting: Cardiology

## 2020-01-12 VITALS — BP 164/80 | HR 69 | Ht 62.0 in | Wt 120.0 lb

## 2020-01-12 DIAGNOSIS — I6521 Occlusion and stenosis of right carotid artery: Secondary | ICD-10-CM | POA: Diagnosis not present

## 2020-01-12 DIAGNOSIS — E7800 Pure hypercholesterolemia, unspecified: Secondary | ICD-10-CM | POA: Diagnosis not present

## 2020-01-12 DIAGNOSIS — R072 Precordial pain: Secondary | ICD-10-CM | POA: Diagnosis not present

## 2020-01-12 DIAGNOSIS — I10 Essential (primary) hypertension: Secondary | ICD-10-CM

## 2020-01-12 MED ORDER — CARVEDILOL 12.5 MG PO TABS: 12.5000 mg | ORAL_TABLET | Freq: Two times a day (BID) | ORAL | 5 refills | Status: DC

## 2020-01-12 MED ORDER — METOPROLOL TARTRATE 100 MG PO TABS: 100.0000 mg | ORAL_TABLET | Freq: Once | ORAL | 0 refills | Status: DC

## 2020-01-12 NOTE — Patient Instructions (Signed)
Medication Instructions:   Your physician has recommended you make the following change in your medication:   1.  STOP taking your nadolol (CORGARD) 40 MG tablet.  2.  START taking carvedilol (COREG) 12.5 MG tablet: Take 1 tablet (12.5 mg total) by mouth 2 (two) times daily.  *If you need a refill on your cardiac medications before your next appointment, please call your pharmacy*   Lab Work:  Your physician recommends that you have a BMP drawn today.   Testing/Procedures:  1.  Your physician has requested that you have an echocardiogram. Echocardiography is a painless test that uses sound waves to create images of your heart. It provides your doctor with information about the size and shape of your heart and how well your heart's chambers and valves are working. This procedure takes approximately one hour. There are no restrictions for this procedure.   2.  Your physician has requested that you have cardiac CT. Cardiac computed tomography (CT) is a painless test that uses an x-ray machine to take clear, detailed pictures of your heart.   Your cardiac CT will be scheduled at:  [facility] [address] [place], [place] [address] [phone]  Please arrive 15 mins early for check-in and test prep.    Please follow these instructions carefully (unless otherwise directed):     On the Night Before the Test: . Be sure to Drink plenty of water. . Do not consume any caffeinated/decaffeinated beverages or chocolate 12 hours prior to your test. . Do not take any antihistamines 12 hours prior to your test.    On the Day of the Test: . Drink plenty of water. Do not drink any water within one hour of the test. . Do not eat any food 4 hours prior to the test. . You may take your regular medications prior to the test.  . Take metoprolol (Lopressor) two hours prior to test. (this was sent to [facility] in [place]) . FEMALES- please  wear underwire-free bra if available        After the Test: . Drink plenty of water. . After receiving IV contrast, you may experience a mild flushed feeling. This is normal. . On occasion, you may experience a mild rash up to 24 hours after the test. This is not dangerous. If this occurs, you can take Benadryl 25 mg and increase your fluid intake. . If you experience trouble breathing, this can be serious. If it is severe call 911 IMMEDIATELY. If it is mild, please call our office.    Once we have confirmed authorization from your insurance company, we will call you to set up a date and time for your test. Based on how quickly your insurance processes prior authorizations requests, please allow up to 4 weeks to be contacted for scheduling your Cardiac CT appointment. Be advised that routine Cardiac CT appointments could be scheduled as many as 8 weeks after your provider has ordered it.  For non-scheduling related questions, please contact the cardiac imaging nurse navigator should you have any questions/concerns: Marchia Bond, Cardiac Imaging Nurse Navigator Burley Saver, Interim Cardiac Imaging Nurse [place] [facility] Direct Office Dial: [phone]   For scheduling needs, including cancellations and rescheduling, please call [place], [phone].    Follow-Up: At [facility], you and your health needs are our priority.  As part of our continuing mission to provide you with exceptional heart care, we have created designated Provider Care  Teams.  These Care Teams include your primary Cardiologist (physician) and Advanced Practice Providers (APPs -  Physician Assistants and Nurse Practitioners) who all work together to provide you with the care you need, when you need it.  We recommend signing up for the patient portal called "MyChart".  Sign up information is provided on this After Visit Summary.  MyChart is used to connect with patients for Virtual  Visits (Telemedicine).  Patients are able to view lab/test results, encounter notes, upcoming appointments, etc.  Non-urgent messages can be sent to your provider as well.   To learn more about what you can do with MyChart, go to [email or web address].    Your next appointment:   5 week(s)  The format for your next appointment:   In Person  Provider:   Kate Sable, MD   Other Instructions

## 2020-01-12 NOTE — Progress Notes (Signed)
Cardiology Office Note:    Date:  01/12/2020   ID:  Amber Brooks, DOB March 01, 1945, MRN [id number]  PCP:  Olin Hauser, DO  [place] Cardiologist:  Kate Sable, MD  [place] Electrophysiologist:  None   Referring MD: Nobie Putnam *   Chief Complaint  Patient presents with  . NEW patient    Referred by Dr. Parks Ranger; Patient c/o chest pressure and palpitations. Symptoms began after getting second COVID vaccine. Symptoms resolved but then recurred again after getting COVID booster.   Amber Brooks is a 75 y.o. female who is being seen today for the evaluation of chest pain at the request of Nobie Putnam *.   History of Present Illness:    Amber Brooks is a 75 y.o. female with a hx of hypertension, hyperlipidemia, brain aneurysm status post clipping who presents due to chest pain.  She states having palpitations after her second COVID-vaccine in May 2021.  Symptoms of palpitations improved.  She subsequently had her COVID booster vaccination October 2021.  After the vaccination, she had severe episodes of palpitations and also chest discomfort.  Her symptoms of palpitations subsequently resolved, but chest tightness persisted.  Rates chest tightness as 5 out of 10 in severity associated with exertion.  Symptoms typically last 5 minutes.  Family history of heart attack in brother 40s, father 96s.  She has a history of palpitations, started on labetalol over 30 years ago which she has been taking.  Also with history of hyperlipidemia, was placed on Crestor 20 mg daily, patient currently takes 10 mg daily, states not being compliant with Crestor.  Repeat fasting lipid panel is planned for next month with primary care provider.  Transthoracic echo 04/2014 showed normal systolic function, EF 60 to 65%, impaired relaxation. Lexiscan Myoview 2016 with no evidence of ischemia.  Past Medical History:  Diagnosis Date  . Anxiety   .  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 Brain aneurysm   . Complication of anesthesia   . Depression   . GERD (gastroesophageal reflux disease)   . Headache(784.0)    migraines  . Hypertension   . Mitral valve prolapse   . PONV (postoperative nausea and vomiting)     Past Surgical History:  Procedure Laterality Date  . ABDOMINAL HYSTERECTOMY  1987  . ANEURYSM COILING    . CHOLECYSTECTOMY  2018  . CRANIOTOMY Left 11/15/2012   Procedure: CRANIOTOMY INTRACRANIAL ANEURYSM FOR CAROTID;  Surgeon: Winfield Cunas, MD;  Location: [facility];  Service: Neurosurgery;  Laterality: Left;  LEFT Craniotomy for Aneurysm  . ECTOPIC PREGNANCY SURGERY    . LUMBAR LAMINECTOMY/DECOMPRESSION MICRODISCECTOMY N/A 04/27/2014   Procedure: LUMBAR LAMINECTOMY/DECOMPRESSION MICRODISCECTOMY LUMBAR FOUR-FIVE ;  Surgeon: Kristeen Miss, MD;  Location: [facility];  Service: Neurosurgery;  Laterality: N/A;  . RADIOLOGY WITH ANESTHESIA N/A 10/09/2012   Procedure: RADIOLOGY WITH ANESTHESIA;  Surgeon: Rob Hickman, MD;  Location: [facility];  Service: Radiology;  Laterality: N/A;  . TONSILLECTOMY    . VASCULAR SURGERY     stent placed    Current Medications: Current Meds  Medication Sig  . amLODipine (NORVASC) 10 MG tablet Take 10 mg by mouth daily.  . Biotin 5000 MCG CAPS Take 3 capsules by mouth daily.  . carvedilol (COREG) 12.5 MG tablet Take 1 tablet (12.5 mg total) by mouth 2 (two) times daily.  . Cholecalciferol (VITAMIN D3 PO) Take 2 tablets by mouth daily.   . Cholecalciferol (VITAMIN D3) 100000 UNIT/GM POWD Take by  mouth.  . Cyanocobalamin (VITAMIN B-12 CR) 1000 MCG TBCR Take by mouth.  . esomeprazole (NEXIUM) 40 MG capsule Take 40 mg by mouth daily before breakfast.  . estradiol (ESTRACE) 1 MG tablet Take 0.5 mg by mouth every other day.  . ibuprofen (ADVIL,MOTRIN) 800 MG tablet Take 800 mg by mouth every 8 (eight) hours as needed for headache.  . Magnesium 200 MG TABS Take 200 mg by mouth daily.   . metoprolol  tartrate (LOPRESSOR) 100 MG tablet Take 1 tablet (100 mg total) by mouth once for 1 dose. Take 2 hours prior to your CT scan.  . Multiple Vitamin (MULTIVITAMIN WITH MINERALS) TABS tablet Take 2 tablets by mouth daily. Gummy Vitamin  . naproxen (NAPROSYN) 500 MG tablet Take 1 tablet (500 mg total) by mouth 2 (two) times daily.  . rosuvastatin (CRESTOR) 20 MG tablet Take 20 mg by mouth daily. As per pt is taking half of 20 mg  . traZODone (DESYREL) 50 MG tablet Take 1 tablet (50 mg total) by mouth at bedtime.  Marland Kitchen venlafaxine XR (EFFEXOR-XR) 75 MG 24 hr capsule Take 1 capsule (75 mg total) by mouth daily with breakfast.  . vitamin C (ASCORBIC ACID) 500 MG tablet Take 500 mg by mouth daily.  . [DISCONTINUED] nadolol (CORGARD) 40 MG tablet Take 40 mg by mouth every morning.      Allergies:   Latex, Codeine, and Hydromorphone   Social History   Socioeconomic History  . Marital status: Divorced    Spouse name: Not on file  . Number of children: Not on file  . Years of education: Not on file  . Highest education level: Not on file  Occupational History  . Not on file  Tobacco Use  . Smoking status: Never Smoker  . Smokeless tobacco: Never Used  Vaping Use  . Vaping Use: Never used  Substance and Sexual Activity  . Alcohol use: Yes    Alcohol/week: 1.0 standard drink    Types: 1 Glasses of wine per week    Comment: every evening  . Drug use: No  . Sexual activity: Not Currently  Other Topics Concern  . Not on file  Social History Narrative   Right handed.  Caffeine decaf 1 cup in am.  Divorced.  1 adopted daughter.  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    Social Determinants of Health   Financial Resource Strain: Not on file  Food Insecurity: Not on file  Transportation Needs: Not on file  Physical Activity: Not on file  Stress: Not on file  Social Connections: Not on file     Family History: The patient's family history includes Dementia in her brother and father.  ROS:   Please see the  history of present illness.     All other systems reviewed and are negative.  EKGs/Labs/Other Studies Reviewed:    The following studies were reviewed today:   EKG:  EKG is  ordered today.  The ekg ordered today demonstrates sinus rhythm, old inferior lateral ST depressions.  Recent Labs: No results found for requested labs within last 8760 hours.  Recent Lipid Panel    Component Value Date/Time   CHOL 201 (H) 04/07/2014 0425   TRIG 163 (H) 04/07/2014 0425   HDL 61 04/07/2014 0425   CHOLHDL 3.3 04/07/2014 0425   VLDL 33 04/07/2014 0425   LDLCALC 107 (H) 04/07/2014 0425     Risk Assessment/Calculations:      Physical Exam:    VS:  BP (!) 164/80 (  BP Location: Right Arm, Patient Position: Sitting, Cuff Size: Normal)   Pulse 69   Ht 5' 2"  (1.575 m)   Wt 120 lb (54.4 kg)   SpO2 98%   BMI 21.95 kg/m     Wt Readings from Last 3 Encounters:  01/12/20 120 lb (54.4 kg)  12/18/19 120 lb (54.4 kg)  07/15/19 122 lb (55.3 kg)     GEN:  Well nourished, well developed in no acute distress HEENT: Normal NECK: No JVD; No carotid bruits LYMPHATICS: No lymphadenopathy CARDIAC: RRR, no murmurs, rubs, gallops RESPIRATORY:  Clear to auscultation without rales, wheezing or rhonchi  ABDOMEN: Soft, non-tender, non-distended MUSCULOSKELETAL:  No edema; No deformity  SKIN: Warm and dry NEUROLOGIC:  Alert and oriented x 3 PSYCHIATRIC:  Normal affect   ASSESSMENT:    1. Precordial pain   2. Primary hypertension   3. Pure hypercholesterolemia   4. Stenosis of right carotid artery    PLAN:    In order of problems listed above:  1. Chest pain, risk factors of age, hypertension, hyperlipidemia.  Will evaluate presence of CAD with coronary CTA.  Get echocardiogram to evaluate cardiac function and wall motion abnormalities. 2. History of hypertension, BP elevated.  Stop nadolol, start Coreg 12.5 mg twice daily.  Continue amlodipine. 3. Hyperlipidemia, continue Crestor.  Repeat fasting  lipid profile as scheduled.  If not controlled, recommend titrating statin. 4. Right carotid artery stenosis, angiogram 2012 states fibromuscular dysplasia.  Referred to vascular surgery for monitoring and management.  Continue aspirin 81 mg, statin.  Follow-up after echo and coronary CTA.    Medication Adjustments/Labs and Tests Ordered: Current medicines are reviewed at length with the patient today.  Concerns regarding medicines are outlined above.  Orders Placed This Encounter  Procedures  . CT CORONARY MORPH W/CTA COR W/SCORE W/CA W/CM &/OR WO/CM  . CT CORONARY FRACTIONAL FLOW RESERVE DATA PREP  . CT CORONARY FRACTIONAL FLOW RESERVE FLUID ANALYSIS  . Basic metabolic panel  . Ambulatory referral to Vascular Surgery  . EKG 12-Lead  . ECHOCARDIOGRAM COMPLETE   Meds ordered this encounter  Medications  . metoprolol tartrate (LOPRESSOR) 100 MG tablet    Sig: Take 1 tablet (100 mg total) by mouth once for 1 dose. Take 2 hours prior to your CT scan.    Dispense:  1 tablet    Refill:  0  . carvedilol (COREG) 12.5 MG tablet    Sig: Take 1 tablet (12.5 mg total) by mouth 2 (two) times daily.    Dispense:  60 tablet    Refill:  5    Patient Instructions  Medication Instructions:   Your physician has recommended you make the following change in your medication:   1.  STOP taking your nadolol (CORGARD) 40 MG tablet.  2.  START taking carvedilol (COREG) 12.5 MG tablet: Take 1 tablet (12.5 mg total) by mouth 2 (two) times daily.  *If you need a refill on your cardiac medications before your next appointment, please call your pharmacy*   Lab Work:  Your physician recommends that you have a BMP drawn today.   Testing/Procedures:  1.  Your physician has requested that you have an echocardiogram. Echocardiography is a painless test that uses sound waves to create images of your heart. It provides your doctor with information about the size and shape of your heart and how well  your heart's chambers and valves are working. This procedure takes approximately one hour. There are no restrictions for this procedure.  2.  Your physician has requested that you have cardiac CT. Cardiac computed tomography (CT) is a painless test that uses an x-ray machine to take clear, detailed pictures of your heart.   Your cardiac CT will be scheduled at:  [facility] [address] [place], [place] [address] [phone]  Please arrive 15 mins early for check-in and test prep.    Please follow these instructions carefully (unless otherwise directed):     On the Night Before the Test: . Be sure to Drink plenty of water. . Do not consume any caffeinated/decaffeinated beverages or chocolate 12 hours prior to your test. . Do not take any antihistamines 12 hours prior to your test.    On the Day of the Test: . Drink plenty of water. Do not drink any water within one hour of the test. . Do not eat any food 4 hours prior to the test. . You may take your regular medications prior to the test.  . Take metoprolol (Lopressor) two hours prior to test. (this was sent to [facility] in [place]) . FEMALES- please wear underwire-free bra if available        After the Test: . Drink plenty of water. . After receiving IV contrast, you may experience a mild flushed feeling. This is normal. . On occasion, you may experience a mild rash up to 24 hours after the test. This is not dangerous. If this occurs, you can take Benadryl 25 mg and increase your fluid intake. . If you experience trouble breathing, this can be serious. If it is severe call 911 IMMEDIATELY. If it is mild, please call our office.    Once we have confirmed authorization from your insurance company, we will call you to set up a date and time for your test. Based on how quickly your insurance processes prior authorizations requests, please allow up to 4 weeks to be contacted  for scheduling your Cardiac CT appointment. Be advised that routine Cardiac CT appointments could be scheduled as many as 8 weeks after your provider has ordered it.  For non-scheduling related questions, please contact the cardiac imaging nurse navigator should you have any questions/concerns: Marchia Bond, Cardiac Imaging Nurse Navigator Burley Saver, Interim Cardiac Imaging Nurse [place] [facility] Direct Office Dial: [phone]   For scheduling needs, including cancellations and rescheduling, please call [place], [phone].    Follow-Up: At [facility], you and your health needs are our priority.  As part of our continuing mission to provide you with exceptional heart care, we have created designated Provider Care Teams.  These Care Teams include your primary Cardiologist (physician) and Advanced Practice Providers (APPs -  Physician Assistants and Nurse Practitioners) who all work together to provide you with the care you need, when you need it.  We recommend signing up for the patient portal called "MyChart".  Sign up information is provided on this After Visit Summary.  MyChart is used to connect with patients for Virtual Visits (Telemedicine).  Patients are able to view lab/test results, encounter notes, upcoming appointments, etc.  Non-urgent messages can be sent to your provider as well.   To learn more about what you can do with MyChart, go to [email or web address].    Your next appointment:   5 week(s)  The format for your next appointment:   In Person  Provider:   Kate Sable, MD   Other Instructions       Signed, Kate Sable,  MD  01/12/2020 2:59 PM    [place]

## 2020-01-13 LAB — BASIC METABOLIC PANEL
BUN/Creatinine Ratio: 20 (ref 12–28)
BUN: 15 mg/dL (ref 8–27)
CO2: 21 mmol/L (ref 20–29)
Calcium: 9.9 mg/dL (ref 8.7–10.3)
Chloride: 103 mmol/L (ref 96–106)
Creatinine, Ser: 0.76 mg/dL (ref 0.57–1.00)
GFR calc Af Amer: 90 mL/min/{1.73_m2} (ref >59)
GFR calc non Af Amer: 78 mL/min/{1.73_m2} (ref >59)
Glucose: 81 mg/dL (ref 65–99)
Potassium: 4.7 mmol/L (ref 3.5–5.2)
Sodium: 144 mmol/L (ref 134–144)

## 2020-01-15 ENCOUNTER — Ambulatory Visit (INDEPENDENT_AMBULATORY_CARE_PROVIDER_SITE_OTHER): Payer: PPO

## 2020-01-15 ENCOUNTER — Other Ambulatory Visit: Payer: Self-pay

## 2020-01-15 DIAGNOSIS — R072 Precordial pain: Secondary | ICD-10-CM

## 2020-01-15 LAB — ECHOCARDIOGRAM COMPLETE
Area-P 1/2: 3.34 cm2
S' Lateral: 2 cm

## 2020-01-20 ENCOUNTER — Telehealth (HOSPITAL_COMMUNITY): Payer: Self-pay | Admitting: *Deleted

## 2020-01-20 NOTE — Telephone Encounter (Signed)
Pt returning call regarding upcoming cardiac imaging study; pt verbalizes understanding of appt date/time, parking situation and where to check in, pre-test NPO status and medications ordered, and verified current allergies; name and call back number provided for further questions should they arise  Nemiah Bubar RN Navigator Cardiac Imaging [facility] [phone] office [phone] cell  

## 2020-01-20 NOTE — Telephone Encounter (Signed)
Attempted to call patient regarding upcoming cardiac CT appointment. Left message on voicemail with name and callback number  Lenita Peregrina RN Navigator Cardiac Imaging [facility] [phone] Office [phone] Cell  

## 2020-01-22 ENCOUNTER — Other Ambulatory Visit: Payer: Self-pay

## 2020-01-22 ENCOUNTER — Ambulatory Visit
Admission: RE | Admit: 2020-01-22 | Discharge: 2020-01-22 | Disposition: A | Discharge status: Home / Self Care | Payer: PPO | Source: Ambulatory Visit | Attending: Cardiology | Admitting: Cardiology

## 2020-01-22 DIAGNOSIS — R072 Precordial pain: Secondary | ICD-10-CM | POA: Insufficient documentation

## 2020-01-22 MED ORDER — IOHEXOL 350 MG/ML SOLN
75.0000 mL | Freq: Once | INTRAVENOUS | Status: AC | PRN
  Administered 2020-01-22: 75 mL via INTRAVENOUS

## 2020-01-22 MED ORDER — METOPROLOL TARTRATE 5 MG/5ML IV SOLN
10.0000 mg | Freq: Once | INTRAVENOUS | Status: AC
  Administered 2020-01-22: 10 mg via INTRAVENOUS

## 2020-01-22 MED ORDER — NITROGLYCERIN 0.4 MG SL SUBL
0.8000 mg | SUBLINGUAL_TABLET | Freq: Once | SUBLINGUAL | Status: AC
  Administered 2020-01-22: 0.8 mg via SUBLINGUAL

## 2020-01-22 MED ORDER — DILTIAZEM HCL 25 MG/5ML IV SOLN
10.0000 mg | Freq: Once | INTRAVENOUS | Status: AC
  Administered 2020-01-22: 10 mg via INTRAVENOUS

## 2020-01-22 NOTE — Progress Notes (Signed)
Patient tolerated CT well. Drinking water sitting in chair. Vital signs stable encourage to drink water throughout day.Reasons explained and verbalized understanding. Ambulated steady gait.   

## 2020-01-23 ENCOUNTER — Telehealth: Payer: Self-pay

## 2020-01-23 MED ORDER — ASPIRIN EC 81 MG PO TBEC: 81.0000 mg | DELAYED_RELEASE_TABLET | Freq: Every day | ORAL | 3 refills | Status: DC

## 2020-01-23 NOTE — Telephone Encounter (Signed)
   Mild nonobstructive CAD. Recommend starting aspirin 81 mg. Continue Crestor as prescribed.

## 2020-02-05 ENCOUNTER — Encounter (INDEPENDENT_AMBULATORY_CARE_PROVIDER_SITE_OTHER): Payer: Medicare Other | Admitting: Vascular Surgery

## 2020-02-06 ENCOUNTER — Encounter (INDEPENDENT_AMBULATORY_CARE_PROVIDER_SITE_OTHER): Payer: Self-pay | Admitting: Nurse Practitioner

## 2020-02-06 ENCOUNTER — Ambulatory Visit (INDEPENDENT_AMBULATORY_CARE_PROVIDER_SITE_OTHER): Payer: PPO | Admitting: Nurse Practitioner

## 2020-02-06 ENCOUNTER — Other Ambulatory Visit: Payer: Self-pay

## 2020-02-06 VITALS — BP 145/76 | HR 76 | Resp 16 | Ht 62.0 in | Wt 123.8 lb

## 2020-02-06 DIAGNOSIS — G4700 Insomnia, unspecified: Secondary | ICD-10-CM | POA: Insufficient documentation

## 2020-02-06 DIAGNOSIS — I773 Arterial fibromuscular dysplasia: Secondary | ICD-10-CM | POA: Diagnosis not present

## 2020-02-06 DIAGNOSIS — I110 Hypertensive heart disease with heart failure: Secondary | ICD-10-CM | POA: Insufficient documentation

## 2020-02-06 DIAGNOSIS — E782 Mixed hyperlipidemia: Secondary | ICD-10-CM | POA: Diagnosis not present

## 2020-02-06 DIAGNOSIS — I10 Essential (primary) hypertension: Secondary | ICD-10-CM

## 2020-02-06 DIAGNOSIS — I6521 Occlusion and stenosis of right carotid artery: Secondary | ICD-10-CM

## 2020-02-06 DIAGNOSIS — F513 Sleepwalking [somnambulism]: Secondary | ICD-10-CM | POA: Insufficient documentation

## 2020-02-06 DIAGNOSIS — G43109 Migraine with aura, not intractable, without status migrainosus: Secondary | ICD-10-CM | POA: Insufficient documentation

## 2020-02-06 DIAGNOSIS — Z86010 Personal history of colonic polyps: Secondary | ICD-10-CM | POA: Insufficient documentation

## 2020-02-06 DIAGNOSIS — K589 Irritable bowel syndrome without diarrhea: Secondary | ICD-10-CM | POA: Insufficient documentation

## 2020-02-06 DIAGNOSIS — M810 Age-related osteoporosis without current pathological fracture: Secondary | ICD-10-CM | POA: Insufficient documentation

## 2020-02-06 DIAGNOSIS — F5101 Primary insomnia: Secondary | ICD-10-CM | POA: Insufficient documentation

## 2020-02-06 DIAGNOSIS — Z780 Asymptomatic menopausal state: Secondary | ICD-10-CM | POA: Insufficient documentation

## 2020-02-06 DIAGNOSIS — I5189 Other ill-defined heart diseases: Secondary | ICD-10-CM | POA: Insufficient documentation

## 2020-02-06 DIAGNOSIS — N959 Unspecified menopausal and perimenopausal disorder: Secondary | ICD-10-CM | POA: Insufficient documentation

## 2020-02-06 DIAGNOSIS — F322 Major depressive disorder, single episode, severe without psychotic features: Secondary | ICD-10-CM | POA: Insufficient documentation

## 2020-02-06 DIAGNOSIS — J301 Allergic rhinitis due to pollen: Secondary | ICD-10-CM | POA: Insufficient documentation

## 2020-02-06 NOTE — Progress Notes (Signed)
Subjective:    Patient ID: Amber Brooks, female    DOB: August 03, 1945, 75 y.o.   MRN: [id number] Chief Complaint  Patient presents with  . New Patient (Initial Visit)    Ref Etang-Abgor stenosis of right carotid    The patient presents today for evaluation of right carotid artery stenosis.  The patient has a previous history of brain aneurysm which underwent intervention in 2012.  During the angiogram it was found that she had a 50% right internal carotid artery stenosis.  It has not been evaluated since.  The patient denies any CVA or TIA-like symptoms however she does endorse having some blurry vision at times.  This blurry vision have been sporadically.  This is been ongoing since her intervention on her aneurysm.  The patient also notes that following intervention on her aneurysm she did lose her vision for short timeframes.  It was also noted that the patient had fibromuscular dysplasia.  The patient also recently been having some issues with her blood pressure however recent medication changes made her cardiologist have been helping.  She denies any dizziness.  She denies any fever, chills, nausea, vomiting or diarrhea.   Review of Systems  Eyes: Positive for visual disturbance.  All other systems reviewed and are negative.      Objective:   Physical Exam Vitals reviewed.  HENT:     Head: Normocephalic.  Neck:     Vascular: No carotid bruit.  Cardiovascular:     Rate and Rhythm: Normal rate.     Pulses: Normal pulses.  Pulmonary:     Effort: Pulmonary effort is normal.  Skin:    General: Skin is warm and dry.  Neurological:     Mental Status: She is alert and oriented to person, place, and time.  Psychiatric:        Mood and Affect: Mood normal.        Thought Content: Thought content normal.        Judgment: Judgment normal.     BP (!) 145/76 (BP Location: Right Arm)   Pulse 76   Resp 16   Ht 5\' 2"  (1.575 m)   Wt 123 lb 12.8 oz (56.2 kg)   BMI 22.64 kg/m    Past Medical History:  Diagnosis Date  . Anxiety   . 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 Brain aneurysm   . Complication of anesthesia   . Depression   . GERD (gastroesophageal reflux disease)   . Headache(784.0)    migraines  . Hypertension   . Mitral valve prolapse   . PONV (postoperative nausea and vomiting)     Social History   Socioeconomic History  . Marital status: Divorced    Spouse name: Not on file  . Number of children: Not on file  . Years of education: Not on file  . Highest education level: Not on file  Occupational History  . Not on file  Tobacco Use  . Smoking status: Never Smoker  . Smokeless tobacco: Never Used  Vaping Use  . Vaping Use: Never used  Substance and Sexual Activity  . Alcohol use: Yes    Alcohol/week: 1.0 standard drink    Types: 1 Glasses of wine per week    Comment: every evening  . Drug use: No  . Sexual activity: Not Currently  Other Topics Concern  . Not on file  Social History Narrative   Right handed.  Caffeine decaf 1 cup in am.  Divorced.  1 adopted daughter.  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    Social Determinants of Health   Financial Resource Strain: Not on file  Food Insecurity: Not on file  Transportation Needs: Not on file  Physical Activity: Not on file  Stress: Not on file  Social Connections: Not on file  Intimate Partner Violence: Not on file    Past Surgical History:  Procedure Laterality Date  . ABDOMINAL HYSTERECTOMY  1987  . ANEURYSM COILING    . CHOLECYSTECTOMY  2018  . CRANIOTOMY Left 11/15/2012   Procedure: CRANIOTOMY INTRACRANIAL ANEURYSM FOR CAROTID;  Surgeon: Carmela Hurt, MD;  Location: [facility];  Service: Neurosurgery;  Laterality: Left;  LEFT Craniotomy for Aneurysm  . ECTOPIC PREGNANCY SURGERY    . LUMBAR LAMINECTOMY/DECOMPRESSION MICRODISCECTOMY N/A 04/27/2014   Procedure: LUMBAR LAMINECTOMY/DECOMPRESSION MICRODISCECTOMY LUMBAR FOUR-FIVE ;  Surgeon: Barnett Abu, MD;  Location: [facility];  Service:  Neurosurgery;  Laterality: N/A;  . RADIOLOGY WITH ANESTHESIA N/A 10/09/2012   Procedure: RADIOLOGY WITH ANESTHESIA;  Surgeon: Oneal Grout, MD;  Location: [facility];  Service: Radiology;  Laterality: N/A;  . TONSILLECTOMY    . VASCULAR SURGERY     stent placed    Family History  Problem Relation Age of Onset  . Dementia Father   . Dementia Brother     Allergies  Allergen Reactions  . Latex Anaphylaxis  . Codeine Nausea Only  . Hydromorphone Itching    CBC Latest Ref Rng & Units 06/15/2016 04/26/2014 04/07/2014  WBC 3.6 - 11.0 K/uL 6.5 6.1 5.9  Hemoglobin 12.0 - 16.0 g/dL 20.8 13.8 87.1  Hematocrit 35.0 - 47.0 % 40.9 39.7 38.1  Platelets 150 - 440 K/uL 228 266 222      CMP     Component Value Date/Time   NA 144 01/12/2020 1438   K 4.7 01/12/2020 1438   CL 103 01/12/2020 1438   CO2 21 01/12/2020 1438   GLUCOSE 81 01/12/2020 1438   GLUCOSE 87 06/15/2016 1711   BUN 15 01/12/2020 1438   CREATININE 0.76 01/12/2020 1438   CALCIUM 9.9 01/12/2020 1438   PROT 7.3 06/15/2016 1711   ALBUMIN 4.4 06/15/2016 1711   AST 31 06/15/2016 1711   ALT 29 06/15/2016 1711   ALKPHOS 71 06/15/2016 1711   BILITOT 0.7 06/15/2016 1711   GFRNONAA 78 01/12/2020 1438   GFRAA 90 01/12/2020 1438     No results found.     Assessment & Plan:   1. Stenosis of right carotid artery Previous evaluation of her right carotid artery pain via an angiogram when she had her aneurysm clipped.  It has not been evaluated since 2012.  We will have the patient return to the office for carotid artery duplex to evaluate current status of carotid arteries.  Further intervention, if necessary will be planned pending ultrasound results. - VAS US CAROTID; Future  2. Fibromuscular dysplasia (HCC) The patient has a history of fibromuscular dysplasia.  This can also affect renal arteries and given patient's recent issues with blood pressure control it would be prudent to evaluate to establish a baseline.  Patient  will follow up with renal artery duplex at her convenience. - VAS US RENAL ARTERY DUPLEX; Future  3. Mixed hyperlipidemia Continue statin as ordered and reviewed, no changes at this time   4. Primary hypertension Continue antihypertensive medications as already ordered, these medications have been reviewed and there are no changes at this time.    Current Outpatient Medications on File Prior to Visit  Medication Sig  Dispense Refill  . amLODipine (NORVASC) 10 MG tablet Take 10 mg by mouth daily.    Marland Kitchen aspirin EC 81 MG tablet Take 1 tablet (81 mg total) by mouth daily. Swallow whole. 90 tablet 3  . Biotin 5000 MCG CAPS Take 3 capsules by mouth daily.    . carvedilol (COREG) 12.5 MG tablet Take 1 tablet (12.5 mg total) by mouth 2 (two) times daily. 60 tablet 5  . Cholecalciferol (VITAMIN D3 PO) Take 2 tablets by mouth daily.     . Cholecalciferol (VITAMIN D3) 100000 UNIT/GM POWD Take by mouth.    . Cyanocobalamin (VITAMIN B-12 CR) 1000 MCG TBCR Take by mouth.    . esomeprazole (NEXIUM) 40 MG capsule Take 40 mg by mouth daily before breakfast.    . estradiol (ESTRACE) 1 MG tablet Take 0.5 mg by mouth every other day.    . ibuprofen (ADVIL,MOTRIN) 800 MG tablet Take 800 mg by mouth every 8 (eight) hours as needed for headache.    . Magnesium 200 MG TABS Take 200 mg by mouth daily.     . Multiple Vitamin (MULTIVITAMIN WITH MINERALS) TABS tablet Take 2 tablets by mouth daily. Gummy Vitamin    . naproxen (NAPROSYN) 500 MG tablet Take 1 tablet (500 mg total) by mouth 2 (two) times daily. 30 tablet 0  . rosuvastatin (CRESTOR) 20 MG tablet Take 20 mg by mouth daily. As per pt is taking half of 20 mg    . traZODone (DESYREL) 50 MG tablet Take 1 tablet (50 mg total) by mouth at bedtime. 30 tablet 2  . venlafaxine XR (EFFEXOR-XR) 75 MG 24 hr capsule Take 1 capsule (75 mg total) by mouth daily with breakfast. 90 capsule 3  . vitamin C (ASCORBIC ACID) 500 MG tablet Take 500 mg by mouth daily.    .  metoprolol tartrate (LOPRESSOR) 100 MG tablet Take 1 tablet (100 mg total) by mouth once for 1 dose. Take 2 hours prior to your CT scan. 1 tablet 0  . [DISCONTINUED] diphenhydrAMINE (BENADRYL) 25 MG tablet Take 25 mg by mouth daily as needed for allergies.    . [DISCONTINUED] ezetimibe-simvastatin (VYTORIN) 10-40 MG per tablet Take 1 tablet by mouth at bedtime.      No current facility-administered medications on file prior to visit.    There are no Patient Instructions on file for this visit. No follow-ups on file.   Kris Hartmann, NP

## 2020-02-10 ENCOUNTER — Other Ambulatory Visit: Payer: Self-pay

## 2020-02-10 DIAGNOSIS — Z0000 Encounter for general adult medical examination without abnormal findings: Secondary | ICD-10-CM

## 2020-02-10 DIAGNOSIS — E789 Disorder of lipoprotein metabolism, unspecified: Secondary | ICD-10-CM | POA: Insufficient documentation

## 2020-02-10 DIAGNOSIS — E782 Mixed hyperlipidemia: Secondary | ICD-10-CM

## 2020-02-10 DIAGNOSIS — I10 Essential (primary) hypertension: Secondary | ICD-10-CM

## 2020-02-11 ENCOUNTER — Other Ambulatory Visit: Payer: PPO

## 2020-02-11 ENCOUNTER — Other Ambulatory Visit: Payer: Self-pay

## 2020-02-11 DIAGNOSIS — Z0000 Encounter for general adult medical examination without abnormal findings: Secondary | ICD-10-CM

## 2020-02-11 DIAGNOSIS — E782 Mixed hyperlipidemia: Secondary | ICD-10-CM

## 2020-02-11 DIAGNOSIS — I10 Essential (primary) hypertension: Secondary | ICD-10-CM | POA: Diagnosis not present

## 2020-02-12 LAB — CBC WITH DIFFERENTIAL/PLATELET
Absolute Monocytes: 562 cells/uL (ref 200–950)
Basophils Absolute: 51 cells/uL (ref 0–200)
Basophils Relative: 0.7 %
Eosinophils Absolute: 299 cells/uL (ref 15–500)
Eosinophils Relative: 4.1 %
HCT: 40.6 % (ref 35.0–45.0)
Hemoglobin: 13.7 g/dL (ref 11.7–15.5)
Lymphs Abs: 1321 cells/uL (ref 850–3900)
MCH: 31.6 pg (ref 27.0–33.0)
MCHC: 33.7 g/dL (ref 32.0–36.0)
MCV: 93.8 fL (ref 80.0–100.0)
MPV: 11 fL (ref 7.5–12.5)
Monocytes Relative: 7.7 %
Neutro Abs: 5066 cells/uL (ref 1500–7800)
Neutrophils Relative %: 69.4 %
Platelets: 267 10*3/uL (ref 140–400)
RBC: 4.33 10*6/uL (ref 3.80–5.10)
RDW: 11.8 % (ref 11.0–15.0)
Total Lymphocyte: 18.1 %
WBC: 7.3 10*3/uL (ref 3.8–10.8)

## 2020-02-12 LAB — COMPREHENSIVE METABOLIC PANEL
AG Ratio: 1.9 (calc) (ref 1.0–2.5)
ALT: 25 U/L (ref 6–29)
AST: 22 U/L (ref 10–35)
Albumin: 4.6 g/dL (ref 3.6–5.1)
Alkaline phosphatase (APISO): 65 U/L (ref 37–153)
BUN: 18 mg/dL (ref 7–25)
CO2: 29 mmol/L (ref 20–32)
Calcium: 9.5 mg/dL (ref 8.6–10.4)
Chloride: 103 mmol/L (ref 98–110)
Creat: 0.72 mg/dL (ref 0.60–0.93)
Globulin: 2.4 g/dL (calc) (ref 1.9–3.7)
Glucose, Bld: 103 mg/dL — ABNORMAL HIGH (ref 65–99)
Potassium: 4.2 mmol/L (ref 3.5–5.3)
Sodium: 141 mmol/L (ref 135–146)
Total Bilirubin: 0.5 mg/dL (ref 0.2–1.2)
Total Protein: 7 g/dL (ref 6.1–8.1)

## 2020-02-12 LAB — LIPID PANEL
Cholesterol: 176 mg/dL (ref <200)
HDL: 82 mg/dL (ref >=50)
LDL Cholesterol (Calc): 77 mg/dL (calc)
Non-HDL Cholesterol (Calc): 94 mg/dL (calc) (ref <130)
Total CHOL/HDL Ratio: 2.1 (calc) (ref <5.0)
Triglycerides: 90 mg/dL (ref <150)

## 2020-02-12 LAB — HEMOGLOBIN A1C
Hgb A1c MFr Bld: 5.1 % of total Hgb (ref <5.7)
Mean Plasma Glucose: 100 mg/dL
eAG (mmol/L): 5.5 mmol/L

## 2020-02-12 LAB — TSH: TSH: 1.22 mIU/L (ref 0.40–4.50)

## 2020-02-16 ENCOUNTER — Ambulatory Visit: Payer: PPO | Admitting: Cardiology

## 2020-02-16 ENCOUNTER — Encounter: Payer: Self-pay | Admitting: Cardiology

## 2020-02-16 ENCOUNTER — Other Ambulatory Visit: Payer: Self-pay

## 2020-02-16 VITALS — BP 148/72 | HR 77 | Ht 62.0 in | Wt 118.0 lb

## 2020-02-16 DIAGNOSIS — R079 Chest pain, unspecified: Secondary | ICD-10-CM | POA: Diagnosis not present

## 2020-02-16 DIAGNOSIS — E7800 Pure hypercholesterolemia, unspecified: Secondary | ICD-10-CM

## 2020-02-16 DIAGNOSIS — I10 Essential (primary) hypertension: Secondary | ICD-10-CM | POA: Diagnosis not present

## 2020-02-16 MED ORDER — CARVEDILOL 25 MG PO TABS: 25.0000 mg | ORAL_TABLET | Freq: Two times a day (BID) | ORAL | 5 refills | Status: DC

## 2020-02-16 NOTE — Patient Instructions (Signed)
Medication Instructions:   1.  INCREASE your Coreg (Carvedilol) to 25 MG twice daily.  *If you need a refill on your cardiac medications before your next appointment, please call your pharmacy*   Lab Work: None ordered If you have labs (blood work) drawn today and your tests are completely normal, you will receive your results only by: Marland Kitchen MyChart Message (if you have MyChart) OR . A paper copy in the mail If you have any lab test that is abnormal or we need to change your treatment, we will call you to review the results.   Testing/Procedures: None ordered   Follow-Up: At [facility], you and your health needs are our priority.  As part of our continuing mission to provide you with exceptional heart care, we have created designated Provider Care Teams.  These Care Teams include your primary Cardiologist (physician) and Advanced Practice Providers (APPs -  Physician Assistants and Nurse Practitioners) who all work together to provide you with the care you need, when you need it.  We recommend signing up for the patient portal called "MyChart".  Sign up information is provided on this After Visit Summary.  MyChart is used to connect with patients for Virtual Visits (Telemedicine).  Patients are able to view lab/test results, encounter notes, upcoming appointments, etc.  Non-urgent messages can be sent to your provider as well.   To learn more about what you can do with MyChart, go to [email or web address].    Your next appointment:   6 month(s)  The format for your next appointment:   In Person  Provider:   Kate Sable, MD   Other Instructions

## 2020-02-16 NOTE — Progress Notes (Signed)
Cardiology Office Note:    Date:  02/16/2020   ID:  Amber Brooks, DOB 1945/03/15, MRN [id number]  PCP:  Olin Hauser, DO  [place] Cardiologist:  No primary care provider on file.  [facility] [facility] Electrophysiologist:  None   Referring MD: Nobie Putnam *   Chief Complaint  Patient presents with  . 5 week follow up    Discuss Echo and CT results. Medications reviewed by the patient verbally.     History of Present Illness:    Amber Brooks is a 75 y.o. female with a hx of hypertension, hyperlipidemia, brain aneurysm status post clipping who presents for follow-up.  Patient was previously seen due to chest pain.  Symptoms of chest discomfort occurred after receivingCOVID-19 booster.  Due to risk factors family history, an echocardiogram and coronary CTA was ordered to evaluate patient's symptoms.  She states her symptoms of chest discomfort and palpitations have improved since starting carvedilol.  Her blood pressure is still elevated with systolics in the 973Z to 329J.  She otherwise feels okay, has no concerns at this time.  Prior notes Transthoracic echo 04/2014 showed normal systolic function, EF 60 to 65%, impaired relaxation. Lexiscan Myoview 2016 with no evidence of ischemia.  Past Medical History:  Diagnosis Date  . Anxiety   . 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 Brain aneurysm   . Complication of anesthesia   . Depression   . GERD (gastroesophageal reflux disease)   . Headache(784.0)    migraines  . Hypertension   . Mitral valve prolapse   . PONV (postoperative nausea and vomiting)     Past Surgical History:  Procedure Laterality Date  . ABDOMINAL HYSTERECTOMY  1987  . ANEURYSM COILING    . CHOLECYSTECTOMY  2018  . CRANIOTOMY Left 11/15/2012   Procedure: CRANIOTOMY INTRACRANIAL ANEURYSM FOR CAROTID;  Surgeon: Winfield Cunas, MD;  Location: [facility];  Service: Neurosurgery;  Laterality: Left;  LEFT Craniotomy for Aneurysm  . ECTOPIC PREGNANCY  SURGERY    . LUMBAR LAMINECTOMY/DECOMPRESSION MICRODISCECTOMY N/A 04/27/2014   Procedure: LUMBAR LAMINECTOMY/DECOMPRESSION MICRODISCECTOMY LUMBAR FOUR-FIVE ;  Surgeon: Kristeen Miss, MD;  Location: [place] [facility];  Service: Neurosurgery;  Laterality: N/A;  . RADIOLOGY WITH ANESTHESIA N/A 10/09/2012   Procedure: RADIOLOGY WITH ANESTHESIA;  Surgeon: Rob Hickman, MD;  Location: [place];  Service: Radiology;  Laterality: N/A;  . TONSILLECTOMY    . VASCULAR SURGERY     stent placed    Current Medications: Current Meds  Medication Sig  . amLODipine (NORVASC) 10 MG tablet Take 10 mg by mouth daily.  Marland Kitchen aspirin EC 81 MG tablet Take 1 tablet (81 mg total) by mouth daily. Swallow whole.  . Biotin 5000 MCG CAPS Take 3 capsules by mouth daily.  . Cholecalciferol (VITAMIN D3 PO) Take 2 tablets by mouth daily.   . Cholecalciferol (VITAMIN D3) 100000 UNIT/GM POWD Take by mouth.  . Cyanocobalamin (VITAMIN B-12 CR) 1000 MCG TBCR Take by mouth.  . esomeprazole (NEXIUM) 40 MG capsule Take 40 mg by mouth daily before breakfast.  . estradiol (ESTRACE) 1 MG tablet Take 0.5 mg by mouth every other day.  . ibuprofen (ADVIL,MOTRIN) 800 MG tablet Take 800 mg by mouth every 8 (eight) hours as needed for headache.  . Magnesium 200 MG TABS Take 200 mg by mouth daily.   . Multiple Vitamin (MULTIVITAMIN WITH MINERALS) TABS tablet Take 2 tablets by mouth daily. Gummy Vitamin  . naproxen (NAPROSYN) 500 MG tablet Take 1 tablet (  500 mg total) by mouth 2 (two) times daily.  . rosuvastatin (CRESTOR) 20 MG tablet Take 20 mg by mouth daily. As per pt is taking half of 20 mg  . traZODone (DESYREL) 50 MG tablet Take 1 tablet (50 mg total) by mouth at bedtime.  Marland Kitchen venlafaxine XR (EFFEXOR-XR) 75 MG 24 hr capsule Take 1 capsule (75 mg total) by mouth daily with breakfast.  . vitamin C (ASCORBIC ACID) 500 MG tablet Take 500 mg by mouth daily.  . [DISCONTINUED] carvedilol (COREG) 12.5 MG tablet Take 1 tablet (12.5 mg total) by mouth  2 (two) times daily.     Allergies:   Latex, Codeine, and Hydromorphone   Social History   Socioeconomic History  . Marital status: Divorced    Spouse name: Not on file  . Number of children: Not on file  . Years of education: Not on file  . Highest education level: Not on file  Occupational History  . Not on file  Tobacco Use  . Smoking status: Never Smoker  . Smokeless tobacco: Never Used  Vaping Use  . Vaping Use: Never used  Substance and Sexual Activity  . Alcohol use: Yes    Alcohol/week: 1.0 standard drink    Types: 1 Glasses of wine per week    Comment: every evening  . Drug use: No  . Sexual activity: Not Currently  Other Topics Concern  . Not on file  Social History Narrative   Right handed.  Caffeine decaf 1 cup in am.  Divorced.  1 adopted daughter.  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    Social Determinants of Health   Financial Resource Strain: Not on file  Food Insecurity: Not on file  Transportation Needs: Not on file  Physical Activity: Not on file  Stress: Not on file  Social Connections: Not on file     Family History: The patient's family history includes Dementia in her brother and father.  ROS:   Please see the history of present illness.     All other systems reviewed and are negative.  EKGs/Labs/Other Studies Reviewed:    The following studies were reviewed today:   EKG:  EKG not ordered today.   Recent Labs: 02/11/2020: ALT 25; BUN 18; Creat 0.72; Hemoglobin 13.7; Platelets 267; Potassium 4.2; Sodium 141; TSH 1.22  Recent Lipid Panel    Component Value Date/Time   CHOL 176 02/11/2020 0852   TRIG 90 02/11/2020 0852   HDL 82 02/11/2020 0852   CHOLHDL 2.1 02/11/2020 0852   VLDL 33 04/07/2014 0425   LDLCALC 77 02/11/2020 0852     Risk Assessment/Calculations:      Physical Exam:    VS:  BP (!) 148/72 (BP Location: Left Arm, Patient Position: Sitting, Cuff Size: Normal)   Pulse 77   Ht 5\' 2"  (1.575 m)   Wt 118 lb (53.5 kg)   SpO2 98%    BMI 21.58 kg/m     Wt Readings from Last 3 Encounters:  02/16/20 118 lb (53.5 kg)  02/06/20 123 lb 12.8 oz (56.2 kg)  01/12/20 120 lb (54.4 kg)     GEN:  Well nourished, well developed in no acute distress HEENT: Normal NECK: No JVD; No carotid bruits LYMPHATICS: No lymphadenopathy CARDIAC: RRR, no murmurs, rubs, gallops RESPIRATORY:  Clear to auscultation without rales, wheezing or rhonchi  ABDOMEN: Soft, non-tender, non-distended MUSCULOSKELETAL:  No edema; No deformity  SKIN: Warm and dry NEUROLOGIC:  Alert and oriented x 3 PSYCHIATRIC:  Normal affect  ASSESSMENT:    1. Chest pain of uncertain etiology   2. Primary hypertension   3. Pure hypercholesterolemia    PLAN:    In order of problems listed above:  1. Chest pain, risk factors of age, hypertension, hyperlipidemia.  Echo 01/2020 showed normal systolic function, impaired relaxation, 55 to 60%.  Coronary CT showed a calcium score of 168, mild calcified plaque mid LAD, 25 to 49% stenosis.  Start aspirin 81 mg, continue Crestor.  LDL reasonably controlled at 77 2. History of hypertension, BP still elevated.  Increase Coreg to 25 mg twice daily 3. Hyperlipidemia, cholesterol controlled continue Crestor.     Follow-up in 6 months    Medication Adjustments/Labs and Tests Ordered: Current medicines are reviewed at length with the patient today.  Concerns regarding medicines are outlined above.  No orders of the defined types were placed in this encounter.  Meds ordered this encounter  Medications  . carvedilol (COREG) 25 MG tablet    Sig: Take 1 tablet (25 mg total) by mouth 2 (two) times daily.    Dispense:  60 tablet    Refill:  5    Patient Instructions  Medication Instructions:   1.  INCREASE your Coreg (Carvedilol) to 25 MG twice daily.  *If you need a refill on your cardiac medications before your next appointment, please call your pharmacy*   Lab Work: None ordered If you have labs (blood work)  drawn today and your tests are completely normal, you will receive your results only by: Marland Kitchen MyChart Message (if you have MyChart) OR . A paper copy in the mail If you have any lab test that is abnormal or we need to change your treatment, we will call you to review the results.   Testing/Procedures: None ordered   Follow-Up: At [facility], you and your health needs are our priority.  As part of our continuing mission to provide you with exceptional heart care, we have created designated Provider Care Teams.  These Care Teams include your primary Cardiologist (physician) and Advanced Practice Providers (APPs -  Physician Assistants and Nurse Practitioners) who all work together to provide you with the care you need, when you need it.  We recommend signing up for the patient portal called "MyChart".  Sign up information is provided on this After Visit Summary.  MyChart is used to connect with patients for Virtual Visits (Telemedicine).  Patients are able to view lab/test results, encounter notes, upcoming appointments, etc.  Non-urgent messages can be sent to your provider as well.   To learn more about what you can do with MyChart, go to [email or web address].    Your next appointment:   6 month(s)  The format for your next appointment:   In Person  Provider:   Kate Sable, MD   Other Instructions      Signed, Kate Sable, MD  02/16/2020 4:40 PM    [place]

## 2020-02-18 ENCOUNTER — Encounter: Payer: Self-pay | Admitting: Family Medicine

## 2020-02-18 ENCOUNTER — Other Ambulatory Visit: Payer: Self-pay | Admitting: Family Medicine

## 2020-02-18 ENCOUNTER — Other Ambulatory Visit: Payer: Self-pay

## 2020-02-18 ENCOUNTER — Ambulatory Visit (INDEPENDENT_AMBULATORY_CARE_PROVIDER_SITE_OTHER): Payer: PPO | Admitting: Family Medicine

## 2020-02-18 VITALS — BP 129/53 | HR 73 | Temp 97.6°F | Resp 18 | Ht 62.0 in | Wt 123.0 lb

## 2020-02-18 DIAGNOSIS — G43109 Migraine with aura, not intractable, without status migrainosus: Secondary | ICD-10-CM

## 2020-02-18 DIAGNOSIS — R519 Headache, unspecified: Secondary | ICD-10-CM

## 2020-02-18 DIAGNOSIS — R7309 Other abnormal glucose: Secondary | ICD-10-CM

## 2020-02-18 DIAGNOSIS — I10 Essential (primary) hypertension: Secondary | ICD-10-CM | POA: Diagnosis not present

## 2020-02-18 DIAGNOSIS — M5416 Radiculopathy, lumbar region: Secondary | ICD-10-CM

## 2020-02-18 DIAGNOSIS — Z0000 Encounter for general adult medical examination without abnormal findings: Secondary | ICD-10-CM | POA: Diagnosis not present

## 2020-02-18 DIAGNOSIS — F3342 Major depressive disorder, recurrent, in full remission: Secondary | ICD-10-CM

## 2020-02-18 DIAGNOSIS — Z1231 Encounter for screening mammogram for malignant neoplasm of breast: Secondary | ICD-10-CM | POA: Diagnosis not present

## 2020-02-18 DIAGNOSIS — E782 Mixed hyperlipidemia: Secondary | ICD-10-CM | POA: Diagnosis not present

## 2020-02-18 DIAGNOSIS — I110 Hypertensive heart disease with heart failure: Secondary | ICD-10-CM

## 2020-02-18 DIAGNOSIS — Z1159 Encounter for screening for other viral diseases: Secondary | ICD-10-CM

## 2020-02-18 MED ORDER — ROSUVASTATIN CALCIUM 10 MG PO TABS
10.0000 mg | ORAL_TABLET | Freq: Every day | ORAL | 3 refills | Status: DC
Start: 2020-02-18 — End: 2021-02-05

## 2020-02-18 MED ORDER — IBUPROFEN 800 MG PO TABS: 800.0000 mg | ORAL_TABLET | Freq: Three times a day (TID) | ORAL | 2 refills | Status: DC | PRN

## 2020-02-18 NOTE — Patient Instructions (Addendum)
Thank you for coming to the office today.  Last Colonoscopy done 2019 - we did not receive a copy of it, please try to locate a copy or request a new one to be sent to [place] for your chart.  Also send [place] a picture of your COVID card  Stay tuned for Mammogram - call with apt - from [place] - by the hospital.  Leg cramps - Try spoonful of yellow mustard to relieve leg cramps or try daily to prevent the problem  - OTC natural option is Hyland's Leg Cramps (Dissolving tablet) take as needed for muscle cramps  Recommend return to Dr Ellene Route for possible back injection if needed.  DUE for FASTING BLOOD WORK (no food or drink after midnight before the lab appointment, only water or coffee without cream/sugar on the morning of)  SCHEDULE "Lab Only" visit in the morning at the clinic for lab draw in 1 YEAR  - Make sure Lab Only appointment is at about 1 week before your next appointment, so that results will be available  For Lab Results, once available within 2-3 days of blood draw, you can can log in to MyChart online to view your results and a brief explanation. Also, we can discuss results at next follow-up visit.    Please schedule a Follow-up Appointment to: Return in about 6 months (around 08/17/2020) for 1 year fasting lab only then 1 week later Annual Physical.  If you have any other questions or concerns, please feel free to call the office or send a message through [place]. You may also schedule an earlier appointment if necessary.  Additionally, you may be receiving a survey about your experience at our office within a few days to 1 week by e-mail or mail. We value your feedback.  Amber Putnam, DO [employer]

## 2020-02-18 NOTE — Telephone Encounter (Signed)
Copied from [place] [id number]. Topic: Medical Record Request - Patient ROI Request >> Feb 18, 2020  2:06 PM Tessa Lerner A wrote: Patient has made contact requesting that records related to her colonoscopy be faxed  to [facility] Phone: [phone] Fax : [phone]  Patient would also like to be notified on completion of this request.

## 2020-02-18 NOTE — Progress Notes (Signed)
Subjective:    Patient ID: Amber Brooks, female    DOB: [birth year], 75 y.o.   MRN: [id number]  Amber Brooks is a 75 y.o. female presenting on 02/18/2020 for Annual Exam   HPI   Here for Annual Physical and Lab Review.  UTI S/p Cholecystectomy in 2018 Chronic Diarrhea episodic Reports dysuria significant pain with urination 4-5 days now, has had recurrent similar episodes in past due to trigger from diarrhea from chronic digestive problems. Takes OTC AZO She takes Total Restore for gut health supplement. Denies any hematuria, odor  Intermittent palpitations Syncopal Episode Chest Tightness Persistent issues following 10/2019 COVID Booster vaccine She is followed by [facility] now.  CHRONIC HTN: Reports history of elevated BP had been controlled Current Meds - Amlodipine 10mg  daily, Coreg 25mg  BID (recent change 2 days ago) Reports good compliance, took meds today. Tolerating well, w/o complaints. Denies CP, dyspnea, HA, edema, dizziness / lightheadedness  HYPERLIPIDEMIA: - Reports no concerns - Currently taking Rosuvastatin 20mg  nightly, tolerating well without side effects or myalgias Weight consistently at home 118-119 lbs, here with all clothing etc is up to 120-123 lbs.  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 She had remote history years ago about taking too many Xanax pills - Admits insomnia issue with waking up overnight in past, she has tried MIrtazapine and then recently Trazodone, difficulty maintaining sleep, she will wake up overnight then difficulty falling back asleep  Leg Cramps Nocturnal episodic  History of Low Back Pain S/p surgery Lumbar Laminectomy decompression 04/2014 Dr Ellene Route She has done PT in past about 2-3 years ago She still has episodes of R lower back pain.   Health  Maintenance: UTD COVID Vaccine needs copy of booster. Colonoscopy reported 2019 [facility] will need record. Will try again to locate record she may have copy Mammogram 2015. Overdue - she will like to have order at [place] soon.  Depression screen [facility] 2/9 12/18/2019  Decreased Interest 0  Down, Depressed, Hopeless 0  PHQ - 2 Score 0  Altered sleeping 1  Tired, decreased energy 0  Change in appetite 0  Feeling bad or failure about yourself  0  Trouble concentrating 0  Moving slowly or fidgety/restless 0  Suicidal thoughts 0  PHQ-9 Score 1  Difficult doing work/chores Not difficult at all    Past Medical History:  Diagnosis Date  . Anxiety   . 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 Brain aneurysm   . Complication of anesthesia   . Depression   . GERD (gastroesophageal reflux disease)   . Headache(784.0)    migraines  . Hypertension   . Mitral valve prolapse   . PONV (postoperative nausea and vomiting)    Past Surgical History:  Procedure Laterality Date  . ABDOMINAL HYSTERECTOMY  1987  . ANEURYSM COILING    . CHOLECYSTECTOMY  2018  . CRANIOTOMY Left 11/15/2012   Procedure: CRANIOTOMY INTRACRANIAL ANEURYSM FOR CAROTID;  Surgeon: Winfield Cunas, MD;  Location: [place] [facility];  Service: Neurosurgery;  Laterality: Left;  LEFT Craniotomy for Aneurysm  . ECTOPIC PREGNANCY SURGERY    . LUMBAR LAMINECTOMY/DECOMPRESSION MICRODISCECTOMY N/A 04/27/2014   Procedure: LUMBAR LAMINECTOMY/DECOMPRESSION MICRODISCECTOMY LUMBAR FOUR-FIVE ;  Surgeon: Kristeen Miss, MD;  Location: [place] [facility];  Service: Neurosurgery;  Laterality: N/A;  . RADIOLOGY WITH ANESTHESIA N/A 10/09/2012   Procedure: RADIOLOGY WITH ANESTHESIA;  Surgeon: Rob Hickman, MD;  Location: [place];  Service: Radiology;  Laterality: N/A;  . TONSILLECTOMY    . VASCULAR SURGERY     stent placed   Social History   Socioeconomic History  . Marital status: Divorced    Spouse name: Not on file  . Number of children: Not on file   . Years of education: Not on file  . Highest education level: Not on file  Occupational History  . Not on file  Tobacco Use  . Smoking status: Never Smoker  . Smokeless tobacco: Never Used  Vaping Use  . Vaping Use: Never used  Substance and Sexual Activity  . Alcohol use: Yes    Alcohol/week: 1.0 standard drink    Types: 1 Glasses of wine per week    Comment: every evening  . Drug use: No  . Sexual activity: Not Currently  Other Topics Concern  . Not on file  Social History Narrative   Right handed.  Caffeine decaf 1 cup in am.  Divorced.  1 adopted daughter.  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    Social Determinants of Health   Financial Resource Strain: Not on file  Food Insecurity: Not on file  Transportation Needs: Not on file  Physical Activity: Not on file  Stress: Not on file  Social Connections: Not on file  Intimate Partner Violence: Not on file   Family History  Problem Relation Age of Onset  . Dementia Father   . Dementia Brother    Current Outpatient Medications on File Prior to Visit  Medication Sig  . Acetylcarnitine HCl (ACETYL L-CARNITINE) 500 MG CAPS Take by mouth.  . ALOE PO Take by mouth.  Marland Kitchen amLODipine (NORVASC) 10 MG tablet Take 10 mg by mouth daily.  Marland Kitchen aspirin EC 81 MG tablet Take 1 tablet (81 mg total) by mouth daily. Swallow whole.  [place]ong [place] Oil-GLA-Linoleic Acid (BORAGE PO) Take by mouth.  . carvedilol (COREG) 25 MG tablet Take 1 tablet (25 mg total) by mouth 2 (two) times daily.  Marland Kitchen esomeprazole (NEXIUM) 40 MG capsule Take 40 mg by mouth daily before breakfast.  . Magnesium 200 MG TABS Take 200 mg by mouth daily.   . Multiple Vitamin (MULTI-VITAMIN DAILY PO) Take by mouth.  . Multiple Vitamin (MULTIVITAMIN WITH MINERALS) TABS tablet Take 2 tablets by mouth daily. Gummy Vitamin  . NON FORMULARY Take 1 capsule by mouth daily. Tru Niagen  . NON FORMULARY Take 1 capsule by mouth daily. Cordyceps  . Omega 3 1000 MG CAPS Take by mouth.  . Probiotic Product  (PROBIOTIC-10 PO) Take by mouth.  . SPIRULINA PO Take by mouth.  . traZODone (DESYREL) 50 MG tablet Take 1 tablet (50 mg total) by mouth at bedtime.  Marland Kitchen venlafaxine XR (EFFEXOR-XR) 75 MG 24 hr capsule Take 1 capsule (75 mg total) by mouth daily with breakfast.  . naproxen (NAPROSYN) 500 MG tablet Take 1 tablet (500 mg total) by mouth 2 (two) times daily. (Patient not taking: Reported on 02/18/2020)  . vitamin C (ASCORBIC ACID) 500 MG tablet Take 500 mg by mouth daily. (Patient not taking: Reported on 02/18/2020)  . [DISCONTINUED] diphenhydrAMINE (BENADRYL) 25 MG tablet Take 25 mg by mouth daily as needed for allergies.  . [DISCONTINUED] ezetimibe-simvastatin (VYTORIN) 10-40 MG per tablet Take 1 tablet by mouth at bedtime.    No current facility-administered medications on file prior to visit.    Review of Systems  Constitutional: Negative for activity change, appetite change, chills, diaphoresis,  fatigue and fever.  HENT: Negative for congestion and hearing loss.   Eyes: Negative for visual disturbance.  Respiratory: Negative for cough, chest tightness, shortness of breath and wheezing.   Cardiovascular: Negative for chest pain, palpitations and leg swelling.  Gastrointestinal: Negative for abdominal pain, constipation, diarrhea, nausea and vomiting.  Genitourinary: Negative for dysuria, frequency and hematuria.  Musculoskeletal: Negative for arthralgias and neck pain.  Skin: Negative for rash.  Allergic/Immunologic: Negative for environmental allergies.  Neurological: Negative for dizziness, weakness, light-headedness, numbness and headaches.  Hematological: Negative for adenopathy.  Psychiatric/Behavioral: Negative for behavioral problems, dysphoric mood and sleep disturbance.   Per HPI unless specifically indicated above      Objective:    BP (!) 129/53 (BP Location: Left Arm, Patient Position: Sitting, Cuff Size: Normal)   Pulse 73   Temp 97.6 F (36.4 C) (Temporal)   Resp 18    Ht 5\' 2"  (1.575 m)   Wt 123 lb (55.8 kg)   SpO2 99%   BMI 22.50 kg/m   Wt Readings from Last 3 Encounters:  02/18/20 123 lb (55.8 kg)  02/16/20 118 lb (53.5 kg)  02/06/20 123 lb 12.8 oz (56.2 kg)    Physical Exam Vitals and nursing note reviewed.  Constitutional:      General: She is not in acute distress.    Appearance: She is well-developed and well-nourished. She is not diaphoretic.     Comments: Well-appearing, comfortable, cooperative  HENT:     Head: Normocephalic and atraumatic.     Mouth/Throat:     Mouth: Oropharynx is clear and moist.  Eyes:     General:        Right eye: No discharge.        Left eye: No discharge.     Extraocular Movements: EOM normal.     Conjunctiva/sclera: Conjunctivae normal.     Pupils: Pupils are equal, round, and reactive to light.  Neck:     Thyroid: No thyromegaly.  Cardiovascular:     Rate and Rhythm: Normal rate and regular rhythm.     Pulses: Intact distal pulses.     Heart sounds: Normal heart sounds. No murmur heard.   Pulmonary:     Effort: Pulmonary effort is normal. No respiratory distress.     Breath sounds: Normal breath sounds. No wheezing or rales.  Abdominal:     General: Bowel sounds are normal. There is no distension.     Palpations: Abdomen is soft. There is no mass.     Tenderness: There is no abdominal tenderness.  Musculoskeletal:        General: No tenderness or edema. Normal range of motion.     Cervical back: Normal range of motion and neck supple.     Right lower leg: No edema.     Left lower leg: No edema.     Comments: Upper / Lower Extremities: - Normal muscle tone, strength bilateral upper extremities 5/5, lower extremities 5/5  Lymphadenopathy:     Cervical: No cervical adenopathy.  Skin:    General: Skin is warm and dry.     Findings: No erythema or rash.  Neurological:     Mental Status: She is alert and oriented to person, place, and time.     Comments: Distal sensation intact to light touch  all extremities  Psychiatric:        Mood and Affect: Mood and affect normal.        Behavior: Behavior normal.  Comments: Well groomed, good eye contact, normal speech and thoughts    Results for orders placed or performed in visit on 02/11/20  CBC with Differential/Platelet  Result Value Ref Range   WBC 7.3 3.8 - 10.8 Thousand/uL   RBC 4.33 3.80 - 5.10 Million/uL   Hemoglobin 13.7 11.7 - 15.5 g/dL   HCT 40.6 35.0 - 45.0 %   MCV 93.8 80.0 - 100.0 fL   MCH 31.6 27.0 - 33.0 pg   MCHC 33.7 32.0 - 36.0 g/dL   RDW 11.8 11.0 - 15.0 %   Platelets 267 140 - 400 Thousand/uL   MPV 11.0 7.5 - 12.5 fL   Neutro Abs 5,066 1,500 - 7,800 cells/uL   Lymphs Abs 1,321 850 - 3,900 cells/uL   Absolute Monocytes 562 200 - 950 cells/uL   Eosinophils Absolute 299 15 - 500 cells/uL   Basophils Absolute 51 0 - 200 cells/uL   Neutrophils Relative % 69.4 %   Total Lymphocyte 18.1 %   Monocytes Relative 7.7 %   Eosinophils Relative 4.1 %   Basophils Relative 0.7 %  Lipid panel  Result Value Ref Range   Cholesterol 176 <200 mg/dL   HDL 82 > OR = 50 mg/dL   Triglycerides 90 <150 mg/dL   LDL Cholesterol (Calc) 77 mg/dL (calc)   Total CHOL/HDL Ratio 2.1 <5.0 (calc)   Non-HDL Cholesterol (Calc) 94 <130 mg/dL (calc)  Hemoglobin A1c  Result Value Ref Range   Hgb A1c MFr Bld 5.1 <5.7 % of total Hgb   Mean Plasma Glucose 100 mg/dL   eAG (mmol/L) 5.5 mmol/L  TSH  Result Value Ref Range   TSH 1.22 0.40 - 4.50 mIU/L  Comprehensive metabolic panel  Result Value Ref Range   Glucose, Bld 103 (H) 65 - 99 mg/dL   BUN 18 7 - 25 mg/dL   Creat 0.72 0.60 - 0.93 mg/dL   BUN/Creatinine Ratio NOT APPLICABLE 6 - 22 (calc)   Sodium 141 135 - 146 mmol/L   Potassium 4.2 3.5 - 5.3 mmol/L   Chloride 103 98 - 110 mmol/L   CO2 29 20 - 32 mmol/L   Calcium 9.5 8.6 - 10.4 mg/dL   Total Protein 7.0 6.1 - 8.1 g/dL   Albumin 4.6 3.6 - 5.1 g/dL   Globulin 2.4 1.9 - 3.7 g/dL (calc)   AG Ratio 1.9 1.0 - 2.5 (calc)    Total Bilirubin 0.5 0.2 - 1.2 mg/dL   Alkaline phosphatase (APISO) 65 37 - 153 U/L   AST 22 10 - 35 U/L   ALT 25 6 - 29 U/L      Assessment & Plan:   Problem List Items Addressed This Visit    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)   Mixed hyperlipidemia   Relevant Medications   rosuvastatin (CRESTOR) 10 MG tablet   Migraine with aura   Relevant Medications   rosuvastatin (CRESTOR) 10 MG tablet   ibuprofen (ADVIL) 800 MG tablet   Lumbar radiculopathy   Relevant Medications   Acetylcarnitine HCl (ACETYL L-CARNITINE) 500 MG CAPS   Hypertensive heart failure (HCC)   Relevant Medications   rosuvastatin (CRESTOR) 10 MG tablet   Hypertension   Relevant Medications   rosuvastatin (CRESTOR) 10 MG tablet    Other Visit Diagnoses    Annual physical exam    -  Primary   Nonintractable headache, unspecified chronicity pattern, unspecified headache type       Relevant Medications   ibuprofen (ADVIL) 800 MG  tablet      Updated Health Maintenance information Reviewed recent lab results with patient Encouraged improvement to lifestyle with diet and exercise A1c 5.1 normal range, borderline elevated fasting CBG at 103, not significant. LDL Controlled on Rosuvastatin - re order lower dose as she is taking 10mg  Goal maintain healthy weight Chronic headaches/migraine - will re order Ibuprofen for PRN usage, use with caution.   Meds ordered this encounter  Medications  . rosuvastatin (CRESTOR) 10 MG tablet    Sig: Take 1 tablet (10 mg total) by mouth at bedtime.    Dispense:  90 tablet    Refill:  3  . ibuprofen (ADVIL) 800 MG tablet    Sig: Take 1 tablet (800 mg total) by mouth every 8 (eight) hours as needed for headache.    Dispense:  90 tablet    Refill:  2      Follow up plan: Return in about 1 year (around 02/17/2021) for 1 year fasting lab only then 1 week later Annual Physical.   Future labs 02/14/21  Nobie Putnam, [facility] 02/18/2020, 9:24 AM

## 2020-02-25 ENCOUNTER — Ambulatory Visit (INDEPENDENT_AMBULATORY_CARE_PROVIDER_SITE_OTHER): Payer: PPO

## 2020-02-25 ENCOUNTER — Other Ambulatory Visit: Payer: Self-pay

## 2020-02-25 ENCOUNTER — Ambulatory Visit (INDEPENDENT_AMBULATORY_CARE_PROVIDER_SITE_OTHER): Payer: PPO | Admitting: Nurse Practitioner

## 2020-02-25 ENCOUNTER — Encounter (INDEPENDENT_AMBULATORY_CARE_PROVIDER_SITE_OTHER): Payer: Self-pay | Admitting: Nurse Practitioner

## 2020-02-25 VITALS — BP 173/77 | HR 77 | Ht 62.0 in | Wt 121.0 lb

## 2020-02-25 DIAGNOSIS — I6521 Occlusion and stenosis of right carotid artery: Secondary | ICD-10-CM | POA: Diagnosis not present

## 2020-02-25 DIAGNOSIS — I10 Essential (primary) hypertension: Secondary | ICD-10-CM | POA: Diagnosis not present

## 2020-02-25 DIAGNOSIS — I701 Atherosclerosis of renal artery: Secondary | ICD-10-CM

## 2020-02-25 DIAGNOSIS — I773 Arterial fibromuscular dysplasia: Secondary | ICD-10-CM | POA: Diagnosis not present

## 2020-02-25 DIAGNOSIS — E782 Mixed hyperlipidemia: Secondary | ICD-10-CM

## 2020-02-26 ENCOUNTER — Telehealth (INDEPENDENT_AMBULATORY_CARE_PROVIDER_SITE_OTHER): Payer: Self-pay

## 2020-02-26 NOTE — Telephone Encounter (Signed)
Patient called back and is now scheduled with Dr. Delana Meyer for a renal angio on 03/09/20 with a 1:00 pm arrival time to the [facility]. Covid testing on 03/05/20 between 8-1 pm at the [place]. Pre-procedure instructions were discussed and will be mailed.

## 2020-02-26 NOTE — Telephone Encounter (Signed)
I attempted to contact the patient and a message was left for a return call. 

## 2020-02-29 ENCOUNTER — Encounter (INDEPENDENT_AMBULATORY_CARE_PROVIDER_SITE_OTHER): Payer: Self-pay | Admitting: Nurse Practitioner

## 2020-02-29 NOTE — Progress Notes (Signed)
Subjective:    Patient ID: Amber Brooks, female    DOB: 04/17/1945, 75 y.o.   MRN: [id number] Chief Complaint  Patient presents with  . Follow-up    Pt conv. Renal carotid     Amber Brooks is a 75 year old female that presents today for follow-up evaluation of carotid artery stenosis in addition to renal artery stenosis.  The patient has a previous history of brain aneurysm which was treated in 2012 however during the angiogram it was seen that she had a 50% right internal carotid artery stenosis.  There is been no evaluation since that time.  The patient does have sporadic blurry vision however this is been ongoing since she had intervention with her aneurysm.  The patient also has known fibromuscular dysplasia.  Fibromuscular dysplasia also creates a risk for renal stenosis.  The patient has had recent changes to her blood pressure medication however she notes that she still continues to have elevated blood pressures.  She denies any fever, chills.  Today noninvasive studies show 1 to 39% stenosis of the bilateral internal carotid arteries.  The patient has normal flow hemodynamics in the bilateral subclavian arteries with antegrade flow in the bilateral vertebral arteries.  The left renal artery has no evidence of stenosis.  The left kidney is of normal size.  The right kidney is abnormal sized at 8.26 cm.  There is a noted 1 to 59% stenosis of the right renal artery.  It is suggested that it is nearly high and of this range in the proximal renal artery.  The small kidney size compared to the left also suggest significant stenosis.   Review of Systems  Eyes: Positive for visual disturbance.  All other systems reviewed and are negative.      Objective:   Physical Exam Vitals reviewed.  HENT:     Head: Normocephalic.  Neck:     Vascular: No carotid bruit.  Cardiovascular:     Rate and Rhythm: Normal rate.     Pulses: Normal pulses.  Pulmonary:     Effort: Pulmonary effort  is normal.  Neurological:     Mental Status: She is alert and oriented to person, place, and time.  Psychiatric:        Mood and Affect: Mood normal.        Behavior: Behavior normal.        Thought Content: Thought content normal.        Judgment: Judgment normal.     BP (!) 173/77   Pulse 77   Ht 5\' 2"  (1.575 m)   Wt 121 lb (54.9 kg)   BMI 22.13 kg/m   Past Medical History:  Diagnosis Date  . Anxiety   . 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 Brain aneurysm   . Complication of anesthesia   . Depression   . GERD (gastroesophageal reflux disease)   . Headache(784.0)    migraines  . Hypertension   . Mitral valve prolapse   . PONV (postoperative nausea and vomiting)     Social History   Socioeconomic History  . Marital status: Divorced    Spouse name: Not on file  . Number of children: Not on file  . Years of education: Not on file  . Highest education level: Not on file  Occupational History  . Not on file  Tobacco Use  . Smoking status: Never Smoker  . Smokeless tobacco: Never Used  Vaping Use  . Vaping Use: Never used  Substance and Sexual Activity  .  Alcohol use: Yes    Alcohol/week: 1.0 standard drink    Types: 1 Glasses of wine per week    Comment: every evening  . Drug use: No  . Sexual activity: Not Currently  Other Topics Concern  . Not on file  Social History Narrative   Right handed.  Caffeine decaf 1 cup in am.  Divorced.  1 adopted daughter.  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    Social Determinants of Health   Financial Resource Strain: Not on file  Food Insecurity: Not on file  Transportation Needs: Not on file  Physical Activity: Not on file  Stress: Not on file  Social Connections: Not on file  Intimate Partner Violence: Not on file    Past Surgical History:  Procedure Laterality Date  . ABDOMINAL HYSTERECTOMY  1987  . ANEURYSM COILING    . CHOLECYSTECTOMY  2018  . CRANIOTOMY Left 11/15/2012   Procedure: CRANIOTOMY INTRACRANIAL ANEURYSM FOR CAROTID;   Surgeon: Winfield Cunas, MD;  Location: [place] [facility];  Service: Neurosurgery;  Laterality: Left;  LEFT Craniotomy for Aneurysm  . ECTOPIC PREGNANCY SURGERY    . LUMBAR LAMINECTOMY/DECOMPRESSION MICRODISCECTOMY N/A 04/27/2014   Procedure: LUMBAR LAMINECTOMY/DECOMPRESSION MICRODISCECTOMY LUMBAR FOUR-FIVE ;  Surgeon: Kristeen Miss, MD;  Location: [place] [facility];  Service: Neurosurgery;  Laterality: N/A;  . RADIOLOGY WITH ANESTHESIA N/A 10/09/2012   Procedure: RADIOLOGY WITH ANESTHESIA;  Surgeon: Rob Hickman, MD;  Location: [facility];  Service: Radiology;  Laterality: N/A;  . TONSILLECTOMY    . VASCULAR SURGERY     stent placed    Family History  Problem Relation Age of Onset  . Dementia Father   . Dementia Brother     Allergies  Allergen Reactions  . Latex Anaphylaxis  . Codeine Nausea Only  . Hydromorphone Itching    CBC Latest Ref Rng & Units 02/11/2020 06/15/2016 04/26/2014  WBC 3.8 - 10.8 Thousand/uL 7.3 6.5 6.1  Hemoglobin 11.7 - 15.5 g/dL 13.7 13.7 13.6  Hematocrit 35.0 - 45.0 % 40.6 40.9 39.7  Platelets 140 - 400 Thousand/uL 267 228 266      CMP     Component Value Date/Time   NA 141 02/11/2020 0852   NA 144 01/12/2020 1438   K 4.2 02/11/2020 0852   CL 103 02/11/2020 0852   CO2 29 02/11/2020 0852   GLUCOSE 103 (H) 02/11/2020 0852   BUN 18 02/11/2020 0852   BUN 15 01/12/2020 1438   CREATININE 0.72 02/11/2020 0852   CALCIUM 9.5 02/11/2020 0852   PROT 7.0 02/11/2020 0852   ALBUMIN 4.4 06/15/2016 1711   AST 22 02/11/2020 0852   ALT 25 02/11/2020 0852   ALKPHOS 71 06/15/2016 1711   BILITOT 0.5 02/11/2020 0852   GFRNONAA 78 01/12/2020 1438   GFRAA 90 01/12/2020 1438     No results found.     Assessment & Plan:   1. Stenosis of right carotid artery Recommend:  Given the patient's asymptomatic subcritical stenosis no further invasive testing or surgery at this time.  Duplex ultrasound shows 1-39% stenosis bilaterally.  Continue antiplatelet therapy as  prescribed Continue management of CAD, HTN and Hyperlipidemia Healthy heart diet,  encouraged exercise at least 4 times per week Follow up in 12 months with duplex ultrasound and physical exam   2. Renal artery stenosis (HCC) Recommend:  The patient has evidence of severe atherosclerotic changes of the renal artery with worsening of the atherosclerosis of the renal arteries associated with severe hypertension.  This represents a  high risk for CVA, MI and renal failure.  Patient should undergo angiography of the renal artery with the hope for intervention for control of hypertension.  The risks and benefits as well as the alternative therapies was discussed in detail with the patient.  All questions were answered.  Patient agrees to proceed with angiography.    3. Primary hypertension Continue antihypertensive medications as already ordered, these medications have been reviewed and there are no changes at this time.   4. Mixed hyperlipidemia Continue statin as ordered and reviewed, no changes at this time    Current Outpatient Medications on File Prior to Visit  Medication Sig Dispense Refill  . Acetylcarnitine HCl (ACETYL L-CARNITINE) 500 MG CAPS Take by mouth.    . ALOE PO Take by mouth.    Marland Kitchen amLODipine (NORVASC) 10 MG tablet Take 10 mg by mouth daily.    Marland Kitchen aspirin EC 81 MG tablet Take 1 tablet (81 mg total) by mouth daily. Swallow whole. 90 tablet 3  . Borage Oil-GLA-Linoleic Acid (BORAGE PO) Take by mouth.    . carvedilol (COREG) 25 MG tablet Take 1 tablet (25 mg total) by mouth 2 (two) times daily. 60 tablet 5  . esomeprazole (NEXIUM) 40 MG capsule Take 40 mg by mouth daily before breakfast.    . ibuprofen (ADVIL) 800 MG tablet Take 1 tablet (800 mg total) by mouth every 8 (eight) hours as needed for headache. 90 tablet 2  . Magnesium 200 MG TABS Take 200 mg by mouth daily.     . Multiple Vitamin (MULTI-VITAMIN DAILY PO) Take by mouth.    . Multiple Vitamin (MULTIVITAMIN WITH  MINERALS) TABS tablet Take 2 tablets by mouth daily. Gummy Vitamin    . naproxen (NAPROSYN) 500 MG tablet Take 1 tablet (500 mg total) by mouth 2 (two) times daily. 30 tablet 0  . NON FORMULARY Take 1 capsule by mouth daily. Tru Niagen    . NON FORMULARY Take 1 capsule by mouth daily. Cordyceps    . Omega 3 1000 MG CAPS Take by mouth.    . Probiotic Product (PROBIOTIC-10 PO) Take by mouth.    . rosuvastatin (CRESTOR) 10 MG tablet Take 1 tablet (10 mg total) by mouth at bedtime. 90 tablet 3  . SPIRULINA PO Take by mouth.    . traZODone (DESYREL) 50 MG tablet Take 1 tablet (50 mg total) by mouth at bedtime. 30 tablet 2  . venlafaxine XR (EFFEXOR-XR) 75 MG 24 hr capsule Take 1 capsule (75 mg total) by mouth daily with breakfast. 90 capsule 3  . vitamin C (ASCORBIC ACID) 500 MG tablet Take 500 mg by mouth daily.    . [DISCONTINUED] diphenhydrAMINE (BENADRYL) 25 MG tablet Take 25 mg by mouth daily as needed for allergies.    . [DISCONTINUED] ezetimibe-simvastatin (VYTORIN) 10-40 MG per tablet Take 1 tablet by mouth at bedtime.      No current facility-administered medications on file prior to visit.    There are no Patient Instructions on file for this visit. No follow-ups on file.   Kris Hartmann, NP

## 2020-02-29 NOTE — H&P (View-Only) (Signed)
Subjective:    Patient ID: Amber Brooks, female    DOB: 1945/09/18, 75 y.o.   MRN: [id number] Chief Complaint  Patient presents with  . Follow-up    Pt conv. Renal carotid     Amber Brooks is a 75 year old female that presents today for follow-up evaluation of carotid artery stenosis in addition to renal artery stenosis.  The patient has a previous history of brain aneurysm which was treated in 2012 however during the angiogram it was seen that she had a 50% right internal carotid artery stenosis.  There is been no evaluation since that time.  The patient does have sporadic blurry vision however this is been ongoing since she had intervention with her aneurysm.  The patient also has known fibromuscular dysplasia.  Fibromuscular dysplasia also creates a risk for renal stenosis.  The patient has had recent changes to her blood pressure medication however she notes that she still continues to have elevated blood pressures.  She denies any fever, chills.  Today noninvasive studies show 1 to 39% stenosis of the bilateral internal carotid arteries.  The patient has normal flow hemodynamics in the bilateral subclavian arteries with antegrade flow in the bilateral vertebral arteries.  The left renal artery has no evidence of stenosis.  The left kidney is of normal size.  The right kidney is abnormal sized at 8.26 cm.  There is a noted 1 to 59% stenosis of the right renal artery.  It is suggested that it is nearly high and of this range in the proximal renal artery.  The small kidney size compared to the left also suggest significant stenosis.   Review of Systems  Eyes: Positive for visual disturbance.  All other systems reviewed and are negative.      Objective:   Physical Exam Vitals reviewed.  HENT:     Head: Normocephalic.  Neck:     Vascular: No carotid bruit.  Cardiovascular:     Rate and Rhythm: Normal rate.     Pulses: Normal pulses.  Pulmonary:     Effort: Pulmonary effort  is normal.  Neurological:     Mental Status: She is alert and oriented to person, place, and time.  Psychiatric:        Mood and Affect: Mood normal.        Behavior: Behavior normal.        Thought Content: Thought content normal.        Judgment: Judgment normal.     BP (!) 173/77   Pulse 77   Ht 5\' 2"  (1.575 m)   Wt 121 lb (54.9 kg)   BMI 22.13 kg/m   Past Medical History:  Diagnosis Date  . Anxiety   . 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 Brain aneurysm   . Complication of anesthesia   . Depression   . GERD (gastroesophageal reflux disease)   . Headache(784.0)    migraines  . Hypertension   . Mitral valve prolapse   . PONV (postoperative nausea and vomiting)     Social History   Socioeconomic History  . Marital status: Divorced    Spouse name: Not on file  . Number of children: Not on file  . Years of education: Not on file  . Highest education level: Not on file  Occupational History  . Not on file  Tobacco Use  . Smoking status: Never Smoker  . Smokeless tobacco: Never Used  Vaping Use  . Vaping Use: Never used  Substance and Sexual Activity  .  Alcohol use: Yes    Alcohol/week: 1.0 standard drink    Types: 1 Glasses of wine per week    Comment: every evening  . Drug use: No  . Sexual activity: Not Currently  Other Topics Concern  . Not on file  Social History Narrative   Right handed.  Caffeine decaf 1 cup in am.  Divorced.  1 adopted daughter.  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    Social Determinants of Health   Financial Resource Strain: Not on file  Food Insecurity: Not on file  Transportation Needs: Not on file  Physical Activity: Not on file  Stress: Not on file  Social Connections: Not on file  Intimate Partner Violence: Not on file    Past Surgical History:  Procedure Laterality Date  . ABDOMINAL HYSTERECTOMY  1987  . ANEURYSM COILING    . CHOLECYSTECTOMY  2018  . CRANIOTOMY Left 11/15/2012   Procedure: CRANIOTOMY INTRACRANIAL ANEURYSM FOR CAROTID;   Surgeon: Winfield Cunas, MD;  Location: [facility];  Service: Neurosurgery;  Laterality: Left;  LEFT Craniotomy for Aneurysm  . ECTOPIC PREGNANCY SURGERY    . LUMBAR LAMINECTOMY/DECOMPRESSION MICRODISCECTOMY N/A 04/27/2014   Procedure: LUMBAR LAMINECTOMY/DECOMPRESSION MICRODISCECTOMY LUMBAR FOUR-FIVE ;  Surgeon: Kristeen Miss, MD;  Location: [facility];  Service: Neurosurgery;  Laterality: N/A;  . RADIOLOGY WITH ANESTHESIA N/A 10/09/2012   Procedure: RADIOLOGY WITH ANESTHESIA;  Surgeon: Rob Hickman, MD;  Location: [facility];  Service: Radiology;  Laterality: N/A;  . TONSILLECTOMY    . VASCULAR SURGERY     stent placed    Family History  Problem Relation Age of Onset  . Dementia Father   . Dementia Brother     Allergies  Allergen Reactions  . Latex Anaphylaxis  . Codeine Nausea Only  . Hydromorphone Itching    CBC Latest Ref Rng & Units 02/11/2020 06/15/2016 04/26/2014  WBC 3.8 - 10.8 Thousand/uL 7.3 6.5 6.1  Hemoglobin 11.7 - 15.5 g/dL 13.7 13.7 13.6  Hematocrit 35.0 - 45.0 % 40.6 40.9 39.7  Platelets 140 - 400 Thousand/uL 267 228 266      CMP     Component Value Date/Time   NA 141 02/11/2020 0852   NA 144 01/12/2020 1438   K 4.2 02/11/2020 0852   CL 103 02/11/2020 0852   CO2 29 02/11/2020 0852   GLUCOSE 103 (H) 02/11/2020 0852   BUN 18 02/11/2020 0852   BUN 15 01/12/2020 1438   CREATININE 0.72 02/11/2020 0852   CALCIUM 9.5 02/11/2020 0852   PROT 7.0 02/11/2020 0852   ALBUMIN 4.4 06/15/2016 1711   AST 22 02/11/2020 0852   ALT 25 02/11/2020 0852   ALKPHOS 71 06/15/2016 1711   BILITOT 0.5 02/11/2020 0852   GFRNONAA 78 01/12/2020 1438   GFRAA 90 01/12/2020 1438     No results found.     Assessment & Plan:   1. Stenosis of right carotid artery Recommend:  Given the patient's asymptomatic subcritical stenosis no further invasive testing or surgery at this time.  Duplex ultrasound shows 1-39% stenosis bilaterally.  Continue antiplatelet therapy as  prescribed Continue management of CAD, HTN and Hyperlipidemia Healthy heart diet,  encouraged exercise at least 4 times per week Follow up in 12 months with duplex ultrasound and physical exam   2. Renal artery stenosis (HCC) Recommend:  The patient has evidence of severe atherosclerotic changes of the renal artery with worsening of the atherosclerosis of the renal arteries associated with severe hypertension.  This represents a  high risk for CVA, MI and renal failure.  Patient should undergo angiography of the renal artery with the hope for intervention for control of hypertension.  The risks and benefits as well as the alternative therapies was discussed in detail with the patient.  All questions were answered.  Patient agrees to proceed with angiography.    3. Primary hypertension Continue antihypertensive medications as already ordered, these medications have been reviewed and there are no changes at this time.   4. Mixed hyperlipidemia Continue statin as ordered and reviewed, no changes at this time    Current Outpatient Medications on File Prior to Visit  Medication Sig Dispense Refill  . Acetylcarnitine HCl (ACETYL L-CARNITINE) 500 MG CAPS Take by mouth.    . ALOE PO Take by mouth.    Marland Kitchen amLODipine (NORVASC) 10 MG tablet Take 10 mg by mouth daily.    Marland Kitchen aspirin EC 81 MG tablet Take 1 tablet (81 mg total) by mouth daily. Swallow whole. 90 tablet 3  . Borage Oil-GLA-Linoleic Acid (BORAGE PO) Take by mouth.    . carvedilol (COREG) 25 MG tablet Take 1 tablet (25 mg total) by mouth 2 (two) times daily. 60 tablet 5  . esomeprazole (NEXIUM) 40 MG capsule Take 40 mg by mouth daily before breakfast.    . ibuprofen (ADVIL) 800 MG tablet Take 1 tablet (800 mg total) by mouth every 8 (eight) hours as needed for headache. 90 tablet 2  . Magnesium 200 MG TABS Take 200 mg by mouth daily.     . Multiple Vitamin (MULTI-VITAMIN DAILY PO) Take by mouth.    . Multiple Vitamin (MULTIVITAMIN WITH  MINERALS) TABS tablet Take 2 tablets by mouth daily. Gummy Vitamin    . naproxen (NAPROSYN) 500 MG tablet Take 1 tablet (500 mg total) by mouth 2 (two) times daily. 30 tablet 0  . NON FORMULARY Take 1 capsule by mouth daily. Tru Niagen    . NON FORMULARY Take 1 capsule by mouth daily. Cordyceps    . Omega 3 1000 MG CAPS Take by mouth.    . Probiotic Product (PROBIOTIC-10 PO) Take by mouth.    . rosuvastatin (CRESTOR) 10 MG tablet Take 1 tablet (10 mg total) by mouth at bedtime. 90 tablet 3  . SPIRULINA PO Take by mouth.    . traZODone (DESYREL) 50 MG tablet Take 1 tablet (50 mg total) by mouth at bedtime. 30 tablet 2  . venlafaxine XR (EFFEXOR-XR) 75 MG 24 hr capsule Take 1 capsule (75 mg total) by mouth daily with breakfast. 90 capsule 3  . vitamin C (ASCORBIC ACID) 500 MG tablet Take 500 mg by mouth daily.    . [DISCONTINUED] diphenhydrAMINE (BENADRYL) 25 MG tablet Take 25 mg by mouth daily as needed for allergies.    . [DISCONTINUED] ezetimibe-simvastatin (VYTORIN) 10-40 MG per tablet Take 1 tablet by mouth at bedtime.      No current facility-administered medications on file prior to visit.    There are no Patient Instructions on file for this visit. No follow-ups on file.   Kris Hartmann, NP

## 2020-03-01 ENCOUNTER — Ambulatory Visit: Payer: PPO | Admitting: Gastroenterology

## 2020-03-05 ENCOUNTER — Other Ambulatory Visit
Admission: RE | Admit: 2020-03-05 | Discharge: 2020-03-05 | Disposition: A | Discharge status: Home / Self Care | Payer: PPO | Source: Ambulatory Visit | Attending: Vascular Surgery | Admitting: Vascular Surgery

## 2020-03-05 ENCOUNTER — Other Ambulatory Visit: Payer: Self-pay

## 2020-03-05 DIAGNOSIS — Z20822 Contact with and (suspected) exposure to covid-19: Secondary | ICD-10-CM | POA: Insufficient documentation

## 2020-03-05 DIAGNOSIS — Z01812 Encounter for preprocedural laboratory examination: Secondary | ICD-10-CM | POA: Insufficient documentation

## 2020-03-05 LAB — SARS CORONAVIRUS 2 (TAT 6-24 HRS): SARS Coronavirus 2: NEGATIVE

## 2020-03-08 ENCOUNTER — Other Ambulatory Visit (INDEPENDENT_AMBULATORY_CARE_PROVIDER_SITE_OTHER): Payer: Self-pay | Admitting: Nurse Practitioner

## 2020-03-09 ENCOUNTER — Other Ambulatory Visit: Payer: Self-pay

## 2020-03-09 ENCOUNTER — Encounter: Payer: Self-pay | Admitting: Vascular Surgery

## 2020-03-09 ENCOUNTER — Encounter
Admission: RE | Disposition: A | Discharge status: Home / Self Care | Payer: Self-pay | Source: Home / Self Care | Attending: Vascular Surgery

## 2020-03-09 ENCOUNTER — Ambulatory Visit
Admission: RE | Admit: 2020-03-09 | Discharge: 2020-03-09 | Disposition: A | Discharge status: Home / Self Care | Payer: PPO | Attending: Vascular Surgery | Admitting: Vascular Surgery

## 2020-03-09 DIAGNOSIS — Z95828 Presence of other vascular implants and grafts: Secondary | ICD-10-CM | POA: Insufficient documentation

## 2020-03-09 DIAGNOSIS — E782 Mixed hyperlipidemia: Secondary | ICD-10-CM | POA: Insufficient documentation

## 2020-03-09 DIAGNOSIS — Z7982 Long term (current) use of aspirin: Secondary | ICD-10-CM | POA: Diagnosis not present

## 2020-03-09 DIAGNOSIS — I6521 Occlusion and stenosis of right carotid artery: Secondary | ICD-10-CM | POA: Diagnosis not present

## 2020-03-09 DIAGNOSIS — Z885 Allergy status to narcotic agent status: Secondary | ICD-10-CM | POA: Insufficient documentation

## 2020-03-09 DIAGNOSIS — Z90710 Acquired absence of both cervix and uterus: Secondary | ICD-10-CM | POA: Diagnosis not present

## 2020-03-09 DIAGNOSIS — Z9049 Acquired absence of other specified parts of digestive tract: Secondary | ICD-10-CM | POA: Diagnosis not present

## 2020-03-09 DIAGNOSIS — I671 Cerebral aneurysm, nonruptured: Secondary | ICD-10-CM | POA: Diagnosis not present

## 2020-03-09 DIAGNOSIS — Z91040 Latex allergy status: Secondary | ICD-10-CM | POA: Diagnosis not present

## 2020-03-09 DIAGNOSIS — Z79899 Other long term (current) drug therapy: Secondary | ICD-10-CM | POA: Insufficient documentation

## 2020-03-09 DIAGNOSIS — I150 Renovascular hypertension: Secondary | ICD-10-CM | POA: Insufficient documentation

## 2020-03-09 DIAGNOSIS — I701 Atherosclerosis of renal artery: Secondary | ICD-10-CM | POA: Diagnosis not present

## 2020-03-09 HISTORY — PX: RENAL ANGIOGRAPHY: CATH118260

## 2020-03-09 LAB — CREATININE, SERUM
Creatinine, Ser: 0.8 mg/dL (ref 0.44–1.00)
GFR, Estimated: >60 mL/min (ref >60)

## 2020-03-09 LAB — BUN: BUN: 18 mg/dL (ref 8–23)

## 2020-03-09 SURGERY — RENAL ANGIOGRAPHY
Anesthesia: Moderate Sedation

## 2020-03-09 MED ORDER — HEPARIN SODIUM (PORCINE) 1000 UNIT/ML IJ SOLN
INTRAMUSCULAR | Status: AC
  Filled 2020-03-09: qty 1

## 2020-03-09 MED ORDER — SODIUM CHLORIDE 0.9% FLUSH: 3.0000 mL | Freq: Two times a day (BID) | INTRAVENOUS | Status: DC

## 2020-03-09 MED ORDER — MIDAZOLAM HCL 5 MG/5ML IJ SOLN
INTRAMUSCULAR | Status: AC
  Filled 2020-03-09: qty 5

## 2020-03-09 MED ORDER — MIDAZOLAM HCL 2 MG/2ML IJ SOLN
INTRAMUSCULAR | Status: DC | PRN
  Administered 2020-03-09: 2 mg via INTRAVENOUS
  Administered 2020-03-09 (×2): 0.5 mg via INTRAVENOUS

## 2020-03-09 MED ORDER — CEFAZOLIN SODIUM-DEXTROSE 2-4 GM/100ML-% IV SOLN: 2.0000 g | Freq: Once | INTRAVENOUS | Status: DC

## 2020-03-09 MED ORDER — FENTANYL CITRATE (PF) 100 MCG/2ML IJ SOLN
INTRAMUSCULAR | Status: AC
  Filled 2020-03-09: qty 2

## 2020-03-09 MED ORDER — ACETAMINOPHEN 325 MG PO TABS: 650.0000 mg | ORAL_TABLET | ORAL | Status: DC | PRN

## 2020-03-09 MED ORDER — HYDRALAZINE HCL 20 MG/ML IJ SOLN: 5.0000 mg | INTRAMUSCULAR | Status: DC | PRN

## 2020-03-09 MED ORDER — FENTANYL CITRATE (PF) 100 MCG/2ML IJ SOLN
INTRAMUSCULAR | Status: DC | PRN
  Administered 2020-03-09: 50 ug via INTRAVENOUS
  Administered 2020-03-09: 25 ug via INTRAVENOUS

## 2020-03-09 MED ORDER — SODIUM CHLORIDE 0.9% FLUSH: 3.0000 mL | INTRAVENOUS | Status: DC | PRN

## 2020-03-09 MED ORDER — ONDANSETRON HCL 4 MG/2ML IJ SOLN: 4.0000 mg | Freq: Four times a day (QID) | INTRAMUSCULAR | Status: DC | PRN

## 2020-03-09 MED ORDER — METHYLPREDNISOLONE SODIUM SUCC 125 MG IJ SOLR: 125.0000 mg | Freq: Once | INTRAMUSCULAR | Status: DC | PRN

## 2020-03-09 MED ORDER — SODIUM CHLORIDE 0.9 % IV SOLN: 250.0000 mL | INTRAVENOUS | Status: DC | PRN

## 2020-03-09 MED ORDER — OXYCODONE HCL 5 MG PO TABS: 5.0000 mg | ORAL_TABLET | ORAL | Status: DC | PRN

## 2020-03-09 MED ORDER — IODIXANOL 320 MG/ML IV SOLN
INTRAVENOUS | Status: DC | PRN
  Administered 2020-03-09: 20 mL via INTRA_ARTERIAL

## 2020-03-09 MED ORDER — SODIUM CHLORIDE 0.9 % IV SOLN: INTRAVENOUS | Status: DC

## 2020-03-09 MED ORDER — MORPHINE SULFATE (PF) 4 MG/ML IV SOLN: 2.0000 mg | INTRAVENOUS | Status: DC | PRN

## 2020-03-09 MED ORDER — MIDAZOLAM HCL 2 MG/ML PO SYRP: 8.0000 mg | ORAL_SOLUTION | Freq: Once | ORAL | Status: DC | PRN

## 2020-03-09 MED ORDER — DIPHENHYDRAMINE HCL 50 MG/ML IJ SOLN: 50.0000 mg | Freq: Once | INTRAMUSCULAR | Status: DC | PRN

## 2020-03-09 MED ORDER — FAMOTIDINE 20 MG PO TABS: 40.0000 mg | ORAL_TABLET | Freq: Once | ORAL | Status: DC | PRN

## 2020-03-09 MED ORDER — LABETALOL HCL 5 MG/ML IV SOLN: 10.0000 mg | INTRAVENOUS | Status: DC | PRN

## 2020-03-09 MED ORDER — CEFAZOLIN SODIUM-DEXTROSE 2-4 GM/100ML-% IV SOLN
INTRAVENOUS | Status: AC
  Filled 2020-03-09: qty 100

## 2020-03-09 MED ORDER — FENTANYL CITRATE (PF) 100 MCG/2ML IJ SOLN: 12.5000 ug | Freq: Once | INTRAMUSCULAR | Status: DC | PRN

## 2020-03-09 SURGICAL SUPPLY — 11 items
CANNULA 5F STIFF (CANNULA) ×1 IMPLANT
CATH ANGIO 5F PIGTAIL 65CM (CATHETERS) ×1 IMPLANT
CATH VS15FR (CATHETERS) ×1 IMPLANT
DEVICE STARCLOSE SE CLOSURE (Vascular Products) ×1 IMPLANT
DEVICE TORQUE (MISCELLANEOUS) ×1 IMPLANT
GLIDEWIRE STIFF .35X180X3 HYDR (WIRE) ×1 IMPLANT
PACK ANGIOGRAPHY (CUSTOM PROCEDURE TRAY) ×2 IMPLANT
SHEATH BRITE TIP 5FRX11 (SHEATH) ×1 IMPLANT
SYR MEDRAD MARK 7 150ML (SYRINGE) ×1 IMPLANT
TUBING CONTRAST HIGH PRESS 72 (TUBING) ×1 IMPLANT
WIRE GUIDERIGHT .035X150 (WIRE) ×1 IMPLANT

## 2020-03-09 NOTE — Interval H&P Note (Signed)
History and Physical Interval Note:  03/09/2020 2:15 PM  Amber Brooks  has presented today for surgery, with the diagnosis of Renal Angio   Renal artery stenosis Covid  March 4.  The various methods of treatment have been discussed with the patient and family. After consideration of risks, benefits and other options for treatment, the patient has consented to  Procedure(s): RENAL ANGIOGRAPHY (N/A) as a surgical intervention.  The patient's history has been reviewed, patient examined, no change in status, stable for surgery.  I have reviewed the patient's chart and labs.  Questions were answered to the patient's satisfaction.     Hortencia Pilar

## 2020-03-09 NOTE — Op Note (Signed)
[facility]  Percutaneous Study/Intervention Procedural Note   Date of Surgery: 03/09/2020,4:09 PM  Surgeon:Schnier, Dolores Lory   Pre-operative Diagnosis: Difficult to control hypertension; right renal artery stenosis  Post-operative diagnosis: Difficult to control hypertension; mild atherosclerotic changes of the right renal artery  Procedure(s) Performed:  1.  Abdominal aortogram  2.  Selective injection of the right renal artery  3.  Star close right common femoral    Anesthesia: Conscious sedation was administered by the interventional radiology RN under my direct supervision. IV Versed plus fentanyl were utilized. Continuous ECG, pulse oximetry and blood pressure was monitored throughout the entire procedure.  Conscious sedation was administered for a total of 25 minutes and 36 seconds.  Sheath: 11 cm Pinnacle  Contrast: 20 cc   Fluoroscopy Time: 2.1 minutes  Indications:  The patient presents to [facility] with difficult to control hypertension.  Noninvasive studies suggest worsening of her right renal artery atherosclerotic changes.  Risks and benefits for angiography with the possibility of intervention and been reviewed all questions have been answered patient agrees to proceed.   Procedure:  Amber Devin Reynoldsis a 75 y.o. female who was identified and appropriate procedural time out was performed.  The patient was then placed supine on the table and prepped and draped in the usual sterile fashion.  Ultrasound was used to evaluate the right common femoral artery.  It was echolucent and pulsatile indicating it is patent .  An ultrasound image was acquired for the permanent record.  A micropuncture needle was used to access the right common femoral artery under direct ultrasound guidance.  The microwire was then advanced under fluoroscopic guidance without difficulty followed by the micro-sheath.  A 0.035 J wire was advanced without resistance and a 5Fr  sheath was placed.    Pigtail catheter was then advanced to the level of T12 and AP projection of the aorta was obtained.  The pigtail catheter was then exchanged for a VS 1 catheter which is reformed in the aorta and then used to select the right renal artery.  Magnified images of the right renal artery I then obtained by hand injection.  After review these images the catheter was removed over wire.  After review of the images the catheter was removed over wire and an RAO view of the groin was obtained. StarClose device was deployed without difficulty.   Findings:   Aortogram: The abdominal aorta is opacified with a bolus injection of contrast.  There is some very mild atherosclerotic changes noted in the infrarenal segment.  There are no other abnormalities identified.  Right Renal Artery: The right renal artery is opacified with bolus injection contrast.  There is some mild atherosclerotic changes to begin just distal to the origin and extend for approximately 15 mm.  This represents approximately 30% reduction in the diameter of the renal artery.  Distal to this lesion the artery appears completely normal and there is a normal nephrogram with a normal size kidney.  Left Renal Artery: The left renal artery is opacified with contrast from the aortic injection.  It is widely patent with a normal nephrogram noted.   Disposition: Patient was taken to the recovery room in stable condition having tolerated the procedure well.  Belenda Cruise Schnier 03/09/2020,4:09 PM

## 2020-03-09 NOTE — Discharge Instructions (Signed)

## 2020-03-10 ENCOUNTER — Telehealth (INDEPENDENT_AMBULATORY_CARE_PROVIDER_SITE_OTHER): Payer: Self-pay | Admitting: Vascular Surgery

## 2020-03-10 ENCOUNTER — Encounter: Payer: Self-pay | Admitting: Vascular Surgery

## 2020-03-10 NOTE — Telephone Encounter (Signed)
Called patient to set up 2 week follow up from procedure. Left VM.  AP

## 2020-03-14 ENCOUNTER — Other Ambulatory Visit: Payer: Self-pay | Admitting: Family Medicine

## 2020-03-14 DIAGNOSIS — F3342 Major depressive disorder, recurrent, in full remission: Secondary | ICD-10-CM

## 2020-03-14 NOTE — Telephone Encounter (Signed)
Requested Prescriptions  Pending Prescriptions Disp Refills   traZODone (DESYREL) 50 MG tablet [Pharmacy Med Name: traZODone HCl 50 MG Oral Tablet] 90 tablet 1    Sig: TAKE 1 TABLET BY MOUTH AT BEDTIME     Psychiatry: Antidepressants - Serotonin Modulator Passed - 03/14/2020  4:41 PM      Passed - Completed PHQ-2 or PHQ-9 in the last 360 days      Passed - Valid encounter within last 6 months    Recent Outpatient Visits          3 weeks ago Annual physical exam   [place], DO   2 months ago Acute cystitis with hematuria   [place], DO      Future Appointments            In 5 months Agbor-Etang, Aaron Edelman, MD [facility], [place]   In 11 months [place], Devonne Doughty, [facility], [facility]

## 2020-03-15 ENCOUNTER — Telehealth: Payer: Self-pay | Admitting: Family Medicine

## 2020-03-15 NOTE — Telephone Encounter (Signed)
Looks like patient dropped off a copy of her last Upper Endoscopy study done by [facility] June 2020, however when we last spoke in February 2022 this  Year for her annual physical, we discussed that we would need a copy of her last Colonoscopy. It looks like it should have been done by [facility] in 2019.  Would you be able to notify patient that we are still missing [facility] Colonoscopy from 2019?  If she cannot locate it, we would need a new records request sent to [place] 03/15/2020, 5:33 PM

## 2020-03-17 NOTE — Telephone Encounter (Signed)
I contacted [facility] office and left a message on the voicemail requesting her most recent colonoscopy to be faxed over to [place].

## 2020-03-22 ENCOUNTER — Encounter (INDEPENDENT_AMBULATORY_CARE_PROVIDER_SITE_OTHER): Payer: Self-pay | Admitting: Vascular Surgery

## 2020-03-22 ENCOUNTER — Ambulatory Visit (INDEPENDENT_AMBULATORY_CARE_PROVIDER_SITE_OTHER): Payer: PPO | Admitting: Vascular Surgery

## 2020-03-22 ENCOUNTER — Other Ambulatory Visit: Payer: Self-pay

## 2020-03-22 VITALS — BP 158/71 | HR 75 | Ht 62.0 in | Wt 119.0 lb

## 2020-03-22 DIAGNOSIS — E782 Mixed hyperlipidemia: Secondary | ICD-10-CM

## 2020-03-22 DIAGNOSIS — K219 Gastro-esophageal reflux disease without esophagitis: Secondary | ICD-10-CM | POA: Diagnosis not present

## 2020-03-22 DIAGNOSIS — M47817 Spondylosis without myelopathy or radiculopathy, lumbosacral region: Secondary | ICD-10-CM | POA: Diagnosis not present

## 2020-03-22 DIAGNOSIS — I10 Essential (primary) hypertension: Secondary | ICD-10-CM | POA: Diagnosis not present

## 2020-03-22 NOTE — Progress Notes (Signed)
MRN : [id number]  Amber Brooks is a 75 y.o. (Jun 20, 1945) female who presents with chief complaint of  Chief Complaint  Patient presents with  . Follow-up    2 week [facility] post renal angio   .  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 There have been no interval changes in the patient's blood pressure control.  She denies any major changes in her medications.  The patient denies headache or flushing.  No flank or unusual back pain.    There have been no significant changes to the patient's overall health care.  She has a known history of DJDat the L4-L5 level and is asking for a neurosurgeon.  No interval shortening of the patient's walking distance or new symptoms consistent with claudication.  The patient denies the  development of rest pain symptoms. No new ulcers or wounds have occurred since the last visit.  The patient denies amaurosis fugax or recent TIA symptoms. There are no recent neurological changes noted. The patient denies history of DVT, PE or superficial thrombophlebitis. The patient denies recent episodes of angina or shortness of breath.   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   Current Meds  Medication Sig  . amLODipine (NORVASC) 10 MG tablet Take 10 mg by mouth daily.  Marland Kitchen aspirin EC 81 MG tablet Take 1 tablet (81 mg total) by mouth daily. Swallow whole.  . carvedilol (COREG) 25 MG tablet Take 1 tablet (25 mg total) by mouth 2 (two) times daily.  . cholecalciferol (VITAMIN D3) 25 MCG (1000 UNIT) tablet Take 1,000 Units by mouth daily.  Marland Kitchen esomeprazole (NEXIUM) 40 MG capsule Take 40 mg by mouth daily before breakfast.  . ibuprofen (ADVIL) 800 MG tablet Take 1 tablet (800 mg total) by mouth every 8 (eight) hours as needed for headache.  . Multiple Vitamin  (MULTIVITAMIN WITH MINERALS) TABS tablet Take 1 tablet by mouth daily.  . NON FORMULARY Take 1 capsule by mouth daily. Tru Niagen  . NON FORMULARY Take 1 capsule by mouth daily. Cordyceps  . Omega 3 1000 MG CAPS Take 1,000 mg by mouth daily.  Marland Kitchen OVER THE COUNTER MEDICATION Take 1 tablet by mouth daily. Appetite support  . Probiotic Product (PROBIOTIC-10 PO) Take 1 capsule by mouth daily.  . rosuvastatin (CRESTOR) 10 MG tablet Take 1 tablet (10 mg total) by mouth at bedtime.  . traZODone (DESYREL) 50 MG tablet TAKE 1 TABLET BY MOUTH AT BEDTIME  . venlafaxine XR (EFFEXOR-XR) 150 MG 24 hr capsule Take 150 mg by mouth daily with breakfast.  . vitamin B-12 (CYANOCOBALAMIN) 1000 MCG tablet Take 1,000 mcg by mouth daily.  . vitamin C (ASCORBIC ACID) 500 MG tablet Take 500 mg by mouth daily.    Past Medical History:  Diagnosis Date  . Anxiety   . 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 Brain aneurysm   . Complication of anesthesia   . Depression   . GERD (gastroesophageal reflux disease)   . Headache(784.0)    migraines  . Hypertension   . Mitral valve prolapse   . PONV (postoperative nausea and vomiting)     Past Surgical History:  Procedure Laterality Date  . ABDOMINAL HYSTERECTOMY  1987  . ANEURYSM COILING    . CHOLECYSTECTOMY  2018  . CRANIOTOMY Left 11/15/2012  Procedure: CRANIOTOMY INTRACRANIAL ANEURYSM FOR CAROTID;  Surgeon: Winfield Cunas, MD;  Location: [place] [facility];  Service: Neurosurgery;  Laterality: Left;  LEFT Craniotomy for Aneurysm  . ECTOPIC PREGNANCY SURGERY    . LUMBAR LAMINECTOMY/DECOMPRESSION MICRODISCECTOMY N/A 04/27/2014   Procedure: LUMBAR LAMINECTOMY/DECOMPRESSION MICRODISCECTOMY LUMBAR FOUR-FIVE ;  Surgeon: Kristeen Miss, MD;  Location: [place] [facility];  Service: Neurosurgery;  Laterality: N/A;  . RADIOLOGY WITH ANESTHESIA N/A 10/09/2012   Procedure: RADIOLOGY WITH ANESTHESIA;  Surgeon: Rob Hickman, MD;  Location: [place];  Service: Radiology;  Laterality: N/A;  . RENAL  ANGIOGRAPHY N/A 03/09/2020   Procedure: RENAL ANGIOGRAPHY;  Surgeon: Katha Cabal, MD;  Location: [place] [facility];  Service: Cardiovascular;  Laterality: N/A;  . TONSILLECTOMY    . VASCULAR SURGERY     stent placed    Social History Social History   Tobacco Use  . Smoking status: Never Smoker  . Smokeless tobacco: Never Used  Vaping Use  . Vaping Use: Never used  Substance Use Topics  . Alcohol use: Yes    Alcohol/week: 1.0 standard drink    Types: 1 Glasses of wine per week    Comment: every evening  . Drug use: No    Family History Family History  Problem Relation Age of Onset  . Dementia Father   . Dementia Brother     Allergies  Allergen Reactions  . Latex Anaphylaxis  . Codeine Nausea Only  . Hydromorphone Itching     REVIEW OF SYSTEMS (Negative unless checked)  Constitutional: [] Weight loss  [] Fever  [] Chills Cardiac: [] Chest pain   [] Chest pressure   [] Palpitations   [] Shortness of breath when laying flat   [] Shortness of breath with exertion. Vascular:  [] Pain in legs with walking   [] Pain in legs at rest  [] History of DVT   [] Phlebitis   [] Swelling in legs   [] Varicose veins   [] Non-healing ulcers Pulmonary:   [] Uses home oxygen   [] Productive cough   [] Hemoptysis   [] Wheeze  [] COPD   [] Asthma Neurologic:  [] Dizziness   [] Seizures   [] History of stroke   [] History of TIA  [] Aphasia   [] Vissual changes   [] Weakness or numbness in arm   [] Weakness or numbness in leg Musculoskeletal:   [] Joint swelling   [] Joint pain   [x] Low back pain Hematologic:  [] Easy bruising  [] Easy bleeding   [] Hypercoagulable state   [] Anemic Gastrointestinal:  [] Diarrhea   [] Vomiting  [x] Gastroesophageal reflux/heartburn   [] Difficulty swallowing. Genitourinary:  [] Chronic kidney disease   [] Difficult urination  [] Frequent urination   [] Blood in urine Skin:  [] Rashes   [] Ulcers  Psychological:  [] History of anxiety   []  History of major depression.  Physical  Examination  Vitals:   03/22/20 1630  BP: (!) 158/71  Pulse: 75  Weight: 119 lb (54 kg)  Height: 5\' 2"  (1.575 m)   Body mass index is 21.77 kg/m. Gen: WD/WN, NAD Head: [place]/AT, No temporalis wasting.  Ear/Nose/Throat: Hearing grossly intact, nares w/o erythema or drainage Eyes: PER, EOMI, sclera nonicteric.  Neck: Supple, no large masses.   Pulmonary:  Good air movement, no audible wheezing bilaterally, no use of accessory muscles.  Cardiac: RRR, no JVD Vascular:  Vessel Right Left  Radial Palpable Palpable  Gastrointestinal: Non-distended. No guarding/no peritoneal signs.  Musculoskeletal: M/S 5/5 throughout.  No deformity or atrophy.  Neurologic: CN 2-12 intact. Symmetrical.  Speech is fluent. Motor exam as listed above. Psychiatric: Judgment intact, Mood & affect appropriate for  pt's clinical situation. Dermatologic: No rashes or ulcers noted.  No changes consistent with cellulitis.   CBC Lab Results  Component Value Date   WBC 7.3 02/11/2020   HGB 13.7 02/11/2020   HCT 40.6 02/11/2020   MCV 93.8 02/11/2020   PLT 267 02/11/2020    BMET    Component Value Date/Time   NA 141 02/11/2020 0852   NA 144 01/12/2020 1438   K 4.2 02/11/2020 0852   CL 103 02/11/2020 0852   CO2 29 02/11/2020 0852   GLUCOSE 103 (H) 02/11/2020 0852   BUN 18 03/09/2020 1351   BUN 15 01/12/2020 1438   CREATININE 0.80 03/09/2020 1351   CREATININE 0.72 02/11/2020 0852   CALCIUM 9.5 02/11/2020 0852   GFRNONAA >60 03/09/2020 1351   GFRAA 90 01/12/2020 1438   Estimated Creatinine Clearance: 49.5 mL/min (by C-G formula based on SCr of 0.8 mg/dL).  COAG Lab Results  Component Value Date   INR 1.08 04/06/2014   INR 0.96 10/04/2012   INR 0.99 03/17/2010    Radiology PERIPHERAL VASCULAR CATHETERIZATION  Result Date: 03/09/2020 See Op Note  VAS US CAROTID  Result Date: 03/01/2020 Carotid Arterial Duplex Study Indications: Carotid artery disease. Performing Technologist: Concha Norway RVT   Examination Guidelines: A complete evaluation includes B-mode imaging, spectral Doppler, color Doppler, and power Doppler as needed of all accessible portions of each vessel. Bilateral testing is considered an integral part of a complete examination. Limited examinations for reoccurring indications may be performed as noted.  Right Carotid Findings: +----------+--------+--------+--------+------------------+--------+           PSV cm/sEDV cm/sStenosisPlaque DescriptionComments +----------+--------+--------+--------+------------------+--------+ CCA Prox  49      9                                          +----------+--------+--------+--------+------------------+--------+ CCA Mid   56      11                                         +----------+--------+--------+--------+------------------+--------+ CCA Distal53      11                                         +----------+--------+--------+--------+------------------+--------+ ICA Prox  82      19      1-39%   heterogenous               +----------+--------+--------+--------+------------------+--------+ ICA Mid   62      14                                         +----------+--------+--------+--------+------------------+--------+ ICA Distal66      21                                         +----------+--------+--------+--------+------------------+--------+ ECA       105     10                                         +----------+--------+--------+--------+------------------+--------+ +----------+--------+-------+----------------+-------------------+  PSV cm/sEDV cmsDescribe        Arm Pressure (mmHG) +----------+--------+-------+----------------+-------------------+ Subclavian115            Multiphasic, WNL                    +----------+--------+-------+----------------+-------------------+ +---------+--------+--+--------+---------+ VertebralPSV cm/s71EDV cm/sAntegrade  +---------+--------+--+--------+---------+  Left Carotid Findings: +----------+--------+--------+--------+------------------+--------+           PSV cm/sEDV cm/sStenosisPlaque DescriptionComments +----------+--------+--------+--------+------------------+--------+ CCA Prox  77      15                                         +----------+--------+--------+--------+------------------+--------+ CCA Mid   69      18                                         +----------+--------+--------+--------+------------------+--------+ CCA Distal76      24                                         +----------+--------+--------+--------+------------------+--------+ ICA Prox  79      22      1-39%   heterogenous               +----------+--------+--------+--------+------------------+--------+ ICA Mid   66      16                                         +----------+--------+--------+--------+------------------+--------+ ICA Distal59      17                                         +----------+--------+--------+--------+------------------+--------+ ECA       56      5                                          +----------+--------+--------+--------+------------------+--------+ +----------+--------+--------+----------------+-------------------+           PSV cm/sEDV cm/sDescribe        Arm Pressure (mmHG) +----------+--------+--------+----------------+-------------------+ Subclavian101             Multiphasic, WNL                    +----------+--------+--------+----------------+-------------------+ +---------+--------+--+--------+---------+ VertebralPSV cm/s60EDV cm/sAntegrade +---------+--------+--+--------+---------+   Summary: Right Carotid: Velocities in the right ICA are consistent with a 1-39% stenosis.                Mid heterogeneous plaque in the bulb/ICA. Left Carotid: Velocities in the left ICA are consistent with a 1-39% stenosis.               Minimal plaque  in the bulb. Vertebrals:  Bilateral vertebral arteries demonstrate antegrade flow. Subclavians: Normal flow hemodynamics were seen in bilateral subclavian              arteries. *See table(s) above for measurements and observations.  Electronically signed by Hortencia Pilar MD on 03/01/2020 at  5:20:53 PM.    Final    VAS US RENAL ARTERY DUPLEX  Result Date: 03/01/2020 ABDOMINAL VISCERAL Indications: Uncontrolled HTN Performing Technologist: Concha Norway RVT  Examination Guidelines: A complete evaluation includes B-mode imaging, spectral Doppler, color Doppler, and power Doppler as needed of all accessible portions of each vessel. Bilateral testing is considered an integral part of a complete examination. Limited examinations for reoccurring indications may be performed as noted.  Duplex Findings: +----------+--------+--------+------+--------+ MesentericPSV cm/sEDV cm/sPlaqueComments +----------+--------+--------+------+--------+ Aorta Mid    91                          +----------+--------+--------+------+--------+    +------------------+--------+--------+-------+ Right Renal ArteryPSV cm/sEDV cm/sComment +------------------+--------+--------+-------+ Proximal            234                   +------------------+--------+--------+-------+ Mid                  81                   +------------------+--------+--------+-------+ Distal               84                   +------------------+--------+--------+-------+ +-----------------+--------+--------+-------+ Left Renal ArteryPSV cm/sEDV cm/sComment +-----------------+--------+--------+-------+ Proximal           121                   +-----------------+--------+--------+-------+ Mid                 55                   +-----------------+--------+--------+-------+ Distal              74                   +-----------------+--------+--------+-------+  +------------+--------+--------+----+-----------+--------+--------+----+ Right KidneyPSV cm/sEDV cm/sRI  Left KidneyPSV cm/sEDV cm/sRI   +------------+--------+--------+----+-----------+--------+--------+----+ Upper Pole                      Upper Pole                      +------------+--------+--------+----+-----------+--------+--------+----+ Mid         47      13      0.72Mid        44      13      0.70 +------------+--------+--------+----+-----------+--------+--------+----+ Lower Pole                      Lower Pole                      +------------+--------+--------+----+-----------+--------+--------+----+ Hilar                           Hilar                           +------------+--------+--------+----+-----------+--------+--------+----+ +------------------+----+------------------+----+ Right Kidney          Left Kidney            +------------------+----+------------------+----+ RAR                   RAR                    +------------------+----+------------------+----+  RAR (manual)      2.57RAR (manual)      1.33 +------------------+----+------------------+----+ Cortex                Cortex                 +------------------+----+------------------+----+ Cortex thickness      Corex thickness        +------------------+----+------------------+----+ Kidney length (cm)8.26Kidney length (cm)9.85 +------------------+----+------------------+----+  Summary: Renal:  Right: Abnormal size for the right kidney. Normal right Resisitive        Index. Normal cortical thickness of right kidney. RRV flow        present. 1-59% stenosis of the right renal artery. High end        of 1-59% stenosis of the proximal RA with a small kidney size        compared to the left suggesting significant stenosis. Left:  Normal size of left kidney. Normal left Resistive Index.        Normal cortical thickness of the left kidney. No evidence of        left  renal artery stenosis. LRV flow present.  *See table(s) above for measurements and observations.  Diagnosing physician: Hortencia Pilar MD  Electronically signed by Hortencia Pilar MD on 03/01/2020 at 5:20:56 PM.    Final      Assessment/Plan 1. Primary hypertension Given patient's arterial disease optimal control of the patient's hypertension is important. BP is acceptable today  The patient's vital signs and noninvasive studies support the renal artery stenosis is not significantly increased when compared to the previous study.  No invasive studies or intervention is indicated at this time.  The patient will continue the current antihypertensive medications, no changes at this time.  The primary medical service will continue aggressive antihypertensive therapy as per the AHA guidelines  - VAS US RENAL ARTERY DUPLEX; Future  2. Lumbar and sacral osteoarthritis I will put in a referral for Dr Izora Ribas  - Ambulatory referral to Neurosurgery   3. Mixed hyperlipidemia Continue statin as ordered and reviewed, no changes at this time   4. Gastroesophageal reflux disease without esophagitis Continue PPI as already ordered, this medication has been reviewed and there are no changes at this time.  Avoidence of caffeine and alcohol  Moderate elevation of the head of the bed      Hortencia Pilar, MD  03/22/2020 4:50 PM

## 2020-04-09 ENCOUNTER — Ambulatory Visit (INDEPENDENT_AMBULATORY_CARE_PROVIDER_SITE_OTHER): Payer: PPO | Admitting: Family Medicine

## 2020-04-09 ENCOUNTER — Other Ambulatory Visit: Payer: Self-pay

## 2020-04-09 ENCOUNTER — Encounter: Payer: Self-pay | Admitting: Family Medicine

## 2020-04-09 VITALS — BP 158/56 | HR 66 | Ht 62.0 in | Wt 120.6 lb

## 2020-04-09 DIAGNOSIS — R110 Nausea: Secondary | ICD-10-CM | POA: Diagnosis not present

## 2020-04-09 DIAGNOSIS — M542 Cervicalgia: Secondary | ICD-10-CM | POA: Diagnosis not present

## 2020-04-09 DIAGNOSIS — M62838 Other muscle spasm: Secondary | ICD-10-CM

## 2020-04-09 MED ORDER — ONDANSETRON 4 MG PO TBDP: 4.0000 mg | ORAL_TABLET | Freq: Three times a day (TID) | ORAL | 0 refills | Status: DC | PRN

## 2020-04-09 MED ORDER — CYCLOBENZAPRINE HCL 10 MG PO TABS
5.0000 mg | ORAL_TABLET | Freq: Three times a day (TID) | ORAL | 2 refills | Status: DC | PRN
Start: 2020-04-09 — End: 2020-04-12

## 2020-04-09 NOTE — Patient Instructions (Addendum)
Thank you for coming to the office today.  Use moist heat or heating pad on shoulders  Start Cyclobenzapine (Flexeril) 10mg  tablets (muscle relaxant) - start with half (cut) to one whole pill at night for muscle relaxant - may make you sedated or sleepy (be careful driving or working on this) if tolerated you can take half to whole tab 2 to 3 times daily or every 8 hours as needed  Topical NSAID Voltaren (Diclofenac) can be used up to 4 times a day if needed.  Increase Tylenol to 1000mg  up to 3 times daily for breakthrough pain for 3-5 days then only as needed  If you want to also take an anti inflammatory can keep on OTC Aleve approximately 500mg  twice daily (with food) for 5 to 7 days or may do Ibuprofen / Advil (600mg  every 6 to 8 hours) with food for 5 to 7 days  If develop severe headaches, nausea / vomiting, loss of vision, numbness, weakness or tingling - call 911 or go immediately to ED.   Please schedule a Follow-up Appointment to: Return in about 4 weeks (around 05/07/2020), or if symptoms worsen or fail to improve, for neck spasm.  If you have any other questions or concerns, please feel free to call the office or send a message through [place]. You may also schedule an earlier appointment if necessary.  Additionally, you may be receiving a survey about your experience at our office within a few days to 1 week by e-mail or mail. We value your feedback.  Nobie Putnam, DO [place]

## 2020-04-09 NOTE — Addendum Note (Signed)
Addended by: Olin Hauser on: 04/09/2020 11:21 PM   Modules accepted: Level of Service

## 2020-04-09 NOTE — Progress Notes (Signed)
Subjective:    Patient ID: Amber Brooks, female    DOB: 1945/04/28, 75 y.o.   MRN: [id number]  Amber Brooks is a 75 y.o. female presenting on 04/09/2020 for Neck Pain and Shoulder Pain  Patient presents for a same day appointment.  HPI   Left Neck Pain, acute Trapezius muscle spasm She reports yesterday AM she was using a blow dryer on hair, and she turned quickly and had a sudden severe pain at the time and has been fairly constant since that time. Caused some nausea without vomiting. Worse if side bending neck to the Left, otherwise Rotation is okay but slightly limited, and forward flexion does bother and cause pain same with extension. Her Left shoulder is not bothering her as much can lift and have good range of motion on her shoulder.  She feels a stretching and tightening pain with moving.  She took Aleve OTC 1 dose yesterday AM didn't help. Ibuprofen 800mg  1am today without much relief.  Denies any numbness tingling weakness, headache, injury   Depression screen [facility] 2/9 12/18/2019  Decreased Interest 0  Down, Depressed, Hopeless 0  PHQ - 2 Score 0  Altered sleeping 1  Tired, decreased energy 0  Change in appetite 0  Feeling bad or failure about yourself  0  Trouble concentrating 0  Moving slowly or fidgety/restless 0  Suicidal thoughts 0  PHQ-9 Score 1  Difficult doing work/chores Not difficult at all    Social History   Tobacco Use  . Smoking status: Never Smoker  . Smokeless tobacco: Never Used  Vaping Use  . Vaping Use: Never used  Substance Use Topics  . Alcohol use: Yes    Alcohol/week: 1.0 standard drink    Types: 1 Glasses of wine per week    Comment: every evening  . Drug use: No    Review of Systems Per HPI unless specifically indicated above     Objective:    BP (!) 158/56   Pulse 66   Ht 5\' 2"  (1.575 m)   Wt 120 lb 9.6 oz (54.7 kg)   SpO2 100%   BMI 22.06 kg/m   Wt Readings from Last 3 Encounters:  04/09/20 120 lb 9.6 oz  (54.7 kg)  03/22/20 119 lb (54 kg)  03/09/20 119 lb (54 kg)    Physical Exam Vitals and nursing note reviewed.  Constitutional:      General: She is not in acute distress.    Appearance: She is well-developed. She is not diaphoretic.     Comments: Well-appearing, comfortable, cooperative  HENT:     Head: Normocephalic and atraumatic.  Eyes:     General:        Right eye: No discharge.        Left eye: No discharge.     Conjunctiva/sclera: Conjunctivae normal.  Cardiovascular:     Rate and Rhythm: Normal rate.  Pulmonary:     Effort: Pulmonary effort is normal.  Musculoskeletal:        General: Tenderness present.     Comments: Left trapezius muscle spasm hypertonicity, non tender C-spine, L shoulder full ROM non tender.  Skin:    General: Skin is warm and dry.     Findings: No erythema or rash.  Neurological:     Mental Status: She is alert and oriented to person, place, and time.  Psychiatric:        Behavior: Behavior normal.     Comments: Well groomed, good eye contact,  normal speech and thoughts    Results for orders placed or performed during the hospital encounter of 03/09/20  BUN  Result Value Ref Range   BUN 18 8 - 23 mg/dL  Creatinine, serum  Result Value Ref Range   Creatinine, Ser 0.80 0.44 - 1.00 mg/dL   GFR, Estimated >60 >60 mL/min      Assessment & Plan:   Problem List Items Addressed This Visit   None   Visit Diagnoses    Acute neck pain    -  Primary   Relevant Medications   cyclobenzaprine (FLEXERIL) 10 MG tablet   Trapezius muscle spasm       Relevant Medications   cyclobenzaprine (FLEXERIL) 10 MG tablet   Nausea       Relevant Medications   ondansetron (ZOFRAN ODT) 4 MG disintegrating tablet      Consistent with Left acute trapezius muscle spasm with radiating neck to shoulder pain. Possible strain with turning on initial onset of pain. No neurological deficits or weakness.  Plan: 1. Flexeril 5-10mg  TID PRN prefer QHS, caution  sedation 3. Recommend regular use heating pad / moist heat, stretching, avoid heavy lifting / repetitive activities, Tylenol PRN 4. RTC 2-4 weeks for re-evaluation   Meds ordered this encounter  Medications  . cyclobenzaprine (FLEXERIL) 10 MG tablet    Sig: Take 0.5-1 tablets (5-10 mg total) by mouth 3 (three) times daily as needed for muscle spasms.    Dispense:  30 tablet    Refill:  2  . ondansetron (ZOFRAN ODT) 4 MG disintegrating tablet    Sig: Take 1 tablet (4 mg total) by mouth every 8 (eight) hours as needed for nausea or vomiting.    Dispense:  30 tablet    Refill:  0      Follow up plan: Return in about 4 weeks (around 05/07/2020), or if symptoms worsen or fail to improve, for neck spasm.    Nobie Putnam, [facility] 04/09/2020, 3:56 PM

## 2020-04-12 ENCOUNTER — Telehealth: Payer: Self-pay

## 2020-04-12 ENCOUNTER — Other Ambulatory Visit: Payer: Self-pay | Admitting: Family Medicine

## 2020-04-12 DIAGNOSIS — M542 Cervicalgia: Secondary | ICD-10-CM

## 2020-04-12 DIAGNOSIS — M62838 Other muscle spasm: Secondary | ICD-10-CM

## 2020-04-12 MED ORDER — BACLOFEN 10 MG PO TABS: 5.0000 mg | ORAL_TABLET | Freq: Three times a day (TID) | ORAL | 1 refills | Status: DC | PRN

## 2020-04-12 MED ORDER — PREDNISONE 10 MG PO TABS: ORAL_TABLET | ORAL | 0 refills | Status: DC

## 2020-04-12 NOTE — Telephone Encounter (Signed)
Just spoke with the patient. Since she is having nausea with the Flexeril, I advised her to stop it and that Dr. Raliegh Ip will be sending in something else to her pharmacy.

## 2020-04-12 NOTE — Telephone Encounter (Signed)
Patient was just in for appointment for the neck pain. Patient states that it has not gotten any better and would like to know what else she can do. CB# [phone]

## 2020-04-12 NOTE — Telephone Encounter (Deleted)
Can you call her back? This is very open ended, I am not quite sure what she would like to do.  We tried Flexeril muscle relaxant - is it not working?  The muscle strain/spasm will take time to heal, I would not expect her to be better in 3 days. It would probably take a few weeks.  Is she wanting other medication?  We could try a steroid course with Prednisone as an anti inflammatory for pain.  Let me know  Nobie Putnam, [facility] 04/12/2020, 2:44 PM

## 2020-04-12 NOTE — Telephone Encounter (Signed)
I called pt to let her know about Dr Marthann Schiller recommendations that he had notified me in a secure chat message. I had to leave a voicemail so I will wait for her to call us back.

## 2020-04-13 ENCOUNTER — Encounter: Payer: Self-pay | Admitting: Family Medicine

## 2020-04-16 ENCOUNTER — Telehealth: Payer: Self-pay

## 2020-04-16 DIAGNOSIS — M542 Cervicalgia: Secondary | ICD-10-CM

## 2020-04-16 DIAGNOSIS — M62838 Other muscle spasm: Secondary | ICD-10-CM

## 2020-04-16 NOTE — Telephone Encounter (Signed)
Pt called me with an update of her symptoms.   She said she is still not feeling enough relief with the muscle relaxer and prednisone that we prescribed earlier this week.   She said that she has one prednisone to take tomorrow but she hates taking the muscle relaxer because all it does is put her to sleep.   She said the pain has decreased just a tiny bit but she is still having trouble turning her head.   She plans to go back to work this upcoming Monday but would like to know what else she can possibly do/take. Please advise

## 2020-04-16 NOTE — Telephone Encounter (Signed)
Referral sent to [facility]  Tylenol + Ibuprofen  Orders Placed This Encounter  Procedures  . Ambulatory referral to Physical Therapy    Referral Priority:   Routine    Referral Type:   Physical Medicine    Referral Reason:   Specialty Services Required    Requested Specialty:   Physical Therapy    Number of Visits Requested:   1   If she needs work note on Monday let me know  Nobie Putnam, [facility] 04/16/2020, 4:14 PM

## 2020-04-16 NOTE — Telephone Encounter (Signed)
Spoke with the pt and she agrees that PT is the next step. She is fine with a referral to [facility] and she said that she will try the tylenol and ibuprofen throughout the weekend and call me Monday with an update. She is still wanting to go to work on Monday but I told her that if she is unable, that we can write her another work note. She said she appreciated [place] both in trying to get her better.

## 2020-04-16 NOTE — Telephone Encounter (Signed)
At this point I would recommend a Physical Therapy evaluation.  Medicines do not seem to be resolving her symptoms and I do not have any more medication options really, I would recommend a physical evaluation by PT and treatment from them next.  I can handwrite a [facility] order she can pick up and schedule when ready, or we can submit a referral through [facility], through [facility] but I am not sure on the wait time for referral.  Nobie Putnam, [facility] 04/16/2020, 3:21 PM

## 2020-05-19 DIAGNOSIS — H40013 Open angle with borderline findings, low risk, bilateral: Secondary | ICD-10-CM | POA: Diagnosis not present

## 2020-05-26 ENCOUNTER — Ambulatory Visit: Payer: Self-pay | Admitting: *Deleted

## 2020-05-26 NOTE — Telephone Encounter (Signed)
  Pt called in c/o having a sinus infection since last Wednesday and having dizziness with it.   No fever.   Has not done a Covid test.   I encouraged her to test or be tested for Covid which she has agreed to do  She normally sees Dr. Parks Ranger however he doesn't have any appts this week so I made her a MyChart virtual visit appt with Webb Silversmith, NP for 05/27/2020 at 11:20.  Covid questionnaire completed.  I sent my notes to [facility].   Reason for Disposition . [1] MODERATE dizziness (e.g., interferes with normal activities) AND [2] has NOT been evaluated by physician for this  (Exception: dizziness caused by heat exposure, sudden standing, or poor fluid intake)  Answer Assessment - Initial Assessment Questions 1. DESCRIPTION: "Describe your dizziness."     I have a bad sinus infection since last Wednesday and I'm dizzy. 2. LIGHTHEADED: "Do you feel lightheaded?" (e.g., somewhat faint, woozy, weak upon standing)     No fainting.   3. VERTIGO: "Do you feel like either you or the room is spinning or tilting?" (i.e. vertigo)     Vertigo many years ago 4. SEVERITY: "How bad is it?"  "Do you feel like you are going to faint?" "Can you stand and walk?"   - MILD: Feels slightly dizzy, but walking normally.   - MODERATE: Feels unsteady when walking, but not falling; interferes with normal activities (e.g., school, work).   - SEVERE: Unable to walk without falling, or requires assistance to walk without falling; feels like passing out now.      Dizziness happens when I get up from sitting or laying down.  I'm stuffed up so I'm using a nasal spray.  I'm using warm salt water flushes in my nose too. 5. ONSET:  "When did the dizziness begin?"     Last Wed. 6. AGGRAVATING FACTORS: "Does anything make it worse?" (e.g., standing, change in head position)     Getting up from sitting and laying down 7. HEART RATE: "Can you tell me your heart rate?" "How many beats in 15 seconds?"   (Note: not all patients can do this)       N/A 8. CAUSE: "What do you think is causing the dizziness?"     Sinus infection 9. RECURRENT SYMPTOM: "Have you had dizziness before?" If Yes, ask: "When was the last time?" "What happened that time?"     Many many years ago 10. OTHER SYMPTOMS: "Do you have any other symptoms?" (e.g., fever, chest pain, vomiting, diarrhea, bleeding)       Stuffy nose only   No fever 11. PREGNANCY: "Is there any chance you are pregnant?" "When was your last menstrual period?"       N/A  Protocols used: DIZZINESS [facility]

## 2020-05-26 NOTE — Telephone Encounter (Signed)
Will discuss at upcoming appt.

## 2020-05-27 ENCOUNTER — Telehealth (INDEPENDENT_AMBULATORY_CARE_PROVIDER_SITE_OTHER): Payer: PPO | Admitting: Internal Medicine

## 2020-05-27 ENCOUNTER — Encounter: Payer: Self-pay | Admitting: Internal Medicine

## 2020-05-27 ENCOUNTER — Other Ambulatory Visit: Payer: Self-pay | Admitting: Neurosurgery

## 2020-05-27 ENCOUNTER — Other Ambulatory Visit (HOSPITAL_COMMUNITY): Payer: Self-pay | Admitting: Neurosurgery

## 2020-05-27 VITALS — BP 120/77 | Temp 97.6°F

## 2020-05-27 DIAGNOSIS — M545 Low back pain, unspecified: Secondary | ICD-10-CM | POA: Diagnosis not present

## 2020-05-27 DIAGNOSIS — G8929 Other chronic pain: Secondary | ICD-10-CM

## 2020-05-27 DIAGNOSIS — M5442 Lumbago with sciatica, left side: Secondary | ICD-10-CM | POA: Diagnosis not present

## 2020-05-27 DIAGNOSIS — M4310 Spondylolisthesis, site unspecified: Secondary | ICD-10-CM | POA: Diagnosis not present

## 2020-05-27 DIAGNOSIS — B9689 Other specified bacterial agents as the cause of diseases classified elsewhere: Secondary | ICD-10-CM

## 2020-05-27 DIAGNOSIS — J019 Acute sinusitis, unspecified: Secondary | ICD-10-CM

## 2020-05-27 DIAGNOSIS — M5416 Radiculopathy, lumbar region: Secondary | ICD-10-CM

## 2020-05-27 MED ORDER — AMOXICILLIN-POT CLAVULANATE 875-125 MG PO TABS: 1.0000 | ORAL_TABLET | Freq: Two times a day (BID) | ORAL | 0 refills | Status: DC

## 2020-05-27 NOTE — Progress Notes (Signed)
Virtual Visit via Video Note  I connected with Amber Brooks on 05/27/20 at 11:20 AM EDT by a video enabled telemedicine application and verified that I am speaking with the correct person using two identifiers.  Location: Patient: Home Provider: Office  Persons participating in this video call: Webb Silversmith NP and Merrily Pew.   I discussed the limitations of evaluation and management by telemedicine and the availability of in person appointments. The patient expressed understanding and agreed to proceed.  History of Present Illness:  Pt reports headache, sinus pressure and nasal congestion. This started 1 week ago. The headache is located in her forehead. She describes the pain as pressure. She is not blowing much mucus out of her nose. She denies runny nose, ear pain, sore throat or cough. She denies fever, chills or body aches. She has tried Flonase and sinus rinse with minimal relief of symptoms. She has a history of sinus infections and reports this feels the same. She has had a negative home Covid test.   Past Medical History:  Diagnosis Date  . Anxiety   . 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 Brain aneurysm   . Complication of anesthesia   . Depression   . GERD (gastroesophageal reflux disease)   . Headache(784.0)    migraines  . Hypertension   . Mitral valve prolapse   . PONV (postoperative nausea and vomiting)     Current Outpatient Medications  Medication Sig Dispense Refill  . amLODipine (NORVASC) 10 MG tablet Take 10 mg by mouth daily.    Marland Kitchen aspirin EC 81 MG tablet Take 1 tablet (81 mg total) by mouth daily. Swallow whole. 90 tablet 3  . baclofen (LIORESAL) 10 MG tablet Take 0.5-1 tablets (5-10 mg total) by mouth 3 (three) times daily as needed for muscle spasms. Stop Flexeril, switch to this muscle relaxant. 30 each 1  . carvedilol (COREG) 25 MG tablet Take 1 tablet (25 mg total) by mouth 2 (two) times daily. 60 tablet 5  . cholecalciferol (VITAMIN D3) 25 MCG (1000 UNIT)  tablet Take 1,000 Units by mouth daily.    Marland Kitchen esomeprazole (NEXIUM) 40 MG capsule Take 40 mg by mouth daily before breakfast.    . ibuprofen (ADVIL) 800 MG tablet Take 1 tablet (800 mg total) by mouth every 8 (eight) hours as needed for headache. 90 tablet 2  . Multiple Vitamin (MULTIVITAMIN WITH MINERALS) TABS tablet Take 1 tablet by mouth daily.    . NON FORMULARY Take 1 capsule by mouth daily. Tru Niagen    . NON FORMULARY Take 1 capsule by mouth daily. Cordyceps    . Omega 3 1000 MG CAPS Take 1,000 mg by mouth daily.    . ondansetron (ZOFRAN ODT) 4 MG disintegrating tablet Take 1 tablet (4 mg total) by mouth every 8 (eight) hours as needed for nausea or vomiting. 30 tablet 0  . OVER THE COUNTER MEDICATION Take 1 tablet by mouth daily. Appetite support    . predniSONE (DELTASONE) 10 MG tablet Take 6 tabs with breakfast Day 1, 5 tabs Day 2, 4 tabs Day 3, 3 tabs Day 4, 2 tabs Day 5, 1 tab Day 6. Anti inflammatory for pain. Do not take other NSAID. 21 tablet 0  . Probiotic Product (PROBIOTIC-10 PO) Take 1 capsule by mouth daily.    . rosuvastatin (CRESTOR) 10 MG tablet Take 1 tablet (10 mg total) by mouth at bedtime. 90 tablet 3  . traZODone (DESYREL) 50 MG tablet TAKE 1 TABLET BY MOUTH  AT BEDTIME 90 tablet 1  . venlafaxine XR (EFFEXOR-XR) 150 MG 24 hr capsule Take 150 mg by mouth daily with breakfast. 90 capsule 3  . vitamin B-12 (CYANOCOBALAMIN) 1000 MCG tablet Take 1,000 mcg by mouth daily.    . vitamin C (ASCORBIC ACID) 500 MG tablet Take 500 mg by mouth daily.     No current facility-administered medications for this visit.    Allergies  Allergen Reactions  . Latex Anaphylaxis  . Codeine Nausea Only  . Hydromorphone Itching    Family History  Problem Relation Age of Onset  . Dementia Father   . Dementia Brother     Social History   Socioeconomic History  . Marital status: Divorced    Spouse name: Not on file  . Number of children: Not on file  . Years of education: Not on  file  . Highest education level: Not on file  Occupational History  . Not on file  Tobacco Use  . Smoking status: Never Smoker  . Smokeless tobacco: Never Used  Vaping Use  . Vaping Use: Never used  Substance and Sexual Activity  . Alcohol use: Yes    Alcohol/week: 1.0 standard drink    Types: 1 Glasses of wine per week    Comment: every evening  . Drug use: No  . Sexual activity: Not Currently  Other Topics Concern  . Not on file  Social History Narrative   Right handed.  Caffeine decaf 1 cup in am.  Divorced.  1 adopted daughter.  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    Social Determinants of Health   Financial Resource Strain: Not on file  Food Insecurity: Not on file  Transportation Needs: Not on file  Physical Activity: Not on file  Stress: Not on file  Social Connections: Not on file  Intimate Partner Violence: Not on file     Constitutional: Pt reports headache. Denies fever, malaise, fatigue, or abrupt weight changes.  HEENT: Pt reports facial pain and pressure, nasal congestion. Denies eye pain, eye redness, ear pain, ringing in the ears, wax buildup, runny nose, bloody nose, or sore throat. Respiratory: Denies difficulty breathing, shortness of breath, cough or sputum production.    No other specific complaints in a complete review of systems (except as listed in HPI above).    Observations/Objective:  Wt Readings from Last 3 Encounters:  04/09/20 120 lb 9.6 oz (54.7 kg)  03/22/20 119 lb (54 kg)  03/09/20 119 lb (54 kg)    General: Appears her stated age, well developed, well nourished in NAD. HEENT: Head: normal shape and size, frontal sinus pressure noted; Nose:congestion noted; Throat/Mouth: no hoarseness noted Pulmonary/Chest: Normal effort. No respiratory distress.  Neurological: Alert and oriented.   BMET    Component Value Date/Time   NA 141 02/11/2020 0852   NA 144 01/12/2020 1438   K 4.2 02/11/2020 0852   CL 103 02/11/2020 0852   CO2 29 02/11/2020 0852    GLUCOSE 103 (H) 02/11/2020 0852   BUN 18 03/09/2020 1351   BUN 15 01/12/2020 1438   CREATININE 0.80 03/09/2020 1351   CREATININE 0.72 02/11/2020 0852   CALCIUM 9.5 02/11/2020 0852   GFRNONAA >60 03/09/2020 1351   GFRAA 90 01/12/2020 1438    Lipid Panel     Component Value Date/Time   CHOL 176 02/11/2020 0852   TRIG 90 02/11/2020 0852   HDL 82 02/11/2020 0852   CHOLHDL 2.1 02/11/2020 0852   VLDL 33 04/07/2014 0425   LDLCALC 77  02/11/2020 0852    CBC    Component Value Date/Time   WBC 7.3 02/11/2020 0852   RBC 4.33 02/11/2020 0852   HGB 13.7 02/11/2020 0852   HCT 40.6 02/11/2020 0852   PLT 267 02/11/2020 0852   MCV 93.8 02/11/2020 0852   MCH 31.6 02/11/2020 0852   MCHC 33.7 02/11/2020 0852   RDW 11.8 02/11/2020 0852   LYMPHSABS 1,321 02/11/2020 0852   MONOABS 0.4 04/26/2014 1055   EOSABS 299 02/11/2020 0852   BASOSABS 51 02/11/2020 0852    Hgb A1C Lab Results  Component Value Date   HGBA1C 5.1 02/11/2020       Assessment and Plan:  Acute Bacterial Sinusitis:  Continue Flonase and sinus rinse as needed RX for Augmentin 875-125 mg PO BID x 10 days  Return precautions discussed Follow Up Instructions:    I discussed the assessment and treatment plan with the patient. The patient was provided an opportunity to ask questions and all were answered. The patient agreed with the plan and demonstrated an understanding of the instructions.   The patient was advised to call back or seek an in-person evaluation if the symptoms worsen or if the condition fails to improve as anticipated.     Webb Silversmith, NP

## 2020-05-27 NOTE — Patient Instructions (Signed)

## 2020-06-06 ENCOUNTER — Ambulatory Visit
Admission: RE | Admit: 2020-06-06 | Discharge: 2020-06-06 | Disposition: A | Discharge status: Home / Self Care | Payer: PPO | Source: Ambulatory Visit | Attending: Neurosurgery | Admitting: Neurosurgery

## 2020-06-06 ENCOUNTER — Other Ambulatory Visit: Payer: Self-pay

## 2020-06-06 DIAGNOSIS — G8929 Other chronic pain: Secondary | ICD-10-CM | POA: Diagnosis not present

## 2020-06-06 DIAGNOSIS — M4310 Spondylolisthesis, site unspecified: Secondary | ICD-10-CM | POA: Insufficient documentation

## 2020-06-06 DIAGNOSIS — M5442 Lumbago with sciatica, left side: Secondary | ICD-10-CM | POA: Insufficient documentation

## 2020-06-06 DIAGNOSIS — M5416 Radiculopathy, lumbar region: Secondary | ICD-10-CM | POA: Diagnosis not present

## 2020-06-06 DIAGNOSIS — M545 Low back pain, unspecified: Secondary | ICD-10-CM | POA: Diagnosis not present

## 2020-06-22 ENCOUNTER — Ambulatory Visit (INDEPENDENT_AMBULATORY_CARE_PROVIDER_SITE_OTHER): Payer: PPO | Admitting: Family Medicine

## 2020-06-22 ENCOUNTER — Encounter: Payer: Self-pay | Admitting: Family Medicine

## 2020-06-22 ENCOUNTER — Other Ambulatory Visit: Payer: Self-pay

## 2020-06-22 VITALS — BP 157/58 | HR 71 | Ht 62.0 in | Wt 119.6 lb

## 2020-06-22 DIAGNOSIS — R42 Dizziness and giddiness: Secondary | ICD-10-CM

## 2020-06-22 NOTE — Progress Notes (Signed)
Subjective:    Patient ID: Betha Loa, female    DOB: 09/22/1945, 75 y.o.   MRN: [id number]  ANNALYCIA DONE is a 75 y.o. female presenting on 06/22/2020 for Neck Pain and Dizziness   HPI  Vertigo Headache / Neck Spasm  Reports recent course: - Initially 04/09/20 she had neck pain and trapezius muscle spasm and trial on flexeril and other medication, limited relief, also referred to PT has not been scheduled yet  - with virtual video call with Webb Silversmith, FNP here at [facility] 05/27/20, she thought more sinusitis causing her symptoms triggering the vertigo. She was given Augmentin and Flonase  - Interval update and limited improvement on these treatments  - Currently now experiencing significant episodes of vertigo can trigger with quick movements of turning head or sitting up from laying down. Associated with nausea. Not taken the Zofran pills yet.  Admits seasonal allergies, using flonase OTC generic 1 spray each nostril daily  She has upcoming schedule with Dr Izora Ribas for lumbar spine injections for treatment.  Admits suboccipital headache   Depression screen [facility] 2/9 06/22/2020 12/18/2019  Decreased Interest 0 0  Down, Depressed, Hopeless 0 0  PHQ - 2 Score 0 0  Altered sleeping 0 1  Tired, decreased energy 0 0  Change in appetite 0 0  Feeling bad or failure about yourself  0 0  Trouble concentrating 0 0  Moving slowly or fidgety/restless 0 0  Suicidal thoughts 0 0  PHQ-9 Score 0 1  Difficult doing work/chores Not difficult at all Not difficult at all    Social History   Tobacco Use   Smoking status: Never   Smokeless tobacco: Never  Vaping Use   Vaping Use: Never used  Substance Use Topics   Alcohol use: Yes    Alcohol/week: 1.0 standard drink    Types: 1 Glasses of wine per week    Comment: every evening   Drug use: No    Review of Systems Per HPI unless specifically indicated above     Objective:    BP (!) 157/58   Pulse 71   Ht 5\' 2"  (1.575  m)   Wt 119 lb 9.6 oz (54.3 kg)   SpO2 98%   BMI 21.88 kg/m   Wt Readings from Last 3 Encounters:  06/22/20 119 lb 9.6 oz (54.3 kg)  04/09/20 120 lb 9.6 oz (54.7 kg)  03/22/20 119 lb (54 kg)    Physical Exam Vitals and nursing note reviewed.  Constitutional:      General: She is not in acute distress.    Appearance: Normal appearance. She is well-developed. She is not diaphoretic.     Comments: Well-appearing, comfortable, cooperative  HENT:     Head: Normocephalic and atraumatic.     Right Ear: Tympanic membrane, ear canal and external ear normal. There is no impacted cerumen.     Left Ear: Tympanic membrane, ear canal and external ear normal. There is no impacted cerumen.     Ears:     Comments: Mild hazy appearance R TM but not consistent with bulging or effusion Eyes:     General:        Right eye: No discharge.        Left eye: No discharge.     Conjunctiva/sclera: Conjunctivae normal.  Neck:     Comments: Muscle spasm and hypertonicity sub occipital cervical spine, into upper back trapezius Reduced ROM rotation due to vertigo  Cardiovascular:  Rate and Rhythm: Normal rate.  Pulmonary:     Effort: Pulmonary effort is normal.  Skin:    General: Skin is warm and dry.     Findings: No erythema or rash.  Neurological:     General: No focal deficit present.     Mental Status: She is alert and oriented to person, place, and time. Mental status is at baseline.  Psychiatric:        Mood and Affect: Mood normal.        Behavior: Behavior normal.        Thought Content: Thought content normal.     Comments: Well groomed, good eye contact, normal speech and thoughts   Results for orders placed or performed during the hospital encounter of 03/09/20  BUN  Result Value Ref Range   BUN 18 8 - 23 mg/dL  Creatinine, serum  Result Value Ref Range   Creatinine, Ser 0.80 0.44 - 1.00 mg/dL   GFR, Estimated >60 >60 mL/min      Assessment & Plan:   Problem List Items  Addressed This Visit   None Visit Diagnoses     Vertigo    -  Primary       Suspected BPPV by history supported by positive history of reproduced symptoms. - No other significant neurological findings or focal deficits  Plan: 1. Handout given with Epley maneuver TID for 1-2 weeks until resolved 2. Offer meclizine PRN for breakthrough symptoms she declines 3. Return criteria, if not improved consider vestibular PT referral  She has upcoming PT for her neck/trapezius - she can discuss vertigo with PT as well. Future may reconsider ENT as option  Lastly she has upcoming injection for her lumbar spine radiculitis with Dr Izora Ribas, may benefit from further eval of C-spine as well maybe it is related  No orders of the defined types were placed in this encounter.     Follow up plan: Return if symptoms worsen or fail to improve.    Nobie Putnam, [facility] 06/22/2020, 4:37 PM

## 2020-06-22 NOTE — Patient Instructions (Addendum)
Thank you for coming to the office today.  1. You have symptoms of Vertigo (Benign Paroxysmal Positional Vertigo) - This is commonly caused by inner ear fluid imbalance, sometimes can be worsened by allergies and sinus symptoms, otherwise it can occur randomly sometimes and we may never discover the exact cause. - To treat this, try the Epley Manuever (see diagrams/instructions below) at home up to 3 times a day for 1-2 weeks or until symptoms resolve - You may take Meclizine as needed up to 3 times a day for dizziness, this will not cure symptoms but may help. Caution may make you drowsy.  If you develop significant worsening episode with vertigo that does not improve and you get severe headache, loss of vision, arm or leg weakness, slurred speech, or other concerning symptoms please seek immediate medical attention at Emergency Department.  Please schedule a follow-up appointment with Dr Parks Ranger within 4 weeks if Vertigo not improving, and will consider Referral to Vestibular Rehab  See the next page for images describing the Epley Manuever.     ----------------------------------------------------------------------------------------------------------------------         Please schedule a Follow-up Appointment to: Return if symptoms worsen or fail to improve.  If you have any other questions or concerns, please feel free to call the office or send a message through [place]. You may also schedule an earlier appointment if necessary.  Additionally, you may be receiving a survey about your experience at our office within a few days to 1 week by e-mail or mail. We value your feedback.  Nobie Putnam, DO [place]

## 2020-06-29 ENCOUNTER — Telehealth: Payer: Self-pay

## 2020-06-29 DIAGNOSIS — J0110 Acute frontal sinusitis, unspecified: Secondary | ICD-10-CM

## 2020-06-29 MED ORDER — AMOXICILLIN-POT CLAVULANATE 875-125 MG PO TABS: 1.0000 | ORAL_TABLET | Freq: Two times a day (BID) | ORAL | 0 refills | Status: DC

## 2020-06-29 NOTE — Telephone Encounter (Signed)
Seen 1 week ago approx. Agree to send Augmentin. Follow up if not improved  Amber Brooks, [facility] 06/29/2020, 9:15 AM

## 2020-06-29 NOTE — Telephone Encounter (Signed)
Pt was seen last week for vertigo symptoms. She called me this morning stating that her sinuses were swollen and congested. She asked that something be called in for it to help. Please advise.

## 2020-07-13 DIAGNOSIS — M5416 Radiculopathy, lumbar region: Secondary | ICD-10-CM | POA: Diagnosis not present

## 2020-07-13 DIAGNOSIS — M48062 Spinal stenosis, lumbar region with neurogenic claudication: Secondary | ICD-10-CM | POA: Diagnosis not present

## 2020-07-14 ENCOUNTER — Other Ambulatory Visit: Payer: Self-pay

## 2020-07-14 DIAGNOSIS — F3342 Major depressive disorder, recurrent, in full remission: Secondary | ICD-10-CM

## 2020-07-14 MED ORDER — VENLAFAXINE HCL ER 150 MG PO CP24: 150.0000 mg | ORAL_CAPSULE | Freq: Every day | ORAL | 3 refills | Status: DC

## 2020-08-11 ENCOUNTER — Other Ambulatory Visit: Payer: Self-pay

## 2020-08-11 ENCOUNTER — Encounter: Payer: Self-pay | Admitting: Family Medicine

## 2020-08-11 ENCOUNTER — Ambulatory Visit (INDEPENDENT_AMBULATORY_CARE_PROVIDER_SITE_OTHER): Payer: PPO | Admitting: Family Medicine

## 2020-08-11 ENCOUNTER — Other Ambulatory Visit: Payer: Self-pay | Admitting: Family Medicine

## 2020-08-11 VITALS — BP 150/65 | HR 72 | Ht 62.0 in | Wt 119.6 lb

## 2020-08-11 DIAGNOSIS — G44311 Acute post-traumatic headache, intractable: Secondary | ICD-10-CM | POA: Diagnosis not present

## 2020-08-11 DIAGNOSIS — S0990XA Unspecified injury of head, initial encounter: Secondary | ICD-10-CM | POA: Diagnosis not present

## 2020-08-11 DIAGNOSIS — I10 Essential (primary) hypertension: Secondary | ICD-10-CM

## 2020-08-11 MED ORDER — AMLODIPINE BESYLATE 10 MG PO TABS: 10.0000 mg | ORAL_TABLET | Freq: Every day | ORAL | 3 refills | Status: DC

## 2020-08-11 NOTE — Progress Notes (Signed)
Subjective:    Patient ID: Amber Brooks, female    DOB: 1945/01/19, 75 y.o.   MRN: [id number]  Amber Brooks is a 75 y.o. female presenting on 08/11/2020 for Head Injury and Headache   HPI  Headache / Contusion Head injury   Yesterday she was at work and at [place] 08/10/20, she left a cabinet door open above, and she bent down and came back up and hit her head left front / temporal region directly on the cabinet door corner, immediate pain, without laceration or bleeding. She had pain immediately then has had episodes or waves of pain for several seconds severe pain in that localized area.  - Took Ibuprofen '800mg'$  PRN this AM, some relief with pain relief from headache  She was able to work after injury she left early today then asked her to go get evaluated.  Today she admits having swollen bump on head point of impact. Some improved.  - Admits L eye watering, blurry vision. - Denies loss of vision or eye pain - Denies any numbness tingling weakness, nausea vomiting, weakness   Depression screen [facility] 2/9 06/22/2020 12/18/2019  Decreased Interest 0 0  Down, Depressed, Hopeless 0 0  PHQ - 2 Score 0 0  Altered sleeping 0 1  Tired, decreased energy 0 0  Change in appetite 0 0  Feeling bad or failure about yourself  0 0  Trouble concentrating 0 0  Moving slowly or fidgety/restless 0 0  Suicidal thoughts 0 0  PHQ-9 Score 0 1  Difficult doing work/chores Not difficult at all Not difficult at all    Social History   Tobacco Use   Smoking status: Never   Smokeless tobacco: Never  Vaping Use   Vaping Use: Never used  Substance Use Topics   Alcohol use: Yes    Alcohol/week: 1.0 standard drink    Types: 1 Glasses of wine per week    Comment: every evening   Drug use: No    Review of Systems Per HPI unless specifically indicated above     Objective:    BP (!) 150/65   Pulse 72   Ht '5\' 2"'$  (1.575 m)   Wt 119 lb 9.6 oz (54.3 kg)   SpO2 100%   BMI 21.88 kg/m   Wt  Readings from Last 3 Encounters:  08/11/20 119 lb 9.6 oz (54.3 kg)  06/22/20 119 lb 9.6 oz (54.3 kg)  04/09/20 120 lb 9.6 oz (54.7 kg)    Physical Exam Vitals and nursing note reviewed.  Constitutional:      General: She is not in acute distress.    Appearance: She is well-developed. She is not diaphoretic.     Comments: Well-appearing, comfortable, cooperative  HENT:     Head: Normocephalic and atraumatic.  Eyes:     General:        Right eye: No discharge.        Left eye: No discharge.     Conjunctiva/sclera: Conjunctivae normal.  Neck:     Thyroid: No thyromegaly.  Cardiovascular:     Rate and Rhythm: Normal rate.  Pulmonary:     Effort: Pulmonary effort is normal. No respiratory distress.  Musculoskeletal:        General: Normal range of motion.     Cervical back: Normal range of motion and neck supple.  Lymphadenopathy:     Cervical: No cervical adenopathy.  Skin:    General: Skin is warm and dry.  Findings: No erythema or rash.  Neurological:     Mental Status: She is alert and oriented to person, place, and time. Mental status is at baseline.     Cranial Nerves: No cranial nerve deficit.     Sensory: No sensory deficit.     Motor: No weakness.  Psychiatric:        Behavior: Behavior normal.     Comments: Well groomed, good eye contact, normal speech and thoughts      Results for orders placed or performed during the hospital encounter of 03/09/20  BUN  Result Value Ref Range   BUN 18 8 - 23 mg/dL  Creatinine, serum  Result Value Ref Range   Creatinine, Ser 0.80 0.44 - 1.00 mg/dL   GFR, Estimated >60 >60 mL/min      Assessment & Plan:   Problem List Items Addressed This Visit   None Visit Diagnoses     Intractable acute post-traumatic headache    -  Primary   Injury of head, initial encounter           Head injury today Contusion localized to Left frontal region, has localized swelling but no ecchymosis or laceration Reassuring history  without any focal neuro deficits or red flag symptoms within 24 hours of injury Not on aspirin or blood thinner History of MCA aneurysm in past 2014 repaired clipped.  Advised ice packs, monitoring, note for work. Reassurance today, may take NSAID PRN  Return criteria given to monitor within next 72 hours and within 1 week. Reviewed when to go to hospital ED, advised we will hold off on CT imaging today based on well appearance and negative red flags. No evidence of fracture appears to be contusion at this time.  No orders of the defined types were placed in this encounter.     Follow up plan: Return if symptoms worsen or fail to improve.   Nobie Putnam, [facility] 08/11/2020, 4:23 PM

## 2020-08-11 NOTE — Patient Instructions (Addendum)
Thank you for coming to the office today.  Overall likely contusion that will heal Use ice packs as needed for swelling Ibuprofen as needed for headache.  Symptoms to be on the look out for - warning signs if more severe problem or injury, risk of fracture of skull that can cause bleeding and serious complications  - Loss of consciousness, concussion, memory loss confusion, nausea vomiting, loss of vision, neuro deficit weakness numbness arm or leg or face, stroke symptoms.   Seek care at hospital immediately for evaluation may need CT scan of head  If clear for next 72 hours should be good but still monitor symptoms for up to 2 weeks approximately.   Please schedule a Follow-up Appointment to: Return if symptoms worsen or fail to improve.  If you have any other questions or concerns, please feel free to call the office or send a message through [facility]. You may also schedule an earlier appointment if necessary.  Additionally, you may be receiving a survey about your experience at our office within a few days to 1 week by e-mail or mail. We value your feedback.  Nobie Putnam, DO [place]

## 2020-08-12 ENCOUNTER — Telehealth: Payer: Self-pay | Admitting: Family Medicine

## 2020-08-12 DIAGNOSIS — G43109 Migraine with aura, not intractable, without status migrainosus: Secondary | ICD-10-CM

## 2020-08-12 MED ORDER — RIZATRIPTAN BENZOATE 10 MG PO TABS: 10.0000 mg | ORAL_TABLET | ORAL | 2 refills | Status: DC | PRN

## 2020-08-12 NOTE — Telephone Encounter (Signed)
Called patient back. She called with migraine headache. See note from yesterday. She used ice packs those helped but still has bump on head.  No other new symptoms  Now has typical migraine, she used to take other med for migraine in past needs new order  Sent in Rizatriptan PRN dosing and she can take Zofran ODT PRN  If not improving or worsening symptoms next step is seek care at Urgent Care or ED  Nobie Putnam, [facility] 08/12/2020, 12:10 PM

## 2020-08-30 ENCOUNTER — Ambulatory Visit: Payer: PPO | Admitting: Cardiology

## 2020-08-31 ENCOUNTER — Telehealth: Payer: Self-pay

## 2020-08-31 DIAGNOSIS — R634 Abnormal weight loss: Secondary | ICD-10-CM

## 2020-08-31 DIAGNOSIS — R197 Diarrhea, unspecified: Secondary | ICD-10-CM

## 2020-08-31 NOTE — Addendum Note (Signed)
Addended by: Jearld Fenton on: 08/31/2020 09:06 AM   Modules accepted: Orders

## 2020-08-31 NOTE — Telephone Encounter (Signed)
Patient called asking for a referral to [facility].   She has recently been experiencing severe GERD, weight loss, and diarrhea.   She said she thinks her last colonoscopy was about four years ago.   I mentioned that Dr. Raliegh Ip will not be back in office until tomorrow, (Wednesday) and she said that was fine to wait until then.  Please advise. Thank you

## 2020-08-31 NOTE — Telephone Encounter (Signed)
Referral to GI placed

## 2020-09-15 DIAGNOSIS — R110 Nausea: Secondary | ICD-10-CM | POA: Diagnosis not present

## 2020-09-15 DIAGNOSIS — R634 Abnormal weight loss: Secondary | ICD-10-CM | POA: Diagnosis not present

## 2020-09-15 DIAGNOSIS — R197 Diarrhea, unspecified: Secondary | ICD-10-CM | POA: Diagnosis not present

## 2020-09-16 DIAGNOSIS — R197 Diarrhea, unspecified: Secondary | ICD-10-CM | POA: Diagnosis not present

## 2020-09-16 DIAGNOSIS — R110 Nausea: Secondary | ICD-10-CM | POA: Diagnosis not present

## 2020-09-18 ENCOUNTER — Other Ambulatory Visit: Payer: Self-pay | Admitting: Family Medicine

## 2020-09-18 DIAGNOSIS — F3342 Major depressive disorder, recurrent, in full remission: Secondary | ICD-10-CM

## 2020-09-20 ENCOUNTER — Ambulatory Visit
Admission: RE | Admit: 2020-09-20 | Discharge: 2020-09-20 | Disposition: A | Discharge status: Home / Self Care | Payer: PPO | Source: Ambulatory Visit | Attending: Gastroenterology | Admitting: Gastroenterology

## 2020-09-20 ENCOUNTER — Other Ambulatory Visit: Payer: Self-pay | Admitting: Gastroenterology

## 2020-09-20 ENCOUNTER — Other Ambulatory Visit: Payer: Self-pay

## 2020-09-20 DIAGNOSIS — R110 Nausea: Secondary | ICD-10-CM | POA: Insufficient documentation

## 2020-09-20 DIAGNOSIS — N289 Disorder of kidney and ureter, unspecified: Secondary | ICD-10-CM | POA: Diagnosis not present

## 2020-09-20 DIAGNOSIS — R634 Abnormal weight loss: Secondary | ICD-10-CM

## 2020-09-20 DIAGNOSIS — R197 Diarrhea, unspecified: Secondary | ICD-10-CM | POA: Diagnosis not present

## 2020-09-20 DIAGNOSIS — M4316 Spondylolisthesis, lumbar region: Secondary | ICD-10-CM | POA: Diagnosis not present

## 2020-09-20 MED ORDER — IOHEXOL 350 MG/ML SOLN
75.0000 mL | Freq: Once | INTRAVENOUS | Status: AC | PRN
  Administered 2020-09-20: 75 mL via INTRAVENOUS

## 2020-09-21 ENCOUNTER — Telehealth: Payer: Self-pay | Admitting: Family Medicine

## 2020-09-21 DIAGNOSIS — N2889 Other specified disorders of kidney and ureter: Secondary | ICD-10-CM

## 2020-09-21 NOTE — Telephone Encounter (Signed)
Please notify patient that it was recommended that she get an MRI repeat imaging to follow-up on abnormal lesion on her kidney as seen on the CT in hospital and they recommend follow-up on this.  I will work on placing an MRI order for her in the near future to get, will need to be approved and scheduled, and then she can follow-up with me.  Thanks.  Nobie Putnam, [facility] 09/21/2020, 9:03 AM

## 2020-09-21 NOTE — Addendum Note (Signed)
Addended by: Olin Hauser on: 09/21/2020 12:54 PM   Modules accepted: Orders

## 2020-09-21 NOTE — Telephone Encounter (Signed)
Ordered MRI Abd w and wo contrast, renal protocol.  CT abnormal result 1.3 cm hypodense left lower pole renal lesion is indeterminate but suspicious for malignancy. Recommend renal protocol MRI of the abdomen with and without contrast for characterization.  Nobie Putnam, [facility] 09/21/2020, 12:54 PM

## 2020-09-24 ENCOUNTER — Encounter: Payer: Self-pay | Admitting: *Deleted

## 2020-09-24 ENCOUNTER — Ambulatory Visit
Admission: RE | Admit: 2020-09-24 | Discharge: 2020-09-24 | Disposition: A | Discharge status: Home / Self Care | Payer: PPO | Source: Ambulatory Visit | Attending: Family Medicine | Admitting: Family Medicine

## 2020-09-24 ENCOUNTER — Other Ambulatory Visit: Payer: Self-pay

## 2020-09-24 DIAGNOSIS — N2889 Other specified disorders of kidney and ureter: Secondary | ICD-10-CM | POA: Diagnosis not present

## 2020-09-24 DIAGNOSIS — Z9049 Acquired absence of other specified parts of digestive tract: Secondary | ICD-10-CM | POA: Diagnosis not present

## 2020-09-24 MED ORDER — GADOBUTROL 1 MMOL/ML IV SOLN
5.0000 mL | Freq: Once | INTRAVENOUS | Status: AC | PRN
  Administered 2020-09-24: 5 mL via INTRAVENOUS

## 2020-09-27 ENCOUNTER — Other Ambulatory Visit: Payer: Self-pay

## 2020-09-27 ENCOUNTER — Ambulatory Visit: Payer: PPO | Admitting: Registered Nurse

## 2020-09-27 ENCOUNTER — Encounter: Payer: Self-pay | Admitting: *Deleted

## 2020-09-27 ENCOUNTER — Ambulatory Visit
Admission: RE | Admit: 2020-09-27 | Discharge: 2020-09-27 | Disposition: A | Discharge status: Home / Self Care | Payer: PPO | Attending: Gastroenterology | Admitting: Gastroenterology

## 2020-09-27 ENCOUNTER — Encounter
Admission: RE | Disposition: A | Discharge status: Home / Self Care | Payer: Self-pay | Source: Home / Self Care | Attending: Gastroenterology

## 2020-09-27 DIAGNOSIS — R634 Abnormal weight loss: Secondary | ICD-10-CM | POA: Diagnosis not present

## 2020-09-27 DIAGNOSIS — I10 Essential (primary) hypertension: Secondary | ICD-10-CM | POA: Diagnosis not present

## 2020-09-27 DIAGNOSIS — K648 Other hemorrhoids: Secondary | ICD-10-CM | POA: Insufficient documentation

## 2020-09-27 DIAGNOSIS — K449 Diaphragmatic hernia without obstruction or gangrene: Secondary | ICD-10-CM | POA: Diagnosis not present

## 2020-09-27 DIAGNOSIS — K317 Polyp of stomach and duodenum: Secondary | ICD-10-CM | POA: Diagnosis not present

## 2020-09-27 DIAGNOSIS — Z79899 Other long term (current) drug therapy: Secondary | ICD-10-CM | POA: Diagnosis not present

## 2020-09-27 DIAGNOSIS — K219 Gastro-esophageal reflux disease without esophagitis: Secondary | ICD-10-CM | POA: Insufficient documentation

## 2020-09-27 DIAGNOSIS — R197 Diarrhea, unspecified: Secondary | ICD-10-CM | POA: Insufficient documentation

## 2020-09-27 DIAGNOSIS — K5730 Diverticulosis of large intestine without perforation or abscess without bleeding: Secondary | ICD-10-CM | POA: Insufficient documentation

## 2020-09-27 DIAGNOSIS — Z791 Long term (current) use of non-steroidal anti-inflammatories (NSAID): Secondary | ICD-10-CM | POA: Insufficient documentation

## 2020-09-27 DIAGNOSIS — K649 Unspecified hemorrhoids: Secondary | ICD-10-CM | POA: Diagnosis not present

## 2020-09-27 DIAGNOSIS — Z885 Allergy status to narcotic agent status: Secondary | ICD-10-CM | POA: Diagnosis not present

## 2020-09-27 DIAGNOSIS — K5710 Diverticulosis of small intestine without perforation or abscess without bleeding: Secondary | ICD-10-CM | POA: Diagnosis not present

## 2020-09-27 DIAGNOSIS — K2289 Other specified disease of esophagus: Secondary | ICD-10-CM | POA: Insufficient documentation

## 2020-09-27 DIAGNOSIS — Z6820 Body mass index (BMI) 20.0-20.9, adult: Secondary | ICD-10-CM | POA: Insufficient documentation

## 2020-09-27 DIAGNOSIS — F32A Depression, unspecified: Secondary | ICD-10-CM | POA: Diagnosis not present

## 2020-09-27 HISTORY — DX: Zoster without complications: B02.9

## 2020-09-27 HISTORY — DX: Personal history of other infectious and parasitic diseases: Z86.19

## 2020-09-27 HISTORY — PX: ESOPHAGOGASTRODUODENOSCOPY: SHX5428

## 2020-09-27 HISTORY — PX: COLONOSCOPY: SHX5424

## 2020-09-27 HISTORY — DX: Hyperlipidemia, unspecified: E78.5

## 2020-09-27 SURGERY — COLONOSCOPY
Anesthesia: General

## 2020-09-27 MED ORDER — DEXMEDETOMIDINE (PRECEDEX) IN NS 20 MCG/5ML (4 MCG/ML) IV SYRINGE
PREFILLED_SYRINGE | INTRAVENOUS | Status: DC | PRN
  Administered 2020-09-27: 12 ug via INTRAVENOUS

## 2020-09-27 MED ORDER — PROPOFOL 10 MG/ML IV BOLUS
INTRAVENOUS | Status: DC | PRN
  Administered 2020-09-27: 10 mg via INTRAVENOUS
  Administered 2020-09-27: 70 mg via INTRAVENOUS
  Administered 2020-09-27: 20 mg via INTRAVENOUS
  Administered 2020-09-27 (×3): 10 mg via INTRAVENOUS

## 2020-09-27 MED ORDER — PROPOFOL 500 MG/50ML IV EMUL
INTRAVENOUS | Status: AC
  Filled 2020-09-27: qty 50

## 2020-09-27 MED ORDER — LIDOCAINE HCL (CARDIAC) PF 100 MG/5ML IV SOSY
PREFILLED_SYRINGE | INTRAVENOUS | Status: DC | PRN
  Administered 2020-09-27: 40 mg via INTRAVENOUS

## 2020-09-27 MED ORDER — SODIUM CHLORIDE 0.9 % IV SOLN: INTRAVENOUS | Status: DC

## 2020-09-27 MED ORDER — PROPOFOL 500 MG/50ML IV EMUL
INTRAVENOUS | Status: DC | PRN
  Administered 2020-09-27: 150 ug/kg/min via INTRAVENOUS

## 2020-09-27 NOTE — Anesthesia Procedure Notes (Signed)
Date/Time: 09/27/2020 10:28 AM Performed by: Doreen Salvage, CRNA Pre-anesthesia Checklist: Patient identified, Emergency Drugs available, Suction available and Patient being monitored Patient Re-evaluated:Patient Re-evaluated prior to induction Oxygen Delivery Method: Nasal cannula Induction Type: IV induction Dental Injury: Teeth and Oropharynx as per pre-operative assessment  Comments: Nasal cannula with etCO2 monitoring

## 2020-09-27 NOTE — Progress Notes (Signed)
09/28/20 4:46 PM   Amber Brooks Oct 22, 1945 [id number]  Referring provider:  Olin Hauser, DO [address] [place],  [place] [address] Chief Complaint  Patient presents with   New Patient (Initial Visit)     HPI: Amber Brooks is a 75 y.o.female who presents today for further evaluation of renal mass.   She underwent a CT of abdomen and pelvis with contrast on 09/20/2020 that revealed 1.3 cm hypodense left lower pole renal lesion is indeterminate but suspicious for malignancy. Recommend renal protocol MRI of the abdomen with and without contrast for characterization.   09/27/2020 MRI of abdomen showed no evidence of renal mass, hydronephrosis, or other significant abnormality.   She is doing well today with no new urinary symptoms.    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   Brain aneurysm    Complication of anesthesia    Depression    GERD (gastroesophageal reflux disease)    Headache(784.0)    migraines   History of chicken pox    Hyperlipidemia    Hypertension    Mitral valve prolapse    PONV (postoperative nausea and vomiting)    Shingles     Surgical History: Past Surgical History:  Procedure Laterality Date   ABDOMINAL HYSTERECTOMY  1987   ANEURYSM COILING     CHOLECYSTECTOMY  2018   COLONOSCOPY N/A 09/27/2020   Procedure: COLONOSCOPY;  Surgeon: Annamaria Helling, DO;  Location: [facility] ENDOSCOPY;  Service: Gastroenterology;  Laterality: N/A;   CRANIOTOMY Left 11/15/2012   Procedure: CRANIOTOMY INTRACRANIAL ANEURYSM FOR CAROTID;  Surgeon: Winfield Cunas, MD;  Location: [place] NEURO ORS;  Service: Neurosurgery;  Laterality: Left;  LEFT Craniotomy for Aneurysm   ECTOPIC PREGNANCY SURGERY     ESOPHAGOGASTRODUODENOSCOPY N/A 09/27/2020   Procedure: ESOPHAGOGASTRODUODENOSCOPY (EGD);  Surgeon: Annamaria Helling, DO;  Location: [facility] ENDOSCOPY;  Service: Gastroenterology;  Laterality: N/A;   KNEE ARTHROSCOPY      LUMBAR LAMINECTOMY/DECOMPRESSION MICRODISCECTOMY N/A 04/27/2014   Procedure: LUMBAR LAMINECTOMY/DECOMPRESSION MICRODISCECTOMY LUMBAR FOUR-FIVE ;  Surgeon: Kristeen Miss, MD;  Location: [place] NEURO ORS;  Service: Neurosurgery;  Laterality: N/A;   RADIOLOGY WITH ANESTHESIA N/A 10/09/2012   Procedure: RADIOLOGY WITH ANESTHESIA;  Surgeon: Rob Hickman, MD;  Location: [facility];  Service: Radiology;  Laterality: N/A;   RENAL ANGIOGRAPHY N/A 03/09/2020   Procedure: RENAL ANGIOGRAPHY;  Surgeon: Katha Cabal, MD;  Location: [place] CV LAB;  Service: Cardiovascular;  Laterality: N/A;   TONSILLECTOMY     VASCULAR SURGERY     stent placed    Home Medications:  Allergies as of 09/28/2020       Reactions   Latex Anaphylaxis   Hydromorphone Itching        Medication List        Accurate as of September 28, 2020  4:46 PM. If you have any questions, ask your nurse or doctor.          STOP taking these medications    ALPRAZolam 0.5 MG tablet Commonly known as: Duanne Moron Stopped by: Hollice Espy, MD   nadolol 20 MG tablet Commonly known as: CORGARD Stopped by: Hollice Espy, MD   Voltaren 1 % Gel Generic drug: diclofenac Sodium Stopped by: Hollice Espy, MD       TAKE these medications    amLODipine 10 MG tablet Commonly known as: NORVASC Take 1 tablet (10 mg total) by mouth daily.   carvedilol 25 MG tablet  Commonly known as: COREG Take 1 tablet (25 mg total) by mouth 2 (two) times daily.   cholecalciferol 25 MCG (1000 UNIT) tablet Commonly known as: VITAMIN D3 Take 1,000 Units by mouth daily.   esomeprazole 40 MG capsule Commonly known as: NEXIUM Take 40 mg by mouth daily before breakfast.   fluticasone 50 MCG/ACT nasal spray Commonly known as: FLONASE Place into both nostrils daily.   ibuprofen 800 MG tablet Commonly known as: ADVIL Take 1 tablet (800 mg total) by mouth every 8 (eight) hours as needed for headache.   magnesium oxide 400 MG  tablet Commonly known as: MAG-OX Take 200 mg by mouth daily.   multivitamin with minerals Tabs tablet Take 1 tablet by mouth daily.   NON FORMULARY Take 1 capsule by mouth daily. Tru Niagen   NON FORMULARY Take 1 capsule by mouth daily. Cordyceps   Omega 3 1000 MG Caps Take 1,000 mg by mouth daily.   ondansetron 4 MG disintegrating tablet Commonly known as: Zofran ODT Take 1 tablet (4 mg total) by mouth every 8 (eight) hours as needed for nausea or vomiting.   OVER THE COUNTER MEDICATION Take 1 tablet by mouth daily. Appetite support   PROBIOTIC-10 PO Take 1 capsule by mouth daily.   rizatriptan 10 MG tablet Commonly known as: Maxalt Take 1 tablet (10 mg total) by mouth as needed for migraine. May repeat in 2 hours if needed   rosuvastatin 10 MG tablet Commonly known as: CRESTOR Take 1 tablet (10 mg total) by mouth at bedtime.   traZODone 50 MG tablet Commonly known as: DESYREL TAKE 1 TABLET BY MOUTH AT BEDTIME   venlafaxine XR 150 MG 24 hr capsule Commonly known as: EFFEXOR-XR Take 1 capsule (150 mg total) by mouth daily with breakfast.   vitamin B-12 1000 MCG tablet Commonly known as: CYANOCOBALAMIN Take 1,000 mcg by mouth daily.   vitamin C 500 MG tablet Commonly known as: ASCORBIC ACID Take 500 mg by mouth daily.        Allergies:  Allergies  Allergen Reactions   Latex Anaphylaxis   Hydromorphone Itching    Family History: Family History  Problem Relation Age of Onset   Dementia Father    Dementia Brother     Social History:  reports that she has never smoked. She has never used smokeless tobacco. She reports current alcohol use of about 1.0 standard drink per week. She reports that she does not use drugs.   Physical Exam: BP 136/78   Pulse 73   Ht 5\' 2"  (1.575 m)   Wt 110 lb (49.9 kg)   BMI 20.12 kg/m   Constitutional:  Alert and oriented, No acute distress.  Accompanied by her husband today. HEENT: [place] AT, moist mucus membranes.   Trachea midline, no masses. Cardiovascular: No clubbing, cyanosis, or edema. Respiratory: Normal respiratory effort, no increased work of breathing. Skin: No rashes, bruises or suspicious lesions. Neurologic: Grossly intact, no focal deficits, moving all 4 extremities. Psychiatric: Normal mood and affect.  Laboratory Data:  Lab Results  Component Value Date   CREATININE 0.80 03/09/2020     Lab Results  Component Value Date   HGBA1C 5.1 02/11/2020    Urinalysis Results for orders placed or performed in visit on 09/28/20  Microscopic Examination   Urine  Result Value Ref Range   WBC, UA 0-5 0 - 5 /hpf   RBC None seen 0 - 2 /hpf   Epithelial Cells (non renal) 0-10 0 - 10 /  hpf   Casts Present (A) None seen /lpf   Cast Type Hyaline casts N/A   Bacteria, UA None seen None seen/Few  Urinalysis, Complete  Result Value Ref Range   Specific Gravity, UA 1.020 1.005 - 1.030   pH, UA 5.5 5.0 - 7.5   Color, UA Yellow Yellow   Appearance Ur Clear Clear   Leukocytes,UA Negative Negative   Protein,UA Negative Negative/Trace   Glucose, UA Negative Negative   Ketones, UA Trace (A) Negative   RBC, UA Negative Negative   Bilirubin, UA Negative Negative   Urobilinogen, Ur 0.2 0.2 - 1.0 mg/dL   Nitrite, UA Negative Negative   Microscopic Examination See below:      Pertinent Imaging:   EXAM: CT ABDOMEN AND PELVIS WITH CONTRAST    IMPRESSION: 1. Mild to moderate stool burden in the cecum. Otherwise, no acute finding in the abdomen or pelvis. 2. 1.3 cm hypodense left lower pole renal lesion is indeterminate but suspicious for malignancy. Recommend renal protocol MRI of the abdomen with and without contrast for characterization.   Aortic Atherosclerosis (ICD10-I70.0).     Electronically Signed   By: Valetta Mole M.D.   On: 09/20/2020 14:41   CLINICAL DATA:  Indeterminate left renal lesion on recent CT.     IMPRESSION: No evidence of renal mass, hydronephrosis, or other  significant abnormality.     Electronically Signed   By: Marlaine Hind M.D.   On: 09/27/2020 08:59  Above CT scan with contrast as well as MRI were personally reviewed.  Disagree somewhat with the interpretation of MRI.  There is a small area which corresponds to the area of on CT on the left lower pole but this is somewhat distorted by artifact and thus difficult to distinguish.  It is possible that there is underlying lesion here.  Assessment & Plan:    Left lower pole lesion  - Concerning on CT less apparent but possibly still present on MR I, nondiagnostic with some distortion artifact  Presence of a small renal mass raises the suspicion of primary renal malignancy.  We discussed this in detail and in regards to the spectrum of renal masses which includes cysts (pure cysts are considered benign), solid masses and everything in between. The risk of metastasis increases as the size of solid renal mass increases. In general, it is believed that the risk of metastasis for renal masses less than 3-4 cm is small (up to approximately 5%) based mainly on large retrospective studies.  Additionally, malignancies tend to be relatively slow-growing for the most part.  Given somewhat inconclusive imaging, would recommend considering another CT scan in the year to assess for resolution and or change of this area.  We discussed that if in fact this is a renal mass (although now less is suspected), how the lesion changes over time would also be helpful.  Differential diagnosis was also discussed including possibility that the lesion is scarring, artifactual, or some other none malignant etiology.    I recommended repeat imaging in a year in the form of CT abdomen with and without contrast.  She and her husband are comfortable with this plan and agree.  Return in about 1 year (around 09/28/2021). With CT abd with and without contrast  I,Kailey Littlejohn,acting as a scribe for Hollice Espy, MD.,have  documented all relevant documentation on the behalf of Hollice Espy, MD,as directed by  Hollice Espy, MD while in the presence of Hollice Espy, MD.   [place]  [address], [place] [employer] [place], [place] [address] [phone]  I spent 45 total minutes on the day of the encounter including pre-visit review of the medical record, face-to-face time with the patient, and post visit ordering of labs/imaging/tests.

## 2020-09-27 NOTE — Op Note (Signed)
[facility] Gastroenterology Patient Name: Amber Brooks Procedure Date: 09/27/2020 10:08 AM MRN: [id number] Account #: [id number] Date of Birth: January 26, 1945 Admit Type: Outpatient Age: 75 Room: [facility] ENDO ROOM 2 Gender: Female Note Status: Finalized Instrument Name: Colonoscope [id number] Procedure:             Colonoscopy Indications:           Clinically significant diarrhea of unexplained origin Providers:             Rueben Bash, DO Referring MD:          Olin Hauser (Referring MD) Medicines:             Monitored Anesthesia Care Complications:         No immediate complications. Estimated blood loss:                         Minimal. Procedure:             Pre-Anesthesia Assessment:                        - Prior to the procedure, a History and Physical was                         performed, and patient medications and allergies were                         reviewed. The patient is competent. The risks and                         benefits of the procedure and the sedation options and                         risks were discussed with the patient. All questions                         were answered and informed consent was obtained.                         Patient identification and proposed procedure were                         verified by the physician, the nurse, the anesthetist                         and the technician in the endoscopy suite. Mental                         Status Examination: alert and oriented. Airway                         Examination: normal oropharyngeal airway and neck                         mobility. Respiratory Examination: clear to                         auscultation. CV Examination: RRR, no murmurs, no S3  or S4. Prophylactic Antibiotics: The patient does not                         require prophylactic antibiotics. Prior                         Anticoagulants: The patient has taken  no previous                         anticoagulant or antiplatelet agents. ASA Grade                         Assessment: III - A patient with severe systemic                         disease. After reviewing the risks and benefits, the                         patient was deemed in satisfactory condition to                         undergo the procedure. The anesthesia plan was to use                         monitored anesthesia care (MAC). Immediately prior to                         administration of medications, the patient was                         re-assessed for adequacy to receive sedatives. The                         heart rate, respiratory rate, oxygen saturations,                         blood pressure, adequacy of pulmonary ventilation, and                         response to care were monitored throughout the                         procedure. The physical status of the patient was                         re-assessed after the procedure.                        After obtaining informed consent, the colonoscope was                         passed under direct vision. Throughout the procedure,                         the patient's blood pressure, pulse, and oxygen                         saturations were monitored continuously. The  Colonoscope was introduced through the anus and                         advanced to the the terminal ileum, with                         identification of the appendiceal orifice and IC                         valve. 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 Successful                         completion of the procedure was aided by changing the                         patient to a supine position, straightening and                         shortening the scope to obtain bowel loop reduction,                          applying abdominal pressure and lavage. The patient                         tolerated the procedure well. The quality of the bowel                         preparation was evaluated using the BBPS [facility] Bowel                         Preparation Scale) with scores of: Right Colon = 3,                         Transverse Colon = 3 and Left Colon = 3 (entire mucosa                         seen well with no residual staining, small fragments                         of stool or opaque liquid). The total BBPS score                         equals 9. The terminal ileum, ileocecal valve,                         appendiceal orifice, and rectum were photographed. Findings:      The perianal and digital rectal examinations were normal. Pertinent       negatives include normal sphincter tone.      The terminal ileum appeared normal. Estimated blood loss: none.      The entire examined colon appeared  normal on direct and retroflexion       views.      Normal mucosa was found in the entire colon. Biopsies for histology were       taken with a cold forceps from the right colon and left colon for       evaluation of microscopic colitis. Estimated blood loss was minimal.      Non-bleeding internal hemorrhoids were found during retroflexion. The       hemorrhoids were Grade I (internal hemorrhoids that do not prolapse). Impression:            - The examined portion of the ileum was normal.                        - internal hemorrhoids noted                        - The entire examined colon is normal on direct and                         retroflexion views.                        - Normal mucosa in the entire examined colon. Biopsied. Recommendation:        - Discharge patient to home.                        - Resume previous diet.                        - Continue present medications.                        - Await pathology results.                        - Repeat colonoscopy is  not recommended not indicated                         as patient has aged out of screening.                        - Return to referring physician as previously                         scheduled. Procedure Code(s):     --- Professional ---                        [phone], Colonoscopy, flexible; with biopsy, single or                         multiple Diagnosis Code(s):     --- Professional ---                        R19.7, Diarrhea, unspecified CPT copyright 2019 [employer]. All rights reserved. The codes documented in this report are preliminary and upon coder review may  be revised to meet current compliance requirements. Attending Participation:      I personally performed the entire procedure. Volney American, DO Annamaria Helling DO, DO 09/27/2020 11:30:09 AM This report has been  signed electronically. Number of Addenda: 0 Note Initiated On: 09/27/2020 10:08 AM Scope Withdrawal Time: 0 hours 10 minutes 42 seconds  Total Procedure Duration: 0 hours 27 minutes 34 seconds  Estimated Blood Loss:  Estimated blood loss was minimal.      [facility]

## 2020-09-27 NOTE — Op Note (Signed)
[facility] Gastroenterology Patient Name: Amber Brooks Procedure Date: 09/27/2020 10:09 AM MRN: [id number] Account #: [id number] Date of Birth: [birth year] Admit Type: Outpatient Age: 75 Room: [facility] ENDO ROOM 2 Gender: Female Note Status: Finalized Instrument Name: Michaelle Birks [id number] Procedure:             Upper GI endoscopy Indications:           Gastro-esophageal reflux disease, Diarrhea Providers:             Rueben Bash, DO Referring MD:          Olin Hauser (Referring MD) Medicines:             Monitored Anesthesia Care Complications:         No immediate complications. Estimated blood loss:                         Minimal. Procedure:             Pre-Anesthesia Assessment:                        - Prior to the procedure, a History and Physical was                         performed, and patient medications and allergies were                         reviewed. The patient is competent. The risks and                         benefits of the procedure and the sedation options and                         risks were discussed with the patient. All questions                         were answered and informed consent was obtained.                         Patient identification and proposed procedure were                         verified by the physician, the nurse, the anesthetist                         and the technician in the endoscopy suite. Mental                         Status Examination: normal. Airway Examination: normal                         oropharyngeal airway and neck mobility. Respiratory                         Examination: clear to auscultation. CV Examination:                         normal. Prophylactic Antibiotics: The patient does not  require prophylactic antibiotics. Prior                         Anticoagulants: The patient has taken no previous                         anticoagulant or  antiplatelet agents. ASA Grade                         Assessment: III - A patient with severe systemic                         disease. After reviewing the risks and benefits, the                         patient was deemed in satisfactory condition to                         undergo the procedure. The anesthesia plan was to use                         monitored anesthesia care (MAC). Immediately prior to                         administration of medications, the patient was                         re-assessed for adequacy to receive sedatives. The                         heart rate, respiratory rate, oxygen saturations,                         blood pressure, adequacy of pulmonary ventilation, and                         response to care were monitored throughout the                         procedure. The physical status of the patient was                         re-assessed after the procedure.                        After obtaining informed consent, the endoscope was                         passed under direct vision. Throughout the procedure,                         the patient's blood pressure, pulse, and oxygen                         saturations were monitored continuously. The Endoscope                         was introduced through the mouth, and advanced to the  second part of duodenum. The upper GI endoscopy was                         accomplished without difficulty. The patient tolerated                         the procedure well. Findings:      The duodenal bulb, first portion of the duodenum and second portion of       the duodenum were normal. Biopsies for histology were taken with a cold       forceps for evaluation of celiac disease. Estimated blood loss was       minimal.      Normal mucosa was found in the entire examined stomach. Biopsies were       taken with a cold forceps for Helicobacter pylori testing. Estimated       blood loss was  minimal.      A small hiatal hernia was present. Estimated blood loss: none.      The Z-line was regular and was found 38 cm from the incisors.      Esophagogastric landmarks were identified: the Z-line was found at 38 cm       from the incisors.      Normal mucosa was found in the entire esophagus. Biopsies were taken       with a cold forceps for histology. Estimated blood loss was minimal.      The exam was otherwise without abnormality.      Abnormal motility was noted in the esophagus. The cricopharyngeus was       normal. The distal esophagus/lower esophageal sphincter is open.       Tertiary peristaltic waves are noted. The scope was withdrawn. Dilation       was performed with a Maloney dilator with no resistance at 52 Fr. The       dilation site was examined and showed no change. Estimated blood loss:       none.      Multiple less than 5 mm sessile polyps with no bleeding and no stigmata       of recent bleeding were found in the gastric body. fundic gland       appearing. Estimated blood loss: none. Impression:            - Normal duodenal bulb, first portion of the duodenum                         and second portion of the duodenum. Biopsied.                        - Normal mucosa was found in the entire stomach.                         Biopsied. Fundic gland polyps noted- not biopsied.                        - Small hiatal hernia.                        - Z-line regular, 38 cm from the incisors.                        -  Esophagogastric landmarks identified.                        - Normal mucosa was found in the entire esophagus.                         Biopsied.                        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                        - The examination was otherwise normal. Recommendation:        - Discharge patient to home.                        - Resume previous diet.                         - Continue present medications.                        - Await pathology results.                        - Return to referring physician as previously                         scheduled. Procedure Code(s):     --- Professional ---                        [id number], Esophagogastroduodenoscopy, flexible,                         transoral; with biopsy, single or multiple                        43450, Dilation of esophagus, by unguided sound or                         bougie, single or multiple passes Diagnosis Code(s):     --- Professional ---                        K44.9, Diaphragmatic hernia without obstruction or                         gangrene                        K21.9, Gastro-esophageal reflux disease without                         esophagitis                        R19.7, Diarrhea, unspecified CPT copyright 2019 American Medical Association. All rights reserved. The codes documented in this report are preliminary and upon coder review may  be revised to meet current compliance requirements. Attending Participation:  I personally performed the entire procedure. Volney American, DO Annamaria Helling DO, DO 09/27/2020 11:24:52 AM This report has been signed electronically. Number of Addenda: 0 Note Initiated On: 09/27/2020 10:09 AM Estimated Blood Loss:  Estimated blood loss was minimal.      [facility]

## 2020-09-27 NOTE — Interval H&P Note (Signed)
History and Physical Interval Note: Preprocedure H&P from 09/27/20  was reviewed and there was no interval change after seeing and examining the patient.  Written consent was obtained from the patient after discussion of risks, benefits, and alternatives. Patient has consented to proceed with Esophagogastroduodenoscopy and Colonoscopy with possible intervention   09/27/2020 10:20 AM  Amber Brooks  has presented today for surgery, with the diagnosis of Diarrhea, unspecified type (R19.7) Nausea (R11.0) Weight loss (R63.4).  The various methods of treatment have been discussed with the patient and family. After consideration of risks, benefits and other options for treatment, the patient has consented to  Procedure(s): COLONOSCOPY (N/A) ESOPHAGOGASTRODUODENOSCOPY (EGD) (N/A) as a surgical intervention.  The patient's history has been reviewed, patient examined, no change in status, stable for surgery.  I have reviewed the patient's chart and labs.  Questions were answered to the patient's satisfaction.     Annamaria Helling

## 2020-09-27 NOTE — Transfer of Care (Signed)
Immediate Anesthesia Transfer of Care Note  Patient: Amber Brooks  Procedure(s) Performed: COLONOSCOPY ESOPHAGOGASTRODUODENOSCOPY (EGD)  Patient Location: PACU and Endoscopy Unit  Anesthesia Type:General  Level of Consciousness: sedated  Airway & Oxygen Therapy: Patient Spontanous Breathing  Post-op Assessment: Report given to RN and Post -op Vital signs reviewed and stable  Post vital signs: Reviewed and stable  Last Vitals:  Vitals Value Taken Time  BP 120/54 09/27/20 1118  Temp    Pulse 76 09/27/20 1119  Resp 14 09/27/20 1119  SpO2 99 % 09/27/20 1119  Vitals shown include unvalidated device data.  Last Pain:  Vitals:   09/27/20 0947  TempSrc: Temporal  PainSc: 0-No pain         Complications: No notable events documented.

## 2020-09-27 NOTE — Anesthesia Preprocedure Evaluation (Signed)
Anesthesia Evaluation  Patient identified by MRN, date of birth, ID band Patient awake    Reviewed: Allergy & Precautions, H&P , NPO status , Patient's Chart, lab work & pertinent test results, reviewed documented beta blocker date and time   History of Anesthesia Complications (+) PONV and history of anesthetic complications  Airway Mallampati: II   Neck ROM: full    Dental  (+) Poor Dentition   Pulmonary neg pulmonary ROS,    Pulmonary exam normal        Cardiovascular Exercise Tolerance: Good hypertension, On Medications negative cardio ROS Normal cardiovascular exam Rhythm:regular Rate:Normal     Neuro/Psych  Headaches, PSYCHIATRIC DISORDERS Anxiety Depression  Neuromuscular disease    GI/Hepatic Neg liver ROS, GERD  Medicated,  Endo/Other  negative endocrine ROS  Renal/GU negative Renal ROS  negative genitourinary   Musculoskeletal   Abdominal   Peds  Hematology negative hematology ROS (+)   Anesthesia Other Findings Past Medical History: No date: Anxiety 12/03/2013: Ataxia No date: Brain aneurysm No date: Complication of anesthesia No date: Depression No date: GERD (gastroesophageal reflux disease) No date: Headache(784.0)     Comment:  migraines No date: History of chicken pox No date: Hyperlipidemia No date: Hypertension No date: Mitral valve prolapse No date: PONV (postoperative nausea and vomiting) No date: Shingles Past Surgical History: 1987: ABDOMINAL HYSTERECTOMY No date: ANEURYSM COILING 2018: CHOLECYSTECTOMY 11/15/2012: CRANIOTOMY; Left     Comment:  Procedure: CRANIOTOMY INTRACRANIAL ANEURYSM FOR CAROTID;              Surgeon: Winfield Cunas, MD;  Location: [place] [facility];                Service: Neurosurgery;  Laterality: Left;  LEFT               Craniotomy for Aneurysm No date: ECTOPIC PREGNANCY SURGERY No date: KNEE ARTHROSCOPY 04/27/2014: LUMBAR LAMINECTOMY/DECOMPRESSION  MICRODISCECTOMY; N/A     Comment:  Procedure: LUMBAR LAMINECTOMY/DECOMPRESSION               MICRODISCECTOMY LUMBAR FOUR-FIVE ;  Surgeon: Kristeen Miss, MD;  Location: [place] [facility] ORS;  Service:               Neurosurgery;  Laterality: N/A; 10/09/2012: RADIOLOGY WITH ANESTHESIA; N/A     Comment:  Procedure: RADIOLOGY WITH ANESTHESIA;  Surgeon: Rob Hickman, MD;  Location: [employer];  Service: Radiology;                Laterality: N/A; 03/09/2020: RENAL ANGIOGRAPHY; N/A     Comment:  Procedure: RENAL ANGIOGRAPHY;  Surgeon: Katha Cabal, MD;  Location: [place] CV LAB;  Service:               Cardiovascular;  Laterality: N/A; No date: TONSILLECTOMY No date: VASCULAR SURGERY     Comment:  stent placed   Reproductive/Obstetrics negative OB ROS                             Anesthesia Physical Anesthesia Plan  ASA: 3  Anesthesia Plan: General   Post-op Pain Management:    Induction:   PONV Risk Score and Plan:   Airway Management Planned:  Additional Equipment:   Intra-op Plan:   Post-operative Plan:   Informed Consent: I have reviewed the patients History and Physical, chart, labs and discussed the procedure including the risks, benefits and alternatives for the proposed anesthesia with the patient or authorized representative who has indicated his/her understanding and acceptance.     Dental Advisory Given  Plan Discussed with: CRNA  Anesthesia Plan Comments:         Anesthesia Quick Evaluation

## 2020-09-27 NOTE — H&P (Signed)
[facility] Pre-Procedure H&P   Patient ID: Amber Brooks is a 75 y.o. female.  Gastroenterology Provider: Annamaria Helling, DO  Referring Provider: Laurine Blazer, PA PCP: Olin Hauser, DO  Date: 09/27/2020  HPI [name] is a 75 y.o. female who presents today for Esophagogastroduodenoscopy and Colonoscopy for dysphagia, diarrhea, weight loss.  Still with issues swallowing pills and solids. Required dilation in the past which ash helped. Reflux is overall controlled with nexium 40 mg qd, but has breakthrough sx which are controlled by an additional dose. Notes nausea without vomiting.  Has had an approximately 8 lbs weight loss. Poor appetite.   Continues to have diarrhea. Colonoscopy in 2020 with a small polyp noted. Redundant colon per notation. Infectious w/u negative; TSH wnl.  No other acute gi complaints.  Past Medical History:  Diagnosis Date   Anxiety    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   Brain aneurysm    Complication of anesthesia    Depression    GERD (gastroesophageal reflux disease)    Headache(784.0)    migraines   History of chicken pox    Hyperlipidemia    Hypertension    Mitral valve prolapse    PONV (postoperative nausea and vomiting)    Shingles     Past Surgical History:  Procedure Laterality Date   ABDOMINAL HYSTERECTOMY  1987   ANEURYSM COILING     CHOLECYSTECTOMY  2018   CRANIOTOMY Left 11/15/2012   Procedure: CRANIOTOMY INTRACRANIAL ANEURYSM FOR CAROTID;  Surgeon: Winfield Cunas, MD;  Location: [facility];  Service: Neurosurgery;  Laterality: Left;  LEFT Craniotomy for Aneurysm   ECTOPIC PREGNANCY SURGERY     KNEE ARTHROSCOPY     LUMBAR LAMINECTOMY/DECOMPRESSION MICRODISCECTOMY N/A 04/27/2014   Procedure: LUMBAR LAMINECTOMY/DECOMPRESSION MICRODISCECTOMY LUMBAR FOUR-FIVE ;  Surgeon: Kristeen Miss, MD;  Location: [place] [facility]UR[facility] ORS;  Service: Neurosurgery;  Laterality: N/A;   RADIOLOGY WITH ANESTHESIA N/A 10/09/2012    Procedure: RADIOLOGY WITH ANESTHESIA;  Surgeon: Rob Hickman, MD;  Location: [place];  Service: Radiology;  Laterality: N/A;   RENAL ANGIOGRAPHY N/A 03/09/2020   Procedure: RENAL ANGIOGRAPHY;  Surgeon: Katha Cabal, MD;  Location: [place] CV LAB;  Service: Cardiovascular;  Laterality: N/A;   TONSILLECTOMY     VASCULAR SURGERY     stent placed    Family History No h/o GI disease or malignancy  Review of Systems  Constitutional:  Positive for appetite change (diminished) and unexpected weight change (8 lb weight loss). Negative for activity change, fatigue and fever.  HENT:  Positive for trouble swallowing. Negative for voice change.   Respiratory:  Negative for shortness of breath and wheezing.   Cardiovascular:  Negative for chest pain and palpitations.  Gastrointestinal:  Positive for abdominal pain and diarrhea. Negative for abdominal distention, anal bleeding, blood in stool, constipation, nausea, rectal pain and vomiting.  Musculoskeletal:  Negative for arthralgias and myalgias.  Skin:  Negative for color change and pallor.  Neurological:  Negative for dizziness, syncope and weakness.  Psychiatric/Behavioral:  Negative for confusion.   All other systems reviewed and are negative.   Medications No current facility-administered medications on file prior to encounter.   Current Outpatient Medications on File Prior to Encounter  Medication Sig Dispense Refill   ALPRAZolam (XANAX) 0.5 MG tablet Take 0.5 mg by mouth at bedtime as needed for anxiety.     amLODipine (NORVASC) 10 MG tablet Take 1 tablet (10 mg total) by mouth daily. 90 tablet 3  cholecalciferol (VITAMIN D3) 25 MCG (1000 UNIT) tablet Take 1,000 Units by mouth daily.     diclofenac Sodium (VOLTAREN) 1 % GEL Apply 2 g topically 4 (four) times daily.     esomeprazole (NEXIUM) 40 MG capsule Take 40 mg by mouth daily before breakfast.     fluticasone (FLONASE) 50 MCG/ACT nasal spray Place into both nostrils  daily.     ibuprofen (ADVIL) 800 MG tablet Take 1 tablet (800 mg total) by mouth every 8 (eight) hours as needed for headache. 90 tablet 2   magnesium oxide (MAG-OX) 400 MG tablet Take 200 mg by mouth daily.     Multiple Vitamin (MULTIVITAMIN WITH MINERALS) TABS tablet Take 1 tablet by mouth daily.     NON FORMULARY Take 1 capsule by mouth daily. Tru Niagen     NON FORMULARY Take 1 capsule by mouth daily. Cordyceps     Omega 3 1000 MG CAPS Take 1,000 mg by mouth daily.     ondansetron (ZOFRAN ODT) 4 MG disintegrating tablet Take 1 tablet (4 mg total) by mouth every 8 (eight) hours as needed for nausea or vomiting. 30 tablet 0   OVER THE COUNTER MEDICATION Take 1 tablet by mouth daily. Appetite support     Probiotic Product (PROBIOTIC-10 PO) Take 1 capsule by mouth daily.     rizatriptan (MAXALT) 10 MG tablet Take 1 tablet (10 mg total) by mouth as needed for migraine. May repeat in 2 hours if needed 10 tablet 2   rosuvastatin (CRESTOR) 10 MG tablet Take 1 tablet (10 mg total) by mouth at bedtime. 90 tablet 3   venlafaxine XR (EFFEXOR-XR) 150 MG 24 hr capsule Take 1 capsule (150 mg total) by mouth daily with breakfast. 90 capsule 3   vitamin B-12 (CYANOCOBALAMIN) 1000 MCG tablet Take 1,000 mcg by mouth daily.     vitamin C (ASCORBIC ACID) 500 MG tablet Take 500 mg by mouth daily.     carvedilol (COREG) 25 MG tablet Take 1 tablet (25 mg total) by mouth 2 (two) times daily. 60 tablet 5   nadolol (CORGARD) 20 MG tablet Take 20 mg by mouth daily. (Patient not taking: Reported on 09/27/2020)     [DISCONTINUED] diphenhydrAMINE (BENADRYL) 25 MG tablet Take 25 mg by mouth daily as needed for allergies.     [DISCONTINUED] ezetimibe-simvastatin (VYTORIN) 10-40 MG per tablet Take 1 tablet by mouth at bedtime.       Pertinent medications related to GI and procedure were reviewed by me with the patient prior to the procedure   Current Facility-Administered Medications:    0.9 %  sodium chloride infusion, ,  Intravenous, Continuous, Annamaria Helling, DO  sodium chloride         Allergies  Allergen Reactions   Latex Anaphylaxis   Codeine Nausea Only   Hydromorphone Itching   Allergies were reviewed by me prior to the procedure  Objective    Vitals:   09/27/20 0947  BP: (!) 143/59  Pulse: 66  Resp: 17  Temp: (!) 97.4 F (36.3 C)  TempSrc: Temporal  SpO2: 99%  Weight: 49.9 kg  Height: 5\' 2"  (1.575 m)     Physical Exam Vitals reviewed.  Constitutional:      General: She is not in acute distress.    Appearance: Normal appearance. She is not ill-appearing, toxic-appearing or diaphoretic.  HENT:     Head: Normocephalic and atraumatic.     Nose: Nose normal.     Mouth/Throat:  Mouth: Mucous membranes are moist.     Pharynx: Oropharynx is clear.  Eyes:     General: No scleral icterus.    Extraocular Movements: Extraocular movements intact.  Cardiovascular:     Rate and Rhythm: Normal rate and regular rhythm.     Heart sounds: Normal heart sounds. No murmur heard.   No friction rub. No gallop.  Pulmonary:     Effort: Pulmonary effort is normal. No respiratory distress.     Breath sounds: Normal breath sounds. No wheezing, rhonchi or rales.  Abdominal:     General: Bowel sounds are normal. There is no distension.     Palpations: Abdomen is soft.     Tenderness: There is no abdominal tenderness. There is no guarding or rebound.  Musculoskeletal:     Cervical back: Neck supple.     Right lower leg: No edema.     Left lower leg: No edema.  Skin:    General: Skin is warm and dry.     Coloration: Skin is not jaundiced or pale.  Neurological:     General: No focal deficit present.     Mental Status: She is alert and oriented to person, place, and time. Mental status is at baseline.  Psychiatric:        Mood and Affect: Mood normal.        Behavior: Behavior normal.        Thought Content: Thought content normal.        Judgment: Judgment normal.      Assessment:  [name] is a 75 y.o. female  who presents today for Esophagogastroduodenoscopy and Colonoscopy for dysphagia, diarrhea, weight loss.  Plan:  Esophagogastroduodenoscopy and Colonoscopy with possible intervention today  Esophagogastroduodenoscopy and colonoscopy with possible biopsy, control of bleeding, polypectomy, and interventions as necessary has been discussed with the patient/patient representative. Informed consent was obtained from the patient/patient representative after explaining the indication, nature, and risks of the procedure including but not limited to death, bleeding, perforation, missed neoplasm/lesions, cardiorespiratory compromise, and reaction to medications. Opportunity for questions was given and appropriate answers were provided. Patient/patient representative has verbalized understanding is amenable to undergoing the procedure.   Annamaria Helling, DO  [facility] Gastroenterology  Portions of the record may have been created with voice recognition software. Occasional wrong-word or 'sound-a-like' substitutions may have occurred due to the inherent limitations of voice recognition software.  Read the chart carefully and recognize, using context, where substitutions may have occurred.

## 2020-09-28 ENCOUNTER — Ambulatory Visit: Payer: PPO | Admitting: Urology

## 2020-09-28 ENCOUNTER — Encounter: Payer: Self-pay | Admitting: Gastroenterology

## 2020-09-28 VITALS — BP 136/78 | HR 73 | Ht 62.0 in | Wt 110.0 lb

## 2020-09-28 DIAGNOSIS — N2889 Other specified disorders of kidney and ureter: Secondary | ICD-10-CM

## 2020-09-28 LAB — SURGICAL PATHOLOGY

## 2020-09-28 NOTE — Anesthesia Postprocedure Evaluation (Signed)
Anesthesia Post Note  Patient: Amber Brooks  Procedure(s) Performed: COLONOSCOPY ESOPHAGOGASTRODUODENOSCOPY (EGD)  Patient location during evaluation: PACU Anesthesia Type: General Level of consciousness: awake and alert Pain management: pain level controlled Vital Signs Assessment: post-procedure vital signs reviewed and stable Respiratory status: spontaneous breathing, nonlabored ventilation, respiratory function stable and patient connected to nasal cannula oxygen Cardiovascular status: blood pressure returned to baseline and stable Postop Assessment: no apparent nausea or vomiting Anesthetic complications: no   No notable events documented.   Last Vitals:  Vitals:   09/27/20 1138 09/27/20 1148  BP: 139/70 (!) 142/73  Pulse: 76 74  Resp: 18 17  Temp:    SpO2: 100% 100%    Last Pain:  Vitals:   09/28/20 0725  TempSrc:   PainSc: 0-No pain                 Molli Barrows

## 2020-09-29 LAB — MICROSCOPIC EXAMINATION
Bacteria, UA: NONE SEEN
RBC, Urine: NONE SEEN /hpf (ref 0–2)

## 2020-09-29 LAB — URINALYSIS, COMPLETE
Bilirubin, UA: NEGATIVE
Glucose, UA: NEGATIVE
Leukocytes,UA: NEGATIVE
Nitrite, UA: NEGATIVE
Protein,UA: NEGATIVE
RBC, UA: NEGATIVE
Specific Gravity, UA: 1.02 (ref 1.005–1.030)
Urobilinogen, Ur: 0.2 mg/dL (ref 0.2–1.0)
pH, UA: 5.5 (ref 5.0–7.5)

## 2020-10-01 ENCOUNTER — Encounter: Payer: Self-pay | Admitting: Family Medicine

## 2020-10-01 ENCOUNTER — Telehealth: Payer: Self-pay | Admitting: Family Medicine

## 2020-10-01 ENCOUNTER — Ambulatory Visit: Payer: PPO

## 2020-10-01 NOTE — Telephone Encounter (Signed)
Patient requesting PCP send her endoscopy results notes through My Chart again. Patient states she was able to view but would like to print and when she click the print button the results notes from PCP disappeared.

## 2020-10-29 ENCOUNTER — Other Ambulatory Visit: Payer: Self-pay | Admitting: Family Medicine

## 2020-10-29 DIAGNOSIS — R519 Headache, unspecified: Secondary | ICD-10-CM

## 2020-10-30 NOTE — Telephone Encounter (Signed)
Requested Prescriptions  Pending Prescriptions Disp Refills  . ibuprofen (ADVIL) 800 MG tablet [Pharmacy Med Name: Ibuprofen 800 MG Oral Tablet] 90 tablet 0    Sig: TAKE 1 TABLET BY MOUTH EVERY 8 HOURS AS NEEDED FOR HEADACHE     Analgesics:  NSAIDS Passed - 10/29/2020  2:32 PM      Passed - Cr in normal range and within 360 days    Creat  Date Value Ref Range Status  02/11/2020 0.72 0.60 - 0.93 mg/dL Final    Comment:    For patients >74 years of age, the reference limit for Creatinine is approximately 13% higher for people identified as African-American. .    Creatinine, Ser  Date Value Ref Range Status  03/09/2020 0.80 0.44 - 1.00 mg/dL Final         Passed - HGB in normal range and within 360 days    Hemoglobin  Date Value Ref Range Status  02/11/2020 13.7 11.7 - 15.5 g/dL Final         Passed - Patient is not pregnant      Passed - Valid encounter within last 12 months    Recent Outpatient Visits          2 months ago Intractable acute post-traumatic headache   [facility], DO   4 months ago [place], DO   5 months ago Acute bacterial sinusitis   [facility] [place], Coralie Keens, NP   6 months ago Acute neck pain   [facility], DO   8 months ago Annual physical exam   [facility], Devonne Doughty, DO      Future Appointments            In 6 days [facility], Devonne Doughty, [facility], [place]   In 3 months [facility], [place]   In 11 months Hollice Espy, [place]

## 2020-11-01 ENCOUNTER — Other Ambulatory Visit: Payer: Self-pay

## 2020-11-01 DIAGNOSIS — I110 Hypertensive heart disease with heart failure: Secondary | ICD-10-CM

## 2020-11-01 DIAGNOSIS — G43109 Migraine with aura, not intractable, without status migrainosus: Secondary | ICD-10-CM

## 2020-11-01 MED ORDER — CARVEDILOL 25 MG PO TABS
25.0000 mg | ORAL_TABLET | Freq: Two times a day (BID) | ORAL | 5 refills | Status: DC
Start: 2020-11-01 — End: 2021-03-21

## 2020-11-05 ENCOUNTER — Ambulatory Visit (INDEPENDENT_AMBULATORY_CARE_PROVIDER_SITE_OTHER): Payer: PPO | Admitting: Family Medicine

## 2020-11-05 ENCOUNTER — Encounter: Payer: Self-pay | Admitting: Family Medicine

## 2020-11-05 ENCOUNTER — Other Ambulatory Visit: Payer: Self-pay

## 2020-11-05 VITALS — BP 126/47 | HR 75 | Ht 62.0 in | Wt 114.8 lb

## 2020-11-05 DIAGNOSIS — G8929 Other chronic pain: Secondary | ICD-10-CM

## 2020-11-05 DIAGNOSIS — Z23 Encounter for immunization: Secondary | ICD-10-CM

## 2020-11-05 DIAGNOSIS — M65312 Trigger thumb, left thumb: Secondary | ICD-10-CM | POA: Diagnosis not present

## 2020-11-05 DIAGNOSIS — M79642 Pain in left hand: Secondary | ICD-10-CM | POA: Diagnosis not present

## 2020-11-05 DIAGNOSIS — F5101 Primary insomnia: Secondary | ICD-10-CM

## 2020-11-05 MED ORDER — ZOLPIDEM TARTRATE ER 6.25 MG PO TBCR
6.2500 mg | EXTENDED_RELEASE_TABLET | Freq: Every evening | ORAL | 2 refills | Status: DC | PRN
Start: 2020-11-05 — End: 2020-11-16

## 2020-11-05 NOTE — Progress Notes (Signed)
Subjective:    Patient ID: Amber Brooks, female    DOB: [birth year], 75 y.o.   MRN: [id number]  Amber Brooks is a 75 y.o. female presenting on 11/05/2020 for Hand Pain   HPI  Left Hand Pain Left Thumb Trigger Possible Carpal Tunnel Reports 1 month worsening. 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 Now having triggering of Left thumb, causes pain and abnormal movement. Not bothering her as much during the day, but worse in PM. She is interested in seeing Dr Roland Rack ([facility]) Taking Ibuprofen 800mg  PRN  Major Depression recurrent in remission She was previously on Mirtazapine, came off then was switched to Trazodone 50mg  nightly back in 12/2019, but limited success, tried x 2 = 100mg  still without relief.   Health Maintenance: Due for Flu Shot, will receive today    Depression screen [facility] 2/9 11/05/2020 06/22/2020 12/18/2019  Decreased Interest 0 0 0  Down, Depressed, Hopeless 0 0 0  PHQ - 2 Score 0 0 0  Altered sleeping 3 0 1  Tired, decreased energy 0 0 0  Change in appetite 0 0 0  Feeling bad or failure about yourself  0 0 0  Trouble concentrating 0 0 0  Moving slowly or fidgety/restless 0 0 0  Suicidal thoughts 0 0 0  PHQ-9 Score 3 0 1  Difficult doing work/chores Not difficult at all Not difficult at all Not difficult at all    Social History   Tobacco Use   Smoking status: Never   Smokeless tobacco: Never  Vaping Use   Vaping Use: Never used  Substance Use Topics   Alcohol use: Yes    Alcohol/week: 1.0 standard drink    Types: 1 Glasses of wine per week    Comment: every evening   Drug use: No    Review of Systems Per HPI unless specifically indicated above     Objective:    BP (!) 126/47   Pulse 75   Ht 5\' 2"  (1.575 m)   Wt 114 lb 12.8 oz (52.1 kg)   SpO2 100%   BMI 21.00 kg/m   Wt Readings from Last 3 Encounters:  11/05/20 114 lb 12.8 oz (52.1 kg)  09/28/20 110 lb (49.9 kg)  09/27/20 110  lb (49.9 kg)    Physical Exam Vitals and nursing note reviewed.  Constitutional:      General: She is not in acute distress.    Appearance: Normal appearance. She is well-developed. She is not diaphoretic.     Comments: Well-appearing, comfortable, cooperative  HENT:     Head: Normocephalic and atraumatic.  Eyes:     General:        Right eye: No discharge.        Left eye: No discharge.     Conjunctiva/sclera: Conjunctivae normal.  Cardiovascular:     Rate and Rhythm: Normal rate.  Pulmonary:     Effort: Pulmonary effort is normal.  Musculoskeletal:     Comments: L Thumb triggering motion. Tender to touch  Skin:    General: Skin is warm and dry.     Findings: No erythema or rash.  Neurological:     Mental Status: She is alert and oriented to person, place, and time.  Psychiatric:        Mood and Affect: Mood normal.        Behavior: Behavior normal.        Thought Content: Thought content normal.  Comments: Well groomed, good eye contact, normal speech and thoughts   Results for orders placed or performed in visit on 09/28/20  Microscopic Examination   Urine  Result Value Ref Range   WBC, UA 0-5 0 - 5 /hpf   RBC None seen 0 - 2 /hpf   Epithelial Cells (non renal) 0-10 0 - 10 /hpf   Casts Present (A) None seen /lpf   Cast Type Hyaline casts N/A   Bacteria, UA None seen None seen/Few  Urinalysis, Complete  Result Value Ref Range   Specific Gravity, UA 1.020 1.005 - 1.030   pH, UA 5.5 5.0 - 7.5   Color, UA Yellow Yellow   Appearance Ur Clear Clear   Leukocytes,UA Negative Negative   Protein,UA Negative Negative/Trace   Glucose, UA Negative Negative   Ketones, UA Trace (A) Negative   RBC, UA Negative Negative   Bilirubin, UA Negative Negative   Urobilinogen, Ur 0.2 0.2 - 1.0 mg/dL   Nitrite, UA Negative Negative   Microscopic Examination See below:       Assessment & Plan:   Problem List Items Addressed This Visit     Primary insomnia - Primary    Relevant Medications   zolpidem (AMBIEN CR) 6.25 MG CR tablet   Other Visit Diagnoses     Needs flu shot       Relevant Orders   Flu Vaccine QUAD High Dose(Fluad) (Completed)   Trigger finger of left thumb       Relevant Orders   Ambulatory referral to Orthopedic Surgery   Chronic hand pain, left       Relevant Orders   Ambulatory referral to Orthopedic Surgery       Referral to [place] to Dr Roland Rack -may warrant other hand/thumb specialist if indicated.  START anti inflammatory topical - OTC Voltaren (generic Diclofenac) topical 2-4 times a day as needed for pain swelling of affected joint for 1-2 weeks or longer.  Try compression wrap or glove.  Insomnia Remain off Trazodone since ineffective at 50-100mg  Will switch to hypnotic agent instead. Generic Ambien Zolpidem CR 6.25, may adjust dose in future. Future consider Dayvigo or other options.  Orders Placed This Encounter  Procedures   Flu Vaccine QUAD High Dose(Fluad)   Ambulatory referral to Orthopedic Surgery    Referral Priority:   Routine    Referral Type:   Surgical    Referral Reason:   Specialty Services Required    Requested Specialty:   Orthopedic Surgery    Number of Visits Requested:   1      Meds ordered this encounter  Medications   zolpidem (AMBIEN CR) 6.25 MG CR tablet    Sig: Take 1 tablet (6.25 mg total) by mouth at bedtime as needed for sleep.    Dispense:  30 tablet    Refill:  2      Follow up plan: Return if symptoms worsen or fail to improve.   Nobie Putnam, [facility] 11/05/2020, 9:04 AM

## 2020-11-05 NOTE — Patient Instructions (Addendum)
Thank you for coming to the office today.  Referral to [facility] to Dr Roland Rack - if they cant treat this issue they may refer you elsewhere or to diff doctor, or we can refer to [facility], let me know.  START anti inflammatory topical - OTC Voltaren (generic Diclofenac) topical 2-4 times a day as needed for pain swelling of affected joint for 1-2 weeks or longer.  Try compression wrap or glove.  Generic Ambien Zolpidem CR 6.25   Please schedule a Follow-up Appointment to: Return if symptoms worsen or fail to improve.  If you have any other questions or concerns, please feel free to call the office or send a message through [facility]. You may also schedule an earlier appointment if necessary.  Additionally, you may be receiving a survey about your experience at our office within a few days to 1 week by e-mail or mail. We value your feedback.  Amber Putnam, DO [place]

## 2020-11-14 ENCOUNTER — Encounter: Payer: Self-pay | Admitting: Family Medicine

## 2020-11-14 DIAGNOSIS — F5101 Primary insomnia: Secondary | ICD-10-CM

## 2020-11-16 MED ORDER — QUVIVIQ 25 MG PO TABS: 25.0000 mg | ORAL_TABLET | Freq: Every day | ORAL | 2 refills | Status: DC

## 2020-11-16 NOTE — Addendum Note (Signed)
Addended by: Olin Hauser on: 11/16/2020 06:30 PM   Modules accepted: Orders

## 2020-12-13 NOTE — Addendum Note (Signed)
Addended by: Olin Hauser on: 12/13/2020 04:50 PM   Modules accepted: Orders

## 2020-12-31 DIAGNOSIS — M65312 Trigger thumb, left thumb: Secondary | ICD-10-CM | POA: Diagnosis not present

## 2021-01-03 ENCOUNTER — Other Ambulatory Visit: Payer: Self-pay | Admitting: Family Medicine

## 2021-01-03 DIAGNOSIS — R519 Headache, unspecified: Secondary | ICD-10-CM

## 2021-01-05 NOTE — Telephone Encounter (Signed)
Requested Prescriptions  Pending Prescriptions Disp Refills   ibuprofen (ADVIL) 800 MG tablet [Pharmacy Med Name: Ibuprofen 800 MG Oral Tablet] 90 tablet 0    Sig: TAKE 1 TABLET BY MOUTH EVERY 8 HOURS AS NEEDED FOR HEADACHE     Analgesics:  NSAIDS Passed - 01/03/2021  5:42 PM      Passed - Cr in normal range and within 360 days    Creat  Date Value Ref Range Status  02/11/2020 0.72 0.60 - 0.93 mg/dL Final    Comment:    For patients >75 years of age, the reference limit for Creatinine is approximately 13% higher for people identified as African-American. .    Creatinine, Ser  Date Value Ref Range Status  03/09/2020 0.80 0.44 - 1.00 mg/dL Final         Passed - HGB in normal range and within 360 days    Hemoglobin  Date Value Ref Range Status  02/11/2020 13.7 11.7 - 15.5 g/dL Final         Passed - Patient is not pregnant      Passed - Valid encounter within last 12 months    Recent Outpatient Visits          2 months ago Primary insomnia   [place], DO   4 months ago Intractable acute post-traumatic headache   [place], DO   6 months ago [place], DO   7 months ago Acute bacterial sinusitis   [facility], Coralie Keens, NP   9 months ago Acute neck pain   [facility] [place], Devonne Doughty, DO      Future Appointments            In 1 month Parks Ranger, Devonne Doughty, [facility], [place]   In 8 months Hollice Espy, [place]

## 2021-02-05 ENCOUNTER — Other Ambulatory Visit: Payer: Self-pay | Admitting: Family Medicine

## 2021-02-05 DIAGNOSIS — E782 Mixed hyperlipidemia: Secondary | ICD-10-CM

## 2021-02-05 NOTE — Telephone Encounter (Signed)
Requested Prescriptions  Pending Prescriptions Disp Refills   rosuvastatin (CRESTOR) 10 MG tablet [Pharmacy Med Name: Rosuvastatin Calcium 10 MG Oral Tablet] 90 tablet 0    Sig: TAKE 1 TABLET BY MOUTH AT BEDTIME     Cardiovascular:  Antilipid - Statins 2 Failed - 02/05/2021  2:19 AM      Failed - Lipid Panel in normal range within the last 12 months    Cholesterol  Date Value Ref Range Status  02/11/2020 176 <200 mg/dL Final   LDL Cholesterol (Calc)  Date Value Ref Range Status  02/11/2020 77 mg/dL (calc) Final    Comment:    Reference range: <100 . Desirable range <100 mg/dL for primary prevention;   <70 mg/dL for patients with CHD or diabetic patients  with > or = 2 CHD risk factors. Marland Kitchen LDL-C is now calculated using the Martin-Hopkins  calculation, which is a validated novel method providing  better accuracy than the Friedewald equation in the  estimation of LDL-C.  Cresenciano Genre et al. Annamaria Helling. 4580;998(33): 2061-2068  (http://education.[employer].com/faq/FAQ164)    HDL  Date Value Ref Range Status  02/11/2020 82 > OR = 50 mg/dL Final   Triglycerides  Date Value Ref Range Status  02/11/2020 90 <150 mg/dL Final         Passed - Cr in normal range and within 360 days    Creat  Date Value Ref Range Status  02/11/2020 0.72 0.60 - 0.93 mg/dL Final    Comment:    For patients >15 years of age, the reference limit for Creatinine is approximately 13% higher for people identified as African-American. .    Creatinine, Ser  Date Value Ref Range Status  03/09/2020 0.80 0.44 - 1.00 mg/dL Final         Passed - Patient is not pregnant      Passed - Valid encounter within last 12 months    Recent Outpatient Visits          3 months ago Primary insomnia   [place], DO   5 months ago Intractable acute post-traumatic headache   [place], DO   7 months ago [place], DO   8 months ago Acute bacterial sinusitis   [facility], Coralie Keens, NP   10 months ago Acute neck pain   [facility] Parks Ranger, Devonne Doughty, DO      Future Appointments            In 2 weeks Parks Ranger, Devonne Doughty, [facility], [place]   In 7 months Hollice Espy, [place]

## 2021-02-10 ENCOUNTER — Other Ambulatory Visit: Payer: Self-pay

## 2021-02-10 ENCOUNTER — Ambulatory Visit (INDEPENDENT_AMBULATORY_CARE_PROVIDER_SITE_OTHER): Payer: PPO | Admitting: Family Medicine

## 2021-02-10 ENCOUNTER — Encounter: Payer: Self-pay | Admitting: Family Medicine

## 2021-02-10 VITALS — Ht 62.0 in | Wt 110.0 lb

## 2021-02-10 DIAGNOSIS — J0110 Acute frontal sinusitis, unspecified: Secondary | ICD-10-CM | POA: Diagnosis not present

## 2021-02-10 DIAGNOSIS — J301 Allergic rhinitis due to pollen: Secondary | ICD-10-CM

## 2021-02-10 MED ORDER — FEXOFENADINE HCL 180 MG PO TABS: 180.0000 mg | ORAL_TABLET | Freq: Every day | ORAL | 3 refills | Status: DC

## 2021-02-10 MED ORDER — IPRATROPIUM BROMIDE 0.06 % NA SOLN
2.0000 | Freq: Four times a day (QID) | NASAL | 0 refills | Status: DC
Start: 2021-02-10 — End: 2021-02-21

## 2021-02-10 MED ORDER — AZITHROMYCIN 250 MG PO TABS: ORAL_TABLET | ORAL | 0 refills | Status: DC

## 2021-02-10 NOTE — Progress Notes (Signed)
Virtual Visit via Telephone The purpose of this virtual visit is to provide medical care while limiting exposure to the novel coronavirus (COVID19) for both patient and office staff.  Consent was obtained for phone visit:  Yes.   Answered questions that patient had about telehealth interaction:  Yes.   I discussed the limitations, risks, security and privacy concerns of performing an evaluation and management service by telephone. I also discussed with the patient that there may be a patient responsible charge related to this service. The patient expressed understanding and agreed to proceed.  Patient Location: Home Provider Location: [facility] (Office)  Participants in virtual visit: - Patient: Amber Brooks - CMA: Orinda Kenner, [place] - Provider: Dr Parks Ranger  ---------------------------------------------------------------------- Chief Complaint  Patient presents with   Headache   Nasal Congestion    S: Reviewed CMA documentation. I have called patient and gathered additional HPI as follows:  Sinusitis Reports that symptoms started 5 days ago on Saturday, started as sinus drainage congestion headaches, seems similar to allergies sinus drainage. Worse in PM with sinus drainage back of throat. Some cough at night - Tried OTC benadryl, mucinex  She did have sinus headache present for 3 weeks prior. She made apt with ENT   Admits sinus pain or pressure, headache  Denies any known or suspected exposure to person with or possibly with COVID19.  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   Brain aneurysm    Complication of anesthesia    Depression    GERD (gastroesophageal reflux disease)    Headache(784.0)    migraines   History of chicken pox    Hyperlipidemia    Hypertension    Mitral valve prolapse    PONV  (postoperative nausea and vomiting)    Shingles    Social History   Tobacco Use   Smoking status: Never   Smokeless tobacco: Never  Vaping Use   Vaping Use: Never used  Substance Use Topics   Alcohol use: Yes    Alcohol/week: 1.0 standard drink    Types: 1 Glasses of wine per week    Comment: every evening   Drug use: No    Current Outpatient Medications:    amLODipine (NORVASC) 10 MG tablet, Take 1 tablet (10 mg total) by mouth daily., Disp: 90 tablet, Rfl: 3   azithromycin (ZITHROMAX Z-PAK) 250 MG tablet, Take 2 tabs (500mg  total) on Day 1. Take 1 tab (250mg ) daily for next 4 days., Disp: 6 tablet, Rfl: 0   cholecalciferol (VITAMIN D3) 25 MCG (1000 UNIT) tablet, Take 1,000 Units by mouth daily., Disp: , Rfl:    esomeprazole (NEXIUM) 40 MG capsule, Take 40 mg by mouth daily before breakfast., Disp: , Rfl:    fexofenadine (ALLEGRA) 180 MG tablet, Take 1 tablet (180 mg total) by mouth daily., Disp: 90 tablet, Rfl: 3   fluticasone (FLONASE) 50 MCG/ACT nasal spray, Place into both nostrils daily., Disp: , Rfl:    ibuprofen (ADVIL) 800 MG tablet, TAKE 1 TABLET BY MOUTH EVERY 8 HOURS AS NEEDED FOR HEADACHE, Disp: 90 tablet, Rfl: 0   ipratropium (ATROVENT) 0.06 % nasal spray, Place 2 sprays into both nostrils 4 (four) times daily. For up to 5-7 days then stop., Disp: 15 mL, Rfl: 0   Multiple Vitamin (MULTIVITAMIN WITH MINERALS) TABS tablet, Take 1 tablet by mouth daily., Disp: ,  Rfl:    NON FORMULARY, Take 1 capsule by mouth daily. Tru Niagen, Disp: , Rfl:    NON FORMULARY, Take 1 capsule by mouth daily. Cordyceps, Disp: , Rfl:    Omega 3 1000 MG CAPS, Take 1,000 mg by mouth daily., Disp: , Rfl:    ondansetron (ZOFRAN ODT) 4 MG disintegrating tablet, Take 1 tablet (4 mg total) by mouth every 8 (eight) hours as needed for nausea or vomiting., Disp: 30 tablet, Rfl: 0   OVER THE COUNTER MEDICATION, Take 1 tablet by mouth daily. Appetite support, Disp: , Rfl:    Probiotic Product (PROBIOTIC-10  PO), Take 1 capsule by mouth daily., Disp: , Rfl:    rizatriptan (MAXALT) 10 MG tablet, Take 1 tablet (10 mg total) by mouth as needed for migraine. May repeat in 2 hours if needed, Disp: 10 tablet, Rfl: 2   rosuvastatin (CRESTOR) 10 MG tablet, TAKE 1 TABLET BY MOUTH AT BEDTIME, Disp: 90 tablet, Rfl: 0   venlafaxine XR (EFFEXOR-XR) 150 MG 24 hr capsule, Take 1 capsule (150 mg total) by mouth daily with breakfast., Disp: 90 capsule, Rfl: 3   vitamin B-12 (CYANOCOBALAMIN) 1000 MCG tablet, Take 1,000 mcg by mouth daily., Disp: , Rfl:    vitamin C (ASCORBIC ACID) 500 MG tablet, Take 500 mg by mouth daily., Disp: , Rfl:    carvedilol (COREG) 25 MG tablet, Take 1 tablet (25 mg total) by mouth 2 (two) times daily., Disp: 60 tablet, Rfl: 5  Depression screen [facility] 2/9 02/10/2021 11/05/2020 06/22/2020  Decreased Interest 0 0 0  Down, Depressed, Hopeless 0 0 0  PHQ - 2 Score 0 0 0  Altered sleeping 3 3 0  Tired, decreased energy 0 0 0  Change in appetite 0 0 0  Feeling bad or failure about yourself  0 0 0  Trouble concentrating 0 0 0  Moving slowly or fidgety/restless 0 0 0  Suicidal thoughts 0 0 0  PHQ-9 Score 3 3 0  Difficult doing work/chores Not difficult at all Not difficult at all Not difficult at all    GAD 7 : Generalized Anxiety Score 02/10/2021 11/05/2020 06/22/2020 12/18/2019  Nervous, Anxious, on Edge 0 0 0 0  Control/stop worrying 0 0 0 0  Worry too much - different things 0 0 0 0  Trouble relaxing 0 0 0 1  Restless 0 0 0 0  Easily annoyed or irritable 0 0 0 1  Afraid - awful might happen 0 0 0 0  Total GAD 7 Score 0 0 0 2  Anxiety Difficulty Not difficult at all Not difficult at all Not difficult at all Somewhat difficult    -------------------------------------------------------------------------- O: No physical exam performed due to remote telephone encounter.  Lab results reviewed.  No results found for this or any previous visit (from the past 2160  hour(s)).  -------------------------------------------------------------------------- A&P:  Problem List Items Addressed This Visit     Allergic rhinitis due to pollen   Relevant Medications   ipratropium (ATROVENT) 0.06 % nasal spray   fexofenadine (ALLEGRA) 180 MG tablet   Other Visit Diagnoses     Acute non-recurrent frontal sinusitis    -  Primary   Relevant Medications   ipratropium (ATROVENT) 0.06 % nasal spray   fexofenadine (ALLEGRA) 180 MG tablet   azithromycin (ZITHROMAX Z-PAK) 250 MG tablet      Consistent with acute frontal rhinosinusitis, likely initially viral URI vs allergic rhinitis component without obvious concern for bacterial infection today. But has had  some sinus pain pressure 3 weeks now worse in past 5 days.  In past has required antibiotic after persistent sinusitis Upcoming ENT in  March 2023  Plan: 1. Reassurance, likely self-limited - no indication for antibiotics at this time - Will agree to add Empiric Zpak antibiotic as back up plan if not improved by next 24-72 hours 2. Start rx generic Allegra 180mg  daily 3. Start Atrovent nasal spray decongestant 2 sprays in each nostril up to 4 times daily for 7 days 4. Supportive care OTC  Return criteria reviewed  Future consider Azelastine nasal   Meds ordered this encounter  Medications   ipratropium (ATROVENT) 0.06 % nasal spray    Sig: Place 2 sprays into both nostrils 4 (four) times daily. For up to 5-7 days then stop.    Dispense:  15 mL    Refill:  0   fexofenadine (ALLEGRA) 180 MG tablet    Sig: Take 1 tablet (180 mg total) by mouth daily.    Dispense:  90 tablet    Refill:  3   azithromycin (ZITHROMAX Z-PAK) 250 MG tablet    Sig: Take 2 tabs (500mg  total) on Day 1. Take 1 tab (250mg ) daily for next 4 days.    Dispense:  6 tablet    Refill:  0    Follow-up: - Return as needed  Patient verbalizes understanding with the above medical recommendations including the limitation of remote  medical advice.  Specific follow-up and call-back criteria were given for patient to follow-up or seek medical care more urgently if needed.   - Time spent in direct consultation with patient on phone: 7 minutes   Nobie Putnam, [facility] 02/10/2021, 10:30 AM

## 2021-02-10 NOTE — Patient Instructions (Addendum)
Will agree to add Empiric Zpak antibiotic as back up plan if not improved by next 24-72 hours Start rx generic Allegra 180mg  daily Start Atrovent nasal spray decongestant 2 sprays in each nostril up to 4 times daily for 7 days  Future consider adding Azelastine nasal spray for allergies  Please schedule a Follow-up Appointment to: Return if symptoms worsen or fail to improve.  If you have any other questions or concerns, please feel free to call the office or send a message through [place]. You may also schedule an earlier appointment if necessary.  Additionally, you may be receiving a survey about your experience at our office within a few days to 1 week by e-mail or mail. We value your feedback.  Nobie Putnam, DO [place]

## 2021-02-14 ENCOUNTER — Other Ambulatory Visit: Payer: PPO

## 2021-02-14 DIAGNOSIS — F3342 Major depressive disorder, recurrent, in full remission: Secondary | ICD-10-CM | POA: Diagnosis not present

## 2021-02-14 DIAGNOSIS — Z0000 Encounter for general adult medical examination without abnormal findings: Secondary | ICD-10-CM | POA: Diagnosis not present

## 2021-02-14 DIAGNOSIS — I10 Essential (primary) hypertension: Secondary | ICD-10-CM | POA: Diagnosis not present

## 2021-02-14 DIAGNOSIS — I110 Hypertensive heart disease with heart failure: Secondary | ICD-10-CM

## 2021-02-14 DIAGNOSIS — R7309 Other abnormal glucose: Secondary | ICD-10-CM

## 2021-02-14 DIAGNOSIS — E782 Mixed hyperlipidemia: Secondary | ICD-10-CM | POA: Diagnosis not present

## 2021-02-14 DIAGNOSIS — Z1159 Encounter for screening for other viral diseases: Secondary | ICD-10-CM | POA: Diagnosis not present

## 2021-02-15 LAB — COMPLETE METABOLIC PANEL WITH GFR
AG Ratio: 2 (calc) (ref 1.0–2.5)
ALT: 19 U/L (ref 6–29)
AST: 15 U/L (ref 10–35)
Albumin: 4.5 g/dL (ref 3.6–5.1)
Alkaline phosphatase (APISO): 52 U/L (ref 37–153)
BUN: 18 mg/dL (ref 7–25)
CO2: 28 mmol/L (ref 20–32)
Calcium: 9.9 mg/dL (ref 8.6–10.4)
Chloride: 106 mmol/L (ref 98–110)
Creat: 0.75 mg/dL (ref 0.60–1.00)
Globulin: 2.3 g/dL (calc) (ref 1.9–3.7)
Glucose, Bld: 102 mg/dL — ABNORMAL HIGH (ref 65–99)
Potassium: 4.6 mmol/L (ref 3.5–5.3)
Sodium: 143 mmol/L (ref 135–146)
Total Bilirubin: 0.3 mg/dL (ref 0.2–1.2)
Total Protein: 6.8 g/dL (ref 6.1–8.1)
eGFR: 83 mL/min/{1.73_m2} (ref >=60)

## 2021-02-15 LAB — CBC WITH DIFFERENTIAL/PLATELET
Absolute Monocytes: 492 cells/uL (ref 200–950)
Basophils Absolute: 48 cells/uL (ref 0–200)
Basophils Relative: 0.8 %
Eosinophils Absolute: 258 cells/uL (ref 15–500)
Eosinophils Relative: 4.3 %
HCT: 42.8 % (ref 35.0–45.0)
Hemoglobin: 13.9 g/dL (ref 11.7–15.5)
Lymphs Abs: 1248 cells/uL (ref 850–3900)
MCH: 30.8 pg (ref 27.0–33.0)
MCHC: 32.5 g/dL (ref 32.0–36.0)
MCV: 94.7 fL (ref 80.0–100.0)
MPV: 10.9 fL (ref 7.5–12.5)
Monocytes Relative: 8.2 %
Neutro Abs: 3954 cells/uL (ref 1500–7800)
Neutrophils Relative %: 65.9 %
Platelets: 256 10*3/uL (ref 140–400)
RBC: 4.52 10*6/uL (ref 3.80–5.10)
RDW: 12 % (ref 11.0–15.0)
Total Lymphocyte: 20.8 %
WBC: 6 10*3/uL (ref 3.8–10.8)

## 2021-02-15 LAB — HEMOGLOBIN A1C
Hgb A1c MFr Bld: 5.3 % of total Hgb (ref <5.7)
Mean Plasma Glucose: 105 mg/dL
eAG (mmol/L): 5.8 mmol/L

## 2021-02-15 LAB — LIPID PANEL
Cholesterol: 181 mg/dL (ref <200)
HDL: 80 mg/dL (ref >=50)
LDL Cholesterol (Calc): 75 mg/dL (calc)
Non-HDL Cholesterol (Calc): 101 mg/dL (calc) (ref <130)
Total CHOL/HDL Ratio: 2.3 (calc) (ref <5.0)
Triglycerides: 160 mg/dL — ABNORMAL HIGH (ref <150)

## 2021-02-15 LAB — HEPATITIS C ANTIBODY
Hepatitis C Ab: NONREACTIVE
SIGNAL TO CUT-OFF: 0.03 (ref <1.00)

## 2021-02-15 LAB — TSH: TSH: 2.7 mIU/L (ref 0.40–4.50)

## 2021-02-21 ENCOUNTER — Ambulatory Visit (INDEPENDENT_AMBULATORY_CARE_PROVIDER_SITE_OTHER): Payer: PPO | Admitting: Family Medicine

## 2021-02-21 ENCOUNTER — Encounter: Payer: Self-pay | Admitting: Family Medicine

## 2021-02-21 ENCOUNTER — Ambulatory Visit (INDEPENDENT_AMBULATORY_CARE_PROVIDER_SITE_OTHER): Payer: PPO

## 2021-02-21 ENCOUNTER — Other Ambulatory Visit: Payer: Self-pay

## 2021-02-21 VITALS — BP 120/50 | HR 70 | Temp 97.9°F | Resp 17 | Ht 62.0 in | Wt 111.0 lb

## 2021-02-21 VITALS — BP 120/50 | HR 70 | Temp 97.9°F | Resp 18 | Ht 62.0 in | Wt 111.0 lb

## 2021-02-21 DIAGNOSIS — F3342 Major depressive disorder, recurrent, in full remission: Secondary | ICD-10-CM

## 2021-02-21 DIAGNOSIS — I701 Atherosclerosis of renal artery: Secondary | ICD-10-CM

## 2021-02-21 DIAGNOSIS — Z1231 Encounter for screening mammogram for malignant neoplasm of breast: Secondary | ICD-10-CM | POA: Diagnosis not present

## 2021-02-21 DIAGNOSIS — H40013 Open angle with borderline findings, low risk, bilateral: Secondary | ICD-10-CM | POA: Diagnosis not present

## 2021-02-21 DIAGNOSIS — Z0000 Encounter for general adult medical examination without abnormal findings: Secondary | ICD-10-CM

## 2021-02-21 DIAGNOSIS — Z780 Asymptomatic menopausal state: Secondary | ICD-10-CM | POA: Diagnosis not present

## 2021-02-21 DIAGNOSIS — E782 Mixed hyperlipidemia: Secondary | ICD-10-CM | POA: Diagnosis not present

## 2021-02-21 DIAGNOSIS — J301 Allergic rhinitis due to pollen: Secondary | ICD-10-CM | POA: Diagnosis not present

## 2021-02-21 MED ORDER — FLUTICASONE PROPIONATE 50 MCG/ACT NA SUSP: 2.0000 | Freq: Every day | NASAL | 3 refills | Status: DC

## 2021-02-21 MED ORDER — ROSUVASTATIN CALCIUM 10 MG PO TABS: 10.0000 mg | ORAL_TABLET | Freq: Every day | ORAL | 3 refills | Status: DC

## 2021-02-21 NOTE — Progress Notes (Addendum)
HPI:  Patient presents to clinic today for their subsequent annual Medicare wellness exam.  Past Medical History:  Diagnosis Date   Anxiety    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   Brain aneurysm    Complication of anesthesia    Depression    GERD (gastroesophageal reflux disease)    Headache(784.0)    migraines   History of chicken pox    Hyperlipidemia    Hypertension    Mitral valve prolapse    PONV (postoperative nausea and vomiting)    Shingles     Current Outpatient Medications  Medication Sig Dispense Refill   amLODipine (NORVASC) 10 MG tablet Take 1 tablet (10 mg total) by mouth daily. 90 tablet 3   carvedilol (COREG) 25 MG tablet Take 1 tablet (25 mg total) by mouth 2 (two) times daily. 60 tablet 5   cholecalciferol (VITAMIN D3) 25 MCG (1000 UNIT) tablet Take 1,000 Units by mouth daily.     esomeprazole (NEXIUM) 40 MG capsule Take 40 mg by mouth 2 (two) times daily before a meal.     fluticasone (FLONASE) 50 MCG/ACT nasal spray Place 2 sprays into both nostrils daily.     ibuprofen (ADVIL) 800 MG tablet TAKE 1 TABLET BY MOUTH EVERY 8 HOURS AS NEEDED FOR HEADACHE 90 tablet 0   Multiple Vitamin (MULTIVITAMIN WITH MINERALS) TABS tablet Take 1 tablet by mouth daily.     NON FORMULARY Take 1 capsule by mouth daily. Tru Niagen     NON FORMULARY Take 1 capsule by mouth daily. Cordyceps     Omega 3 1000 MG CAPS Take 1,000 mg by mouth daily.     Probiotic Product (PROBIOTIC-10 PO) Take 1 capsule by mouth daily.     rizatriptan (MAXALT) 10 MG tablet Take 1 tablet (10 mg total) by mouth as needed for migraine. May repeat in 2 hours if needed 10 tablet 2   rosuvastatin (CRESTOR) 10 MG tablet TAKE 1 TABLET BY MOUTH AT BEDTIME 90 tablet 0   traZODone (DESYREL) 50 MG tablet Take 50 mg by mouth at bedtime.     venlafaxine XR (EFFEXOR-XR) 150 MG 24 hr capsule Take 1 capsule (150 mg total) by mouth daily with breakfast. 90 capsule 3   vitamin B-12 (CYANOCOBALAMIN) 1000 MCG tablet Take 1,000 mcg by mouth  daily.     vitamin C (ASCORBIC ACID) 500 MG tablet Take 500 mg by mouth daily.     No current facility-administered medications for this visit.    Allergies  Allergen Reactions   Latex Anaphylaxis   Hydromorphone Itching    Family History  Problem Relation Age of Onset   Stroke Father    Heart disease Father    Dementia Father    Dementia Brother     Social History   Socioeconomic History   Marital status: Divorced    Spouse name: Not on file   Number of children: 1   Years of education: collerge   Highest education level: Not on file  Occupational History   Occupation: [employer]  Tobacco Use   Smoking status: Never   Smokeless tobacco: Never  Vaping Use   Vaping Use: Never used  Substance and Sexual Activity   Alcohol use: Yes    Alcohol/week: 1.0 standard drink    Types: 1 Glasses of wine per week    Comment: every evening   Drug use: No   Sexual activity: Not Currently  Other Topics Concern   Not on file  Social History Narrative  Right handed.  Caffeine decaf 1 cup in am.  Divorced.  1 adopted daughter.  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    Social Determinants of Health   Financial Resource Strain: Low Risk    Difficulty of Paying Living Expenses: Not hard at all  Food Insecurity: No Food Insecurity   Worried About Charity fundraiser in the Last Year: Never true   [place] in the Last Year: Never true  Transportation Needs: No Transportation Needs   Lack of Transportation (Medical): No   Lack of Transportation (Non-Medical): No  Physical Activity: Insufficiently Active   Days of Exercise per Week: 4 days   Minutes of Exercise per Session: 30 min  Stress: No Stress Concern Present   Feeling of Stress : Only a little  Social Connections: Moderately Isolated   Frequency of Communication with Friends and Family: More than three times a week   Frequency of Social Gatherings with Friends and Family: More than three times a week   Attends Religious  Services: 1 to 4 times per year   Active Member of Clubs or Organizations: No   Attends Archivist Meetings: Never   Marital Status: Divorced  Human resources officer Violence: Not on file    Hospitiliaztions: No hospital visit within the past 12 mths   Health Maintenance:    Flu: 11/05/220  Tetanus: 09/11/2012   Pneumovax: 08/16/2015   Prevnar: 10/06/2013   Zostavax: Pt declined the shingrix    Covid: 10/10/2019,03/18/19, 02/16/19 Mammogram: 02/04/2016, 11/06/13, 07/12/12 Pap Smear:  Abdominal Hysterectomy 1987  Bone Density: 02/04/2016 [facility]) Colon Screening: 09/67/2022, Laurine Blazer, PA [facility] Doctor: Eye Exam 02/21/21 Dental Exam: Annually    Providers:  PCP: Nobie Putnam, DO Gastroenterologist: Laurine Blazer, PA Neurologist: Ward Givens, MD  Orthopaedic: Milagros Evener, MD Urologist: Hollice Espy, MD  I have personally reviewed and have noted:  1. The patient's medical and social history 2. Their use of alcohol, tobacco or illicit drugs 3. Their current medications and supplements 4. The patient's functional ability including ADL's, fall risks, home safety risks and hearing or visual impairment. 5. Diet and physical activities 6. Evidence for depression or mood disorder  Subjective:   Review of Systems:   Constitutional: Denies fever, malaise, fatigue, headache or abrupt weight changes.  HEENT: Denies eye pain, eye redness, ear pain, ringing in the ears, wax buildup, runny nose, nasal congestion, bloody nose, or sore throat. Respiratory: Denies difficulty breathing, shortness of breath, cough or sputum production.   Cardiovascular: Denies chest pain, chest tightness, palpitations or swelling in the hands or feet.  Gastrointestinal: Denies abdominal pain, bloating, constipation, diarrhea or blood in the stool.  GU: Denies urgency, frequency, pain with urination, burning sensation, blood in urine, odor or  discharge. Musculoskeletal: Denies decrease in range of motion, difficulty with gait, muscle pain or joint pain and swelling.  Skin: Denies redness, rashes, lesions or ulcercations.  Neurological: Denies dizziness, difficulty with memory, difficulty with speech or problems with balance and coordination.  Psych: Denies anxiety, depression, SI/HI.  No other specific complaints in a complete review of systems (except as listed in HPI above).  Objective:  PE:   BP (!) 120/50 (BP Location: Right Arm, Patient Position: Sitting, Cuff Size: Normal)    Pulse 70    Temp 97.9 F (36.6 C) (Temporal)    Resp 17    Ht 5\' 2"  (1.575 m)    Wt 111 lb (50.3 kg)    SpO2 100%  BMI 20.30 kg/m  Wt Readings from Last 3 Encounters:  02/21/21 111 lb (50.3 kg)  02/21/21 111 lb (50.3 kg)  02/10/21 110 lb (49.9 kg)     BMET    Component Value Date/Time   NA 143 02/14/2021 0811   NA 144 01/12/2020 1438   K 4.6 02/14/2021 0811   CL 106 02/14/2021 0811   CO2 28 02/14/2021 0811   GLUCOSE 102 (H) 02/14/2021 0811   BUN 18 02/14/2021 0811   BUN 15 01/12/2020 1438   CREATININE 0.75 02/14/2021 0811   CALCIUM 9.9 02/14/2021 0811   GFRNONAA >60 03/09/2020 1351   GFRAA 90 01/12/2020 1438    Lipid Panel     Component Value Date/Time   CHOL 181 02/14/2021 0811   TRIG 160 (H) 02/14/2021 0811   HDL 80 02/14/2021 0811   CHOLHDL 2.3 02/14/2021 0811   VLDL 33 04/07/2014 0425   LDLCALC 75 02/14/2021 0811    CBC    Component Value Date/Time   WBC 6.0 02/14/2021 0811   RBC 4.52 02/14/2021 0811   HGB 13.9 02/14/2021 0811   HCT 42.8 02/14/2021 0811   PLT 256 02/14/2021 0811   MCV 94.7 02/14/2021 0811   MCH 30.8 02/14/2021 0811   MCHC 32.5 02/14/2021 0811   RDW 12.0 02/14/2021 0811   LYMPHSABS 1,248 02/14/2021 0811   MONOABS 0.4 04/26/2014 1055   EOSABS 258 02/14/2021 0811   BASOSABS 48 02/14/2021 0811    Hgb A1C Lab Results  Component Value Date   HGBA1C 5.3 02/14/2021      Assessment and  Plan:   Medicare Annual Wellness Visit:  Diet: Heart healthy  Physical activity:  Exercise 30 minute Depression/mood screen: Negative Flowsheet Row Clinical Support from 02/21/2021 in [place]  PHQ-9 Total Score 1      Hearing: Intact to whispered voice Visual acuity: Grossly normal, performs annual eye exam  ADLs: Capable Fall risk: None Home safety: Good Cognitive evaluation:  6CIT Screen 02/21/2021  What Year? 0 points  What month? 0 points  What time? 0 points  Count back from 20 0 points  Months in reverse 0 points  Repeat phrase 0 points  Total Score 0     EOL planning: No Adv directives, full code  Next appointment: f/u 1 year for Medicare wellness Exam    Wilson Singer, CMA

## 2021-02-21 NOTE — Progress Notes (Signed)
Subjective:    Patient ID: Amber Brooks, female    DOB: 1945/02/12, 76 y.o.   MRN: [id number]  Amber Brooks is a 76 y.o. female presenting on 02/21/2021 for Annual Exam  Patient has also seen Donnie Mesa CMA for AMW today as well.  HPI  Here for Annual Physical and Lab Review.  A1c 5.3 normal range Fasting CBG 102  HYPERLIPIDEMIA: - Reports no concerns. Last lipid panel 2023, controlled LDL 70s - Currently taking Rosuvastatin 21m nightly, tolerating well without side effects or myalgias   UTI S/p Cholecystectomy in 2018 Chronic Diarrhea episodic Hx UTI in past trigger from diarrhea from chronic digestive problems. Takes OTC AZO She takes Total Restore for gut health supplement. Denies any hematuria, odor   Intermittent palpitations Syncopal Episode Chest Tightness Persistent issues following 10/2019 COVID Booster vaccine [facility]   CHRONIC HTN: Reports history of elevated BP had been controlled Current Meds - Amlodipine 116mdaily, Coreg 2565mID  Reports good compliance, took meds today. Tolerating well, w/o complaints. 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 She had remote history years ago about taking too many Xanax pills - Admits insomnia issue with waking up overnight in past, she has tried MIrtazapine and then recently Trazodone, difficulty maintaining sleep, she will wake up overnight then difficulty falling back asleep     History of Low Back Pain S/p surgery Lumbar Laminectomy decompression 04/2014 Dr ElsEllene Routee has done PT in past about 2-3 years ago She still has episodes of R lower back pain.     Health Maintenance: UTD COVID Vaccine needs copy of booster. Colonoscopy reported 2019 [facility] will need record.  Will try again to locate record she may have copy  [facility]  DEXA Due  [facility] 02/21/2021 02/21/2021 02/10/2021  Decreased Interest 0 0 0  Down, Depressed, Hopeless 0 0 0  PHQ - 2 Score 0 0 0  Altered sleeping 1 1 3   Tired, decreased energy 0 0 0  Change in appetite 0 0 0  Feeling bad or failure about yourself  0 0 0  Trouble concentrating 0 0 0  Moving slowly or fidgety/restless 0 0 0  Suicidal thoughts 0 0 0  PHQ-9 Score 1 1 3   Difficult doing work/chores Not difficult at all Not difficult at all Not difficult at all    Past Medical History:  Diagnosis Date   Anxiety    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   Brain aneurysm    Complication of anesthesia    Depression    GERD (gastroesophageal reflux disease)    Headache(784.0)    migraines   History of chicken pox    Hyperlipidemia    Hypertension    Mitral valve prolapse    PONV (postoperative nausea and vomiting)    Shingles    Past Surgical History:  Procedure Laterality Date   [id number]   COLONOSCOPY N/A 09/27/2020   Procedure: COLONOSCOPY;  Surgeon: RusAnnamaria HellingO;  [facility];  Service: Gastroenterology;  Laterality: N/A;   CRANIOTOMY Left 11/15/2012   Procedure: CRANIOTOMY INTRACRANIAL ANEURYSM FOR CAROTID;  Surgeon: KylWinfield Cunas  MD;  Location: [place] NEURO ORS;  Service: Neurosurgery;  Laterality: Left;  LEFT Craniotomy for Aneurysm   ECTOPIC PREGNANCY SURGERY     ESOPHAGOGASTRODUODENOSCOPY N/A 09/27/2020   Procedure: ESOPHAGOGASTRODUODENOSCOPY (EGD);  Surgeon: Annamaria Helling, DO;  [facility];  Service: Gastroenterology;  Laterality: N/A;   KNEE ARTHROSCOPY     LUMBAR LAMINECTOMY/DECOMPRESSION MICRODISCECTOMY N/A 04/27/2014   Procedure: LUMBAR LAMINECTOMY/DECOMPRESSION MICRODISCECTOMY LUMBAR FOUR-FIVE ;  Surgeon: Kristeen Miss, MD;  Location:  [place] NEURO ORS;  Service: Neurosurgery;  Laterality: N/A;   RADIOLOGY WITH ANESTHESIA N/A 10/09/2012   Procedure: RADIOLOGY WITH ANESTHESIA;  Surgeon: Rob Hickman, MD;  Location: [place];  Service: Radiology;  Laterality: N/A;   RENAL ANGIOGRAPHY N/A 03/09/2020   Procedure: RENAL ANGIOGRAPHY;  Surgeon: Katha Cabal, MD;  Location: [place] CV LAB;  Service: Cardiovascular;  Laterality: N/A;   TONSILLECTOMY     VASCULAR SURGERY     stent placed   Social History   Socioeconomic History   Marital status: Divorced    Spouse name: Not on file   Number of children: 1   Years of education: collerge   Highest education level: Not on file  Occupational History   Occupation: [employer]  Tobacco Use   Smoking status: Never   Smokeless tobacco: Never  Vaping Use   Vaping Use: Never used  Substance and Sexual Activity   Alcohol use: Yes    Alcohol/week: 1.0 standard drink    Types: 1 Glasses of wine per week    Comment: every evening   Drug use: No   Sexual activity: Not Currently  Other Topics Concern   Not on file  Social History Narrative   Right handed.  Caffeine decaf 1 cup in am.  Divorced.  1 adopted daughter.  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    Social Determinants of Health   Financial Resource Strain: Low Risk    Difficulty of Paying Living Expenses: Not hard at all  Food Insecurity: No Food Insecurity   Worried About Charity fundraiser in the Last Year: Never true   [place] in the Last Year: Never true  Transportation Needs: No Transportation Needs   Lack of Transportation (Medical): No   Lack of Transportation (Non-Medical): No  Physical Activity: Insufficiently Active   Days of Exercise per Week: 4 days   Minutes of Exercise per Session: 30 min  Stress: No Stress Concern Present   Feeling of Stress : Only a little  Social Connections: Moderately Isolated   Frequency of Communication with Friends and Family: More than three times a week    Frequency of Social Gatherings with Friends and Family: More than three times a week   Attends Religious Services: 1 to 4 times per year   Active Member of [employer] or Organizations: No   Attends Archivist Meetings: Never   Marital Status: Divorced  Human resources officer Violence: Not on file   Family History  Problem Relation Age of Onset   Stroke Father    Heart disease Father    Dementia Father    Dementia Brother    Current Outpatient Medications on File Prior to Visit  Medication Sig   amLODipine (NORVASC) 10 MG tablet Take 1 tablet (10 mg total) by mouth daily.   cholecalciferol (VITAMIN D3) 25 MCG (1000 UNIT) tablet Take 1,000 Units by mouth daily.   esomeprazole (NEXIUM) 40 MG capsule Take 40 mg by mouth 2 (two) times daily before a meal.  ibuprofen (ADVIL) 800 MG tablet TAKE 1 TABLET BY MOUTH EVERY 8 HOURS AS NEEDED FOR HEADACHE   Multiple Vitamin (MULTIVITAMIN WITH MINERALS) TABS tablet Take 1 tablet by mouth daily.   NON FORMULARY Take 1 capsule by mouth daily. Tru Niagen   NON FORMULARY Take 1 capsule by mouth daily. Cordyceps   Omega 3 1000 MG CAPS Take 1,000 mg by mouth daily.   Probiotic Product (PROBIOTIC-10 PO) Take 1 capsule by mouth daily.   rizatriptan (MAXALT) 10 MG tablet Take 1 tablet (10 mg total) by mouth as needed for migraine. May repeat in 2 hours if needed   venlafaxine XR (EFFEXOR-XR) 150 MG 24 hr capsule Take 1 capsule (150 mg total) by mouth daily with breakfast.   vitamin B-12 (CYANOCOBALAMIN) 1000 MCG tablet Take 1,000 mcg by mouth daily.   vitamin C (ASCORBIC ACID) 500 MG tablet Take 500 mg by mouth daily.   carvedilol (COREG) 25 MG tablet Take 1 tablet (25 mg total) by mouth 2 (two) times daily.   traZODone (DESYREL) 50 MG tablet Take 50 mg by mouth at bedtime.   [DISCONTINUED] diphenhydrAMINE (BENADRYL) 25 MG tablet Take 25 mg by mouth daily as needed for allergies.   [DISCONTINUED] ezetimibe-simvastatin (VYTORIN) 10-40 MG per tablet Take 1  tablet by mouth at bedtime.    No current facility-administered medications on file prior to visit.    Review of Systems  Constitutional:  Negative for activity change, appetite change, chills, diaphoresis, fatigue and fever.  HENT:  Negative for congestion and hearing loss.   Eyes:  Negative for visual disturbance.  Respiratory:  Negative for cough, chest tightness, shortness of breath and wheezing.   Cardiovascular:  Negative for chest pain, palpitations and leg swelling.  Gastrointestinal:  Negative for abdominal pain, constipation, diarrhea, nausea and vomiting.  Genitourinary:  Negative for dysuria, frequency and hematuria.  Musculoskeletal:  Negative for arthralgias and neck pain.  Skin:  Negative for rash.  Neurological:  Negative for dizziness, weakness, light-headedness, numbness and headaches.  Hematological:  Negative for adenopathy.  Psychiatric/Behavioral:  Negative for behavioral problems, dysphoric mood and sleep disturbance.   Per HPI unless specifically indicated above      Objective:    BP (!) 120/50 (BP Location: Left Arm, Patient Position: Sitting, Cuff Size: Normal)    Pulse 70    Temp 97.9 F (36.6 C) (Temporal)    Resp 18    Ht 5' 2"  (1.575 m)    Wt 111 lb (50.3 kg)    SpO2 100%    BMI 20.30 kg/m   Wt Readings from Last 3 Encounters:  02/21/21 111 lb (50.3 kg)  02/21/21 111 lb (50.3 kg)  02/10/21 110 lb (49.9 kg)    Physical Exam Vitals and nursing note reviewed.  Constitutional:      General: She is not in acute distress.    Appearance: She is well-developed. She is not diaphoretic.     Comments: Well-appearing, comfortable, cooperative  HENT:     Head: Normocephalic and atraumatic.  Eyes:     General:        Right eye: No discharge.        Left eye: No discharge.     Conjunctiva/sclera: Conjunctivae normal.     Pupils: Pupils are equal, round, and reactive to light.  Neck:     Thyroid: No thyromegaly.  Cardiovascular:     Rate and Rhythm:  Normal rate and regular rhythm.     Pulses: Normal pulses.  Heart sounds: Normal heart sounds. No murmur heard. Pulmonary:     Effort: Pulmonary effort is normal. No respiratory distress.     Breath sounds: Normal breath sounds. No wheezing or rales.  Abdominal:     General: Bowel sounds are normal. There is no distension.     Palpations: Abdomen is soft. There is no mass.     Tenderness: There is no abdominal tenderness.  Musculoskeletal:        General: No tenderness. Normal range of motion.     Cervical back: Normal range of motion and neck supple.     Comments: Upper / Lower Extremities: - Normal muscle tone, strength bilateral upper extremities 5/5, lower extremities 5/5  Lymphadenopathy:     Cervical: No cervical adenopathy.  Skin:    General: Skin is warm and dry.     Findings: No erythema or rash.  Neurological:     Mental Status: She is alert and oriented to person, place, and time.     Comments: Distal sensation intact to light touch all extremities  Psychiatric:        Mood and Affect: Mood normal.        Behavior: Behavior normal.        Thought Content: Thought content normal.     Comments: Well groomed, good eye contact, normal speech and thoughts     Dexa Bone Density Skeletal  Impression  Lumbar spine:  Normal bone density.  The measurement has increased significantly since prior study.  Left proximal femur: The total femoral density is normal, but the femoral neck density indicates osteopenia.  The measurement has decreased significantly since prior study.      Narrative  EXAM: QDR BODY NON-EXTREMITIES  DATE: 02/04/2016 11:20 AM  ACCESSION: [id number]  DICTATED: 02/04/2016 3:20 PM  INTERPRETATION LOCATION: [facility]   CLINICAL INDICATION: 76 years old Female with postmenopausal estrogen deficiency   TECHNIQUE: Dual energy x-ray absorptiometry was performed assessing the bone mineral density in the lumbar spine and proximal left femur using  a Hologic 4500 C densitometer.  The examination is compared with previous measurements dated 02/17/2013   FINDINGS: The bone mineral density in the spine measuring L1 to 4 measures 1.036 gm/cm2.  The  Z score is 2.0 and the T score is -0.1.  This value is above the fracture risk threshold.  This represents a significant increase of 3.9% when compared with a previous measurement of 0.998 gm/cm2.   The total bone mineral density in the proximal left femur measures 0.849 gm/cm2.  The Z score is 0.7 and the T score is -0.8.  This represents a significant decrease of 7.1% when compared with the previous measurement of 0.914 gm/cm2.  The femoral neck density is 0.655 gm/cm2, and the femoral neck T score is -1.8.  The other T scores range from 0 to -0.6.  These values are above the fracture risk thresholds. Other Result Text  Interface, Rad Results In - 02/04/2016  3:22 PM EST  EXAM: QDR BODY NON-EXTREMITIES  DATE: 02/04/2016 11:20 AM  ACCESSION: [id number]  DICTATED: 02/04/2016 3:20 PM  INTERPRETATION LOCATION: [facility]   CLINICAL INDICATION: 76 years old Female with postmenopausal estrogen deficiency   TECHNIQUE: Dual energy x-ray absorptiometry was performed assessing the bone mineral density in the lumbar spine and proximal left femur using a Hologic 4500 C densitometer.  The examination is compared with previous measurements dated 02/17/2013   FINDINGS: The bone mineral density in the spine measuring L1 to 4  measures 1.036 gm/cm2.  The  Z score is 2.0 and the T score is -0.1.  This value is above the fracture risk threshold.  This represents a significant increase of 3.9% when compared with a previous measurement of 0.998 gm/cm2.   The total bone mineral density in the proximal left femur measures 0.849 gm/cm2.  The Z score is 0.7 and the T score is -0.8.  This represents a significant decrease of 7.1% when compared with the previous measurement of 0.914 gm/cm2.  The femoral neck density is  0.655 gm/cm2, and the femoral neck T score is -1.8.  The other T scores range from 0 to -0.6.  These values are above the fracture risk thresholds.    IMPRESSION:  Lumbar spine:  Normal bone density.  The measurement has increased significantly since prior study.  Left proximal femur: The total femoral density is normal, but the femoral neck density indicates osteopenia.  The measurement has decreased significantly since prior study. Specimen Collected: -- Last Resulted: --  Date: 02/04/16   Received From: [facility]     Results for orders placed or performed in visit on 02/14/21  TSH  Result Value Ref Range   TSH 2.70 0.40 - 4.50 mIU/L  Hepatitis C antibody  Result Value Ref Range   Hepatitis C Ab NON-REACTIVE NON-REACTIVE   SIGNAL TO CUT-OFF 0.03 <1.00  Lipid panel  Result Value Ref Range   Cholesterol 181 <200 mg/dL   HDL 80 > OR = 50 mg/dL   Triglycerides 160 (H) <150 mg/dL   LDL Cholesterol (Calc) 75 mg/dL (calc)   Total CHOL/HDL Ratio 2.3 <5.0 (calc)   Non-HDL Cholesterol (Calc) 101 <130 mg/dL (calc)  COMPLETE METABOLIC PANEL WITH GFR  Result Value Ref Range   Glucose, Bld 102 (H) 65 - 99 mg/dL   BUN 18 7 - 25 mg/dL   Creat 0.75 0.60 - 1.00 mg/dL   eGFR 83 > OR = 60 mL/min/1.13m   BUN/Creatinine Ratio NOT APPLICABLE 6 - 22 (calc)   Sodium 143 135 - 146 mmol/L   Potassium 4.6 3.5 - 5.3 mmol/L   Chloride 106 98 - 110 mmol/L   CO2 28 20 - 32 mmol/L   Calcium 9.9 8.6 - 10.4 mg/dL   Total Protein 6.8 6.1 - 8.1 g/dL   Albumin 4.5 3.6 - 5.1 g/dL   Globulin 2.3 1.9 - 3.7 g/dL (calc)   AG Ratio 2.0 1.0 - 2.5 (calc)   Total Bilirubin 0.3 0.2 - 1.2 mg/dL   Alkaline phosphatase (APISO) 52 37 - 153 U/L   AST 15 10 - 35 U/L   ALT 19 6 - 29 U/L  CBC with Differential/Platelet  Result Value Ref Range   WBC 6.0 3.8 - 10.8 Thousand/uL   RBC 4.52 3.80 - 5.10 Million/uL   Hemoglobin 13.9 11.7 - 15.5 g/dL   HCT 42.8 35.0 - 45.0 %   MCV 94.7 80.0 - 100.0 fL   MCH 30.8 27.0  - 33.0 pg   MCHC 32.5 32.0 - 36.0 g/dL   RDW 12.0 11.0 - 15.0 %   Platelets 256 140 - 400 Thousand/uL   MPV 10.9 7.5 - 12.5 fL   Neutro Abs 3,954 1,500 - 7,800 cells/uL   Lymphs Abs 1,248 850 - 3,900 cells/uL   Absolute Monocytes 492 200 - 950 cells/uL   Eosinophils Absolute 258 15 - 500 cells/uL   Basophils Absolute 48 0 - 200 cells/uL   Neutrophils Relative % 65.9 %  Total Lymphocyte 20.8 %   Monocytes Relative 8.2 %   Eosinophils Relative 4.3 %   Basophils Relative 0.8 %  Hemoglobin A1c  Result Value Ref Range   Hgb A1c MFr Bld 5.3 <5.7 % of total Hgb   Mean Plasma Glucose 105 mg/dL   eAG (mmol/L) 5.8 mmol/L      Assessment & Plan:   Problem List Items Addressed This Visit     Recurrent major depression in complete remission ([place])    Controlled depression, recurrent now in complete remission Dramatic improvement in past 20 years, has been controlled.  Continue meds On SNRI Effexor On Trazodone       Relevant Medications   traZODone (DESYREL) 50 MG tablet   Mixed hyperlipidemia    Controlled on Statin Continue Rosuvastatin      Relevant Medications   rosuvastatin (CRESTOR) 10 MG tablet   Allergic rhinitis due to pollen   Relevant Medications   fluticasone (FLONASE) 50 MCG/ACT nasal spray   Other Visit Diagnoses     Annual physical exam    -  Primary   Encounter for screening mammogram for malignant neoplasm of breast       Relevant Orders   MM 3D SCREEN BREAST BILATERAL   Postmenopausal estrogen deficiency       Relevant Orders   DG Bone Density   Renal artery stenosis (HCC)   (Chronic)     Relevant Medications   rosuvastatin (CRESTOR) 10 MG tablet       Updated Health Maintenance information Reviewed recent lab results with patient Encouraged improvement to lifestyle with diet and exercise Goal maintain weight  Orders Placed This Encounter  Procedures   MM 3D SCREEN BREAST BILATERAL    Standing Status:   Future    Standing Expiration Date:    02/21/2022    Order Specific Question:   Reason for Exam (SYMPTOM  OR DIAGNOSIS REQUIRED)    Answer:   Screening bilateral 3D Mammogram [place]    Order Specific Question:   Preferred imaging location?    Answer:   [facility]   DG Bone Density    Standing Status:   Future    Standing Expiration Date:   02/21/2022    Order Specific Question:   Reason for Exam (SYMPTOM  OR DIAGNOSIS REQUIRED)    Answer:   post menopausal estrogen deficiency    Order Specific Question:   Preferred imaging location?    Answer:   [place] ordered this encounter  Medications   fluticasone (FLONASE) 50 MCG/ACT nasal spray    Sig: Place 2 sprays into both nostrils daily.    Dispense:  48 mL    Refill:  3   rosuvastatin (CRESTOR) 10 MG tablet    Sig: Take 1 tablet (10 mg total) by mouth at bedtime.    Dispense:  90 tablet    Refill:  3    Add refills      Follow up plan: Return in about 1 year (around 02/21/2022) for 1 year fasting lab only then 1 week later Annual Physical.  Nobie Putnam, DO [facility] 02/21/2021, 8:42 AM

## 2021-02-21 NOTE — Assessment & Plan Note (Signed)
Controlled on Statin Continue Rosuvastatin

## 2021-02-21 NOTE — Assessment & Plan Note (Signed)
Controlled depression, recurrent now in complete remission Dramatic improvement in past 20 years, has been controlled.  Continue meds On SNRI Effexor On Trazodone

## 2021-02-21 NOTE — Patient Instructions (Addendum)
Thank you for coming to the office today.  For Mammogram screening for breast cancer   DEXA Scan (Bone mineral density) screening for osteoporosis  Call the [facility] below anytime to schedule your own appointment now that order has been placed.  [facility] [place], [place] [address] Phone: [phone]   DUE for FASTING BLOOD WORK (no food or drink after midnight before the lab appointment, only water or coffee without cream/sugar on the morning of)  SCHEDULE "Lab Only" visit in the morning at the clinic for lab draw in 1 YEAR  - Make sure Lab Only appointment is at about 1 week before your next appointment, so that results will be available  For Lab Results, once available within 2-3 days of blood draw, you can can log in to MyChart online to view your results and a brief explanation. Also, we can discuss results at next follow-up visit.   Please schedule a Follow-up Appointment to: Return in about 1 year (around 02/21/2022) for 1 year fasting lab only then 1 week later Annual Physical.  If you have any other questions or concerns, please feel free to call the office or send a message through [facility]. You may also schedule an earlier appointment if necessary.  Additionally, you may be receiving a survey about your experience at our office within a few days to 1 week by e-mail or mail. We value your feedback.  Nobie Putnam, DO [place]

## 2021-02-22 ENCOUNTER — Inpatient Hospital Stay
Admission: RE | Admit: 2021-02-22 | Discharge: 2021-02-22 | Disposition: A | Discharge status: Home / Self Care | Payer: Self-pay | Source: Ambulatory Visit | Attending: *Deleted | Admitting: *Deleted

## 2021-02-22 ENCOUNTER — Other Ambulatory Visit: Payer: Self-pay | Admitting: *Deleted

## 2021-02-22 DIAGNOSIS — Z1231 Encounter for screening mammogram for malignant neoplasm of breast: Secondary | ICD-10-CM

## 2021-02-24 NOTE — Addendum Note (Signed)
Addended by: Wilson Singer on: 02/24/2021 04:09 PM   Modules accepted: Level of Service

## 2021-02-25 DIAGNOSIS — M65312 Trigger thumb, left thumb: Secondary | ICD-10-CM | POA: Diagnosis not present

## 2021-03-05 ENCOUNTER — Other Ambulatory Visit: Payer: Self-pay | Admitting: Family Medicine

## 2021-03-05 DIAGNOSIS — R519 Headache, unspecified: Secondary | ICD-10-CM

## 2021-03-07 ENCOUNTER — Other Ambulatory Visit: Payer: Self-pay | Admitting: Family Medicine

## 2021-03-07 DIAGNOSIS — F5101 Primary insomnia: Secondary | ICD-10-CM

## 2021-03-07 NOTE — Telephone Encounter (Signed)
Requested Prescriptions  ?Pending Prescriptions Disp Refills  ?? ibuprofen (ADVIL) 800 MG tablet [Pharmacy Med Name: Ibuprofen 800 MG Oral Tablet] 90 tablet 0  ?  Sig: TAKE 1 TABLET BY MOUTH EVERY 8 HOURS AS NEEDED FOR HEADACHE  ?  ? Analgesics:  NSAIDS Failed - 03/05/2021  9:15 AM  ?  ?  Failed - Manual Review: Labs are only required if the patient has taken medication for more than 8 weeks.  ?  ?  Passed - Cr in normal range and within 360 days  ?  Creat  ?Date Value Ref Range Status  ?02/14/2021 0.75 0.60 - 1.00 mg/dL Final  ?   ?  ?  Passed - HGB in normal range and within 360 days  ?  Hemoglobin  ?Date Value Ref Range Status  ?02/14/2021 13.9 11.7 - 15.5 g/dL Final  ?   ?  ?  Passed - PLT in normal range and within 360 days  ?  Platelets  ?Date Value Ref Range Status  ?02/14/2021 256 140 - 400 Thousand/uL Final  ?   ?  ?  Passed - HCT in normal range and within 360 days  ?  HCT  ?Date Value Ref Range Status  ?02/14/2021 42.8 35.0 - 45.0 % Final  ?   ?  ?  Passed - eGFR is 30 or above and within 360 days  ?  GFR calc Af Amer  ?Date Value Ref Range Status  ?01/12/2020 90 >59 mL/min/1.73 Final  ?  Comment:  ?  **In accordance with recommendations from the NKF-ASN Task force,** ?  [facility] is in the process of updating its eGFR calculation to the ?  2021 CKD-EPI creatinine equation that estimates kidney function ?  without a race variable. ?  ? ?GFR, Estimated  ?Date Value Ref Range Status  ?03/09/2020 >60 >60 mL/min Final  ?  Comment:  ?  (NOTE) ?Calculated using the CKD-EPI Creatinine Equation (2021) ?Performed at [facility], [place], ?[place] [address] ?  ? ?eGFR  ?Date Value Ref Range Status  ?02/14/2021 83 > OR = 60 mL/min/1.14m Final  ?  Comment:  ?  The eGFR is based on the CKD-EPI 2021 equation. To calculate  ?the new eGFR from a previous Creatinine or Cystatin C ?result, go to https://www.kidney.org/professionals/ ?kdoqi/gfr%5Fcalculator ?  ?   ?  ?  Passed - Patient is not  pregnant  ?  ?  Passed - Valid encounter within last 12 months  ?  Recent Outpatient Visits   ?      ? 2 weeks ago Annual physical exam  ? [place] DO  ? 3 weeks ago Acute non-recurrent frontal sinusitis  ? [place] DO  ? 4 months ago Primary insomnia  ? [place] DO  ? 6 months ago Intractable acute post-traumatic headache  ? [place] DO  ? 8 months ago Vertigo  ? [place] DO  ?  ?  ?Future Appointments   ?        ? In 6 months BHollice Espy MD [place] ? In 11 months KParks Ranger ADevonne Doughty DO [facility] [place] ?  ? ?  ?  ?  ? ?

## 2021-03-08 NOTE — Telephone Encounter (Signed)
Requested medications are due for refill today.  unsure ? ?Requested medications are on the active medications list.  yes ? ?Last refill. 02/21/2021 ? ?Future visit scheduled.   yes ? ?Notes to clinic.  Historical medication / provider. ? ? ? ?Requested Prescriptions  ?Pending Prescriptions Disp Refills  ? traZODone (DESYREL) 50 MG tablet [Pharmacy Med Name: traZODone HCl 50 MG Oral Tablet] 90 tablet 0  ?  Sig: TAKE 1 TABLET BY MOUTH AT BEDTIME  ?  ? Psychiatry: Antidepressants - Serotonin Modulator Passed - 03/07/2021  4:50 AM  ?  ?  Passed - Completed PHQ-2 or PHQ-9 in the last 360 days  ?  ?  Passed - Valid encounter within last 6 months  ?  Recent Outpatient Visits   ? ?      ? 2 weeks ago Annual physical exam  ? [place], DO  ? 3 weeks ago Acute non-recurrent frontal sinusitis  ? [place], DO  ? 4 months ago Primary insomnia  ? [place], DO  ? 6 months ago Intractable acute post-traumatic headache  ? [place], DO  ? 8 months ago Vertigo  ? [place], DO  ? ?  ?  ?Future Appointments   ? ?        ? In 6 months Hollice Espy, MD [place]  ? In 11 months [place] Ranger, Devonne Doughty, DO [facility] [place]  ? ?  ? ?  ?  ?  ?  ?

## 2021-03-18 ENCOUNTER — Other Ambulatory Visit: Payer: Self-pay

## 2021-03-18 MED ORDER — ESOMEPRAZOLE MAGNESIUM 40 MG PO CPDR: 40.0000 mg | DELAYED_RELEASE_CAPSULE | Freq: Two times a day (BID) | ORAL | 3 refills | Status: DC

## 2021-03-20 DIAGNOSIS — I701 Atherosclerosis of renal artery: Secondary | ICD-10-CM | POA: Insufficient documentation

## 2021-03-20 NOTE — Progress Notes (Signed)
? ? ?MRN : [id number] ? ?Amber Brooks is a 76 y.o. (08-12-1945) female who presents with chief complaint of check blood flow to the kidney. ? ?History of Present Illness:  ? 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 There have been no interval changes in the patient's blood pressure control.  She denies any major changes in her medications.  The patient denies headache or flushing.  No flank or unusual back pain.   ?  ?There have been no significant changes to the patient's overall health care.  She has a known history of DJDat the L4-L5 level and is asking for a neurosurgeon. ?  ?No interval shortening of the patient's walking distance or new symptoms consistent with claudication.  The patient denies the  development of rest pain symptoms. No new ulcers or wounds have occurred since the last visit. ?  ?The patient denies amaurosis fugax or recent TIA symptoms. There are no recent neurological changes noted. ?The patient denies history of DVT, PE or superficial thrombophlebitis. ?The patient denies recent episodes of angina or shortness of breath.  ?  ?Previous renal artery angiogram showed mild ASO of the right renal artery. ? ?Renal artery duplex shows <50% bilateral stenosis ? ?No outpatient medications have been marked as taking for the 03/21/21 encounter (Appointment) with Delana Meyer, Dolores Lory, MD.  ? ? ?Past Medical History:  ?Diagnosis Date  ? Anxiety   ? 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  ? Brain aneurysm   ? Complication of anesthesia   ? Depression   ? GERD (gastroesophageal reflux disease)   ? Headache(784.0)   ? migraines  ? History of chicken pox   ? Hyperlipidemia   ? Hypertension   ? Mitral valve prolapse   ? PONV (postoperative nausea and vomiting)   ? Shingles   ? ? ?Past Surgical History:  ?Procedure Laterality Date  ? ABDOMINAL HYSTERECTOMY  1987  ? ANEURYSM COILING    ? CHOLECYSTECTOMY  2018   ? COLONOSCOPY N/A 09/27/2020  ? Procedure: COLONOSCOPY;  Surgeon: Annamaria Helling, DO;  Location: [facility] ENDOSCOPY;  Service: Gastroenterology;  Laterality: N/A;  ? CRANIOTOMY Left 11/15/2012  ? Procedure: CRANIOTOMY INTRACRANIAL ANEURYSM FOR CAROTID;  Surgeon: Winfield Cunas, MD;  Location: [place] NEURO ORS;  Service: Neurosurgery;  Laterality: Left;  LEFT Craniotomy for Aneurysm  ? ECTOPIC PREGNANCY SURGERY    ? ESOPHAGOGASTRODUODENOSCOPY N/A 09/27/2020  ? Procedure: ESOPHAGOGASTRODUODENOSCOPY (EGD);  Surgeon: Annamaria Helling, DO;  Location: [facility] ENDOSCOPY;  Service: Gastroenterology;  Laterality: N/A;  ? KNEE ARTHROSCOPY    ? LUMBAR LAMINECTOMY/DECOMPRESSION MICRODISCECTOMY N/A 04/27/2014  ? Procedure: LUMBAR LAMINECTOMY/DECOMPRESSION MICRODISCECTOMY LUMBAR FOUR-FIVE ;  Surgeon: Kristeen Miss, MD;  Location: [place] NEURO ORS;  Service: Neurosurgery;  Laterality: N/A;  ? RADIOLOGY WITH ANESTHESIA N/A 10/09/2012  ? Procedure: RADIOLOGY WITH ANESTHESIA;  Surgeon: Rob Hickman, MD;  Location: [place];  Service: Radiology;  Laterality: N/A;  ? RENAL ANGIOGRAPHY N/A 03/09/2020  ? Procedure: RENAL ANGIOGRAPHY;  Surgeon: Katha Cabal, MD;  Location: [place] CV LAB;  Service: Cardiovascular;  Laterality: N/A;  ? TONSILLECTOMY    ? VASCULAR SURGERY    ? stent placed  ? ? ?Social History ?Social History  ? ?Tobacco Use  ? Smoking status: Never  ? Smokeless tobacco: Never  ?Vaping Use  ? Vaping Use: Never used  ?Substance Use Topics  ? Alcohol use: Yes  ?  Alcohol/week: 1.0 standard drink  ?  Types: 1 Glasses of wine per week  ?  Comment: every evening  ? Drug use: No  ? ? ?Family History ?Family History  ?Problem Relation Age of Onset  ? Stroke Father   ? Heart disease Father   ? Dementia Father   ? Dementia Brother   ? ? ?Allergies  ?Allergen Reactions  ? Latex Anaphylaxis  ? Hydromorphone Itching  ? ? ? ?REVIEW OF SYSTEMS (Negative unless checked) ? ?Constitutional: '[]'$ Weight loss  '[]'$ Fever   '[]'$ Chills ?Cardiac: '[]'$ Chest pain   '[]'$ Chest pressure   '[]'$ Palpitations   '[]'$ Shortness of breath when laying flat   '[]'$ Shortness of breath with exertion. ?Vascular:  '[]'$ Pain in legs with walking   '[]'$ Pain in legs at rest  '[]'$ History of DVT   '[]'$ Phlebitis   '[]'$ Swelling in legs   '[]'$ Varicose veins   '[]'$ Non-healing ulcers ?Pulmonary:   '[]'$ Uses home oxygen   '[]'$ Productive cough   '[]'$ Hemoptysis   '[]'$ Wheeze  '[]'$ COPD   '[]'$ Asthma ?Neurologic:  '[]'$ Dizziness   '[]'$ Seizures   '[]'$ History of stroke   '[]'$ History of TIA  '[]'$ Aphasia   '[]'$ Vissual changes   '[]'$ Weakness or numbness in arm   '[]'$ Weakness or numbness in leg ?Musculoskeletal:   '[]'$ Joint swelling   '[]'$ Joint pain   '[x]'$ Low back pain ?Hematologic:  '[]'$ Easy bruising  '[]'$ Easy bleeding   '[]'$ Hypercoagulable state   '[]'$ Anemic ?Gastrointestinal:  '[]'$ Diarrhea   '[]'$ Vomiting  '[x]'$ Gastroesophageal reflux/heartburn   '[]'$ Difficulty swallowing. ?Genitourinary:  '[]'$ Chronic kidney disease   '[]'$ Difficult urination  '[]'$ Frequent urination   '[]'$ Blood in urine ?Skin:  '[]'$ Rashes   '[]'$ Ulcers  ?Psychological:  '[]'$ History of anxiety   '[]'$  History of major depression. ? ?Physical Examination ? ?There were no vitals filed for this visit. ?There is no height or weight on file to calculate BMI. ?Gen: WD/WN, NAD ?Head: [place]/AT, No temporalis wasting.  ?Ear/Nose/Throat: Hearing grossly intact, nares w/o erythema or drainage ?Eyes: PER, EOMI, sclera nonicteric.  ?Neck: Supple, no masses.  No bruit or JVD.  ?Pulmonary:  Good air movement, no audible wheezing, no use of accessory muscles.  ?Cardiac: RRR, normal S1, S2, no Murmurs. ?Vascular:   ?Vessel Right Left  ?Radial Palpable Palpable  ?Gastrointestinal: soft, non-distended. No guarding/no peritoneal signs.  ?Musculoskeletal: M/S 5/5 throughout.  No visible deformity.  ?Neurologic: CN 2-12 intact. Pain and light touch intact in extremities.  Symmetrical.  Speech is fluent. Motor exam as listed above. ?Psychiatric: Judgment intact, Mood & affect appropriate for pt's clinical situation. ?Dermatologic:  No rashes or ulcers noted.  No changes consistent with cellulitis. ? ? ?CBC ?Lab Results  ?Component Value Date  ? WBC 6.0 02/14/2021  ? HGB 13.9 02/14/2021  ? HCT 42.8 02/14/2021  ? MCV 94.7 02/14/2021  ? PLT 256 02/14/2021  ? ? ?BMET ?   ?Component Value Date/Time  ? NA 143 02/14/2021 0811  ? NA 144 01/12/2020 1438  ? K 4.6 02/14/2021 0811  ? CL 106 02/14/2021 0811  ? CO2 28 02/14/2021 0811  ? GLUCOSE 102 (H) 02/14/2021 9381  ? BUN 18 02/14/2021 0811  ? BUN 15 01/12/2020 1438  ? CREATININE 0.75 02/14/2021 0811  ? CALCIUM 9.9 02/14/2021 0811  ? GFRNONAA >60 03/09/2020 1351  ? GFRAA 90 01/12/2020 1438  ? ?CrCl cannot be calculated (Patient's most recent lab result is older than the maximum 21 days allowed.). ? ?COAG ?Lab Results  ?Component Value Date  ? INR 1.08 04/06/2014  ? INR 0.96 10/04/2012  ? INR 0.99 03/17/2010  ? ? ?  Radiology ?MM Outside Films Mammo ? ?Result Date: 02/22/2021 ?This examination belongs to an outside facility and is stored here for comparison purposes only.  Contact the originating outside institution for any associated report or interpretation. ? ?MM Outside Films Mammo ? ?Result Date: 02/22/2021 ?This examination belongs to an outside facility and is stored here for comparison purposes only.  Contact the originating outside institution for any associated report or interpretation. ? ?MM Outside Films Mammo ? ?Result Date: 02/22/2021 ?This examination belongs to an outside facility and is stored here for comparison purposes only.  Contact the originating outside institution for any associated report or interpretation. ? ?MM Outside Films Mammo ? ?Result Date: 02/22/2021 ?This examination belongs to an outside facility and is stored here for comparison purposes only.  Contact the originating outside institution for any associated report or interpretation.  ? ? ?Assessment/Plan ?1. Atherosclerosis of renal artery ([place]) ?Given patient's arterial disease optimal control of the patient's hypertension  is important. ?BP is acceptable today ? ?The patient's vital signs and noninvasive studies support the renal artery stenosis is not significantly increased when compared to the previous study. ? ?No invasive studies o

## 2021-03-21 ENCOUNTER — Ambulatory Visit (INDEPENDENT_AMBULATORY_CARE_PROVIDER_SITE_OTHER): Payer: PPO

## 2021-03-21 ENCOUNTER — Other Ambulatory Visit: Payer: Self-pay

## 2021-03-21 ENCOUNTER — Ambulatory Visit (INDEPENDENT_AMBULATORY_CARE_PROVIDER_SITE_OTHER): Payer: PPO | Admitting: Vascular Surgery

## 2021-03-21 ENCOUNTER — Encounter (INDEPENDENT_AMBULATORY_CARE_PROVIDER_SITE_OTHER): Payer: Self-pay | Admitting: Vascular Surgery

## 2021-03-21 VITALS — BP 165/72 | HR 72 | Resp 16 | Ht 62.0 in | Wt 112.0 lb

## 2021-03-21 DIAGNOSIS — I10 Essential (primary) hypertension: Secondary | ICD-10-CM | POA: Diagnosis not present

## 2021-03-21 DIAGNOSIS — K219 Gastro-esophageal reflux disease without esophagitis: Secondary | ICD-10-CM

## 2021-03-21 DIAGNOSIS — I701 Atherosclerosis of renal artery: Secondary | ICD-10-CM | POA: Diagnosis not present

## 2021-03-21 DIAGNOSIS — E782 Mixed hyperlipidemia: Secondary | ICD-10-CM | POA: Diagnosis not present

## 2021-03-26 ENCOUNTER — Other Ambulatory Visit: Payer: Self-pay | Admitting: Family Medicine

## 2021-03-26 DIAGNOSIS — I110 Hypertensive heart disease with heart failure: Secondary | ICD-10-CM

## 2021-03-28 NOTE — Telephone Encounter (Signed)
Requested medication (s) are due for refill today: Yes ? ?Requested medication (s) are on the active medication list: Yes ? ?Last refill:  10/2020 ? ?Future visit scheduled: No ? ?Notes to clinic:  Medication discontinued by another provider on 03/21/21 OV, routing to PCP for approval or denial ? ? ? ?Requested Prescriptions  ?Pending Prescriptions Disp Refills  ? carvedilol (COREG) 25 MG tablet [Pharmacy Med Name: Carvedilol 25 MG Oral Tablet] 60 tablet 0  ?  Sig: Take 1 tablet by mouth twice daily  ?  ? Cardiovascular: Beta Blockers 3 Failed - 03/26/2021  8:38 AM  ?  ?  Failed - Last BP in normal range  ?  BP Readings from Last 1 Encounters:  ?03/21/21 (!) 165/72  ?  ?  ?  ?  Passed - Cr in normal range and within 360 days  ?  Creat  ?Date Value Ref Range Status  ?02/14/2021 0.75 0.60 - 1.00 mg/dL Final  ?  ?  ?  ?  Passed - AST in normal range and within 360 days  ?  AST  ?Date Value Ref Range Status  ?02/14/2021 15 10 - 35 U/L Final  ?  ?  ?  ?  Passed - ALT in normal range and within 360 days  ?  ALT  ?Date Value Ref Range Status  ?02/14/2021 19 6 - 29 U/L Final  ?  ?  ?  ?  Passed - Last Heart Rate in normal range  ?  Pulse Readings from Last 1 Encounters:  ?03/21/21 72  ?  ?  ?  ?  Passed - Valid encounter within last 6 months  ?  Recent Outpatient Visits   ? ?      ? 1 month ago Annual physical exam  ? [place], DO  ? 1 month ago Acute non-recurrent frontal sinusitis  ? [place], DO  ? 4 months ago Primary insomnia  ? [place], DO  ? 7 months ago Intractable acute post-traumatic headache  ? [place], DO  ? 9 months ago Vertigo  ? [place], DO  ? ?  ?  ?Future Appointments   ? ?        ? In 6 months Hollice Espy, MD [place]  ? In 11 months Parks Ranger, Devonne Doughty, DO  [facility], [place]  ? ?  ? ?  ?  ?  ? ? ? ? ?

## 2021-04-04 ENCOUNTER — Encounter (INDEPENDENT_AMBULATORY_CARE_PROVIDER_SITE_OTHER): Payer: Self-pay | Admitting: Vascular Surgery

## 2021-04-12 ENCOUNTER — Ambulatory Visit
Admission: RE | Admit: 2021-04-12 | Discharge: 2021-04-12 | Disposition: A | Discharge status: Home / Self Care | Payer: PPO | Source: Ambulatory Visit | Attending: Family Medicine | Admitting: Family Medicine

## 2021-04-12 DIAGNOSIS — Z1231 Encounter for screening mammogram for malignant neoplasm of breast: Secondary | ICD-10-CM | POA: Insufficient documentation

## 2021-04-12 DIAGNOSIS — R0981 Nasal congestion: Secondary | ICD-10-CM | POA: Diagnosis not present

## 2021-04-12 DIAGNOSIS — H16223 Keratoconjunctivitis sicca, not specified as Sjogren's, bilateral: Secondary | ICD-10-CM | POA: Diagnosis not present

## 2021-04-12 DIAGNOSIS — J329 Chronic sinusitis, unspecified: Secondary | ICD-10-CM | POA: Diagnosis not present

## 2021-04-12 DIAGNOSIS — Z780 Asymptomatic menopausal state: Secondary | ICD-10-CM | POA: Insufficient documentation

## 2021-04-12 DIAGNOSIS — J309 Allergic rhinitis, unspecified: Secondary | ICD-10-CM | POA: Diagnosis not present

## 2021-04-12 DIAGNOSIS — M85851 Other specified disorders of bone density and structure, right thigh: Secondary | ICD-10-CM | POA: Diagnosis not present

## 2021-04-23 ENCOUNTER — Other Ambulatory Visit: Payer: Self-pay | Admitting: Family Medicine

## 2021-04-23 DIAGNOSIS — F3342 Major depressive disorder, recurrent, in full remission: Secondary | ICD-10-CM

## 2021-04-25 NOTE — Telephone Encounter (Signed)
Requested Prescriptions  ?Pending Prescriptions Disp Refills  ?? venlafaxine XR (EFFEXOR-XR) 150 MG 24 hr capsule [Pharmacy Med Name: Venlafaxine HCl ER 150 MG Oral Capsule Extended Release 24 Hour] 90 capsule 0  ?  Sig: TAKE 1 CAPSULE BY MOUTH ONCE DAILY WITH BREAKFAST  ?  ? Psychiatry: Antidepressants - SNRI - desvenlafaxine & venlafaxine Failed - 04/23/2021  8:36 AM  ?  ?  Failed - Last BP in normal range  ?  BP Readings from Last 1 Encounters:  ?03/21/21 (!) 165/72  ?   ?  ?  Failed - Lipid Panel in normal range within the last 12 months  ?  Cholesterol  ?Date Value Ref Range Status  ?02/14/2021 181 <200 mg/dL Final  ? ?LDL Cholesterol (Calc)  ?Date Value Ref Range Status  ?02/14/2021 75 mg/dL (calc) Final  ?  Comment:  ?  Reference range: <100 ?Marland Kitchen ?Desirable range <100 mg/dL for primary prevention;   ?<70 mg/dL for patients with CHD or diabetic patients  ?with > or = 2 CHD risk factors. ?. ?LDL-C is now calculated using the Martin-Hopkins  ?calculation, which is a validated novel method providing  ?better accuracy than the Friedewald equation in the  ?estimation of LDL-C.  ?Cresenciano Genre et al. Annamaria Helling. 4765;465(03): 2061-2068  ?(http://education.[employer].com/faq/FAQ164) ?  ? ?HDL  ?Date Value Ref Range Status  ?02/14/2021 80 > OR = 50 mg/dL Final  ? ?Triglycerides  ?Date Value Ref Range Status  ?02/14/2021 160 (H) <150 mg/dL Final  ? ?  ?  ?  Passed - Cr in normal range and within 360 days  ?  Creat  ?Date Value Ref Range Status  ?02/14/2021 0.75 0.60 - 1.00 mg/dL Final  ?   ?  ?  Passed - Completed PHQ-2 or PHQ-9 in the last 360 days  ?  ?  Passed - Valid encounter within last 6 months  ?  Recent Outpatient Visits   ?      ? 2 months ago Annual physical exam  ? [place], DO  ? 2 months ago Acute non-recurrent frontal sinusitis  ? [place], DO  ? 5 months ago Primary insomnia  ? [place], DO  ? 8 months ago Intractable acute post-traumatic headache  ? [place], DO  ? 10 months ago Vertigo  ? [place], DO  ?  ?  ?Future Appointments   ?        ? In 5 months Hollice Espy, MD [place]  ? In 10 months Parks Ranger, Devonne Doughty, DO [facility], [place]  ?  ? ?  ?  ?  ? ?

## 2021-06-14 ENCOUNTER — Other Ambulatory Visit: Payer: Self-pay | Admitting: Family Medicine

## 2021-06-14 DIAGNOSIS — R519 Headache, unspecified: Secondary | ICD-10-CM

## 2021-06-15 NOTE — Telephone Encounter (Signed)
Requested Prescriptions  Pending Prescriptions Disp Refills  . ibuprofen (ADVIL) 800 MG tablet [Pharmacy Med Name: Ibuprofen 800 MG Oral Tablet] 90 tablet 0    Sig: TAKE 1 TABLET BY MOUTH EVERY 8 HOURS AS NEEDED FOR HEADACHE     Analgesics:  NSAIDS Failed - 06/14/2021  6:52 PM      Failed - Manual Review: Labs are only required if the patient has taken medication for more than 8 weeks.      Passed - Cr in normal range and within 360 days    Creat  Date Value Ref Range Status  02/14/2021 0.75 0.60 - 1.00 mg/dL Final         Passed - HGB in normal range and within 360 days    Hemoglobin  Date Value Ref Range Status  02/14/2021 13.9 11.7 - 15.5 g/dL Final         Passed - PLT in normal range and within 360 days    Platelets  Date Value Ref Range Status  02/14/2021 256 140 - 400 Thousand/uL Final         Passed - HCT in normal range and within 360 days    HCT  Date Value Ref Range Status  02/14/2021 42.8 35.0 - 45.0 % Final         Passed - eGFR is 30 or above and within 360 days    GFR calc Af Amer  Date Value Ref Range Status  01/12/2020 90 >59 mL/min/1.73 Final    Comment:    **In accordance with recommendations from the NKF-ASN Task force,**   [facility] is in the process of updating its eGFR calculation to the   2021 CKD-EPI creatinine equation that estimates kidney function   without a race variable.    GFR, Estimated  Date Value Ref Range Status  03/09/2020 >60 >60 mL/min Final    Comment:    (NOTE) Calculated using the CKD-EPI Creatinine Equation (2021) Performed at [facility], [address] [place], [place] [address]    eGFR  Date Value Ref Range Status  02/14/2021 83 > OR = 60 mL/min/1.54m Final    Comment:    The eGFR is based on the CKD-EPI 2021 equation. To calculate  the new eGFR from a previous Creatinine or Cystatin C result, go to https://www.kidney.org/professionals/ kdoqi/gfr%5Fcalculator          Passed - Patient is not  pregnant      Passed - Valid encounter within last 12 months    Recent Outpatient Visits          3 months ago Annual physical exam   [place]   4 months ago [facility]   7 months ago [facility]   10 months ago [facility]   11 months ago [place] DO      Future Appointments            In 3 months [place]ollice Espy MD [place]  In 8 months KParks Ranger [facility]

## 2021-06-24 ENCOUNTER — Other Ambulatory Visit: Payer: Self-pay | Admitting: Family Medicine

## 2021-06-24 DIAGNOSIS — R519 Headache, unspecified: Secondary | ICD-10-CM

## 2021-06-24 DIAGNOSIS — F3342 Major depressive disorder, recurrent, in full remission: Secondary | ICD-10-CM

## 2021-06-28 ENCOUNTER — Encounter: Payer: Self-pay | Admitting: Family Medicine

## 2021-06-29 ENCOUNTER — Ambulatory Visit (INDEPENDENT_AMBULATORY_CARE_PROVIDER_SITE_OTHER): Payer: PPO | Admitting: Family Medicine

## 2021-06-29 ENCOUNTER — Encounter: Payer: Self-pay | Admitting: Family Medicine

## 2021-06-29 VITALS — BP 126/62 | HR 74 | Ht 62.0 in | Wt 112.0 lb

## 2021-06-29 DIAGNOSIS — S0086XA Insect bite (nonvenomous) of other part of head, initial encounter: Secondary | ICD-10-CM

## 2021-06-29 DIAGNOSIS — L299 Pruritus, unspecified: Secondary | ICD-10-CM

## 2021-06-29 DIAGNOSIS — R234 Changes in skin texture: Secondary | ICD-10-CM

## 2021-06-29 DIAGNOSIS — T7840XA Allergy, unspecified, initial encounter: Secondary | ICD-10-CM

## 2021-06-29 DIAGNOSIS — W57XXXA Bitten or stung by nonvenomous insect and other nonvenomous arthropods, initial encounter: Secondary | ICD-10-CM

## 2021-06-29 MED ORDER — TRIAMCINOLONE ACETONIDE 0.5 % EX CREA: 1.0000 | TOPICAL_CREAM | Freq: Two times a day (BID) | CUTANEOUS | 1 refills | Status: DC

## 2021-06-29 MED ORDER — PREDNISONE 10 MG PO TABS: ORAL_TABLET | ORAL | 0 refills | Status: DC

## 2021-06-29 NOTE — Patient Instructions (Addendum)
Thank you for coming to the office today.  Left eye swelling can be from the topical treatment. Recommend ice packs, avoid further exposure  If worsening redness pain eye drainage or sign of infection / fever etc - let me know or get seen asap need to treat with antibiotic.  Other areas on body are swollen with induration from allergic reaction to the bug bites  Use topical triamcinolone on body instead of cortisone, use twice a day for 2 weeks  Ice packs as needed  Take prednisone short term 4 day course only for significant swelling or allergic reaction not improving.   Please schedule a Follow-up Appointment to: No follow-ups on file.  If you have any other questions or concerns, please feel free to call the office or send a message through [place]. You may also schedule an earlier appointment if necessary.  Additionally, you may be receiving a survey about your experience at our office within a few days to 1 week by e-mail or mail. We value your feedback.  Nobie Putnam, DO [place]

## 2021-06-29 NOTE — Progress Notes (Signed)
Subjective:    Patient ID: Amber Brooks, female    DOB: 01/15/1945, 76 y.o.   MRN: [id number]  Amber Brooks is a 76 y.o. female presenting on 06/29/2021 for Insect Bite and Rash   HPI  Allergic Reaction to Bug Bites Reports recently this past weekend outdoors, experienced some mosquito bites, had allergic swelling reaction, suspect likely cause with multiple similar bites with larger indurated swollen spots similar to prior mosquito bites. Also used new product on face and caused some localized L eyelid swelling she believes. No spreading redness Denies fevers drainage ulceration      02/21/2021    8:40 AM 02/21/2021    8:37 AM 02/10/2021   10:12 AM  Depression screen PHQ 2/9  Decreased Interest 0 0 0  Down, Depressed, Hopeless 0 0 0  PHQ - 2 Score 0 0 0  Altered sleeping _0 Tired, decreased energy 0 0 0  Change in appetite 0 0 0  Feeling bad or failure about yourself  0 0 0  Trouble concentrating 0 0 0  Moving slowly or fidgety/restless 0 0 0  Suicidal thoughts 0 0 0  PHQ-9 Score _1 Difficult doing work/chores Not difficult at all Not difficult at all Not difficult at all    Social History   Tobacco Use   Smoking status: Never   Smokeless tobacco: Never  Vaping Use   Vaping Use: Never used  Substance Use Topics   Alcohol use: Yes    Alcohol/week: 1.0 standard drink of alcohol    Types: 1 Glasses of wine per week    Comment: every evening   Drug use: No    Review of Systems Per HPI unless specifically indicated above     Objective:    BP 126/62   Pulse 74   Ht 5' 2" (1.575 m)   Wt 112 lb (50.8 kg)   SpO2 100%   BMI 20.49 kg/m   Wt Readings from Last 3 Encounters:  06/29/21 112 lb (50.8 kg)  03/21/21 112 lb (50.8 kg)  02/21/21 111 lb (50.3 kg)    Physical Exam Vitals and nursing note reviewed.  Constitutional:      General: She is not in acute distress.    Appearance: Normal appearance. She is well-developed. She is not  diaphoretic.     Comments: Well-appearing, comfortable, cooperative  HENT:     Head: Normocephalic and atraumatic.  Eyes:     General:        Right eye: No discharge.        Left eye: No discharge.     Conjunctiva/sclera: Conjunctivae normal.  Cardiovascular:     Rate and Rhythm: Normal rate.  Pulmonary:     Effort: Pulmonary effort is normal.  Skin:    General: Skin is warm and dry.     Findings: Lesion (large indurated area on left forearm consistent with swollen bug bite) present. No erythema or rash.  Neurological:     Mental Status: She is alert and oriented to person, place, and time.  Psychiatric:        Mood and Affect: Mood normal.        Behavior: Behavior normal.        Thought Content: Thought content normal.     Comments: Well groomed, good eye contact, normal speech and thoughts      Results for orders placed or performed in visit on 02/14/21  TSH  Result Value Ref  Range   TSH 2.70 0.40 - 4.50 mIU/L  Hepatitis C antibody  Result Value Ref Range   Hepatitis C Ab NON-REACTIVE NON-REACTIVE   SIGNAL TO CUT-OFF 0.03 <1.00  Lipid panel  Result Value Ref Range   Cholesterol 181 <200 mg/dL   HDL 80 > OR = 50 mg/dL   Triglycerides 160 (H) <150 mg/dL   LDL Cholesterol (Calc) 75 mg/dL (calc)   Total CHOL/HDL Ratio 2.3 <5.0 (calc)   Non-HDL Cholesterol (Calc) 101 <130 mg/dL (calc)  COMPLETE METABOLIC PANEL WITH GFR  Result Value Ref Range   Glucose, Bld 102 (H) 65 - 99 mg/dL   BUN 18 7 - 25 mg/dL   Creat 0.75 0.60 - 1.00 mg/dL   eGFR 83 > OR = 60 mL/min/1.16m   BUN/Creatinine Ratio NOT APPLICABLE 6 - 22 (calc)   Sodium 143 135 - 146 mmol/L   Potassium 4.6 3.5 - 5.3 mmol/L   Chloride 106 98 - 110 mmol/L   CO2 28 20 - 32 mmol/L   Calcium 9.9 8.6 - 10.4 mg/dL   Total Protein 6.8 6.1 - 8.1 g/dL   Albumin 4.5 3.6 - 5.1 g/dL   Globulin 2.3 1.9 - 3.7 g/dL (calc)   AG Ratio 2.0 1.0 - 2.5 (calc)   Total Bilirubin 0.3 0.2 - 1.2 mg/dL   Alkaline phosphatase (APISO)  52 37 - 153 U/L   AST 15 10 - 35 U/L   ALT 19 6 - 29 U/L  CBC with Differential/Platelet  Result Value Ref Range   WBC 6.0 3.8 - 10.8 Thousand/uL   RBC 4.52 3.80 - 5.10 Million/uL   Hemoglobin 13.9 11.7 - 15.5 g/dL   HCT 42.8 35.0 - 45.0 %   MCV 94.7 80.0 - 100.0 fL   MCH 30.8 27.0 - 33.0 pg   MCHC 32.5 32.0 - 36.0 g/dL   RDW 12.0 11.0 - 15.0 %   Platelets 256 140 - 400 Thousand/uL   MPV 10.9 7.5 - 12.5 fL   Neutro Abs 3,954 1,500 - 7,800 cells/uL   Lymphs Abs 1,248 850 - 3,900 cells/uL   Absolute Monocytes 492 200 - 950 cells/uL   Eosinophils Absolute 258 15 - 500 cells/uL   Basophils Absolute 48 0 - 200 cells/uL   Neutrophils Relative % 65.9 %   Total Lymphocyte 20.8 %   Monocytes Relative 8.2 %   Eosinophils Relative 4.3 %   Basophils Relative 0.8 %  Hemoglobin A1c  Result Value Ref Range   Hgb A1c MFr Bld 5.3 <5.7 % of total Hgb   Mean Plasma Glucose 105 mg/dL   eAG (mmol/L) 5.8 mmol/L      Assessment & Plan:   Problem List Items Addressed This Visit   None Visit Diagnoses     Skin induration    -  Primary   Relevant Medications   triamcinolone cream (KENALOG) 0.5 %   predniSONE (DELTASONE) 10 MG tablet   Bug bite of face without infection       Itching       Relevant Medications   predniSONE (DELTASONE) 10 MG tablet   Allergic reaction, initial encounter           Left eyelid swelling does not appear to be bug bite maybe reaction to topical exposure from beauty product No sign of infection Recommend ice packs, avoid further exposure  If worsening redness pain eye drainage or sign of infection / fever etc - let me know or get seen asap need  to treat with antibiotic.  Other areas on body are swollen with induration from allergic reaction to the bug bites  Use topical triamcinolone on body instead of cortisone, use twice a day for 2 weeks  Ice packs as needed  Take prednisone short term 4 day course only for significant swelling or allergic reaction not  improving.  Meds ordered this encounter  Medications   triamcinolone cream (KENALOG) 0.5 %    Sig: Apply 1 Application topically 2 (two) times daily. To affected areas, for up to 2 weeks.    Dispense:  30 g    Refill:  1   predniSONE (DELTASONE) 10 MG tablet    Sig: Take 4 tabs with breakfast Day 1, 3 tabs Day 2, 2 tabs Day 3, 1 tabs Day 4    Dispense:  10 tablet    Refill:  0      Follow up plan: Return if symptoms worsen or fail to improve.   Nobie Putnam, [facility] 06/29/2021, 4:16 PM

## 2021-07-17 DIAGNOSIS — J019 Acute sinusitis, unspecified: Secondary | ICD-10-CM | POA: Diagnosis not present

## 2021-07-17 DIAGNOSIS — B9789 Other viral agents as the cause of diseases classified elsewhere: Secondary | ICD-10-CM | POA: Diagnosis not present

## 2021-07-19 DIAGNOSIS — J301 Allergic rhinitis due to pollen: Secondary | ICD-10-CM | POA: Diagnosis not present

## 2021-07-22 DIAGNOSIS — J301 Allergic rhinitis due to pollen: Secondary | ICD-10-CM | POA: Diagnosis not present

## 2021-07-25 DIAGNOSIS — J301 Allergic rhinitis due to pollen: Secondary | ICD-10-CM | POA: Diagnosis not present

## 2021-07-28 DIAGNOSIS — J301 Allergic rhinitis due to pollen: Secondary | ICD-10-CM | POA: Diagnosis not present

## 2021-07-30 ENCOUNTER — Other Ambulatory Visit: Payer: Self-pay | Admitting: Family Medicine

## 2021-07-30 DIAGNOSIS — I10 Essential (primary) hypertension: Secondary | ICD-10-CM

## 2021-08-01 DIAGNOSIS — J301 Allergic rhinitis due to pollen: Secondary | ICD-10-CM | POA: Diagnosis not present

## 2021-08-01 NOTE — Telephone Encounter (Signed)
Requested Prescriptions  Pending Prescriptions Disp Refills  . amLODipine (NORVASC) 10 MG tablet [Pharmacy Med Name: amLODIPine Besylate 10 MG Oral Tablet] 90 tablet 2    Sig: Take 1 tablet by mouth once daily     Cardiovascular: Calcium Channel Blockers 2 Passed - 07/30/2021  6:15 PM      Passed - Last BP in normal range    BP Readings from Last 1 Encounters:  06/29/21 126/62         Passed - Last Heart Rate in normal range    Pulse Readings from Last 1 Encounters:  06/29/21 74         Passed - Valid encounter within last 6 months    Recent Outpatient Visits          1 month ago Skin induration   [place], DO   5 months ago Annual physical exam   [facility] Olin Hauser, DO   5 months ago Acute non-recurrent frontal sinusitis   [place], DO   8 months ago Primary insomnia   [place], DO   11 months ago Intractable acute post-traumatic headache   [facility] Olin Hauser, DO      Future Appointments            In 1 month Hollice Espy, MD [place]   In 6 months [place] Ranger, Devonne Doughty, [facility], [facility]

## 2021-08-04 DIAGNOSIS — J301 Allergic rhinitis due to pollen: Secondary | ICD-10-CM | POA: Diagnosis not present

## 2021-08-05 DIAGNOSIS — J301 Allergic rhinitis due to pollen: Secondary | ICD-10-CM | POA: Diagnosis not present

## 2021-08-11 DIAGNOSIS — J301 Allergic rhinitis due to pollen: Secondary | ICD-10-CM | POA: Diagnosis not present

## 2021-08-12 ENCOUNTER — Ambulatory Visit (INDEPENDENT_AMBULATORY_CARE_PROVIDER_SITE_OTHER): Payer: PPO | Admitting: Internal Medicine

## 2021-08-12 ENCOUNTER — Encounter: Payer: Self-pay | Admitting: Internal Medicine

## 2021-08-12 DIAGNOSIS — B9789 Other viral agents as the cause of diseases classified elsewhere: Secondary | ICD-10-CM | POA: Diagnosis not present

## 2021-08-12 DIAGNOSIS — J329 Chronic sinusitis, unspecified: Secondary | ICD-10-CM

## 2021-08-12 MED ORDER — PREDNISONE 10 MG PO TABS: ORAL_TABLET | ORAL | 0 refills | Status: DC

## 2021-08-12 NOTE — Progress Notes (Signed)
Virtual Visit via Telephone Note  I connected with Amber Brooks on 08/12/21 at  4:00 PM EDT by telephone and verified that I am speaking with the correct person using two identifiers.  Location: Patient: Home Provider: Office  Persons participating in this telephone call: Webb Silversmith, NP and Merrily Pew.   I discussed the limitations, risks, security and privacy concerns of performing an evaluation and management service by telephone and the availability of in person appointments. I also discussed with the patient that there may be a patient responsible charge related to this service. The patient expressed understanding and agreed to proceed.   History of Present Illness:  Patient reports dizziness, left ear pain, runny nose and cough. This started 1 month ago. The dizziness is worse with bending forward or when she gets up in the morning.  She describes the left ear pain as achy. She is blowing clear mucous out of her nose.  The cough is mostly nonproductive.  She denies headache, facial pressure, nasal congestion, sore throat, shortness of breath.  She denies fever, chills or body aches.  Her antihistamine was stopped by her allergist due to dry eyes.  She uses Flonase as needed.  She was treated for viral sinusitis 7/16 at Urgent Care but reports her symptoms did not improve.  She does follow with an allergist and gets allergy shots weekly.  She reports that her allergist has ordered a CT of her sinuses that she has not had done yet.   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 180 capsule 2   fluticasone (FLONASE) 50 MCG/ACT nasal spray Place 2 sprays into both nostrils daily. 48 mL 3   ibuprofen (ADVIL) 800 MG tablet TAKE 1 TABLET BY MOUTH EVERY 8 HOURS AS NEEDED FOR HEADACHE 90 tablet 0   Multiple Vitamin (MULTIVITAMIN WITH MINERALS) TABS tablet Take 1 tablet by mouth daily.     NON FORMULARY Take 1 capsule by mouth daily. Tru Niagen     Omega 3 1000 MG CAPS Take 1,000 mg by mouth daily.     predniSONE (DELTASONE) 10 MG tablet Take 4 tabs with breakfast Day 1, 3 tabs Day 2, 2 tabs Day 3, 1 tabs Day 4 10 tablet 0   Probiotic Product (PROBIOTIC-10 PO) Take 1 capsule by mouth daily.     rosuvastatin (CRESTOR) 10 MG tablet Take 1 tablet (10 mg total) by mouth at bedtime. 90 tablet 3   traZODone (DESYREL) 50 MG tablet TAKE 1 TABLET BY MOUTH AT BEDTIME 90 tablet 3   triamcinolone cream (KENALOG) 0.5 % Apply 1 Application topically 2 (two) times daily. To affected areas, for up to 2 weeks. 30 g 1   venlafaxine XR (EFFEXOR-XR) 150 MG  24 hr capsule TAKE 1 CAPSULE BY MOUTH ONCE DAILY WITH BREAKFAST 90 capsule 0   vitamin B-12 (CYANOCOBALAMIN) 1000 MCG tablet Take 1,000 mcg by mouth daily.     vitamin C (ASCORBIC ACID) 500 MG tablet Take 500 mg by mouth daily.     No current facility-administered medications for this visit.    Allergies  Allergen Reactions   Latex Anaphylaxis   Hydromorphone Itching    Family History  Problem Relation Age of Onset   Stroke Father    Heart disease Father    Dementia Father    Breast cancer Cousin    Dementia Brother     Social History   Socioeconomic History   Marital status: Divorced    Spouse name: Not on file   Number of children: 1   Years of education: collerge   Highest education level: Not on  file  Occupational History   Occupation: [employer]  Tobacco Use   Smoking status: Never   Smokeless tobacco: Never  Vaping Use   Vaping Use: Never used  Substance and Sexual Activity   Alcohol use: Yes    Alcohol/week: 1.0 standard drink of alcohol    Types: 1 Glasses of wine per week    Comment: every evening   Drug use: No   Sexual activity: Not Currently  Other Topics Concern   Not on file  Social History Narrative   Right handed.  Caffeine decaf 1 cup in am.  Divorced.  1 adopted daughter.  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    Social Determinants of Health   Financial Resource Strain: Low Risk  (02/21/2021)   Overall Financial Resource Strain (CARDIA)    Difficulty of Paying Living Expenses: Not hard at all  Food Insecurity: No Food Insecurity (02/21/2021)   Hunger Vital Sign    Worried About Running Out of Food in the Last Year: Never true    Ran Out of Food in the Last Year: Never true  Transportation Needs: No Transportation Needs (02/21/2021)   PRAPARE - Hydrologist (Medical): No    Lack of Transportation (Non-Medical): No  Physical Activity: Insufficiently Active (02/21/2021)   Exercise Vital Sign    Days of Exercise per Week: 4 days    Minutes of Exercise per Session: 30 min  Stress: No Stress Concern Present (02/21/2021)   [place]    Feeling of Stress : Only a little  Social Connections: Moderately Isolated (02/21/2021)   Social Connection and Isolation Panel [NHANES]    Frequency of Communication with Friends and Family: More than three times a week    Frequency of Social Gatherings with Friends and Family: More than three times a week    Attends Religious Services: 1 to 4 times per year    Active Member of [employer] or Organizations: No    Attends Archivist Meetings: Never    Marital Status: Divorced  Human resources officer Violence: Not on file      Constitutional: Denies fever, malaise, fatigue, headache or abrupt weight changes.  HEENT: Patient reports runny nose, left ear pain.  Denies eye pain, eye redness, ringing in the ears, wax buildup,  nasal congestion, bloody nose, or sore throat. Respiratory: Patient reports cough.  Denies difficulty breathing, shortness of breath, or sputum production.   Neurological: Patient reports dizziness.  Denies difficulty with memory, difficulty with speech or problems with balance and coordination.    No other  specific complaints in a complete review of systems (except as listed in HPI above).    Observations/Objective:  Wt Readings from Last 3 Encounters:  06/29/21 112 lb (50.8 kg)  03/21/21 112 lb (50.8 kg)  02/21/21 111 lb (50.3 kg)    General: In NAD. HEENT: Nose: Slight congestion noted; Throat/Mouth: Hoarseness noted Pulmonary/Chest: Normal effort. No respiratory distress.  Neurological: Alert and oriented.   BMET    Component Value Date/Time   NA 143 02/14/2021 0811   NA 144 01/12/2020 1438   K 4.6 02/14/2021 0811   CL 106 02/14/2021 0811   CO2 28 02/14/2021 0811   GLUCOSE 102 (H) 02/14/2021 0811   BUN 18 02/14/2021 0811   BUN 15 01/12/2020 1438   CREATININE 0.75 02/14/2021 0811   CALCIUM 9.9 02/14/2021 0811   GFRNONAA >60 03/09/2020 1351   GFRAA 90 01/12/2020 1438    Lipid Panel     Component Value Date/Time   CHOL 181 02/14/2021 0811   TRIG 160 (H) 02/14/2021 0811   HDL 80 02/14/2021 0811   CHOLHDL 2.3 02/14/2021 0811   VLDL 33 04/07/2014 0425   LDLCALC 75 02/14/2021 0811    CBC    Component Value Date/Time   WBC 6.0 02/14/2021 0811   RBC 4.52 02/14/2021 0811   HGB 13.9 02/14/2021 0811   HCT 42.8 02/14/2021 0811   PLT 256 02/14/2021 0811   MCV 94.7 02/14/2021 0811   MCH 30.8 02/14/2021 0811   MCHC 32.5 02/14/2021 0811   RDW 12.0 02/14/2021 0811   LYMPHSABS 1,248 02/14/2021 0811   MONOABS 0.4 04/26/2014 1055   EOSABS 258 02/14/2021 0811    BASOSABS 48 02/14/2021 0811    Hgb A1C Lab Results  Component Value Date   HGBA1C 5.3 02/14/2021        Assessment and Plan:  Viral Sinusitis:  No indication of bacterial infection at this time so we will hold off on antibiotics Rx for Pred taper x 6 days She will continue to get her allergy shots weekly She will go ahead and schedule her CT sinus when she finds the time She will continue to follow with allergy  Return precautions discussed  Follow Up Instructions:    I discussed the assessment and treatment plan with the patient. The patient was provided an opportunity to ask questions and all were answered. The patient agreed with the plan and demonstrated an understanding of the instructions.   The patient was advised to call back or seek an in-person evaluation if the symptoms worsen or if the condition fails to improve as anticipated.  I provided 5:39 minutes of non-face-to-face time during this encounter.   Webb Silversmith, NP

## 2021-08-12 NOTE — Patient Instructions (Signed)

## 2021-08-15 DIAGNOSIS — J301 Allergic rhinitis due to pollen: Secondary | ICD-10-CM | POA: Diagnosis not present

## 2021-08-18 DIAGNOSIS — J301 Allergic rhinitis due to pollen: Secondary | ICD-10-CM | POA: Diagnosis not present

## 2021-08-22 DIAGNOSIS — J301 Allergic rhinitis due to pollen: Secondary | ICD-10-CM | POA: Diagnosis not present

## 2021-08-26 ENCOUNTER — Encounter: Payer: Self-pay | Admitting: Physician Assistant

## 2021-08-26 ENCOUNTER — Ambulatory Visit (INDEPENDENT_AMBULATORY_CARE_PROVIDER_SITE_OTHER): Payer: PPO | Admitting: Physician Assistant

## 2021-08-26 VITALS — BP 135/59 | HR 70 | Wt 112.0 lb

## 2021-08-26 DIAGNOSIS — R42 Dizziness and giddiness: Secondary | ICD-10-CM

## 2021-08-26 MED ORDER — SCOPOLAMINE 1 MG/3DAYS TD PT72: 1.0000 | MEDICATED_PATCH | TRANSDERMAL | 0 refills | Status: DC

## 2021-08-26 NOTE — Progress Notes (Signed)
Acute Office Visit   Patient: Amber Brooks   DOB: 02-06-1945   76 y.o. Female  MRN: [id number] Visit Date: 08/26/2021  Today's healthcare provider: Dani Gobble Kore Madlock, PA-C  Introduced myself to the patient as a Journalist, newspaper and provided education on APPs in clinical practice.    Chief Complaint  Patient presents with   Neck Pain   Dizziness   Burn   Subjective    HPI    Neck Pain Has been occurring since June with dizziness  Reports she has to look up at a computer screen all day and when she looks down she gets dizzy    Dizziness Reports she gets dizzy when she looks down from looking up at computer screens Reports she also has symptoms when she gets up out of bed Has been getting allergy shots to help with sinuses and allergies Was given Prednisone prior to today's apt but this did not seem to help She has been using nasal sprays and has tried the Epley maneuver at home - has not helped much  Reports associated nausea and some neck pain from computer usage  Burn on hand States she burned her hand with curling iron on Monday Has been putting Neosporin on it and wrapping      Medications: Outpatient Medications Prior to Visit  Medication Sig   amLODipine (NORVASC) 10 MG tablet Take 1 tablet by mouth once daily   carvedilol (COREG) 25 MG tablet Take 1 tablet by mouth twice daily   esomeprazole (NEXIUM) 40 MG capsule Take 1 capsule by mouth 2 times daily before a meal.   fluticasone (FLONASE) 50 MCG/ACT nasal spray Place 2 sprays into both nostrils daily.   ibuprofen (ADVIL) 800 MG tablet TAKE 1 TABLET BY MOUTH EVERY 8 HOURS AS NEEDED FOR HEADACHE   Multiple Vitamin (MULTIVITAMIN WITH MINERALS) TABS tablet Take 1 tablet by mouth daily.   NON FORMULARY Take 1 capsule by mouth daily. Tru Niagen   Omega 3 1000 MG CAPS Take 1,000 mg by mouth daily.   predniSONE (DELTASONE) 10 MG tablet Take 6 tabs on day 1, 5 tabs on day 2, 4 tabs on day 3, 3 tabs on day 4, 2 tabs on  day 5, 1 tab on day 6   Probiotic Product (PROBIOTIC-10 PO) Take 1 capsule by mouth daily.   rosuvastatin (CRESTOR) 10 MG tablet Take 1 tablet (10 mg total) by mouth at bedtime.   traZODone (DESYREL) 50 MG tablet TAKE 1 TABLET BY MOUTH AT BEDTIME   triamcinolone cream (KENALOG) 0.5 % Apply 1 Application topically 2 (two) times daily. To affected areas, for up to 2 weeks.   venlafaxine XR (EFFEXOR-XR) 150 MG 24 hr capsule TAKE 1 CAPSULE BY MOUTH ONCE DAILY WITH BREAKFAST   vitamin B-12 (CYANOCOBALAMIN) 1000 MCG tablet Take 1,000 mcg by mouth daily.   vitamin C (ASCORBIC ACID) 500 MG tablet Take 500 mg by mouth daily.   No facility-administered medications prior to visit.    Review of Systems  Gastrointestinal:  Positive for nausea.  Neurological:  Positive for dizziness.       Objective    BP (!) 135/59   Pulse 70   Wt 112 lb (50.8 kg)   SpO2 100%   BMI 20.49 kg/m      Physical Exam Vitals reviewed.  Constitutional:      General: She is awake.     Appearance: Normal appearance. She is well-developed, well-groomed  and normal weight.  HENT:     Head: Normocephalic and atraumatic.     Right Ear: Hearing, tympanic membrane and ear canal normal.     Left Ear: Hearing, tympanic membrane and ear canal normal.  Eyes:     General: Lids are normal. Gaze aligned appropriately.     Extraocular Movements: Extraocular movements intact.     Right eye: Normal extraocular motion and no nystagmus.     Left eye: Normal extraocular motion and no nystagmus.     Conjunctiva/sclera: Conjunctivae normal.     Pupils: Pupils are equal, round, and reactive to light.  Musculoskeletal:     Cervical back: Normal range of motion and neck supple. No rigidity, torticollis or crepitus. No pain with movement. Normal range of motion.  Neurological:     General: No focal deficit present.     Mental Status: She is alert and oriented to person, place, and time.     GCS: GCS eye subscore is 4. GCS verbal  subscore is 5. GCS motor subscore is 6.     Cranial Nerves: Cranial nerves 2-12 are intact. No dysarthria or facial asymmetry.     Motor: Motor function is intact. No weakness, tremor or atrophy.  Psychiatric:        Attention and Perception: Attention and perception normal.        Mood and Affect: Mood and affect normal.        Speech: Speech normal.        Behavior: Behavior normal. Behavior is cooperative.       No results found for any visits on 08/26/21.  Assessment & Plan      No follow-ups on file.        Problem List Items Addressed This Visit   None Visit Diagnoses     Dizziness    -  Primary Acute, recurrent concern Reports she is having intermittent episodes of dizziness with some neck pain and nausea Suspect this is from ergonomics at work but advised patient to keep a symptom log to assist with potential etiology rule out She reports she is taking allergy shots to assist with sinuses Reports she eats and drinks water regularly throughout the day Orthostatics checked in office- no significant decreases noted with ambulation Will provide Scopolamine patches to assist with nausea and dizziness as needed Recommend follow up as needed for persistent or worsening symptoms     Relevant Medications   scopolamine (TRANSDERM-SCOP) 1 MG/3DAYS        No follow-ups on file.   I, Lion Fernandez E Linc Renne, PA-C, have reviewed all documentation for this visit. The documentation on 08/26/21 for the exam, diagnosis, procedures, and orders are all accurate and complete.   Talitha Givens, MHS, PA-C [facility]

## 2021-08-26 NOTE — Patient Instructions (Addendum)
Keep a journal of your symptoms so we can see if there are any potential triggers for your dizziness  Remember to use Aquaphor and Vaseline on your burn, no neosporin and try to let it air as you are able.

## 2021-08-29 DIAGNOSIS — J301 Allergic rhinitis due to pollen: Secondary | ICD-10-CM | POA: Diagnosis not present

## 2021-09-01 DIAGNOSIS — J301 Allergic rhinitis due to pollen: Secondary | ICD-10-CM | POA: Diagnosis not present

## 2021-09-02 DIAGNOSIS — J301 Allergic rhinitis due to pollen: Secondary | ICD-10-CM | POA: Diagnosis not present

## 2021-09-06 DIAGNOSIS — J309 Allergic rhinitis, unspecified: Secondary | ICD-10-CM | POA: Diagnosis not present

## 2021-09-06 DIAGNOSIS — H8111 Benign paroxysmal vertigo, right ear: Secondary | ICD-10-CM | POA: Diagnosis not present

## 2021-09-06 DIAGNOSIS — J329 Chronic sinusitis, unspecified: Secondary | ICD-10-CM | POA: Diagnosis not present

## 2021-09-08 DIAGNOSIS — J301 Allergic rhinitis due to pollen: Secondary | ICD-10-CM | POA: Diagnosis not present

## 2021-09-10 ENCOUNTER — Other Ambulatory Visit: Payer: Self-pay | Admitting: Family Medicine

## 2021-09-10 DIAGNOSIS — F3342 Major depressive disorder, recurrent, in full remission: Secondary | ICD-10-CM

## 2021-09-12 ENCOUNTER — Other Ambulatory Visit (HOSPITAL_COMMUNITY): Payer: Self-pay

## 2021-09-12 DIAGNOSIS — J301 Allergic rhinitis due to pollen: Secondary | ICD-10-CM | POA: Diagnosis not present

## 2021-09-12 MED ORDER — VENLAFAXINE HCL ER 150 MG PO CP24
150.0000 mg | ORAL_CAPSULE | Freq: Every day | ORAL | 0 refills | Status: DC
  Filled 2021-09-12: qty 90, 90d supply, fill #0

## 2021-09-13 NOTE — Telephone Encounter (Signed)
Called pt, got clarification on pharmacy. Pt doesn't use [facility] pharmacy and unsure how medication got sent there but needed sent to [facility]. Requested Prescriptions  Pending Prescriptions Disp Refills  . venlafaxine XR (EFFEXOR-XR) 150 MG 24 hr capsule [Pharmacy Med Name: Venlafaxine HCl ER 150 MG Oral Capsule Extended Release 24 Hour] 90 capsule 0    Sig: TAKE 1 CAPSULE BY MOUTH ONCE DAILY WITH BREAKFAST     Psychiatry: Antidepressants - SNRI - desvenlafaxine & venlafaxine Failed - 09/10/2021  5:10 PM      Failed - Lipid Panel in normal range within the last 12 months    Cholesterol  Date Value Ref Range Status  02/14/2021 181 <200 mg/dL Final   LDL Cholesterol (Calc)  Date Value Ref Range Status  02/14/2021 75 mg/dL (calc) Final    Comment:    Reference range: <100 . Desirable range <100 mg/dL for primary prevention;   <70 mg/dL for patients with CHD or diabetic patients  with > or = 2 CHD risk factors. Marland Kitchen LDL-C is now calculated using the Martin-Hopkins  calculation, which is a validated novel method providing  better accuracy than the Friedewald equation in the  estimation of LDL-C.  Cresenciano Genre et al. Annamaria Helling. 3559;741(63): 2061-2068  (http://education.[facility].com/faq/FAQ164)    HDL  Date Value Ref Range Status  02/14/2021 80 > OR = 50 mg/dL Final   Triglycerides  Date Value Ref Range Status  02/14/2021 160 (H) <150 mg/dL Final         Passed - Cr in normal range and within 360 days    Creat  Date Value Ref Range Status  02/14/2021 0.75 0.60 - 1.00 mg/dL Final         Passed - Completed PHQ-2 or PHQ-9 in the last 360 days      Passed - Last BP in normal range    BP Readings from Last 1 Encounters:  08/26/21 (!) 135/59         Passed - Valid encounter within last 6 months    Recent Outpatient Visits          2 weeks ago [place], [place]   1 month ago Viral sinusitis   [facility] [place], Coralie Keens,  NP   2 months ago Skin induration   [place], DO   6 months ago Annual physical exam   [place], DO   7 months ago Acute non-recurrent frontal sinusitis   [place], Devonne Doughty, DO      Future Appointments            In 2 weeks Hollice Espy, MD [place]   In 5 months [place], Devonne Doughty, [facility], [facility]

## 2021-09-15 ENCOUNTER — Other Ambulatory Visit (HOSPITAL_COMMUNITY): Payer: Self-pay

## 2021-09-15 DIAGNOSIS — J301 Allergic rhinitis due to pollen: Secondary | ICD-10-CM | POA: Diagnosis not present

## 2021-09-16 DIAGNOSIS — H8112 Benign paroxysmal vertigo, left ear: Secondary | ICD-10-CM | POA: Diagnosis not present

## 2021-09-19 ENCOUNTER — Other Ambulatory Visit (HOSPITAL_COMMUNITY): Payer: Self-pay

## 2021-09-19 DIAGNOSIS — J301 Allergic rhinitis due to pollen: Secondary | ICD-10-CM | POA: Diagnosis not present

## 2021-09-20 DIAGNOSIS — J301 Allergic rhinitis due to pollen: Secondary | ICD-10-CM | POA: Diagnosis not present

## 2021-09-22 DIAGNOSIS — J301 Allergic rhinitis due to pollen: Secondary | ICD-10-CM | POA: Diagnosis not present

## 2021-09-23 ENCOUNTER — Ambulatory Visit
Admission: RE | Admit: 2021-09-23 | Discharge: 2021-09-23 | Disposition: A | Discharge status: Home / Self Care | Payer: PPO | Source: Ambulatory Visit | Attending: Urology | Admitting: Urology

## 2021-09-23 DIAGNOSIS — N2889 Other specified disorders of kidney and ureter: Secondary | ICD-10-CM | POA: Diagnosis not present

## 2021-09-23 DIAGNOSIS — K5730 Diverticulosis of large intestine without perforation or abscess without bleeding: Secondary | ICD-10-CM | POA: Diagnosis not present

## 2021-09-23 LAB — POCT I-STAT CREATININE: Creatinine, Ser: 0.8 mg/dL (ref 0.44–1.00)

## 2021-09-23 MED ORDER — IOHEXOL 300 MG/ML  SOLN
100.0000 mL | Freq: Once | INTRAMUSCULAR | Status: AC | PRN
  Administered 2021-09-23: 100 mL via INTRAVENOUS

## 2021-09-26 DIAGNOSIS — J301 Allergic rhinitis due to pollen: Secondary | ICD-10-CM | POA: Diagnosis not present

## 2021-09-28 ENCOUNTER — Encounter: Payer: Self-pay | Admitting: Urology

## 2021-09-28 ENCOUNTER — Ambulatory Visit: Payer: PPO | Admitting: Urology

## 2021-09-28 VITALS — BP 156/77 | HR 70 | Ht 62.0 in | Wt 112.0 lb

## 2021-09-28 DIAGNOSIS — N2889 Other specified disorders of kidney and ureter: Secondary | ICD-10-CM

## 2021-09-28 NOTE — Progress Notes (Signed)
09/28/2021 1:23 PM   Amber Brooks January 09, 1945 [id number]  Referring provider: Olin Hauser, DO [address] [place],  [place] [address]  Chief Complaint  Patient presents with   Follow-up    1 YEAR follow up with ct scan     HPI: 76 year old female with a personal history of indeterminate left renal mass  She underwent a CT of abdomen and pelvis with contrast on 09/20/2020 that revealed 1.3 cm hypodense left lower pole renal lesion is indeterminate but suspicious for malignancy. Recommend renal protocol MRI of the abdomen with and without contrast for characterization.   09/27/2020 MRI of abdomen showed no evidence of renal mass, hydronephrosis, or other significant abnormality.   She follows up today with repeat CT abdomen with and without contrast.  This shows similar unchanged hypoperfused area of the left kidney.  She denies any flank pain.   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   Brain aneurysm    Complication of anesthesia    Depression    GERD (gastroesophageal reflux disease)    Headache(784.0)    migraines   History of chicken pox    Hyperlipidemia    Hypertension    Mitral valve prolapse    PONV (postoperative nausea and vomiting)    Shingles     Surgical History: Past Surgical History:  Procedure Laterality Date   ABDOMINAL HYSTERECTOMY  1987   ANEURYSM COILING     CHOLECYSTECTOMY  2018   COLONOSCOPY N/A 09/27/2020   Procedure: COLONOSCOPY;  Surgeon: Annamaria Helling, DO;  Location[facility];  Service: Gastroenterology;  Laterality: N/A;   CRANIOTOMY Left 11/15/2012   Procedure: CRANIOTOMY INTRACRANIAL ANEURYSM FOR CAROTID;  Surgeon: Winfield Cunas, MD;  Location: [place] NEURO ORS;  Service: Neurosurgery;  Laterality: Left;  LEFT Craniotomy for Aneurysm   ECTOPIC PREGNANCY SURGERY     ESOPHAGOGASTRODUODENOSCOPY N/A 09/27/2020   Procedure: ESOPHAGOGASTRODUODENOSCOPY (EGD);  Surgeon: Annamaria Helling, DO;  Location[facility] ENDOSCOPY;  Service: Gastroenterology;  Laterality: N/A;   KNEE ARTHROSCOPY     LUMBAR LAMINECTOMY/DECOMPRESSION MICRODISCECTOMY N/A 04/27/2014   Procedure: LUMBAR LAMINECTOMY/DECOMPRESSION MICRODISCECTOMY LUMBAR FOUR-FIVE ;  Surgeon: Kristeen Miss, MD;  Location: [place] NEURO ORS;  Service: Neurosurgery;  Laterality: N/A;   RADIOLOGY WITH ANESTHESIA N/A 10/09/2012   Procedure: RADIOLOGY WITH ANESTHESIA;  Surgeon: Rob Hickman, MD;  Location: [place];  Service: Radiology;  Laterality: N/A;   RENAL ANGIOGRAPHY N/A 03/09/2020   Procedure: RENAL ANGIOGRAPHY;  Surgeon: Katha Cabal, MD;  Location: [place] CV LAB;  Service: Cardiovascular;  Laterality: N/A;   TONSILLECTOMY     VASCULAR SURGERY     stent placed    Home Medications:  Allergies as of 09/28/2021       Reactions   Latex Anaphylaxis   Hydromorphone Itching        Medication List        Accurate as of September 28, 2021  1:23 PM. If you have any questions, ask your nurse or doctor.          STOP taking these medications    scopolamine 1 MG/3DAYS Commonly known as: TRANSDERM-SCOP Stopped by: Hollice Espy, MD   triamcinolone cream 0.5 % Commonly known as: KENALOG Stopped by: Hollice Espy, MD       TAKE these medications    amLODipine 10 MG tablet Commonly known as: NORVASC Take 1 tablet by mouth once daily   ascorbic acid 500  MG tablet Commonly known as: VITAMIN C Take 500 mg by mouth daily.   carvedilol 25 MG tablet Commonly known as: COREG Take 1 tablet by mouth twice daily   cyanocobalamin 1000 MCG tablet Commonly known as: VITAMIN B12 Take 1,000 mcg by mouth daily.   EPINEPHrine 0.3 mg/0.3 mL Soaj injection Commonly known as: EPI-PEN Inject into the muscle once as needed.   esomeprazole 40 MG capsule Commonly known as: NEXIUM Take 1 capsule by mouth 2 times daily before a meal.   fluticasone 50 MCG/ACT nasal spray Commonly known as:  FLONASE Place 2 sprays into both nostrils daily.   ibuprofen 800 MG tablet Commonly known as: ADVIL TAKE 1 TABLET BY MOUTH EVERY 8 HOURS AS NEEDED FOR HEADACHE   multivitamin with minerals Tabs tablet Take 1 tablet by mouth daily.   NON FORMULARY Take 1 capsule by mouth daily. Tru Niagen   Omega 3 1000 MG Caps Take 1,000 mg by mouth daily.   PROBIOTIC-10 PO Take 1 capsule by mouth daily.   rosuvastatin 10 MG tablet Commonly known as: CRESTOR Take 1 tablet (10 mg total) by mouth at bedtime.   traZODone 50 MG tablet Commonly known as: DESYREL TAKE 1 TABLET BY MOUTH AT BEDTIME   venlafaxine XR 150 MG 24 hr capsule Commonly known as: EFFEXOR-XR Take 1 capsule (150 mg total) by mouth daily with breakfast. What changed: Another medication with the same name was removed. Continue taking this medication, and follow the directions you see here. Changed by: Hollice Espy, MD        Allergies:  Allergies  Allergen Reactions   Latex Anaphylaxis   Hydromorphone Itching    Family History: Family History  Problem Relation Age of Onset   Stroke Father    Heart disease Father    Dementia Father    Breast cancer Cousin    Dementia Brother     Social History:  reports that she has never smoked. She has never used smokeless tobacco. She reports current alcohol use of about 1.0 standard drink of alcohol per week. She reports that she does not use drugs.   Physical Exam: BP (!) 156/77   Pulse 70   Ht '5\' 2"'$  (1.575 m)   Wt 112 lb (50.8 kg)   BMI 20.49 kg/m   Constitutional:  Alert and oriented, No acute distress. HEENT: [place] AT, moist mucus membranes.  Trachea midline, no masses. Cardiovascular: No clubbing, cyanosis, or edema. Respiratory: Normal respiratory effort, no increased work of breathing. GI: Abdomen is soft, nontender, nondistended, no abdominal masses GU: No CVA tenderness Skin: No rashes, bruises or suspicious lesions. Neurologic: Grossly intact, no focal  deficits, moving all 4 extremities. Psychiatric: Normal mood and affect.  Laboratory Data: Lab Results  Component Value Date   WBC 6.0 02/14/2021   HGB 13.9 02/14/2021   HCT 42.8 02/14/2021   MCV 94.7 02/14/2021   PLT 256 02/14/2021    Lab Results  Component Value Date   CREATININE 0.80 09/23/2021     IMPRESSION: 1. No significant change in the appearance of focal enhancement abnormality localizing to the inferior pole of the right kidney. Specifically, there is a area of relative hypoenhancement seen on the portal venous phase images only which measures approximately 1.3 cm on the coronal reformatted images. No corresponding area of arterial FA is enhancement noted. Further, no corresponding abnormality noted on the MRI from 09/24/2020. Although this remains indeterminate the 12 months of stability is reassuring. As is abnormality remains indeterminate,  continued interval surveillance of this abnormality is recommended with repeat renal protocol CT in 12 months. 2. Sigmoid diverticulosis without acute diverticulitis. 3. Aortic Atherosclerosis (ICD10-I70.0).     Electronically Signed   By: Kerby Moors M.D.   On: 09/25/2021 17:23  The above CT scan images were personally reviewed.  I disagree with radiologic interpretation, I believe that there is a typo and the lesion in question is in fact on the left side.  Compared to her CT scan a year ago, the laterality is consistent with a left lower pole cortical abnormality.   Assessment & Plan:    1. 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  Stability is more consistent with a benign process such as scarring or artifactual.  We discussed the role of biopsy including the risk of bleeding and/or sampling error.  Given its persistent nature, I do think is reasonable to follow this but on a less frequent basis.  I recommended CT abdomen with and without contrast again in 18  months.  She is agreeable with this plan.   Return in about 18 months (around 03/29/2023) for CT ABD WITH AND W/O  IN 18 MONTHS .  Hollice Espy, MD  [facility] [address] [place], [place] [address] [phone]

## 2021-09-29 DIAGNOSIS — J301 Allergic rhinitis due to pollen: Secondary | ICD-10-CM | POA: Diagnosis not present

## 2021-10-03 DIAGNOSIS — J301 Allergic rhinitis due to pollen: Secondary | ICD-10-CM | POA: Diagnosis not present

## 2021-10-06 DIAGNOSIS — J301 Allergic rhinitis due to pollen: Secondary | ICD-10-CM | POA: Diagnosis not present

## 2021-10-07 ENCOUNTER — Other Ambulatory Visit: Payer: Self-pay | Admitting: Family Medicine

## 2021-10-07 DIAGNOSIS — M62838 Other muscle spasm: Secondary | ICD-10-CM

## 2021-10-07 DIAGNOSIS — M542 Cervicalgia: Secondary | ICD-10-CM

## 2021-10-07 NOTE — Telephone Encounter (Signed)
Requested medication (s) are due for refill today - no  Requested medication (s) are on the active medication list -no  Future visit scheduled -yes  Last refill: 04/09/20  Notes to clinic: non delegated rx- no longer on current medication list   Requested Prescriptions  Pending Prescriptions Disp Refills   cyclobenzaprine (FLEXERIL) 10 MG tablet [Pharmacy Med Name: Cyclobenzaprine HCl 10 MG Oral Tablet] 30 tablet 0    Sig: TAKE 1/2 TO 1 (ONE-HALF TO ONE) TABLET BY MOUTH THREE TIMES DAILY AS NEEDED FOR MUSCLE SPASMS     Not Delegated - Analgesics:  Muscle Relaxants Failed - 10/07/2021  8:47 AM      Failed - This refill cannot be delegated      Passed - Valid encounter within last 6 months    Recent Outpatient Visits           1 month ago [place], [place], [place]   1 month ago Viral sinusitis   [facility] [place], Coralie Keens, NP   3 months ago Skin induration   [place], DO   7 months ago Annual physical exam   [facility] [place], Devonne Doughty, DO   7 months ago Acute non-recurrent frontal sinusitis   [place], DO       Future Appointments             In 4 months [place], Devonne Doughty, [facility], [place] Prescriptions  Pending Prescriptions Disp Refills   cyclobenzaprine (FLEXERIL) 10 MG tablet [Pharmacy Med Name: Cyclobenzaprine HCl 10 MG Oral Tablet] 30 tablet 0    Sig: TAKE 1/2 TO 1 (ONE-HALF TO ONE) TABLET BY MOUTH THREE TIMES DAILY AS NEEDED FOR MUSCLE SPASMS     Not Delegated - Analgesics:  Muscle Relaxants Failed - 10/07/2021  8:47 AM      Failed - This refill cannot be delegated      Passed - Valid encounter within last 6 months    Recent Outpatient Visits           1 month ago [place], [place]   1 month ago  Viral sinusitis   [facility] [place], Coralie Keens, NP   3 months ago Skin induration   [place], DO   7 months ago Annual physical exam   [place], DO   7 months ago Acute non-recurrent frontal sinusitis   [place], DO       Future Appointments             In 4 months [place], Devonne Doughty, [facility], [facility]

## 2021-10-09 ENCOUNTER — Other Ambulatory Visit: Payer: Self-pay | Admitting: Family Medicine

## 2021-10-09 DIAGNOSIS — M62838 Other muscle spasm: Secondary | ICD-10-CM

## 2021-10-09 DIAGNOSIS — R519 Headache, unspecified: Secondary | ICD-10-CM

## 2021-10-10 DIAGNOSIS — J301 Allergic rhinitis due to pollen: Secondary | ICD-10-CM | POA: Diagnosis not present

## 2021-10-10 NOTE — Telephone Encounter (Signed)
Requested Prescriptions  Pending Prescriptions Disp Refills  . ibuprofen (ADVIL) 800 MG tablet [Pharmacy Med Name: Ibuprofen 800 MG Oral Tablet] 90 tablet 1    Sig: TAKE 1 TABLET BY MOUTH EVERY 8 HOURS AS NEEDED FOR HEADACHE     Analgesics:  NSAIDS Failed - 10/09/2021  3:57 PM      Failed - Manual Review: Labs are only required if the patient has taken medication for more than 8 weeks.      Passed - Cr in normal range and within 360 days    Creat  Date Value Ref Range Status  02/14/2021 0.75 0.60 - 1.00 mg/dL Final   Creatinine, Ser  Date Value Ref Range Status  09/23/2021 0.80 0.44 - 1.00 mg/dL Final         Passed - HGB in normal range and within 360 days    Hemoglobin  Date Value Ref Range Status  02/14/2021 13.9 11.7 - 15.5 g/dL Final         Passed - PLT in normal range and within 360 days    Platelets  Date Value Ref Range Status  02/14/2021 256 140 - 400 Thousand/uL Final         Passed - HCT in normal range and within 360 days    HCT  Date Value Ref Range Status  02/14/2021 42.8 35.0 - 45.0 % Final         Passed - eGFR is 30 or above and within 360 days    GFR calc Af Amer  Date Value Ref Range Status  01/12/2020 90 >59 mL/min/1.73 Final    Comment:    **In accordance with recommendations from the NKF-ASN Task force,**   [facility] is in the process of updating its eGFR calculation to the   2021 CKD-EPI creatinine equation that estimates kidney function   without a race variable.    GFR, Estimated  Date Value Ref Range Status  03/09/2020 >60 >60 mL/min Final    Comment:    (NOTE) Calculated using the CKD-EPI Creatinine Equation (2021) Performed at [facility], [address] [place], [place] [address]    eGFR  Date Value Ref Range Status  02/14/2021 83 > OR = 60 mL/min/1.15m Final    Comment:    The eGFR is based on the CKD-EPI 2021 equation. To calculate  the new eGFR from a previous Creatinine or Cystatin C result, go to  https://www.kidney.org/professionals/ kdoqi/gfr%5Fcalculator          Passed - Patient is not pregnant      Passed - Valid encounter within last 12 months    Recent Outpatient Visits          [place]  1 month ago [facility] [place] [facility]   3 months ago Skin induration   [place]   7 months ago Annual physical exam   [place]   8 months ago Acute non-recurrent frontal sinusitis   [place]      Future Appointments            In 4 months K[place] [facility]           . baclofen (LIORESAL) 10 MG tablet [Pharmacy Med Name: Baclofen 10 MG Oral  Tablet] 30 tablet 0    Sig: TAKE 1/2 TO 1 (ONE-HALF TO ONE) TABLET BY MOUTH THREE TIMES DAILY AS NEEDED FOR MUSCLE SPASM STOP  FLEXERIL  SWITCH  TO  MUSCLE  RELAXANT     Analgesics:  Muscle Relaxants - baclofen Failed - 10/09/2021  3:57 PM      Failed - eGFR is 30 or above and within 180 days    GFR calc Af Amer  Date Value Ref Range Status  01/12/2020 90 >59 mL/min/1.73 Final    Comment:    **In accordance with recommendations from the NKF-ASN Task force,**   [facility] is in the process of updating its eGFR calculation to the   2021 CKD-EPI creatinine equation that estimates kidney function   without a race variable.    GFR, Estimated  Date Value Ref Range Status  03/09/2020 >60 >60 mL/min Final    Comment:    (NOTE) Calculated using the CKD-EPI Creatinine Equation (2021) Performed at [facility], [address] [place], [place] [address]    eGFR  Date Value Ref Range Status  02/14/2021 83 > OR = 60 mL/min/1.80m Final    Comment:    The eGFR is based on the CKD-EPI 2021 equation. To calculate  the new eGFR from a previous  Creatinine or Cystatin C result, go to https://www.kidney.org/professionals/ kdoqi/gfr%5Fcalculator          Passed - Cr in normal range and within 180 days    Creat  Date Value Ref Range Status  02/14/2021 0.75 0.60 - 1.00 mg/dL Final   Creatinine, Ser  Date Value Ref Range Status  09/23/2021 0.80 0.44 - 1.00 mg/dL Final         Passed - Valid encounter within last 6 months    Recent Outpatient Visits          1 month ago [place]  1 month ago [facility]al sinusitis   [facility]   3 months ago Skin induration   [place] DO   7 months ago Annual physical exam   [place]   8 months ago Acute non-recurrent frontal sinusitis   [place]      Future Appointments            In 4 months KParks [place] [facility]          Medication d/c'd 05/27/2020  - not on med list.

## 2021-10-13 ENCOUNTER — Other Ambulatory Visit: Payer: Self-pay

## 2021-10-13 ENCOUNTER — Ambulatory Visit (INDEPENDENT_AMBULATORY_CARE_PROVIDER_SITE_OTHER): Payer: PPO

## 2021-10-13 VITALS — Wt 112.0 lb

## 2021-10-13 DIAGNOSIS — R519 Headache, unspecified: Secondary | ICD-10-CM

## 2021-10-13 DIAGNOSIS — M62838 Other muscle spasm: Secondary | ICD-10-CM

## 2021-10-13 DIAGNOSIS — Z23 Encounter for immunization: Secondary | ICD-10-CM

## 2021-10-13 MED ORDER — IBUPROFEN 800 MG PO TABS: ORAL_TABLET | ORAL | 1 refills | Status: DC

## 2021-10-13 MED ORDER — BACLOFEN 10 MG PO TABS: 5.0000 mg | ORAL_TABLET | Freq: Three times a day (TID) | ORAL | 3 refills | Status: DC | PRN

## 2021-10-19 DIAGNOSIS — J301 Allergic rhinitis due to pollen: Secondary | ICD-10-CM | POA: Diagnosis not present

## 2021-10-31 ENCOUNTER — Encounter (INDEPENDENT_AMBULATORY_CARE_PROVIDER_SITE_OTHER): Payer: Self-pay

## 2021-12-01 ENCOUNTER — Ambulatory Visit (INDEPENDENT_AMBULATORY_CARE_PROVIDER_SITE_OTHER): Payer: PPO | Admitting: Family Medicine

## 2021-12-01 ENCOUNTER — Encounter: Payer: Self-pay | Admitting: Family Medicine

## 2021-12-01 VITALS — Ht 62.0 in | Wt 112.0 lb

## 2021-12-01 DIAGNOSIS — J069 Acute upper respiratory infection, unspecified: Secondary | ICD-10-CM | POA: Diagnosis not present

## 2021-12-01 NOTE — Progress Notes (Signed)
Virtual Visit via Telephone The purpose of this virtual visit is to provide medical care while limiting exposure to the novel coronavirus (COVID19) for both patient and office staff.  Consent was obtained for phone visit:  Yes.   Answered questions that patient had about telehealth interaction:  Yes.   I discussed the limitations, risks, security and privacy concerns of performing an evaluation and management service by telephone. I also discussed with the patient that there may be a patient responsible charge related to this service. The patient expressed understanding and agreed to proceed.  Patient Location: Home Provider Location: [facility] (Office)  Participants in virtual visit: - Patient: Amber Brooks - CMA: Orinda Kenner, [place] - Provider: Dr Parks Ranger  ---------------------------------------------------------------------- Chief Complaint  Patient presents with   Cough   Headache    S: Reviewed CMA documentation. I have called patient and gathered additional HPI as follows:  Viral URI Reports that symptoms started this morning 101.85F, she took medication and it improved Tylenol AS NEEDED. She has constellation of symptoms with cough and congestion unable to work today she plans to remain out to rest and recover and take OTC meds. She has had covid before and will take a test, has not done this yet. No sick contacts known. She is vaccinated  Denies any known or suspected exposure to person with or possibly with COVID19.  Denies any shortness of breath, sinus pain or pressure, headache, abdominal pain, diarrhea  Past Medical History:  Diagnosis Date   Anxiety    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   Brain aneurysm    Complication of anesthesia    Depression    GERD (gastroesophageal reflux disease)    Headache(784.0)    migraines   History of chicken pox    Hyperlipidemia    Hypertension    Mitral valve prolapse    PONV (postoperative nausea and vomiting)     Shingles    Social History   Tobacco Use   Smoking status: Never   Smokeless tobacco: Never  Vaping Use   Vaping Use: Never used  Substance Use Topics   Alcohol use: Yes    Alcohol/week: 1.0 standard drink of alcohol    Types: 1 Glasses of wine per week    Comment: every evening   Drug use: No    Current Outpatient Medications:    amLODipine (NORVASC) 10 MG tablet, Take 1 tablet by mouth once daily, Disp: 90 tablet, Rfl: 2   baclofen (LIORESAL) 10 MG tablet, Take 0.5-1 tablets (5-10 mg total) by mouth 3 (three) times daily as needed for muscle spasms. Stop Flexeril, switch to this muscle relaxant., Disp: 30 each, Rfl: 3   carvedilol (COREG) 25 MG tablet, Take 1 tablet by mouth twice daily, Disp: 180 tablet, Rfl: 3   EPINEPHrine 0.3 mg/0.3 mL IJ SOAJ injection, Inject into the muscle once as needed., Disp: , Rfl:    esomeprazole (NEXIUM) 40 MG capsule, Take 1 capsule by mouth 2 times daily before a meal., Disp: 180 capsule, Rfl: 2   fluticasone (FLONASE) 50 MCG/ACT nasal spray, Place 2 sprays into both nostrils daily., Disp: 48 mL, Rfl: 3   ibuprofen (ADVIL) 800 MG tablet, TAKE 1 TABLET BY MOUTH EVERY 8 HOURS AS NEEDED FOR HEADACHE, Disp: 90 tablet, Rfl: 1   Multiple Vitamin (MULTIVITAMIN WITH MINERALS) TABS tablet, Take 1 tablet by mouth daily., Disp: , Rfl:    NON FORMULARY, Take 1 capsule by mouth daily. Tru Niagen, Disp: , Rfl:  Omega 3 1000 MG CAPS, Take 1,000 mg by mouth daily., Disp: , Rfl:    Probiotic Product (PROBIOTIC-10 PO), Take 1 capsule by mouth daily., Disp: , Rfl:    rosuvastatin (CRESTOR) 10 MG tablet, Take 1 tablet (10 mg total) by mouth at bedtime., Disp: 90 tablet, Rfl: 3   traZODone (DESYREL) 50 MG tablet, TAKE 1 TABLET BY MOUTH AT BEDTIME, Disp: 90 tablet, Rfl: 3   venlafaxine XR (EFFEXOR-XR) 150 MG 24 hr capsule, Take 1 capsule (150 mg total) by mouth daily with breakfast., Disp: 90 capsule, Rfl: 0   vitamin B-12 (CYANOCOBALAMIN) 1000 MCG tablet, Take 1,000  mcg by mouth daily., Disp: , Rfl:    vitamin C (ASCORBIC ACID) 500 MG tablet, Take 500 mg by mouth daily., Disp: , Rfl:      02/21/2021    8:40 AM 02/21/2021    8:37 AM 02/10/2021   10:12 AM  Depression screen PHQ 2/9  Decreased Interest 0 0 0  Down, Depressed, Hopeless 0 0 0  PHQ - 2 Score 0 0 0  Altered sleeping '1 1 3  '$ Tired, decreased energy 0 0 0  Change in appetite 0 0 0  Feeling bad or failure about yourself  0 0 0  Trouble concentrating 0 0 0  Moving slowly or fidgety/restless 0 0 0  Suicidal thoughts 0 0 0  PHQ-9 Score '1 1 3  '$ Difficult doing work/chores Not difficult at all Not difficult at all Not difficult at all       02/21/2021    8:40 AM 02/21/2021    8:38 AM 02/10/2021   10:12 AM 11/05/2020    8:49 AM  GAD 7 : Generalized Anxiety Score  Nervous, Anxious, on Edge 0 0 0 0  Control/stop worrying 0 0 0 0  Worry too much - different things 0 0 0 0  Trouble relaxing 0 0 0 0  Restless 0 0 0 0  Easily annoyed or irritable 0 0 0 0  Afraid - awful might happen 0 0 0 0  Total GAD 7 Score 0 0 0 0  Anxiety Difficulty Not difficult at all Not difficult at all Not difficult at all Not difficult at all    -------------------------------------------------------------------------- O: No physical exam performed due to remote telephone encounter.  Lab results reviewed.  Recent Results (from the past 2160 hour(s))  I-STAT creatinine     Status: None   Collection Time: 09/23/21 11:52 AM  Result Value Ref Range   Creatinine, Ser 0.80 0.44 - 1.00 mg/dL    -------------------------------------------------------------------------- A&P:  Problem List Items Addressed This Visit   None Visit Diagnoses     Viral URI    -  Primary      Consistent with viral URI acute onset today, question if COVID FLU RSV - she will do at home COVID test. No sick contacts Febrile now but improved w/ Tylenol   Plan: 1. Reassurance, likely self-limited - will use quarantine precautions and  advise her to remain out of work for now, note sent. - She declines additional medication at this time - Use OTC regimen - Follow up if not improving. Notify us if COVID positive  Improve hydration Tylenol PRN Return criteria given  No orders of the defined types were placed in this encounter.   Follow-up: - Return in 1 week AS NEEDED if not improved  Patient verbalizes understanding with the above medical recommendations including the limitation of remote medical advice.  Specific follow-up and call-back  criteria were given for patient to follow-up or seek medical care more urgently if needed.   - Time spent in direct consultation with patient on phone: 7 minutes   Nobie Putnam, [facility] 12/01/2021, 11:38 AM

## 2021-12-01 NOTE — Patient Instructions (Addendum)
Please check covid test and follow up with our office if abnormal and if you need medication or assistance.  For now use OTC medications as discussed  Work note sent to [employer].   Please schedule a Follow-up Appointment to: Return if symptoms worsen or fail to improve.  If you have any other questions or concerns, please feel free to call the office or send a message through [place]. You may also schedule an earlier appointment if necessary.  Additionally, you may be receiving a survey about your experience at our office within a few days to 1 week by e-mail or mail. We value your feedback.  Amber Putnam, DO [place]

## 2021-12-19 ENCOUNTER — Other Ambulatory Visit: Payer: Self-pay

## 2021-12-19 ENCOUNTER — Encounter: Payer: Self-pay | Admitting: Family Medicine

## 2021-12-19 DIAGNOSIS — F3342 Major depressive disorder, recurrent, in full remission: Secondary | ICD-10-CM

## 2021-12-19 DIAGNOSIS — R519 Headache, unspecified: Secondary | ICD-10-CM

## 2021-12-19 MED ORDER — IBUPROFEN 800 MG PO TABS: ORAL_TABLET | ORAL | 1 refills | Status: DC

## 2021-12-19 MED ORDER — VENLAFAXINE HCL ER 150 MG PO CP24: 150.0000 mg | ORAL_CAPSULE | Freq: Every day | ORAL | 1 refills | Status: DC

## 2022-01-02 DIAGNOSIS — M533 Sacrococcygeal disorders, not elsewhere classified: Secondary | ICD-10-CM | POA: Diagnosis not present

## 2022-02-20 ENCOUNTER — Other Ambulatory Visit: Payer: PPO

## 2022-02-20 ENCOUNTER — Encounter: Payer: Self-pay | Admitting: Family Medicine

## 2022-02-20 ENCOUNTER — Other Ambulatory Visit: Payer: Self-pay

## 2022-02-20 DIAGNOSIS — E782 Mixed hyperlipidemia: Secondary | ICD-10-CM

## 2022-02-20 DIAGNOSIS — I10 Essential (primary) hypertension: Secondary | ICD-10-CM

## 2022-02-20 DIAGNOSIS — Z0000 Encounter for general adult medical examination without abnormal findings: Secondary | ICD-10-CM

## 2022-02-20 DIAGNOSIS — R7309 Other abnormal glucose: Secondary | ICD-10-CM

## 2022-02-21 LAB — CBC WITH DIFFERENTIAL/PLATELET
Absolute Monocytes: 686 cells/uL (ref 200–950)
Basophils Absolute: 39 cells/uL (ref 0–200)
Basophils Relative: 0.4 %
Eosinophils Absolute: 294 cells/uL (ref 15–500)
Eosinophils Relative: 3 %
HCT: 40.5 % (ref 35.0–45.0)
Hemoglobin: 13.7 g/dL (ref 11.7–15.5)
Lymphs Abs: 1343 cells/uL (ref 850–3900)
MCH: 31.4 pg (ref 27.0–33.0)
MCHC: 33.8 g/dL (ref 32.0–36.0)
MCV: 92.7 fL (ref 80.0–100.0)
MPV: 11.2 fL (ref 7.5–12.5)
Monocytes Relative: 7 %
Neutro Abs: 7438 cells/uL (ref 1500–7800)
Neutrophils Relative %: 75.9 %
Platelets: 263 10*3/uL (ref 140–400)
RBC: 4.37 10*6/uL (ref 3.80–5.10)
RDW: 11.8 % (ref 11.0–15.0)
Total Lymphocyte: 13.7 %
WBC: 9.8 10*3/uL (ref 3.8–10.8)

## 2022-02-21 LAB — COMPREHENSIVE METABOLIC PANEL
AG Ratio: 2.1 (calc) (ref 1.0–2.5)
ALT: 20 U/L (ref 6–29)
AST: 21 U/L (ref 10–35)
Albumin: 4.6 g/dL (ref 3.6–5.1)
Alkaline phosphatase (APISO): 61 U/L (ref 37–153)
BUN/Creatinine Ratio: 33 (calc) — ABNORMAL HIGH (ref 6–22)
BUN: 26 mg/dL — ABNORMAL HIGH (ref 7–25)
CO2: 28 mmol/L (ref 20–32)
Calcium: 9.7 mg/dL (ref 8.6–10.4)
Chloride: 104 mmol/L (ref 98–110)
Creat: 0.78 mg/dL (ref 0.60–1.00)
Globulin: 2.2 g/dL (calc) (ref 1.9–3.7)
Glucose, Bld: 94 mg/dL (ref 65–99)
Potassium: 4.9 mmol/L (ref 3.5–5.3)
Sodium: 141 mmol/L (ref 135–146)
Total Bilirubin: 0.4 mg/dL (ref 0.2–1.2)
Total Protein: 6.8 g/dL (ref 6.1–8.1)

## 2022-02-21 LAB — HEMOGLOBIN A1C
Hgb A1c MFr Bld: 5.4 % of total Hgb (ref <5.7)
Mean Plasma Glucose: 108 mg/dL
eAG (mmol/L): 6 mmol/L

## 2022-02-21 LAB — LIPID PANEL
Cholesterol: 166 mg/dL (ref <200)
HDL: 85 mg/dL (ref >=50)
LDL Cholesterol (Calc): 65 mg/dL (calc)
Non-HDL Cholesterol (Calc): 81 mg/dL (calc) (ref <130)
Total CHOL/HDL Ratio: 2 (calc) (ref <5.0)
Triglycerides: 77 mg/dL (ref <150)

## 2022-02-21 LAB — TSH: TSH: 3.29 mIU/L (ref 0.40–4.50)

## 2022-02-24 ENCOUNTER — Ambulatory Visit (INDEPENDENT_AMBULATORY_CARE_PROVIDER_SITE_OTHER): Payer: Medicare Other | Admitting: Family Medicine

## 2022-02-24 ENCOUNTER — Encounter: Payer: Self-pay | Admitting: Family Medicine

## 2022-02-24 ENCOUNTER — Other Ambulatory Visit: Payer: Self-pay | Admitting: Family Medicine

## 2022-02-24 ENCOUNTER — Ambulatory Visit (INDEPENDENT_AMBULATORY_CARE_PROVIDER_SITE_OTHER): Payer: Medicare Other

## 2022-02-24 VITALS — BP 110/64 | HR 67 | Ht 62.0 in | Wt 112.0 lb

## 2022-02-24 DIAGNOSIS — Z0000 Encounter for general adult medical examination without abnormal findings: Secondary | ICD-10-CM

## 2022-02-24 DIAGNOSIS — F3342 Major depressive disorder, recurrent, in full remission: Secondary | ICD-10-CM

## 2022-02-24 DIAGNOSIS — K219 Gastro-esophageal reflux disease without esophagitis: Secondary | ICD-10-CM | POA: Diagnosis not present

## 2022-02-24 DIAGNOSIS — I701 Atherosclerosis of renal artery: Secondary | ICD-10-CM

## 2022-02-24 DIAGNOSIS — I10 Essential (primary) hypertension: Secondary | ICD-10-CM

## 2022-02-24 DIAGNOSIS — K2100 Gastro-esophageal reflux disease with esophagitis, without bleeding: Secondary | ICD-10-CM

## 2022-02-24 DIAGNOSIS — G43109 Migraine with aura, not intractable, without status migrainosus: Secondary | ICD-10-CM | POA: Diagnosis not present

## 2022-02-24 DIAGNOSIS — E782 Mixed hyperlipidemia: Secondary | ICD-10-CM

## 2022-02-24 DIAGNOSIS — F5101 Primary insomnia: Secondary | ICD-10-CM

## 2022-02-24 DIAGNOSIS — I110 Hypertensive heart disease with heart failure: Secondary | ICD-10-CM

## 2022-02-24 MED ORDER — VENLAFAXINE HCL ER 75 MG PO CP24: 75.0000 mg | ORAL_CAPSULE | Freq: Every day | ORAL | 3 refills | Status: DC

## 2022-02-24 MED ORDER — CARVEDILOL 25 MG PO TABS: 25.0000 mg | ORAL_TABLET | Freq: Two times a day (BID) | ORAL | 3 refills | Status: DC

## 2022-02-24 MED ORDER — ROSUVASTATIN CALCIUM 10 MG PO TABS: 10.0000 mg | ORAL_TABLET | Freq: Every day | ORAL | 3 refills | Status: DC

## 2022-02-24 MED ORDER — AMLODIPINE BESYLATE 10 MG PO TABS: 10.0000 mg | ORAL_TABLET | Freq: Every day | ORAL | 3 refills | Status: DC

## 2022-02-24 MED ORDER — ESOMEPRAZOLE MAGNESIUM 40 MG PO CPDR: 40.0000 mg | DELAYED_RELEASE_CAPSULE | Freq: Two times a day (BID) | ORAL | 3 refills | Status: DC

## 2022-02-24 NOTE — Patient Instructions (Signed)
[name] , Thank you for taking time to come for your Medicare Wellness Visit. I appreciate your ongoing commitment to your health goals. Please review the following plan we discussed and let me know if I can assist you in the future.   These are the goals we discussed:  Goals      DIET - EAT MORE FRUITS AND VEGETABLES     Have 3 meals a day        This is a list of the screening recommended for you and due dates:  Health Maintenance  Topic Date Due   Zoster (Shingles) Vaccine (1 of 2) Never done   COVID-19 Vaccine (4 - 2023-24 season) 09/02/2021   DTaP/Tdap/Td vaccine (4 - Td or Tdap) 09/12/2022   Medicare Annual Wellness Visit  02/25/2023   Colon Cancer Screening  09/28/2030   Pneumonia Vaccine  Completed   Flu Shot  Completed   DEXA scan (bone density measurement)  Completed   Hepatitis C Screening: USPSTF Recommendation to screen - Ages 76-79 yo.  Completed   HPV Vaccine  Aged Out    Advanced directives: no  Conditions/risks identified: none  Next appointment: Follow up in one year for your annual wellness visit 03/02/23 @ 8:55 am in person   Preventive Care 65 Years and Older, Female Preventive care refers to lifestyle choices and visits with your health care provider that can promote health and wellness. What does preventive care include? A yearly physical exam. This is also called an annual well check. Dental exams once or twice a year. Routine eye exams. Ask your health care provider how often you should have your eyes checked. Personal lifestyle choices, including: Daily care of your teeth and gums. Regular physical activity. Eating a healthy diet. Avoiding tobacco and drug use. Limiting alcohol use. Practicing safe sex. Taking low-dose aspirin every day. Taking vitamin and mineral supplements as recommended by your health care provider. What happens during an annual well check? The services and screenings done by your health care provider during your  annual well check will depend on your age, overall health, lifestyle risk factors, and family history of disease. Counseling  Your health care provider may ask you questions about your: Alcohol use. Tobacco use. Drug use. Emotional well-being. Home and relationship well-being. Sexual activity. Eating habits. History of falls. Memory and ability to understand (cognition). Work and work Statistician. Reproductive health. Screening  You may have the following tests or measurements: Height, weight, and BMI. Blood pressure. Lipid and cholesterol levels. These may be checked every 5 years, or more frequently if you are over 47 years old. Skin check. Lung cancer screening. You may have this screening every year starting at age 57 if you have a 30-pack-year history of smoking and currently smoke or have quit within the past 15 years. Fecal occult blood test (FOBT) of the stool. You may have this test every year starting at age 81. Flexible sigmoidoscopy or colonoscopy. You may have a sigmoidoscopy every 5 years or a colonoscopy every 10 years starting at age 63. Hepatitis C blood test. Hepatitis B blood test. Sexually transmitted disease (STD) testing. Diabetes screening. This is done by checking your blood sugar (glucose) after you have not eaten for a while (fasting). You may have this done every 1-3 years. Bone density scan. This is done to screen for osteoporosis. You may have this done starting at age 38. Mammogram. This may be done every 1-2 years. Talk to your health care provider about  how often you should have regular mammograms. Talk with your health care provider about your test results, treatment options, and if necessary, the need for more tests. Vaccines  Your health care provider may recommend certain vaccines, such as: Influenza vaccine. This is recommended every year. Tetanus, diphtheria, and acellular pertussis (Tdap, Td) vaccine. You may need a Td booster every 10  years. Zoster vaccine. You may need this after age 21. Pneumococcal 13-valent conjugate (PCV13) vaccine. One dose is recommended after age 2. Pneumococcal polysaccharide (PPSV23) vaccine. One dose is recommended after age 59. Talk to your health care provider about which screenings and vaccines you need and how often you need them. This information is not intended to replace advice given to you by your health care provider. Make sure you discuss any questions you have with your health care provider. Document Released: 01/15/2015 Document Revised: 09/08/2015 Document Reviewed: 10/20/2014 Elsevier Interactive Patient Education  2017 [place] Prevention in the Home Falls can cause injuries. They can happen to people of all ages. There are many things you can do to make your home safe and to help prevent falls. What can I do on the outside of my home? Regularly fix the edges of walkways and driveways and fix any cracks. Remove anything that might make you trip as you walk through a door, such as a raised step or threshold. Trim any bushes or trees on the path to your home. Use bright outdoor lighting. Clear any walking paths of anything that might make someone trip, such as rocks or tools. Regularly check to see if handrails are loose or broken. Make sure that both sides of any steps have handrails. Any raised decks and porches should have guardrails on the edges. Have any leaves, snow, or ice cleared regularly. Use sand or salt on walking paths during winter. Clean up any spills in your garage right away. This includes oil or grease spills. What can I do in the bathroom? Use night lights. Install grab bars by the toilet and in the tub and shower. Do not use towel bars as grab bars. Use non-skid mats or decals in the tub or shower. If you need to sit down in the shower, use a plastic, non-slip stool. Keep the floor dry. Clean up any water that spills on the floor as soon as it  happens. Remove soap buildup in the tub or shower regularly. Attach bath mats securely with double-sided non-slip rug tape. Do not have throw rugs and other things on the floor that can make you trip. What can I do in the bedroom? Use night lights. Make sure that you have a light by your bed that is easy to reach. Do not use any sheets or blankets that are too big for your bed. They should not hang down onto the floor. Have a firm chair that has side arms. You can use this for support while you get dressed. Do not have throw rugs and other things on the floor that can make you trip. What can I do in the kitchen? Clean up any spills right away. Avoid walking on wet floors. Keep items that you use a lot in easy-to-reach places. If you need to reach something above you, use a strong step stool that has a grab bar. Keep electrical cords out of the way. Do not use floor polish or wax that makes floors slippery. If you must use wax, use non-skid floor wax. Do not have throw rugs and other  things on the floor that can make you trip. What can I do with my stairs? Do not leave any items on the stairs. Make sure that there are handrails on both sides of the stairs and use them. Fix handrails that are broken or loose. Make sure that handrails are as long as the stairways. Check any carpeting to make sure that it is firmly attached to the stairs. Fix any carpet that is loose or worn. Avoid having throw rugs at the top or bottom of the stairs. If you do have throw rugs, attach them to the floor with carpet tape. Make sure that you have a light switch at the top of the stairs and the bottom of the stairs. If you do not have them, ask someone to add them for you. What else can I do to help prevent falls? Wear shoes that: Do not have high heels. Have rubber bottoms. Are comfortable and fit you well. Are closed at the toe. Do not wear sandals. If you use a stepladder: Make sure that it is fully opened.  Do not climb a closed stepladder. Make sure that both sides of the stepladder are locked into place. Ask someone to hold it for you, if possible. Clearly mark and make sure that you can see: Any grab bars or handrails. First and last steps. Where the edge of each step is. Use tools that help you move around (mobility aids) if they are needed. These include: Canes. Walkers. Scooters. Crutches. Turn on the lights when you go into a dark area. Replace any light bulbs as soon as they burn out. Set up your furniture so you have a clear path. Avoid moving your furniture around. If any of your floors are uneven, fix them. If there are any pets around you, be aware of where they are. Review your medicines with your doctor. Some medicines can make you feel dizzy. This can increase your chance of falling. Ask your doctor what other things that you can do to help prevent falls. This information is not intended to replace advice given to you by your health care provider. Make sure you discuss any questions you have with your health care provider. Document Released: 10/15/2008 Document Revised: 05/27/2015 Document Reviewed: 01/23/2014 Elsevier Interactive Patient Education  2017 [employer].

## 2022-02-24 NOTE — Progress Notes (Signed)
Subjective:   Amber Brooks is a 77 y.o. female who presents for Medicare Annual (Subsequent) preventive examination.  Review of Systems     Cardiac Risk Factors include: advanced age (>70mn, >>67women)     Objective:    Today's Vitals   02/24/22 0844  BP: 110/64  Pulse: 67  Weight: 112 lb (50.8 kg)  Height: '5\' 2"'$  (1.575 m)   Body mass index is 20.49 kg/m.     02/24/2022    8:50 AM 09/27/2020    9:46 AM 07/15/2019   11:38 AM 06/15/2016    5:15 PM 04/26/2014   11:34 PM 04/26/2014   10:41 AM 04/06/2014   11:27 PM  Advanced Directives  Does Patient Have a Medical Advance Directive? No No No No No No No  Would patient like information on creating a medical advance directive? No - Patient declined No - Patient declined   Yes - Educational materials given No - patient declined information Yes - Educational materials given    Current Medications (verified) Outpatient Encounter Medications as of 02/24/2022  Medication Sig   amLODipine (NORVASC) 10 MG tablet Take 1 tablet (10 mg total) by mouth daily.   carvedilol (COREG) 25 MG tablet Take 1 tablet (25 mg total) by mouth 2 (two) times daily.   EPINEPHrine 0.3 mg/0.3 mL IJ SOAJ injection Inject into the muscle once as needed.   esomeprazole (NEXIUM) 40 MG capsule Take 1 capsule (40 mg total) by mouth 2 (two) times daily before a meal.   fluticasone (FLONASE) 50 MCG/ACT nasal spray Place 2 sprays into both nostrils daily.   Multiple Vitamin (MULTIVITAMIN WITH MINERALS) TABS tablet Take 1 tablet by mouth daily.   NON FORMULARY Take 1 capsule by mouth daily. Tru Niagen   Omega 3 1000 MG CAPS Take 1,000 mg by mouth daily.   Probiotic Product (PROBIOTIC-10 PO) Take 1 capsule by mouth daily.   rosuvastatin (CRESTOR) 10 MG tablet Take 1 tablet (10 mg total) by mouth at bedtime.   traZODone (DESYREL) 50 MG tablet TAKE 1 TABLET BY MOUTH AT BEDTIME   venlafaxine XR (EFFEXOR-XR) 75 MG 24 hr capsule Take 1 capsule (75 mg total) by mouth  daily with breakfast.   vitamin B-12 (CYANOCOBALAMIN) 1000 MCG tablet Take 1,000 mcg by mouth daily.   vitamin C (ASCORBIC ACID) 500 MG tablet Take 500 mg by mouth daily.   [DISCONTINUED] diphenhydrAMINE (BENADRYL) 25 MG tablet Take 25 mg by mouth daily as needed for allergies.   [DISCONTINUED] ezetimibe-simvastatin (VYTORIN) 10-40 MG per tablet Take 1 tablet by mouth at bedtime.    No facility-administered encounter medications on file as of 02/24/2022.    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   Brain aneurysm    Complication of anesthesia    Depression    GERD (gastroesophageal reflux disease)    Headache(784.0)    migraines   History of chicken pox    Hyperlipidemia    Hypertension    Mitral valve prolapse    PONV (postoperative nausea and vomiting)    Shingles    Past Surgical History:  Procedure Laterality Date   ABDOMINAL HYSTERECTOMY  1987   ANEURYSM COILING     CHOLECYSTECTOMY  2018   COLONOSCOPY N/A 09/27/2020   Procedure: COLONOSCOPY;  Surgeon: RAnnamaria Helling DO;  [facility];  Service: Gastroenterology;  Laterality: N/A;   CRANIOTOMY Left 11/15/2012  Procedure: CRANIOTOMY INTRACRANIAL ANEURYSM FOR CAROTID;  Surgeon: Winfield Cunas, MD;  Location: [facility];  Service: Neurosurgery;  Laterality: Left;  LEFT Craniotomy for Aneurysm   ECTOPIC PREGNANCY SURGERY     ESOPHAGOGASTRODUODENOSCOPY N/A 09/27/2020   Procedure: ESOPHAGOGASTRODUODENOSCOPY (EGD);  Surgeon: Annamaria Helling, DO;  Location: [facility];  Service: Gastroenterology;  Laterality: N/A;   KNEE ARTHROSCOPY     LUMBAR LAMINECTOMY/DECOMPRESSION MICRODISCECTOMY N/A 04/27/2014   Procedure: LUMBAR LAMINECTOMY/DECOMPRESSION MICRODISCECTOMY LUMBAR FOUR-FIVE ;  Surgeon: Kristeen Miss, MD;  Location: [facility];  Service: Neurosurgery;  Laterality: N/A;   RADIOLOGY WITH ANESTHESIA  N/A 10/09/2012   Procedure: RADIOLOGY WITH ANESTHESIA;  Surgeon: Rob Hickman, MD;  Location: [facility];  Service: Radiology;  Laterality: N/A;   RENAL ANGIOGRAPHY N/A 03/09/2020   Procedure: RENAL ANGIOGRAPHY;  Surgeon: Katha Cabal, MD;  Location: [facility];  Service: Cardiovascular;  Laterality: N/A;   TONSILLECTOMY     VASCULAR SURGERY     stent placed   Family History  Problem Relation Age of Onset   Stroke Father    Heart disease Father    Dementia Father    Breast cancer Cousin    Dementia Brother    Social History   Socioeconomic History   Marital status: Divorced    Spouse name: Not on file   Number of children: 1   Years of education: collerge   Highest education level: Not on file  Occupational History   Occupation: [employer]  Tobacco Use   Smoking status: Never   Smokeless tobacco: Never  Vaping Use   Vaping Use: Never used  Substance and Sexual Activity   Alcohol use: Yes    Alcohol/week: 1.0 standard drink of alcohol    Types: 1 Glasses of wine per week    Comment: every evening   Drug use: No   Sexual activity: Not Currently  Other Topics Concern   Not on file  Social History Narrative   Right handed.  Caffeine decaf 1 cup in am.  Divorced.  1 adopted daughter.  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    Social Determinants of Health   Financial Resource Strain: Low Risk  (02/24/2022)   Overall Financial Resource Strain (CARDIA)    Difficulty of Paying Living Expenses: Not hard at all  Food Insecurity: No Food Insecurity (02/24/2022)   Hunger Vital Sign    Worried About Running Out of Food in the Last Year: Never true    Ran Out of Food in the Last Year: Never true  Transportation Needs: No Transportation Needs (02/24/2022)   PRAPARE - Hydrologist (Medical): No    Lack of Transportation (Non-Medical): No  Physical Activity: Insufficiently Active (02/24/2022)   Exercise Vital Sign    Days of Exercise per Week:  4 days    Minutes of Exercise per Session: 30 min  Stress: No Stress Concern Present (02/24/2022)   [place]    Feeling of Stress : Not at all  Social Connections: Moderately Isolated (02/24/2022)   Social Connection and Isolation Panel [NHANES]    Frequency of Communication with Friends and Family: More than three times a week    Frequency of Social Gatherings with Friends and Family: More than three times a week    Attends Religious Services: More than 4 times per year    Active Member of [employer] or Organizations: No    Attends Club or  Organization Meetings: Never    Marital Status: Divorced    Tobacco Counseling Counseling given: Not Answered   Clinical Intake:  Pre-visit preparation completed: Yes  Pain : No/denies pain     Nutritional Risks: None Diabetes: No  How often do you need to have someone help you when you read instructions, pamphlets, or other written materials from your doctor or pharmacy?: 1 - Never  Diabetic?no  Interpreter Needed?: No  Information entered by :: Kirke Shaggy, LPN   Activities of Daily Living    02/24/2022    8:51 AM  In your present state of health, do you have any difficulty performing the following activities:  Hearing? 0  Vision? 0  Difficulty concentrating or making decisions? 0  Walking or climbing stairs? 0  Dressing or bathing? 0  Doing errands, shopping? 0  Preparing Food and eating ? N  Using the Toilet? N  In the past six months, have you accidently leaked urine? N  Do you have problems with loss of bowel control? N  Managing your Medications? N  Managing your Finances? N  Housekeeping or managing your Housekeeping? N    Patient Care Team: Olin Hauser, DO as PCP - General (Family Medicine)  Indicate any recent Medical Services you may have received from other than [facility] providers in the past year (date may be approximate).      Assessment:   This is a routine wellness examination for Johnni.  Hearing/Vision screen Hearing Screening - Comments:: No aids Vision Screening - Comments:: Wears glasses- Dr. Ellin Mayhew  Dietary issues and exercise activities discussed: Current Exercise Habits: The patient has a physically strenuous job, but has no regular exercise apart from work., Type of exercise: walking, Time (Minutes): 30, Frequency (Times/Week): 4, Weekly Exercise (Minutes/Week): 120, Intensity: Mild   Goals Addressed             This Visit's Progress    DIET - EAT MORE FRUITS AND VEGETABLES         Depression Screen    02/24/2022    8:48 AM 02/21/2021    8:40 AM 02/21/2021    8:37 AM 02/10/2021   10:12 AM 11/05/2020    8:49 AM 06/22/2020    4:24 PM 12/18/2019   10:37 AM  PHQ 2/9 Scores  PHQ - 2 Score 1 0 0 0 0 0 0  PHQ- 9 Score '3 1 1 3 3 '$ 0 1    Fall Risk    02/24/2022    8:51 AM 02/21/2021    8:22 AM 02/10/2021   10:12 AM 06/22/2020    4:24 PM  Fall Risk   Falls in the past year? 1 0 0 0  Number falls in past yr: 0 0 0 0  Injury with Fall? 0 0 0 0  Risk for fall due to : History of fall(s) No Fall Risks No Fall Risks   Follow up Falls prevention discussed;Falls evaluation completed Falls evaluation completed Falls evaluation completed Falls evaluation completed    FALL RISK PREVENTION PERTAINING TO THE HOME:  Any stairs in or around the home? Yes  If so, are there any without handrails? No  Home free of loose throw rugs in walkways, pet beds, electrical cords, etc? Yes  Adequate lighting in your home to reduce risk of falls? Yes   ASSISTIVE DEVICES UTILIZED TO PREVENT FALLS:  Life alert? No  Use of a cane, walker or w/c? No  Grab bars in the bathroom? No  Shower chair  or bench in shower? No  Elevated toilet seat or a handicapped toilet? No   TIMED UP AND GO:  Was the test performed? Yes .  Length of time to ambulate 10 feet: 4 sec.   Gait steady and fast without use of assistive  device  Cognitive Function:    12/03/2013    8:55 AM  MMSE - Mini Mental State Exam  Orientation to time 5  Orientation to Place 5  Registration 3  Attention/ Calculation 4  Recall 2  Language- name 2 objects 2  Language- repeat 1  Language- follow 3 step command 3  Language- read & follow direction 1  Write a sentence 1  Copy design 0  Total score 27      12/03/2013    8:55 AM  Montreal Cognitive Assessment   Visuospatial/ Executive (0/5) 2  Naming (0/3) 3  Attention: Read list of digits (0/2) 2  Attention: Read list of letters (0/1) 1  Attention: Serial 7 subtraction starting at 100 (0/3) 3  Language: Repeat phrase (0/2) 1  Language : Fluency (0/1) 1  Abstraction (0/2) 2  Delayed Recall (0/5) 2  Orientation (0/6) 6  Total 23      02/24/2022    8:56 AM 02/21/2021    8:23 AM  6CIT Screen  What Year? 0 points 0 points  What month? 0 points 0 points  What time? 0 points 0 points  Count back from 20 0 points 0 points  Months in reverse 2 points 0 points  Repeat phrase 0 points 0 points  Total Score 2 points 0 points    Immunizations Immunization History  Administered Date(s) Administered   Fluad Quad(high Dose 65+) 11/05/2020, 10/13/2021   Influenza Split 11/22/2007, 12/28/2011, 09/16/2015   Influenza, High Dose Seasonal PF 10/06/2013, 11/09/2014, 10/20/2016, 08/31/2017   Influenza-Unspecified 09/11/2012, 09/12/2019   PFIZER(Purple Top)SARS-COV-2 Vaccination 02/16/2019, 03/18/2019, 10/10/2019   Pneumococcal Conjugate-13 10/06/2013   Pneumococcal Polysaccharide-23 06/28/2011, 09/16/2015   Td 06/02/2001   Td (Adult) 06/02/2001   Tdap 09/11/2012    TDAP status: Up to date  Flu Vaccine status: Up to date  Pneumococcal vaccine status: Up to date  Covid-19 vaccine status: Completed vaccines  Qualifies for Shingles Vaccine? Yes   Zostavax completed No   Shingrix Completed?: No.    Education has been provided regarding the importance of this vaccine.  Patient has been advised to call insurance company to determine out of pocket expense if they have not yet received this vaccine. Advised may also receive vaccine at local pharmacy or Health Dept. Verbalized acceptance and understanding.  Screening Tests Health Maintenance  Topic Date Due   Zoster Vaccines- Shingrix (1 of 2) Never done   COVID-19 Vaccine (4 - 2023-24 season) 09/02/2021   DTaP/Tdap/Td (4 - Td or Tdap) 09/12/2022   Medicare Annual Wellness (AWV)  02/25/2023   COLONOSCOPY (Pts 45-10yr Insurance coverage will need to be confirmed)  09/28/2030   Pneumonia Vaccine 76 Years old  Completed   INFLUENZA VACCINE  Completed   DEXA SCAN  Completed   Hepatitis C Screening  Completed   HPV VACCINES  Aged Out    Health Maintenance  Health Maintenance Due  Topic Date Due   Zoster Vaccines- Shingrix (1 of 2) Never done   COVID-19 Vaccine (4 - 2023-24 season) 09/02/2021   Had RSV shot  Colorectal cancer screening: No longer required.   Mammogram status: No longer required due to age.  Bone Density status: Completed 04/12/21. Results reflect:  Bone density results: OSTEOPENIA. Repeat every 5 years.  Lung Cancer Screening: (Low Dose CT Chest recommended if Age 54-80 years, 30 pack-year currently smoking OR have quit w/in 15years.) does not qualify.   Additional Screening:  Hepatitis C Screening: does qualify; Completed 02/14/21  Vision Screening: Recommended annual ophthalmology exams for early detection of glaucoma and other disorders of the eye. Is the patient up to date with their annual eye exam?  Yes  Who is the provider or what is the name of the office in which the patient attends annual eye exams? Dr.Woodard If pt is not established with a provider, would they like to be referred to a provider to establish care? No .   Dental Screening: Recommended annual dental exams for proper oral hygiene  Community Resource Referral / Chronic Care Management: CRR required this  visit?  No   CCM required this visit?  No      Plan:     I have personally reviewed and noted the following in the patient's chart:   Medical and social history Use of alcohol, tobacco or illicit drugs  Current medications and supplements including opioid prescriptions. Patient is not currently taking opioid prescriptions. Functional ability and status Nutritional status Physical activity Advanced directives List of other physicians Hospitalizations, surgeries, and ER visits in previous 12 months Vitals Screenings to include cognitive, depression, and falls Referrals and appointments  In addition, I have reviewed and discussed with patient certain preventive protocols, quality metrics, and best practice recommendations. A written personalized care plan for preventive services as well as general preventive health recommendations were provided to patient.     Dionisio David, LPN   [id number]   Nurse Notes: none

## 2022-02-24 NOTE — Progress Notes (Unsigned)
Subjective:    Patient ID: Amber Brooks, female    DOB: 17-Dec-1945, 77 y.o.   MRN: [id number]  Amber Brooks is a 77 y.o. female presenting on 02/24/2022 for Annual Exam   HPI  Here for Annual Physical and Lab Review.   A1c 5.3 normal range Fasting CBG 102   HYPERLIPIDEMIA: - Reports no concerns. Last lipid panel 2023, controlled LDL 70s - Currently taking Rosuvastatin '10mg'$  nightly, tolerating well without side effects or myalgias   UTI S/p Cholecystectomy in 2018 Chronic Diarrhea episodic Hx UTI in past trigger from diarrhea from chronic digestive problems. Takes OTC AZO She takes Total Restore for gut health supplement. Denies any hematuria, odor   Intermittent palpitations Syncopal Episode Chest Tightness Persistent issues following 10/2019 COVID Booster vaccine She is followed by [facility] now.   CHRONIC HTN: Reports history of elevated BP had been controlled Current Meds - Amlodipine '10mg'$  daily, Coreg '25mg'$  BID  Reports good compliance, took meds today. Tolerating well, w/o complaints. 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 She had remote history years ago about taking too many Xanax pills - Admits insomnia issue with waking up overnight in past, she has tried MIrtazapine and then recently Trazodone, difficulty maintaining sleep, she will wake up overnight then difficulty falling back asleep  Previous meds tried MIrtazapine, Ambien, Quviviq    History of Low Back Pain S/p surgery Lumbar Laminectomy decompression 04/2014 Dr Ellene Route She has done PT in past about 2-3 years ago She still has episodes of R lower back pain.      Health Maintenance: UTD COVID Vaccine needs copy of booster.  Declines Shingrix  UTD Pneumonia vaccine  Colonoscopy  completed 09/2020, good for 10 years.   Mammogram - 04/2021 negative. Repeat every other year. 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   Brain aneurysm    Complication of anesthesia    Depression    GERD (gastroesophageal reflux disease)    Headache(784.0)    migraines   History of chicken pox    Hyperlipidemia    Hypertension    Mitral valve prolapse    PONV (postoperative nausea and vomiting)    Shingles    Past Surgical History:  Procedure Laterality Date   [place]  2018   COLONOSCOPY N/A 09/27/2020   Procedure: COLONOSCOPY;  Surgeon: Annamaria Helling, DO;  Location: [facility]  ENDOSCOPY;  Service: Gastroenterology;  Laterality: N/A;   CRANIOTOMY Left 11/15/2012  Procedure: CRANIOTOMY INTRACRANIAL ANEURYSM FOR CAROTID;  Surgeon: Winfield Cunas, MD;  Location: [place] NEURO ORS;  Service: Neurosurgery;  Laterality: Left;  LEFT Craniotomy for Aneurysm   ECTOPIC PREGNANCY SURGERY     ESOPHAGOGASTRODUODENOSCOPY N/A 09/27/2020   Procedure: ESOPHAGOGASTRODUODENOSCOPY (EGD);  Surgeon: Annamaria Helling, DO;  Location: [facility] ENDOSCOPY;  Service: Gastroenterology;  Laterality: N/A;   KNEE ARTHROSCOPY     LUMBAR LAMINECTOMY/DECOMPRESSION MICRODISCECTOMY N/A 04/27/2014   Procedure: LUMBAR LAMINECTOMY/DECOMPRESSION MICRODISCECTOMY LUMBAR FOUR-FIVE ;  Surgeon: Kristeen Miss, MD;  Location: [place] NEURO ORS;  Service: Neurosurgery;  Laterality: N/A;   RADIOLOGY WITH ANESTHESIA N/A 10/09/2012   Procedure: RADIOLOGY WITH ANESTHESIA;  Surgeon: Rob Hickman, MD;  Location: [place];  Service: Radiology;  Laterality: N/A;   RENAL ANGIOGRAPHY N/A 03/09/2020   Procedure: RENAL ANGIOGRAPHY;  Surgeon: Katha Cabal, MD;  Location: [place] CV LAB;  Service: Cardiovascular;  Laterality: N/A;   TONSILLECTOMY     VASCULAR SURGERY     stent placed   Social History   Socioeconomic History   Marital status: Divorced    Spouse name: Not on file   Number of children: 1   Years of education: collerge   Highest education level: Not on file  Occupational History   Occupation: [employer]  Tobacco Use   Smoking status: Never   Smokeless tobacco: Never  Vaping Use   Vaping Use: Never used  Substance and Sexual Activity   Alcohol use: Yes    Alcohol/week: 1.0 standard drink of alcohol    Types: 1 Glasses of wine per week    Comment: every evening   Drug use: No   Sexual activity: Not Currently  Other Topics Concern   Not on file  Social History Narrative   Right handed.  Caffeine decaf 1 cup in am.  Divorced.  1 adopted daughter.  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    Social Determinants of Health   Financial Resource Strain: Low Risk  (02/21/2021)   Overall Financial Resource Strain (CARDIA)    Difficulty of Paying Living Expenses: Not hard at all  Food Insecurity: No Food Insecurity (02/21/2021)   Hunger Vital Sign    Worried About Running Out of Food in the Last Year: Never true    Ran Out of Food in the Last Year: Never true  Transportation Needs: No Transportation Needs (02/21/2021)   PRAPARE - Hydrologist (Medical): No    Lack of Transportation (Non-Medical): No  Physical Activity: Insufficiently Active (02/21/2021)   Exercise Vital Sign    Days of Exercise per Week: 4 days    Minutes of Exercise per Session: 30 min   Stress: No Stress Concern Present (02/21/2021)   [place]    Feeling of Stress : Only a little  Social Connections: Moderately Isolated (02/21/2021)   Social Connection and Isolation Panel [NHANES]    Frequency of Communication with Friends and Family: More than three times a week    Frequency of Social Gatherings with Friends and Family: More than three times a week    Attends Religious Services: 1 to 4 times per year    Active Member of [employer] or Organizations: No    Attends Archivist Meetings: Never    Marital Status: Divorced  Human resources officer Violence: Not on file   Family History  Problem Relation Age of Onset   Stroke Father    Heart disease Father  Dementia Father    Breast cancer Cousin    Dementia Brother    Current Outpatient Medications on File Prior to Visit  Medication Sig   EPINEPHrine 0.3 mg/0.3 mL IJ SOAJ injection Inject into the muscle once as needed.   fluticasone (FLONASE) 50 MCG/ACT nasal spray Place 2 sprays into both nostrils daily.   Multiple Vitamin (MULTIVITAMIN WITH MINERALS) TABS tablet Take 1 tablet by mouth daily.   NON FORMULARY Take 1 capsule by mouth daily. Tru Niagen   Omega 3 1000 MG CAPS Take 1,000 mg by mouth daily.   Probiotic Product (PROBIOTIC-10 PO) Take 1 capsule by mouth daily.   traZODone (DESYREL) 50 MG tablet TAKE 1 TABLET BY MOUTH AT BEDTIME   vitamin B-12 (CYANOCOBALAMIN) 1000 MCG tablet Take 1,000 mcg by mouth daily.   vitamin C (ASCORBIC ACID) 500 MG tablet Take 500 mg by mouth daily.   [DISCONTINUED] diphenhydrAMINE (BENADRYL) 25 MG tablet Take 25 mg by mouth daily as needed for allergies.   [DISCONTINUED] ezetimibe-simvastatin (VYTORIN) 10-40 MG per tablet Take 1 tablet by mouth at bedtime.    No current facility-administered medications on file prior to visit.    Review of Systems Per HPI unless specifically indicated above      Objective:     BP 110/64   Pulse 67   Ht '5\' 2"'$  (1.575 m)   Wt 112 lb (50.8 kg)   SpO2 95%   BMI 20.49 kg/m   Wt Readings from Last 3 Encounters:  02/24/22 112 lb (50.8 kg)  12/01/21 112 lb (50.8 kg)  10/13/21 112 lb (50.8 kg)    Physical Exam Vitals and nursing note reviewed.  Constitutional:      General: She is not in acute distress.    Appearance: She is well-developed. She is not diaphoretic.     Comments: Well-appearing, comfortable, cooperative  HENT:     Head: Normocephalic and atraumatic.  Eyes:     General:        Right eye: No discharge.        Left eye: No discharge.     Conjunctiva/sclera: Conjunctivae normal.     Pupils: Pupils are equal, round, and reactive to light.  Neck:     Thyroid: No thyromegaly.  Cardiovascular:     Rate and Rhythm: Normal rate and regular rhythm.     Pulses: Normal pulses.     Heart sounds: Normal heart sounds. No murmur heard. Pulmonary:     Effort: Pulmonary effort is normal. No respiratory distress.     Breath sounds: Normal breath sounds. No wheezing or rales.  Abdominal:     General: Bowel sounds are normal. There is no distension.     Palpations: Abdomen is soft. There is no mass.     Tenderness: There is no abdominal tenderness.  Musculoskeletal:        General: No tenderness. Normal range of motion.     Cervical back: Normal range of motion and neck supple.     Comments: Upper / Lower Extremities: - Normal muscle tone, strength bilateral upper extremities 5/5, lower extremities 5/5  Lymphadenopathy:     Cervical: No cervical adenopathy.  Skin:    General: Skin is warm and dry.     Findings: No erythema or rash.  Neurological:     Mental Status: She is alert and oriented to person, place, and time.     Comments: Distal sensation intact to light touch all extremities  Psychiatric:  Mood and Affect: Mood normal.        Behavior: Behavior normal.        Thought Content: Thought content normal.     Comments: Well groomed, good  eye contact, normal speech and thoughts    Results for orders placed or performed in visit on 02/20/22  HgB A1c  Result Value Ref Range   Hgb A1c MFr Bld 5.4 <5.7 % of total Hgb   Mean Plasma Glucose 108 mg/dL   eAG (mmol/L) 6.0 mmol/L  CBC with Differential  Result Value Ref Range   WBC 9.8 3.8 - 10.8 Thousand/uL   RBC 4.37 3.80 - 5.10 Million/uL   Hemoglobin 13.7 11.7 - 15.5 g/dL   HCT 40.5 35.0 - 45.0 %   MCV 92.7 80.0 - 100.0 fL   MCH 31.4 27.0 - 33.0 pg   MCHC 33.8 32.0 - 36.0 g/dL   RDW 11.8 11.0 - 15.0 %   Platelets 263 140 - 400 Thousand/uL   MPV 11.2 7.5 - 12.5 fL   Neutro Abs 7,438 1,500 - 7,800 cells/uL   Lymphs Abs 1,343 850 - 3,900 cells/uL   Absolute Monocytes 686 200 - 950 cells/uL   Eosinophils Absolute 294 15 - 500 cells/uL   Basophils Absolute 39 0 - 200 cells/uL   Neutrophils Relative % 75.9 %   Total Lymphocyte 13.7 %   Monocytes Relative 7.0 %   Eosinophils Relative 3.0 %   Basophils Relative 0.4 %  Comprehensive Metabolic Panel (CMET)  Result Value Ref Range   Glucose, Bld 94 65 - 99 mg/dL   BUN 26 (H) 7 - 25 mg/dL   Creat 0.78 0.60 - 1.00 mg/dL   BUN/Creatinine Ratio 33 (H) 6 - 22 (calc)   Sodium 141 135 - 146 mmol/L   Potassium 4.9 3.5 - 5.3 mmol/L   Chloride 104 98 - 110 mmol/L   CO2 28 20 - 32 mmol/L   Calcium 9.7 8.6 - 10.4 mg/dL   Total Protein 6.8 6.1 - 8.1 g/dL   Albumin 4.6 3.6 - 5.1 g/dL   Globulin 2.2 1.9 - 3.7 g/dL (calc)   AG Ratio 2.1 1.0 - 2.5 (calc)   Total Bilirubin 0.4 0.2 - 1.2 mg/dL   Alkaline phosphatase (APISO) 61 37 - 153 U/L   AST 21 10 - 35 U/L   ALT 20 6 - 29 U/L  Lipid panel  Result Value Ref Range   Cholesterol 166 <200 mg/dL   HDL 85 > OR = 50 mg/dL   Triglycerides 77 <150 mg/dL   LDL Cholesterol (Calc) 65 mg/dL (calc)   Total CHOL/HDL Ratio 2.0 <5.0 (calc)   Non-HDL Cholesterol (Calc) 81 <130 mg/dL (calc)  TSH  Result Value Ref Range   TSH 3.29 0.40 - 4.50 mIU/L      Assessment & Plan:   Problem List  Items Addressed This Visit   None Visit Diagnoses     Annual physical exam    -  Primary       No orders of the defined types were placed in this encounter.   Reduced Effexor down from 150 to '75mg'$  daily, take in evening. New order sent to [facility] In future can gradually wean down if you prefer. May take weeks to months.  Trazodone '50mg'$  can be doubled, to take 2 = '100mg'$  at night. If this works, I can re order the '100mg'$  in one pill. - Max dose is '150mg'$  if truly needed.  All rx ordered to  [facility] Except Nexium sent to [facility]  All lab results are excellent. Mild elevated BUN, which is related to hydration.  Vaccines updated overall.    Follow up plan: Return in about 1 year (around 02/25/2023) for 1 year fasting lab only then 1 week later Annual Physical.  Future labs 02/26/23   Nobie Putnam, [facility] 02/24/2022, 8:19 AM

## 2022-02-24 NOTE — Patient Instructions (Addendum)
Thank you for coming to the office today.  Reduced Effexor down from 150 to '75mg'$  daily, take in evening. New order sent to [facility] In future can gradually wean down if you prefer. May take weeks to months.  Trazodone '50mg'$  can be doubled, to take 2 = '100mg'$  at night. If this works, I can re order the '100mg'$  in one pill. - Max dose is '150mg'$  if truly needed.  All rx ordered to [facility] Except Nexium sent to [facility]  All lab results are excellent. Mild elevated BUN, which is related to hydration.  Vaccines updated overall.  DUE for FASTING BLOOD WORK (no food or drink after midnight before the lab appointment, only water or coffee without cream/sugar on the morning of)  SCHEDULE "Lab Only" visit in the morning at the clinic for lab draw in 1 YEAR  - Make sure Lab Only appointment is at about 1 week before your next appointment, so that results will be available  For Lab Results, once available within 2-3 days of blood draw, you can can log in to MyChart online to view your results and a brief explanation. Also, we can discuss results at next follow-up visit.    Please schedule a Follow-up Appointment to: Return in about 1 year (around 02/25/2023) for 1 year fasting lab only then 1 week later Annual Physical.  If you have any other questions or concerns, please feel free to call the office or send a message through [facility]. You may also schedule an earlier appointment if necessary.  Additionally, you may be receiving a survey about your experience at our office within a few days to 1 week by e-mail or mail. We value your feedback.  Nobie Putnam, DO [place]

## 2022-02-25 ENCOUNTER — Other Ambulatory Visit: Payer: Self-pay | Admitting: Family Medicine

## 2022-02-25 DIAGNOSIS — F3342 Major depressive disorder, recurrent, in full remission: Secondary | ICD-10-CM

## 2022-02-25 DIAGNOSIS — F5101 Primary insomnia: Secondary | ICD-10-CM

## 2022-02-25 DIAGNOSIS — E782 Mixed hyperlipidemia: Secondary | ICD-10-CM

## 2022-02-25 DIAGNOSIS — R7309 Other abnormal glucose: Secondary | ICD-10-CM

## 2022-02-25 DIAGNOSIS — I110 Hypertensive heart disease with heart failure: Secondary | ICD-10-CM

## 2022-02-25 DIAGNOSIS — I10 Essential (primary) hypertension: Secondary | ICD-10-CM

## 2022-02-25 DIAGNOSIS — Z0000 Encounter for general adult medical examination without abnormal findings: Secondary | ICD-10-CM

## 2022-03-07 ENCOUNTER — Other Ambulatory Visit: Payer: Self-pay

## 2022-03-07 ENCOUNTER — Encounter: Payer: Self-pay | Admitting: Family Medicine

## 2022-03-07 DIAGNOSIS — R519 Headache, unspecified: Secondary | ICD-10-CM

## 2022-03-07 DIAGNOSIS — J301 Allergic rhinitis due to pollen: Secondary | ICD-10-CM

## 2022-03-07 MED ORDER — FLUTICASONE PROPIONATE 50 MCG/ACT NA SUSP: 2.0000 | Freq: Every day | NASAL | 3 refills | Status: DC

## 2022-03-07 MED ORDER — IBUPROFEN 800 MG PO TABS: ORAL_TABLET | ORAL | 0 refills | Status: DC

## 2022-03-18 ENCOUNTER — Other Ambulatory Visit: Payer: Self-pay | Admitting: Family Medicine

## 2022-03-18 DIAGNOSIS — F5101 Primary insomnia: Secondary | ICD-10-CM

## 2022-03-19 ENCOUNTER — Encounter (INDEPENDENT_AMBULATORY_CARE_PROVIDER_SITE_OTHER): Payer: Medicare Other | Admitting: Family Medicine

## 2022-03-19 DIAGNOSIS — J301 Allergic rhinitis due to pollen: Secondary | ICD-10-CM | POA: Diagnosis not present

## 2022-03-20 MED ORDER — LEVOCETIRIZINE DIHYDROCHLORIDE 5 MG PO TABS: 5.0000 mg | ORAL_TABLET | Freq: Every evening | ORAL | 3 refills | Status: DC

## 2022-03-20 MED ORDER — MONTELUKAST SODIUM 10 MG PO TABS: 10.0000 mg | ORAL_TABLET | Freq: Every day | ORAL | 3 refills | Status: DC

## 2022-03-20 NOTE — Telephone Encounter (Signed)
Refilled 03/09/22 #90 with 3 refills.

## 2022-03-20 NOTE — Telephone Encounter (Signed)
Please see the MyChart message reply(ies) for my assessment and plan.    This patient gave consent for this Medical Advice Message and is aware that it may result in a bill to their insurance company, as well as the possibility of receiving a bill for a co-payment or deductible. They are an established patient, but are not seeking medical advice exclusively about a problem treated during an in person or video visit in the last seven days. I did not recommend an in person or video visit within seven days of my reply.    I spent a total of 8 minutes cumulative time within 7 days through MyChart messaging.  Aaliya Maultsby, DO   

## 2022-03-20 NOTE — Addendum Note (Signed)
Addended by: Olin Hauser on: 03/20/2022 05:41 PM   Modules accepted: Orders

## 2022-03-23 ENCOUNTER — Ambulatory Visit (INDEPENDENT_AMBULATORY_CARE_PROVIDER_SITE_OTHER): Payer: PPO | Admitting: Vascular Surgery

## 2022-03-23 ENCOUNTER — Encounter (INDEPENDENT_AMBULATORY_CARE_PROVIDER_SITE_OTHER): Payer: PPO

## 2022-03-27 ENCOUNTER — Other Ambulatory Visit: Payer: Self-pay | Admitting: Family Medicine

## 2022-03-27 DIAGNOSIS — R519 Headache, unspecified: Secondary | ICD-10-CM

## 2022-03-28 NOTE — Telephone Encounter (Signed)
Requested Prescriptions  Pending Prescriptions Disp Refills   ibuprofen (ADVIL) 800 MG tablet [Pharmacy Med Name: Ibuprofen 800 MG Oral Tablet] 90 tablet 2    Sig: TAKE 1 TABLET BY MOUTH EVERY 8  HOURS AS NEEDED FOR HEADACHE     Analgesics:  NSAIDS Failed - 03/27/2022  8:21 AM      Failed - Manual Review: Labs are only required if the patient has taken medication for more than 8 weeks.      Failed - eGFR is 30 or above and within 360 days    GFR calc Af Amer  Date Value Ref Range Status  01/12/2020 90 >59 mL/min/1.73 Final    Comment:    **In accordance with recommendations from the NKF-ASN Task force,**   [facility] is in the process of updating its eGFR calculation to the   2021 CKD-EPI creatinine equation that estimates kidney function   without a race variable.    GFR, Estimated  Date Value Ref Range Status  03/09/2020 >60 >60 mL/min Final    Comment:    (NOTE) Calculated using the CKD-EPI Creatinine Equation (2021) Performed at [facility], [address] [place], [place] [address]    eGFR  Date Value Ref Range Status  02/14/2021 83 > OR = 60 mL/min/1.37m2 Final    Comment:    The eGFR is based on the CKD-EPI 2021 equation. To calculate  the new eGFR from a previous Creatinine or Cystatin C result, go to https://www.kidney.org/professionals/ kdoqi/gfr%5Fcalculator          Passed - Cr in normal range and within 360 days    Creat  Date Value Ref Range Status  02/20/2022 0.78 0.60 - 1.00 mg/dL Final         Passed - HGB in normal range and within 360 days    Hemoglobin  Date Value Ref Range Status  02/20/2022 13.7 11.7 - 15.5 g/dL Final         Passed - PLT in normal range and within 360 days    Platelets  Date Value Ref Range Status  02/20/2022 263 140 - 400 Thousand/uL Final         Passed - HCT in normal range and within 360 days    HCT  Date Value Ref Range Status  02/20/2022 40.5 35.0 - 45.0 % Final         Passed - Patient is not  pregnant      Passed - Valid encounter within last 12 months    Recent Outpatient Visits           1 month ago Annual physical exam   [place], DO   3 months ago [place], DO   7 months ago [facility] Mecum, [place], [place]   7 months ago Viral sinusitis   [facility] Croom, Coralie Keens, NP   9 months ago Skin induration   [facility] [place], Devonne Doughty, DO       Future Appointments             In 3 days [place], Devonne Doughty, [facility], [place]   In 11 months [facility], [facility]

## 2022-03-30 ENCOUNTER — Ambulatory Visit (INDEPENDENT_AMBULATORY_CARE_PROVIDER_SITE_OTHER): Payer: PPO | Admitting: Vascular Surgery

## 2022-03-30 ENCOUNTER — Encounter (INDEPENDENT_AMBULATORY_CARE_PROVIDER_SITE_OTHER): Payer: PPO

## 2022-03-31 ENCOUNTER — Encounter: Payer: Self-pay | Admitting: Family Medicine

## 2022-03-31 ENCOUNTER — Ambulatory Visit: Payer: Medicare Other | Admitting: Family Medicine

## 2022-03-31 ENCOUNTER — Ambulatory Visit (INDEPENDENT_AMBULATORY_CARE_PROVIDER_SITE_OTHER): Payer: Medicare Other | Admitting: Family Medicine

## 2022-03-31 VITALS — BP 122/70 | HR 74 | Ht 62.0 in | Wt 111.0 lb

## 2022-03-31 DIAGNOSIS — F3342 Major depressive disorder, recurrent, in full remission: Secondary | ICD-10-CM | POA: Diagnosis not present

## 2022-03-31 DIAGNOSIS — R0789 Other chest pain: Secondary | ICD-10-CM | POA: Diagnosis not present

## 2022-03-31 MED ORDER — VENLAFAXINE HCL ER 150 MG PO CP24: 150.0000 mg | ORAL_CAPSULE | Freq: Every day | ORAL | 3 refills | Status: DC

## 2022-03-31 MED ORDER — ASPIRIN 81 MG PO TBEC: 81.0000 mg | DELAYED_RELEASE_TABLET | Freq: Every day | ORAL | 12 refills | Status: DC

## 2022-03-31 NOTE — Progress Notes (Signed)
Subjective:    Patient ID: Amber Brooks, female    DOB: Nov 08, 1945, 77 y.o.   MRN: [id number]  Amber Brooks is a 77 y.o. female presenting on 03/31/2022 for chest discomfort   HPI  Atypical Chest Pain History of Chest Pain Prior work up [facility] Cardiology 02/2020 previously had work up - copied to chart here: Echo 01/2020 showed normal systolic function, impaired relaxation, 55 to 60%.  Coronary CT showed a calcium score of 168, mild calcified plaque mid LAD, 25 to 49% stenosis.   Now here with 2 weeks w/ some persistent moderate to severe chest and back pain with some radiating symptoms. Admits feeling "sick" but not nausea or vomiting She had one episode of diarrhea - now resolved She took Ibuprofen recently with some relief significantly Not endorsing any indigestion or burning Unrelated to food or drink or breathing or exertion  Denies any dyspnea, fever chills cough, nausea vomiting  Follows w Vascular yearly for renal artery stenosis  Major depression recurrent in remission On Effexor previously 150 > 75 last visit, but she did not do well on 75, she now doubles 75 mg x 2 = 150mg       03/31/2022    5:25 PM 02/24/2022    8:48 AM 02/21/2021    8:40 AM  Depression screen PHQ 2/9  Decreased Interest 1  0  Down, Depressed, Hopeless 1 1 0  PHQ - 2 Score 2 1 0  Altered sleeping 1 1 1   Tired, decreased energy 1 1 0  Change in appetite 1  0  Feeling bad or failure about yourself  0  0  Trouble concentrating 0  0  Moving slowly or fidgety/restless 0  0  Suicidal thoughts 0  0  PHQ-9 Score 5 3 1   Difficult doing work/chores Not difficult at all  Not difficult at all    Social History   Tobacco Use   Smoking status: Never   Smokeless tobacco: Never  Vaping Use   Vaping Use: Never used  Substance Use Topics   Alcohol use: Yes    Alcohol/week: 1.0 standard drink of alcohol    Types: 1 Glasses of wine per week    Comment: every evening   Drug use: No    Review  of Systems Per HPI unless specifically indicated above     Objective:    BP 122/70   Pulse 74   Ht 5\' 2"  (1.575 m)   Wt 111 lb (50.3 kg)   SpO2 100%   BMI 20.30 kg/m   Wt Readings from Last 3 Encounters:  03/31/22 111 lb (50.3 kg)  02/24/22 112 lb (50.8 kg)  02/24/22 112 lb (50.8 kg)    Physical Exam Vitals and nursing note reviewed.  Constitutional:      General: She is not in acute distress.    Appearance: She is well-developed. She is not diaphoretic.     Comments: Well-appearing, comfortable, cooperative  HENT:     Head: Normocephalic and atraumatic.  Eyes:     General:        Right eye: No discharge.        Left eye: No discharge.     Conjunctiva/sclera: Conjunctivae normal.  Neck:     Thyroid: No thyromegaly.  Cardiovascular:     Rate and Rhythm: Normal rate and regular rhythm.     Heart sounds: Normal heart sounds. No murmur heard. Pulmonary:     Effort: Pulmonary effort is normal. No respiratory  distress.     Breath sounds: Normal breath sounds. No wheezing or rales.  Chest:     Chest wall: No tenderness.  Abdominal:     General: There is no distension.     Palpations: Abdomen is soft. There is no mass.     Tenderness: There is no abdominal tenderness. There is no guarding.     Hernia: No hernia is present.     Comments: Hyperactive bowel sound. Localized pain to epigastric region  Musculoskeletal:        General: Normal range of motion.     Cervical back: Normal range of motion and neck supple.     Right lower leg: No edema.     Left lower leg: No edema.  Lymphadenopathy:     Cervical: No cervical adenopathy.  Skin:    General: Skin is warm and dry.     Findings: No erythema or rash.  Neurological:     Mental Status: She is alert and oriented to person, place, and time.  Psychiatric:        Behavior: Behavior normal.     Comments: Well groomed, good eye contact, normal speech and thoughts    EKG - performed in office today  Date: 03/31/22   Rate: 74  Rhythm: normal sinus rhythm  QRS Axis: normal  Intervals: normal  ST/T Wave abnormalities: nonspecific ST/T changes  Conduction Disutrbances:first-degree A-V block   Additional Narrative Interpretation:    Old EKG Reviewed: unchanged 01/22/20    Results for orders placed or performed in visit on 02/20/22  HgB A1c  Result Value Ref Range   Hgb A1c MFr Bld 5.4 <5.7 % of total Hgb   Mean Plasma Glucose 108 mg/dL   eAG (mmol/L) 6.0 mmol/L  CBC with Differential  Result Value Ref Range   WBC 9.8 3.8 - 10.8 Thousand/uL   RBC 4.37 3.80 - 5.10 Million/uL   Hemoglobin 13.7 11.7 - 15.5 g/dL   HCT 40.5 35.0 - 45.0 %   MCV 92.7 80.0 - 100.0 fL   MCH 31.4 27.0 - 33.0 pg   MCHC 33.8 32.0 - 36.0 g/dL   RDW 11.8 11.0 - 15.0 %   Platelets 263 140 - 400 Thousand/uL   MPV 11.2 7.5 - 12.5 fL   Neutro Abs 7,438 1,500 - 7,800 cells/uL   Lymphs Abs 1,343 850 - 3,900 cells/uL   Absolute Monocytes 686 200 - 950 cells/uL   Eosinophils Absolute 294 15 - 500 cells/uL   Basophils Absolute 39 0 - 200 cells/uL   Neutrophils Relative % 75.9 %   Total Lymphocyte 13.7 %   Monocytes Relative 7.0 %   Eosinophils Relative 3.0 %   Basophils Relative 0.4 %  Comprehensive Metabolic Panel (CMET)  Result Value Ref Range   Glucose, Bld 94 65 - 99 mg/dL   BUN 26 (H) 7 - 25 mg/dL   Creat 0.78 0.60 - 1.00 mg/dL   BUN/Creatinine Ratio 33 (H) 6 - 22 (calc)   Sodium 141 135 - 146 mmol/L   Potassium 4.9 3.5 - 5.3 mmol/L   Chloride 104 98 - 110 mmol/L   CO2 28 20 - 32 mmol/L   Calcium 9.7 8.6 - 10.4 mg/dL   Total Protein 6.8 6.1 - 8.1 g/dL   Albumin 4.6 3.6 - 5.1 g/dL   Globulin 2.2 1.9 - 3.7 g/dL (calc)   AG Ratio 2.1 1.0 - 2.5 (calc)   Total Bilirubin 0.4 0.2 - 1.2 mg/dL   Alkaline phosphatase (  APISO) 61 37 - 153 U/L   AST 21 10 - 35 U/L   ALT 20 6 - 29 U/L  Lipid panel  Result Value Ref Range   Cholesterol 166 <200 mg/dL   HDL 85 > OR = 50 mg/dL   Triglycerides 77 <150 mg/dL   LDL Cholesterol  (Calc) 65 mg/dL (calc)   Total CHOL/HDL Ratio 2.0 <5.0 (calc)   Non-HDL Cholesterol (Calc) 81 <130 mg/dL (calc)  TSH  Result Value Ref Range   TSH 3.29 0.40 - 4.50 mIU/L      Assessment & Plan:   Problem List Items Addressed This Visit     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)   Relevant Medications   venlafaxine XR (EFFEXOR-XR) 150 MG 24 hr capsule   Other Visit Diagnoses     Atypical chest pain    -  Primary   Relevant Orders   EKG 12-Lead       Atypical Chest Pain vs Epigastric Pain Episodic today brief episode while here in office No associated other symptoms No GI symptoms or red flags Improved w/ ibuprofen vs spontaneous relief Not related to exertion at all No breathing symptoms associated No recent illness  I advised her that she may pursue evaluation at [facility] ED today for full work up with lab testing, imaging EKG, she declines at this time. Her symptoms are stable and without worsening. She prefers to follow up outpatient with Cardiology.  She is hemodynamically stable with normal vitals today and is very comfortable without pain.  STAT EKG done today. Reviewed results. See above. Unchanged from last EKG on file in 2022. No acute changes from prior.  Reviewed chart, previously chest pain issues seen by Cardiology in 02/2020, has had extensive work up, had some mild stenosis identified on imaging. She has followed Vascular specialist for renal artery stenosis.  She did not return to follow up with Cardiology as anticipated 6 months since last apt. Also did not start baby Aspirin 81mg   Today advised, start Aspirin 81mg  daily, already on statin, and BB among other meds  Return to Cardiology - call them next week.  [facility]) HeartCare at [address], [place] [address] Main: [phone]   If you have any significant chest pain that does not go away within 30 minutes, is accompanied  by nausea, sweating, shortness of breath, or made worse by activity, this may be evidence of a heart attack, especially if symptoms worsening instead of improving, please call 911 or go directly to the emergency room immediately for evaluation.   Major depression recurrent Previously attempted to reduce Venlafaxine from 150 to 75mg  this was unsuccessful, she is now doubling 75mg  x 2 Dose increase venlafaxine 150mg  now, re order  Meds ordered this encounter  Medications   venlafaxine XR (EFFEXOR-XR) 150 MG 24 hr capsule    Sig: Take 1 capsule (150 mg total) by mouth daily with breakfast.    Dispense:  90 capsule    Refill:  3    Dose increase from 75 to 150mg    aspirin EC 81 MG tablet    Sig: Take 1 tablet (81 mg total) by mouth daily. Swallow whole.    Dispense:  30 tablet    Refill:  12      Follow up plan: Return if symptoms worsen or fail to improve.   Nobie Putnam, [facility] 03/31/2022, 4:01 PM

## 2022-03-31 NOTE — Patient Instructions (Addendum)
Thank you for coming to the office today.  Dose increase venlafaxine 150mg  now, discontinue 75mg  or double it  If pain improves can take the ibuprofen as needed.  Start Aspirin 81mg  daily  Return to Cardiology - call them next week.  [place] [facility]) HeartCare at [place] [address], [place] [address] Main: [phone]   If you have any significant chest pain that does not go away within 30 minutes, is accompanied by nausea, sweating, shortness of breath, or made worse by activity, this may be evidence of a heart attack, especially if symptoms worsening instead of improving, please call 911 or go directly to the emergency room immediately for evaluation.   Please schedule a Follow-up Appointment to: Return if symptoms worsen or fail to improve.  If you have any other questions or concerns, please feel free to call the office or send a message through [place]. You may also schedule an earlier appointment if necessary.  Additionally, you may be receiving a survey about your experience at our office within a few days to 1 week by e-mail or mail. We value your feedback.  Nobie Putnam, DO [place]

## 2022-04-04 ENCOUNTER — Encounter: Payer: Self-pay | Admitting: Family Medicine

## 2022-04-05 ENCOUNTER — Other Ambulatory Visit: Payer: Self-pay

## 2022-04-05 DIAGNOSIS — F5101 Primary insomnia: Secondary | ICD-10-CM

## 2022-04-05 DIAGNOSIS — K2100 Gastro-esophageal reflux disease with esophagitis, without bleeding: Secondary | ICD-10-CM

## 2022-04-05 MED ORDER — TRAZODONE HCL 50 MG PO TABS: 50.0000 mg | ORAL_TABLET | Freq: Every day | ORAL | 3 refills | Status: DC

## 2022-04-14 DIAGNOSIS — H04123 Dry eye syndrome of bilateral lacrimal glands: Secondary | ICD-10-CM | POA: Diagnosis not present

## 2022-04-14 DIAGNOSIS — H26493 Other secondary cataract, bilateral: Secondary | ICD-10-CM | POA: Diagnosis not present

## 2022-04-14 DIAGNOSIS — H1045 Other chronic allergic conjunctivitis: Secondary | ICD-10-CM | POA: Diagnosis not present

## 2022-04-14 DIAGNOSIS — H5203 Hypermetropia, bilateral: Secondary | ICD-10-CM | POA: Diagnosis not present

## 2022-05-01 ENCOUNTER — Ambulatory Visit (INDEPENDENT_AMBULATORY_CARE_PROVIDER_SITE_OTHER): Payer: PPO | Admitting: Vascular Surgery

## 2022-05-01 ENCOUNTER — Encounter (INDEPENDENT_AMBULATORY_CARE_PROVIDER_SITE_OTHER): Payer: PPO

## 2022-05-02 ENCOUNTER — Other Ambulatory Visit (INDEPENDENT_AMBULATORY_CARE_PROVIDER_SITE_OTHER): Payer: Self-pay | Admitting: Vascular Surgery

## 2022-05-02 DIAGNOSIS — I701 Atherosclerosis of renal artery: Secondary | ICD-10-CM

## 2022-05-05 ENCOUNTER — Encounter (INDEPENDENT_AMBULATORY_CARE_PROVIDER_SITE_OTHER): Payer: Self-pay | Admitting: Nurse Practitioner

## 2022-05-05 ENCOUNTER — Ambulatory Visit (INDEPENDENT_AMBULATORY_CARE_PROVIDER_SITE_OTHER): Payer: Medicare Other

## 2022-05-05 ENCOUNTER — Ambulatory Visit (INDEPENDENT_AMBULATORY_CARE_PROVIDER_SITE_OTHER): Payer: Medicare Other | Admitting: Nurse Practitioner

## 2022-05-05 VITALS — BP 138/72 | HR 66 | Resp 17 | Ht 62.0 in | Wt 118.6 lb

## 2022-05-05 DIAGNOSIS — E782 Mixed hyperlipidemia: Secondary | ICD-10-CM | POA: Diagnosis not present

## 2022-05-05 DIAGNOSIS — H04123 Dry eye syndrome of bilateral lacrimal glands: Secondary | ICD-10-CM | POA: Diagnosis not present

## 2022-05-05 DIAGNOSIS — H01005 Unspecified blepharitis left lower eyelid: Secondary | ICD-10-CM | POA: Diagnosis not present

## 2022-05-05 DIAGNOSIS — H01002 Unspecified blepharitis right lower eyelid: Secondary | ICD-10-CM | POA: Diagnosis not present

## 2022-05-05 DIAGNOSIS — H1045 Other chronic allergic conjunctivitis: Secondary | ICD-10-CM | POA: Diagnosis not present

## 2022-05-05 DIAGNOSIS — H02882 Meibomian gland dysfunction right lower eyelid: Secondary | ICD-10-CM | POA: Diagnosis not present

## 2022-05-05 DIAGNOSIS — H02885 Meibomian gland dysfunction left lower eyelid: Secondary | ICD-10-CM | POA: Diagnosis not present

## 2022-05-05 DIAGNOSIS — I10 Essential (primary) hypertension: Secondary | ICD-10-CM

## 2022-05-05 DIAGNOSIS — I701 Atherosclerosis of renal artery: Secondary | ICD-10-CM

## 2022-05-05 NOTE — Progress Notes (Signed)
Subjective:    Patient ID: Amber Brooks, female    DOB: [birth year], 77 y.o.   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   Brain aneurysm    Complication of anesthesia    Depression    GERD (gastroesophageal reflux disease)    Headache(784.0)    migraines   History of chicken pox    Hyperlipidemia    Hypertension    Mitral valve prolapse    PONV (postoperative nausea and vomiting)    Shingles     Social History   Socioeconomic History   Marital status: Divorced    Spouse name: Not on file   Number of children: 1   Years of education: College   Highest education level: Associate degree: academic program  Occupational History   Occupation: [employer]  Tobacco Use   Smoking status: Never   Smokeless tobacco: Never  Vaping Use   Vaping Use: Never used  Substance and Sexual Activity   Alcohol use: Yes    Alcohol/week: 1.0 standard drink of alcohol    Types: 1 Glasses of wine per week    Comment: every evening   Drug use: No   Sexual activity: Not Currently  Other Topics Concern   Not on file  Social History Narrative   Right handed.  Caffeine decaf 1 cup in am.  Divorced.  1 adopted daughter.  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    Social Determinants of Health   Financial Resource Strain: Low Risk  (03/28/2022)   Overall Financial Resource Strain (CARDIA)    Difficulty of Paying Living Expenses: Not very hard  Food Insecurity: No Food Insecurity (03/28/2022)   Hunger Vital Sign    Worried About Running Out of Food in the Last Year: Never true    Ran Out of Food in the Last Year: Never true  Transportation Needs: No Transportation Needs (03/28/2022)   PRAPARE - Therapist, art (Medical): No    Lack of Transportation (Non-Medical): No  Physical Activity: Insufficiently Active (03/28/2022)   Exercise Vital Sign    Days of Exercise per Week: 3 days    Minutes of Exercise per Session: 30 min  Stress: No Stress Concern Present (03/28/2022)   [employer] of Occupational Health - Occupational Stress Questionnaire    Feeling of Stress : Only a little  Social Connections: Moderately Integrated (03/28/2022)   Social Connection and Isolation Panel [NHANES]    Frequency of Communication with Friends and Family: Three times a week    Frequency of Social Gatherings with Friends and Family: Once a week    Attends Religious Services: More than 4 times per year    Active Member of [employer] or Organizations: Yes    Attends Banker Meetings: Never    Marital Status: Divorced  Recent Concern: Social Connections - Moderately Isolated (02/24/2022)   Social Connection and Isolation Panel [NHANES]    Frequency of Communication with Friends and Family: More than three times a week    Frequency of Social Gatherings with Friends and  Family: More than three times a week    Attends Religious Services: More than 4 times per year    Active Member of Clubs or Organizations: No    Attends Banker Meetings: Never    Marital Status: Divorced  Catering manager Violence: Not At Risk (02/24/2022)   Humiliation, Afraid, Rape, and Kick questionnaire    Fear of Current or Ex-Partner: No    Emotionally Abused: No    Physically Abused: No    Sexually Abused: No    Past Surgical History:  Procedure Laterality Date   ABDOMINAL HYSTERECTOMY  1987   ANEURYSM COILING     CHOLECYSTECTOMY  2018   COLONOSCOPY N/A 09/27/2020   Procedure: COLONOSCOPY;  Surgeon: Jaynie Collins, DO;  Location: [facility] ENDOSCOPY;  Service: Gastroenterology;  Laterality: N/A;   CRANIOTOMY Left 11/15/2012   Procedure: CRANIOTOMY INTRACRANIAL ANEURYSM FOR CAROTID;   Surgeon: Carmela Hurt, MD;  Location: [facility] NEURO ORS;  Service: Neurosurgery;  Laterality: Left;  LEFT Craniotomy for Aneurysm   ECTOPIC PREGNANCY SURGERY     ESOPHAGOGASTRODUODENOSCOPY N/A 09/27/2020   Procedure: ESOPHAGOGASTRODUODENOSCOPY (EGD);  Surgeon: Jaynie Collins, DO;  Location[facility] ENDOSCOPY;  Service: Gastroenterology;  Laterality: N/A;   KNEE ARTHROSCOPY     LUMBAR LAMINECTOMY/DECOMPRESSION MICRODISCECTOMY N/A 04/27/2014   Procedure: LUMBAR LAMINECTOMY/DECOMPRESSION MICRODISCECTOMY LUMBAR FOUR-FIVE ;  Surgeon: Barnett Abu, MD;  Location: [facility] NEURO ORS;  Service: Neurosurgery;  Laterality: N/A;   RADIOLOGY WITH ANESTHESIA N/A 10/09/2012   Procedure: RADIOLOGY WITH ANESTHESIA;  Surgeon: Oneal Grout, MD;  Location: [facility] OR;  Service: Radiology;  Laterality: N/A;   RENAL ANGIOGRAPHY N/A 03/09/2020   Procedure: RENAL ANGIOGRAPHY;  Surgeon: Renford Dills, MD;  Location: [facility] INVASIVE CV LAB;  Service: Cardiovascular;  Laterality: N/A;   TONSILLECTOMY     VASCULAR SURGERY     stent placed    Family History  Problem Relation Age of Onset   Stroke Father    Heart disease Father    Dementia Father    Breast cancer Cousin    Dementia Brother     Allergies  Allergen Reactions   Latex Anaphylaxis   Atorvastatin Other (See Comments)    Aches and hair loss   Clonidine Derivatives Cough   Hydromorphone Itching   Lisinopril Cough   Silenor [Doxepin] Other (See Comments)    jittery       Latest Ref Rng & Units 02/20/2022    8:25 AM 02/14/2021    8:11 AM 02/11/2020    8:52 AM  CBC  WBC 3.8 - 10.8 Thousand/uL 9.8  6.0  7.3   Hemoglobin 11.7 - 15.5 g/dL 16.1  09.6  04.5   Hematocrit 35.0 - 45.0 % 40.5  42.8  40.6   Platelets 140 - 400 Thousand/uL 263  256  267       CMP     Component Value Date/Time   NA 141 02/20/2022 0825   NA 144 01/12/2020 1438   K 4.9 02/20/2022 0825   CL 104 02/20/2022 0825   CO2 28 02/20/2022 0825   GLUCOSE 94 02/20/2022 0825    BUN 26 (H) 02/20/2022 0825   BUN 15 01/12/2020 1438   CREATININE 0.78 02/20/2022 0825   CALCIUM 9.7 02/20/2022 0825   PROT 6.8 02/20/2022 0825   ALBUMIN 4.4 06/15/2016 1711   AST 21 02/20/2022 0825   ALT 20 02/20/2022 0825   ALKPHOS 71 06/15/2016 1711   BILITOT 0.4 02/20/2022 0825   GFRNONAA >60 03/09/2020  1351   GFRAA 90 01/12/2020 1438     No results found.     Assessment & Plan:   1. Atherosclerosis of renal artery (HCC) Recommend:  Given patient's arterial disease optimal control of the patient's hypertension is important. BP is acceptable today  The patient's vital signs and noninvasive studies support the renal artery stenosis is not significantly increased when compared to the previous study.  No invasive studies or intervention is indicated at this time.  The patient will continue the current antihypertensive medications, no changes at this time.  The primary medical service will continue aggressive antihypertensive therapy as per the AHA guidelines Recommend:  Given patient's arterial disease optimal control of the patient's hypertension is important. BP is acceptable today  The patient's vital signs and noninvasive studies support the renal artery stenosis is not significantly increased when compared to the previous study.  No invasive studies or intervention is indicated at this time.  The patient will continue the current antihypertensive medications, no changes at this time.  The primary medical service will continue aggressive antihypertensive therapy as per the AHA guidelines   Because her studies have been stable for the last several years, we will plan on having her follow-up in 24 months to repeat noninvasive studies.  2. Primary hypertension Continue antihypertensive medications as already ordered, these medications have been reviewed and there are no changes at this time.   3. Mixed hyperlipidemia Continue statin as ordered and reviewed, no  changes at this time   Current Outpatient Medications on File Prior to Visit  Medication Sig Dispense Refill   amLODipine (NORVASC) 10 MG tablet Take 1 tablet (10 mg total) by mouth daily. 90 tablet 3   aspirin EC 81 MG tablet Take 1 tablet (81 mg total) by mouth daily. Swallow whole. 30 tablet 12   carvedilol (COREG) 25 MG tablet Take 1 tablet (25 mg total) by mouth 2 (two) times daily. 180 tablet 3   EPINEPHrine 0.3 mg/0.3 mL IJ SOAJ injection Inject into the muscle once as needed.     esomeprazole (NEXIUM) 40 MG capsule Take 1 capsule (40 mg total) by mouth 2 (two) times daily before a meal. 180 capsule 3   fluticasone (FLONASE) 50 MCG/ACT nasal spray Place 2 sprays into both nostrils daily. 48 mL 3   ibuprofen (ADVIL) 800 MG tablet TAKE 1 TABLET BY MOUTH EVERY 8  HOURS AS NEEDED FOR HEADACHE 90 tablet 2   levocetirizine (XYZAL) 5 MG tablet Take 1 tablet (5 mg total) by mouth every evening. 90 tablet 3   montelukast (SINGULAIR) 10 MG tablet Take 1 tablet (10 mg total) by mouth at bedtime. 90 tablet 3   Multiple Vitamin (MULTIVITAMIN WITH MINERALS) TABS tablet Take 1 tablet by mouth daily.     Omega 3 1000 MG CAPS Take 1,000 mg by mouth daily.     Probiotic Product (PROBIOTIC-10 PO) Take 1 capsule by mouth daily.     rosuvastatin (CRESTOR) 10 MG tablet Take 1 tablet (10 mg total) by mouth at bedtime. 90 tablet 3   traZODone (DESYREL) 50 MG tablet Take 1 tablet (50 mg total) by mouth at bedtime. 90 tablet 3   venlafaxine XR (EFFEXOR-XR) 150 MG 24 hr capsule Take 1 capsule (150 mg total) by mouth daily with breakfast. 90 capsule 3   vitamin B-12 (CYANOCOBALAMIN) 1000 MCG tablet Take 1,000 mcg by mouth daily.     vitamin C (ASCORBIC ACID) 500 MG tablet Take 500 mg by mouth daily.     [  DISCONTINUED] diphenhydrAMINE (BENADRYL) 25 MG tablet Take 25 mg by mouth daily as needed for allergies.     [DISCONTINUED] ezetimibe-simvastatin (VYTORIN) 10-40 MG per tablet Take 1 tablet by mouth at bedtime.       No current facility-administered medications on file prior to visit.    There are no Patient Instructions on file for this visit. No follow-ups on file.   Georgiana Spinner, NP

## 2022-05-09 DIAGNOSIS — S299XXA Unspecified injury of thorax, initial encounter: Secondary | ICD-10-CM | POA: Diagnosis not present

## 2022-05-09 DIAGNOSIS — R0781 Pleurodynia: Secondary | ICD-10-CM | POA: Diagnosis not present

## 2022-05-09 DIAGNOSIS — S2242XA Multiple fractures of ribs, left side, initial encounter for closed fracture: Secondary | ICD-10-CM | POA: Diagnosis not present

## 2022-05-24 ENCOUNTER — Other Ambulatory Visit: Payer: Self-pay | Admitting: Family Medicine

## 2022-05-24 ENCOUNTER — Encounter (INDEPENDENT_AMBULATORY_CARE_PROVIDER_SITE_OTHER): Payer: Medicare Other | Admitting: Family Medicine

## 2022-05-24 DIAGNOSIS — J301 Allergic rhinitis due to pollen: Secondary | ICD-10-CM

## 2022-05-24 DIAGNOSIS — K2100 Gastro-esophageal reflux disease with esophagitis, without bleeding: Secondary | ICD-10-CM

## 2022-05-24 DIAGNOSIS — R519 Headache, unspecified: Secondary | ICD-10-CM | POA: Diagnosis not present

## 2022-05-24 DIAGNOSIS — R0789 Other chest pain: Secondary | ICD-10-CM

## 2022-05-24 NOTE — Telephone Encounter (Signed)
Requested Prescriptions  Pending Prescriptions Disp Refills   ibuprofen (ADVIL) 800 MG tablet [Pharmacy Med Name: Ibuprofen 800 MG Oral Tablet] 270 tablet 3    Sig: TAKE 1 TABLET BY MOUTH EVERY 8  HOURS AS NEEDED FOR HEADACHE     Analgesics:  NSAIDS Failed - 05/24/2022  3:55 PM      Failed - Manual Review: Labs are only required if the patient has taken medication for more than 8 weeks.      Failed - eGFR is 30 or above and within 360 days    GFR calc Af Amer  Date Value Ref Range Status  01/12/2020 90 >59 mL/min/1.73 Final    Comment:    **In accordance with recommendations from the NKF-ASN Task force,**   [facility] is in the process of updating its eGFR calculation to the   2021 CKD-EPI creatinine equation that estimates kidney function   without a race variable.    GFR, Estimated  Date Value Ref Range Status  03/09/2020 >60 >60 mL/min Final    Comment:    (NOTE) Calculated using the CKD-EPI Creatinine Equation (2021) Performed at [facility], [address] [place], [place] [address]    eGFR  Date Value Ref Range Status  02/14/2021 83 > OR = 60 mL/min/1.34m2 Final    Comment:    The eGFR is based on the CKD-EPI 2021 equation. To calculate  the new eGFR from a previous Creatinine or Cystatin C result, go to https://www.kidney.org/professionals/ kdoqi/gfr%5Fcalculator          Passed - Cr in normal range and within 360 days    Creat  Date Value Ref Range Status  02/20/2022 0.78 0.60 - 1.00 mg/dL Final         Passed - HGB in normal range and within 360 days    Hemoglobin  Date Value Ref Range Status  02/20/2022 13.7 11.7 - 15.5 g/dL Final         Passed - PLT in normal range and within 360 days    Platelets  Date Value Ref Range Status  02/20/2022 263 140 - 400 Thousand/uL Final         Passed - HCT in normal range and within 360 days    HCT  Date Value Ref Range Status  02/20/2022 40.5 35.0 - 45.0 % Final         Passed - Patient is not  pregnant      Passed - Valid encounter within last 12 months    Recent Outpatient Visits           1 month ago Atypical chest pain   [place] [facility] He[place], [name], DO   2 months ago Annual physical exam   [place] [facility] [name], DO   5 months ago Viral URI   [place] [facility] [name], DO   9 months ago Dizziness   [place] [facility] M[place], [name], [place]   9 months ago Viral sinusitis   [place] [facility] [place], [name], NP       Future Appointments             In 9 months [name], [name], DO [facility], [facility]

## 2022-05-25 MED ORDER — IBUPROFEN 800 MG PO TABS: ORAL_TABLET | ORAL | 0 refills | Status: DC

## 2022-05-26 ENCOUNTER — Telehealth: Payer: Self-pay | Admitting: Internal Medicine

## 2022-05-26 NOTE — Telephone Encounter (Signed)
Ok for OV?

## 2022-05-26 NOTE — Telephone Encounter (Signed)
Patient did not want to schedule, stated she will find care elsewhere.

## 2022-05-26 NOTE — Telephone Encounter (Signed)
Hi Dr. Rhea Belton,    Supervising Provider PM 05/26/22  We received a referral for GERD for patient. She has history with [facility] and last was seen in 2022. She is requesting a Transfer of care due to feeling as though she has not been receiving the best care with previous provider and has not been happy. States she is highly concerned with the indigestion she is experiencing. Her records are in [facility] for you to review and advise on scheduling.    Thank you.

## 2022-06-01 MED ORDER — AZELASTINE HCL 0.1 % NA SOLN: 2.0000 | Freq: Two times a day (BID) | NASAL | 3 refills | Status: DC

## 2022-06-01 NOTE — Telephone Encounter (Signed)
Please see the MyChart message reply(ies) for my assessment and plan.    This patient gave consent for this Medical Advice Message and is aware that it may result in a bill to their insurance company, as well as the possibility of receiving a bill for a co-payment or deductible. They are an established patient, but are not seeking medical advice exclusively about a problem treated during an in person or video visit in the last seven days. I did not recommend an in person or video visit within seven days of my reply.    I spent a total of 10 minutes cumulative time within 7 days through MyChart messaging.  Neftali Abair, DO   

## 2022-06-01 NOTE — Addendum Note (Signed)
Addended by: [name] on: 06/01/2022 02:00 PM   Modules accepted: Orders

## 2022-06-13 MED ORDER — IBUPROFEN 800 MG PO TABS: ORAL_TABLET | ORAL | 3 refills | Status: DC

## 2022-06-13 NOTE — Addendum Note (Signed)
Addended by: [name] on: 06/13/2022 06:45 PM   Modules accepted: Orders

## 2022-08-22 ENCOUNTER — Other Ambulatory Visit: Payer: Self-pay | Admitting: Family Medicine

## 2022-08-22 DIAGNOSIS — R519 Headache, unspecified: Secondary | ICD-10-CM

## 2022-08-23 NOTE — Telephone Encounter (Signed)
Requested medication (s) are due for refill today: Yes  Requested medication (s) are on the active medication list: Yes  Last refill:  06/13/22  Future visit scheduled: Yes  Notes to clinic:  Pharmacy requesting 100-Day supply      Requested Prescriptions  Pending Prescriptions Disp Refills   ibuprofen (ADVIL) 800 MG tablet [Pharmacy Med Name: Ibuprofen 800 MG Oral Tablet] 300 tablet 2    Sig: TAKE 1 TABLET BY MOUTH EVERY 8  HOURS AS NEEDED FOR HEADACHE     Analgesics:  NSAIDS Failed - 08/22/2022  5:28 AM      Failed - Manual Review: Labs are only required if the patient has taken medication for more than 8 weeks.      Failed - eGFR is 30 or above and within 360 days    GFR calc Af Amer  Date Value Ref Range Status  01/12/2020 90 >59 mL/min/1.73 Final    Comment:    **In accordance with recommendations from the NKF-ASN Task force,**   [facility] is in the process of updating its eGFR calculation to the   2021 CKD-EPI creatinine equation that estimates kidney function   without a race variable.    GFR, Estimated  Date Value Ref Range Status  03/09/2020 >60 >60 mL/min Final    Comment:    (NOTE) Calculated using the CKD-EPI Creatinine Equation (2021) Performed at [facility], [address] [place], [place] [address]    eGFR  Date Value Ref Range Status  02/14/2021 83 > OR = 60 mL/min/1.21m2 Final    Comment:    The eGFR is based on the CKD-EPI 2021 equation. To calculate  the new eGFR from a previous Creatinine or Cystatin C result, go to https://www.kidney.org/professionals/ kdoqi/gfr%5Fcalculator          Passed - Cr in normal range and within 360 days    Creat  Date Value Ref Range Status  02/20/2022 0.78 0.60 - 1.00 mg/dL Final         Passed - HGB in normal range and within 360 days    Hemoglobin  Date Value Ref Range Status  02/20/2022 13.7 11.7 - 15.5 g/dL Final         Passed - PLT in normal range and within 360 days    Platelets  Date  Value Ref Range Status  02/20/2022 263 140 - 400 Thousand/uL Final         Passed - HCT in normal range and within 360 days    HCT  Date Value Ref Range Status  02/20/2022 40.5 35.0 - 45.0 % Final         Passed - Patient is not pregnant      Passed - Valid encounter within last 12 months    Recent Outpatient Visits           4 months ago Atypical chest pain   [place] [facility] [place], [name], DO   6 months ago Annual physical exam   [place] [facility] [name], DO   8 months ago Viral URI   [place] [facility] [name], DO   12 months ago Dizziness   [place] [facility]um, [name], [place]   1 year ago Viral sinusitis   [place] [facility], [name], NP       Future Appointments  In 6 months [place], [name], DO  [facility], [facility]

## 2022-10-25 ENCOUNTER — Other Ambulatory Visit: Payer: Self-pay | Admitting: Family Medicine

## 2022-10-25 DIAGNOSIS — J301 Allergic rhinitis due to pollen: Secondary | ICD-10-CM

## 2022-10-25 DIAGNOSIS — I10 Essential (primary) hypertension: Secondary | ICD-10-CM

## 2022-10-25 DIAGNOSIS — I110 Hypertensive heart disease with heart failure: Secondary | ICD-10-CM

## 2022-10-25 DIAGNOSIS — E782 Mixed hyperlipidemia: Secondary | ICD-10-CM

## 2022-10-26 NOTE — Telephone Encounter (Signed)
Requested Prescriptions  Pending Prescriptions Disp Refills   rosuvastatin (CRESTOR) 10 MG tablet [Pharmacy Med Name: Rosuvastatin Calcium 10 MG Oral Tablet] 100 tablet 0    Sig: TAKE 1 TABLET BY MOUTH AT  BEDTIME     Cardiovascular:  Antilipid - Statins 2 Failed - 10/25/2022  6:02 AM      Failed - Lipid Panel in normal range within the last 12 months    Cholesterol  Date Value Ref Range Status  02/20/2022 166 <200 mg/dL Final   LDL Cholesterol (Calc)  Date Value Ref Range Status  02/20/2022 65 mg/dL (calc) Final    Comment:    Reference range: <100 . Desirable range <100 mg/dL for primary prevention;   <70 mg/dL for patients with CHD or diabetic patients  with > or = 2 CHD risk factors. Marland Kitchen LDL-C is now calculated using the Martin-Hopkins  calculation, which is a validated novel method providing  better accuracy than the Friedewald equation in the  estimation of LDL-C.  Horald Pollen et al. Lenox Ahr. 6578;469(62): 2061-2068  (http://education.[facility].com/faq/FAQ164)    HDL  Date Value Ref Range Status  02/20/2022 85 > OR = 50 mg/dL Final   Triglycerides  Date Value Ref Range Status  02/20/2022 77 <150 mg/dL Final         Passed - Cr in normal range and within 360 days    Creat  Date Value Ref Range Status  02/20/2022 0.78 0.60 - 1.00 mg/dL Final         Passed - Patient is not pregnant      Passed - Valid encounter within last 12 months    Recent Outpatient Visits           6 months ago Atypical chest pain   [place] [facility], [name], DO   8 months ago Annual physical exam   [place] [facility] [name], DO   10 months ago Viral URI   [place] [facility] [name], DO   1 year ago Dizziness   [place] [facility]um, [name], [place]   1 year ago Viral sinusitis   [place] [facility] [place], [name], NP        Future Appointments             In 1 week [name], NP [facility] [place], [facility]   In 4 months [name], [name], DO [facility]             amLODipine (NORVASC) 10 MG tablet [Pharmacy Med Name: amLODIPine Besylate 10 MG Oral Tablet] 100 tablet     Sig: TAKE 1 TABLET BY MOUTH DAILY     Cardiovascular: Calcium Channel Blockers 2 Failed - 10/25/2022  6:02 AM      Failed - Valid encounter within last 6 months    Recent Outpatient Visits           6 months ago Atypical chest pain   [place] [facility], [name], DO   8 months ago Annual physical exam   [place] [facility] [name], DO   10 months ago Viral URI   [place] [facility] [place], [name], DO   1 year ago Dizziness   [place] [facility]um, [name], [place]  1 year ago Viral sinusitis   [place] [facility] [place], [name], NP       Future Appointments             In 1 week [name], NP [facility] [place], [place]   In 4 months [name], [name], DO [facility], [facility]            Passed - Last BP in normal range    BP Readings from Last 1 Encounters:  05/05/22 138/72         Passed - Last Heart Rate in normal range    Pulse Readings from Last 1 Encounters:  05/05/22 66          montelukast (SINGULAIR) 10 MG tablet [Pharmacy Med Name: Montelukast Sodium 10 MG Oral Tablet] 100 tablet 0    Sig: TAKE 1 TABLET BY MOUTH AT  BEDTIME     Pulmonology:  Leukotriene Inhibitors Passed - 10/25/2022  6:02 AM      Passed - Valid encounter within last 12 months    Recent Outpatient Visits           6 months ago Atypical chest pain   [place] [facility] [name], DO   8 months ago Annual physical exam    [place] [facility] [name], DO   10 months ago Viral URI   [place] [facility] [name], DO   1 year ago Dizziness   [place] [facility]ecum, [name], [place]   1 year ago Viral sinusitis   [place] [facility], [name], NP       Future Appointments             In 1 week [name], NP [place] [facility] [place], [facility]   In 4 months [name], [name], DO [place], [facility]             carvedilol (COREG) 25 MG tablet [Pharmacy Med Name: Carvedilol 25 MG Oral Tablet] 200 tablet 0    Sig: TAKE 1 TABLET BY MOUTH TWICE  DAILY     Cardiovascular: Beta Blockers 3 Failed - 10/25/2022  6:02 AM      Failed - Valid encounter within last 6 months    Recent Outpatient Visits           6 months ago Atypical chest pain   [place] [facility] [place], [name], DO   8 months ago Annual physical exam   [place] [facility] [name], DO   10 months ago Viral URI   [place] [facility], [name], DO   1 year ago Dizziness   [place] [facility] Mecum, [name], [place]   1 year ago Viral sinusitis   [place] [facility] [place], [name], NP       Future Appointments             In 1 week [name], NP [facility] [place], [facility]   In 4 months [name], [name], DO [place] [facility], [facility]            Passed - Cr in normal range and within 360 days  Creat  Date Value Ref Range Status  02/20/2022 0.78 0.60 - 1.00 mg/dL Final         Passed - AST in normal range and within 360 days    AST  Date Value Ref Range Status  02/20/2022 21 10 - 35 U/L Final         Passed - ALT in normal range and within 360 days    ALT   Date Value Ref Range Status  02/20/2022 20 6 - 29 U/L Final         Passed - Last BP in normal range    BP Readings from Last 1 Encounters:  05/05/22 138/72         Passed - Last Heart Rate in normal range    Pulse Readings from Last 1 Encounters:  05/05/22 66

## 2022-10-26 NOTE — Telephone Encounter (Signed)
Requested Prescriptions  Pending Prescriptions Disp Refills   amLODipine (NORVASC) 10 MG tablet [Pharmacy Med Name: amLODIPine Besylate 10 MG Oral Tablet] 100 tablet 0    Sig: TAKE 1 TABLET BY MOUTH DAILY     Cardiovascular: Calcium Channel Blockers 2 Failed - 10/25/2022  6:02 AM      Failed - Valid encounter within last 6 months    Recent Outpatient Visits           6 months ago Atypical chest pain   [place] [facility] [place], Amber Neat, DO   8 months ago Annual physical exam   [place] [facility] Cords, DO   10 months ago Viral URI   [place] [facility] Cords, DO   1 year ago Dizziness   [place] [facility] Mecum, Amber Brooks, [place]   1 year ago Viral sinusitis   [place] [facility] [place], Amber Oxford, NP       Future Appointments             In 1 week Amber Dicker, NP [place] [facility] [place], [facility]   In 4 months Amber Brooks, Amber Neat, DO [place] [facility], [facility]            Passed - Last BP in normal range    BP Readings from Last 1 Encounters:  05/05/22 138/72         Passed - Last Heart Rate in normal range    Pulse Readings from Last 1 Encounters:  05/05/22 66         Signed Prescriptions Disp Refills   rosuvastatin (CRESTOR) 10 MG tablet 100 tablet 0    Sig: TAKE 1 TABLET BY MOUTH AT  BEDTIME     Cardiovascular:  Antilipid - Statins 2 Failed - 10/25/2022  6:02 AM      Failed - Lipid Panel in normal range within the last 12 months    Cholesterol  Date Value Ref Range Status  02/20/2022 166 <200 mg/dL Final   LDL Cholesterol (Calc)  Date Value Ref Range Status  02/20/2022 65 mg/dL (calc) Final    Comment:    Reference range: <100 . Desirable range <100 mg/dL for primary prevention;   <70 mg/dL for patients with CHD or diabetic patients  with > or = 2 CHD risk  factors. Amber Brooks LDL-C is now calculated using the Martin-Hopkins  calculation, which is a validated novel method providing  better accuracy than the Friedewald equation in the  estimation of LDL-C.  Amber Brooks et al. Amber Brooks. 6045;409(81): 2061-2068  (http://education.[facility].com/faq/FAQ164)    HDL  Date Value Ref Range Status  02/20/2022 85 > OR = 50 mg/dL Final   Triglycerides  Date Value Ref Range Status  02/20/2022 77 <150 mg/dL Final         Passed - Cr in normal range and within 360 days    Creat  Date Value Ref Range Status  02/20/2022 0.78 0.60 - 1.00 mg/dL Final         Passed - Patient is not pregnant      Passed - Valid encounter within last 12 months    Recent Outpatient Visits           6 months ago Atypical chest pain   [place] [facility] [place], Amber Neat, DO   8 months ago Annual physical exam  [place] [facility] Amber Cords, DO   10 months ago Viral URI   [place] [facility] Cords, DO   1 year ago Dizziness   [place] [facility]ecum, Amber Brooks, [place]   1 year ago Viral sinusitis   [place] [facility] [place], Amber Oxford, NP       Future Appointments             In 1 week Amber Dicker, NP [facility] [place], [place]   In 4 months Amber Brooks, Amber Neat, DO [place] [facility]             montelukast (SINGULAIR) 10 MG tablet 100 tablet 0    Sig: TAKE 1 TABLET BY MOUTH AT  BEDTIME     Pulmonology:  Leukotriene Inhibitors Passed - 10/25/2022  6:02 AM      Passed - Valid encounter within last 12 months    Recent Outpatient Visits           6 months ago Atypical chest pain   [place] [facility], Amber Neat, DO   8 months ago Annual physical exam   [place] [facility] Amber Cords, DO    10 months ago Viral URI   [place] [facility] [place] Amber Cords, DO   1 year ago Dizziness   [place] [facility] Mecum, Amber Brooks, [place]   1 year ago Viral sinusitis   [place] [facility] Amber Brooks, Amber Oxford, NP       Future Appointments             In 1 week Amber Dicker, NP [place] [facility] [place], [place]   In 4 months Amber Brooks, Amber Neat, DO [place] [facility], [facility]             carvedilol (COREG) 25 MG tablet 200 tablet 0    Sig: TAKE 1 TABLET BY MOUTH TWICE  DAILY     Cardiovascular: Beta Blockers 3 Failed - 10/25/2022  6:02 AM      Failed - Valid encounter within last 6 months    Recent Outpatient Visits           6 months ago Atypical chest pain   [place] [facility] Cords, DO   8 months ago Annual physical exam   [place] [facility] Amber Cords, DO   10 months ago Viral URI   [place] [facility] Amber Cords, DO   1 year ago Dizziness   [place] [facility]ec[place], Amber Brooks, [place]   1 year ago Viral sinusitis   [place] [facility] [place], Amber Oxford, NP       Future Appointments             In 1 week Amber Dicker, NP [place] [facility] [place], [facility]   In 4 months Amber Brooks, Amber Neat, DO  [facility], [facility]            Passed - Cr in normal range and within 360 days    Creat  Date Value Ref Range Status  02/20/2022 0.78 0.60 - 1.00 mg/dL Final  Passed - AST in normal range and within 360 days    AST  Date Value Ref Range Status  02/20/2022 21 10 - 35 U/L Final         Passed - ALT in normal range and within 360 days    ALT  Date Value Ref Range Status  02/20/2022 20 6 - 29 U/L Final         Passed - Last BP in normal range    BP  Readings from Last 1 Encounters:  05/05/22 138/72         Passed - Last Heart Rate in normal range    Pulse Readings from Last 1 Encounters:  05/05/22 66

## 2022-11-03 ENCOUNTER — Emergency Department
Admission: EM | Admit: 2022-11-03 | Discharge: 2022-11-03 | Disposition: A | Discharge status: Home / Self Care | Payer: Medicare Other | Attending: Emergency Medicine | Admitting: Emergency Medicine

## 2022-11-03 ENCOUNTER — Ambulatory Visit (INDEPENDENT_AMBULATORY_CARE_PROVIDER_SITE_OTHER): Payer: Medicare Other | Admitting: Nurse Practitioner

## 2022-11-03 ENCOUNTER — Emergency Department: Payer: Medicare Other

## 2022-11-03 ENCOUNTER — Other Ambulatory Visit: Payer: Self-pay

## 2022-11-03 ENCOUNTER — Encounter: Payer: Self-pay | Admitting: Nurse Practitioner

## 2022-11-03 VITALS — BP 140/70 | HR 79 | Temp 97.8°F | Ht 62.0 in | Wt 116.4 lb

## 2022-11-03 DIAGNOSIS — R0789 Other chest pain: Secondary | ICD-10-CM | POA: Diagnosis not present

## 2022-11-03 DIAGNOSIS — I10 Essential (primary) hypertension: Secondary | ICD-10-CM | POA: Insufficient documentation

## 2022-11-03 DIAGNOSIS — R079 Chest pain, unspecified: Secondary | ICD-10-CM | POA: Diagnosis not present

## 2022-11-03 DIAGNOSIS — F331 Major depressive disorder, recurrent, moderate: Secondary | ICD-10-CM | POA: Insufficient documentation

## 2022-11-03 DIAGNOSIS — E782 Mixed hyperlipidemia: Secondary | ICD-10-CM

## 2022-11-03 DIAGNOSIS — R1310 Dysphagia, unspecified: Secondary | ICD-10-CM | POA: Diagnosis not present

## 2022-11-03 DIAGNOSIS — I700 Atherosclerosis of aorta: Secondary | ICD-10-CM | POA: Diagnosis not present

## 2022-11-03 DIAGNOSIS — K2100 Gastro-esophageal reflux disease with esophagitis, without bleeding: Secondary | ICD-10-CM | POA: Diagnosis not present

## 2022-11-03 DIAGNOSIS — F5101 Primary insomnia: Secondary | ICD-10-CM

## 2022-11-03 DIAGNOSIS — F411 Generalized anxiety disorder: Secondary | ICD-10-CM | POA: Insufficient documentation

## 2022-11-03 DIAGNOSIS — M549 Dorsalgia, unspecified: Secondary | ICD-10-CM | POA: Diagnosis not present

## 2022-11-03 LAB — CBC
HCT: 40.2 % (ref 36.0–46.0)
Hemoglobin: 13.3 g/dL (ref 12.0–15.0)
MCH: 31.4 pg (ref 26.0–34.0)
MCHC: 33.1 g/dL (ref 30.0–36.0)
MCV: 94.8 fL (ref 80.0–100.0)
Platelets: 252 10*3/uL (ref 150–400)
RBC: 4.24 MIL/uL (ref 3.87–5.11)
RDW: 12.2 % (ref 11.5–15.5)
WBC: 7.9 10*3/uL (ref 4.0–10.5)
nRBC: 0 % (ref 0.0–0.2)

## 2022-11-03 LAB — BASIC METABOLIC PANEL
Anion gap: 11 (ref 5–15)
BUN: 17 mg/dL (ref 8–23)
CO2: 26 mmol/L (ref 22–32)
Calcium: 9.8 mg/dL (ref 8.9–10.3)
Chloride: 102 mmol/L (ref 98–111)
Creatinine, Ser: 0.6 mg/dL (ref 0.44–1.00)
GFR, Estimated: >60 mL/min (ref >60)
Glucose, Bld: 105 mg/dL — ABNORMAL HIGH (ref 70–99)
Potassium: 4.1 mmol/L (ref 3.5–5.1)
Sodium: 139 mmol/L (ref 135–145)

## 2022-11-03 LAB — TROPONIN I (HIGH SENSITIVITY): Troponin I (High Sensitivity): 3 ng/L (ref <18)

## 2022-11-03 MED ORDER — ESOMEPRAZOLE MAGNESIUM 40 MG PO CPDR: 40.0000 mg | DELAYED_RELEASE_CAPSULE | Freq: Two times a day (BID) | ORAL | 3 refills | Status: DC

## 2022-11-03 MED ORDER — BUPROPION HCL ER (XL) 150 MG PO TB24: 150.0000 mg | ORAL_TABLET | Freq: Every day | ORAL | 0 refills | Status: DC

## 2022-11-03 NOTE — Progress Notes (Signed)
[name], NP-C Phone: [phone]  Amber Brooks is a 77 y.o. female who presents today to establish care.   Discussed the use of AI scribe software for clinical note transcription with the patient, who gave verbal consent to proceed.  History of Present Illness   The patient, with a history of hypertension, hyperlipidemia, acid reflux, insomnia, and depression, presents with new onset chest pain for the past week. The pain is described as central, radiating downwards, and occasionally towards the back. The patient denies any radiation to the arms or neck, and no associated shortness of breath. The patient is unable to identify any specific triggers or relieving factors for the chest pain. The patient also reports experiencing palpitations in the past week, which coincide with the onset of their chest pain. They have been monitoring their heart rate with a watch, which shows an increase in heart rate during episodes of chest pain.  The patient also reports an increase in work-related stress, which may be contributing to their symptoms. They have a history of cardiology consultations, but no significant cardiac history was noted. The patient is currently on carvedilol 25mg  twice daily and amlodipine 10mg  daily for hypertension, and reports variable blood pressure readings at home, with a tendency towards elevated systolic readings.  The patient is also on rosuvastatin 10mg  daily for hyperlipidemia, and reports occasional nocturnal leg cramps, which are relieved by consuming pickles. They are also on trazodone 50mg  at bedtime for insomnia, which they feel is somewhat helpful, but they still report difficulty achieving adequate sleep.  For acid reflux, the patient is on esomeprazole, which they feel is helpful. They report difficulty swallowing both food and pills, a problem they have had for a long time. They have a history of esophageal dilations due to this issue. They deny any new abdominal  pain or blood in the stool.  The patient is also on venlafaxine for depression, which they feel is not currently effective. They report feeling emotional and having thoughts of being better off dead, but deny any plans to harm themselves. They have tried to wean off venlafaxine in the past but experienced significant withdrawal symptoms. They express fear about trying to wean off the medication again due to these past experiences. They have a history of seeing a counselor, but did not find it helpful.       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) 06/15/2016   S/P cholecystectomy 12/18/2019   Aneurysm (HCC) 12/18/2019   Mixed hyperlipidemia 12/18/2019   Age-related osteoporosis without current pathological fracture 02/06/2020   Allergic rhinitis due to pollen 02/06/2020   Diastolic dysfunction 02/06/2020   History of adenomatous polyp of colon 02/06/2020   Primary insomnia 02/06/2020   Irritable bowel syndrome 02/06/2020   Menopausal and postmenopausal disorder 02/06/2020   Migraine with aura 02/06/2020   Sleep walking 02/06/2020   Alcohol use 06/18/2016   Lumbar and sacral osteoarthritis 03/22/2020   Atherosclerosis of renal artery (HCC) 03/20/2021   Generalized anxiety disorder 11/03/2022   Moderate episode of recurrent major depressive disorder (HCC) 11/03/2022   Dysphagia 11/03/2022   Resolved Ambulatory Problems  Diagnosis Date Noted   GERD (gastroesophageal reflux disease)    Anxiety    Suicide attempt (HCC) 06/15/2016   Depression 06/16/2016   Hypertensive heart failure (HCC) 02/06/2020   Menopause 02/06/2020    Severe major depression, single episode, without psychotic features (HCC) 02/06/2020   Abnormal cholesterol test 02/10/2020   Past Medical History:  Diagnosis Date   Allergy 1968   Brain aneurysm    Cataract 2022   Complication of anesthesia    Headache(784.0)    History of chicken pox    Hyperlipidemia    PONV (postoperative nausea and vomiting)    Shingles     Family History  Problem Relation Age of Onset   Vision loss Mother    Stroke Father    Heart disease Father    Dementia Father    Arthritis Father    Hypertension Father    Breast cancer Cousin    Dementia Brother    Depression Maternal Grandfather     Social History   Socioeconomic History   Marital status: Divorced    Spouse name: Not on file   Number of children: 1   Years of education: College   Highest education level: Associate degree: occupational, Scientist, product/process development, or vocational program  Occupational History   Occupation: [employer]  Tobacco Use   Smoking status: Never   Smokeless tobacco: Never  Vaping Use   Vaping status: Never Used  Substance and Sexual Activity   Alcohol use: Yes    Alcohol/week: 1.0 standard drink of alcohol    Types: 1 Glasses of wine per week    Comment: every evening   Drug use: No   Sexual activity: Not Currently  Other Topics Concern   Not on file  Social History Narrative   Right handed.  Caffeine decaf 1 cup in am.  Divorced.  1 adopted daughter.  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    Social Determinants of Health   Financial Resource Strain: Low Risk  (10/29/2022)   Overall Financial Resource Strain (CARDIA)    Difficulty of Paying Living Expenses: Not hard at all  Food Insecurity: No Food Insecurity (10/29/2022)   Hunger Vital Sign    Worried About Running Out of Food in the Last Year: Never true    Ran Out of Food in the Last Year: Never true  Transportation Needs: No Transportation Needs (10/29/2022)   PRAPARE - Administrator, Civil Service  (Medical): No    Lack of Transportation (Non-Medical): No  Physical Activity: Insufficiently Active (10/29/2022)   Exercise Vital Sign    Days of Exercise per Week: 4 days    Minutes of Exercise per Session: 30 min  Stress: No Stress Concern Present (10/29/2022)   [employer] of Occupational Health - Occupational Stress Questionnaire    Feeling of Stress : Only a little  Social Connections: Moderately Integrated (10/29/2022)   Social Connection and Isolation Panel [NHANES]    Frequency of Communication with Friends and Family: More than three times a week    Frequency of Social Gatherings with Friends and Family: Three times a week    Attends Religious Services: More than 4 times per year    Active Member of Clubs or Organizations: Yes    Attends Banker Meetings: 1 to 4 times per year    Marital Status: Divorced  Intimate Partner Violence: Not At Risk (02/24/2022)   Humiliation, Afraid, Rape, and Kick questionnaire    Fear of Current or Ex-Partner: No  Emotionally Abused: No    Physically Abused: No    Sexually Abused: No    ROS  General:  Negative for unexplained weight loss, fever Skin: Negative for new or changing mole, sore that won't heal HEENT: Negative for trouble hearing, trouble seeing, ringing in ears, mouth sores, hoarseness, change in voice, dysphagia. CV:  Negative for dyspnea, edema, palpitations Resp: Negative for cough, dyspnea, hemoptysis GI: Negative for nausea, vomiting, diarrhea, constipation, abdominal pain, melena, hematochezia. GU: Negative for dysuria, incontinence, urinary hesitance, hematuria, vaginal or penile discharge, polyuria, sexual difficulty, lumps in testicle or breasts MSK: Negative for muscle cramps or aches, joint pain or swelling Neuro: Negative for headaches, weakness, numbness, dizziness, passing out/fainting Psych: Negative for memory problems  Objective  Physical Exam Vitals:   11/03/22 1526  BP: (!) 140/70   Pulse: 79  Temp: 97.8 F (36.6 C)  SpO2: 99%    BP Readings from Last 3 Encounters:  11/03/22 (!) 140/70  05/05/22 138/72  03/31/22 122/70   Wt Readings from Last 3 Encounters:  11/03/22 116 lb 6.4 oz (52.8 kg)  05/05/22 118 lb 9.6 oz (53.8 kg)  03/31/22 111 lb (50.3 kg)    Physical Exam Constitutional:      General: She is not in acute distress.    Appearance: Normal appearance.  HENT:     Head: Normocephalic.  Cardiovascular:     Rate and Rhythm: Normal rate and regular rhythm.     Heart sounds: Normal heart sounds.  Pulmonary:     Effort: Pulmonary effort is normal.     Breath sounds: Normal breath sounds.  Skin:    General: Skin is warm and dry.  Neurological:     General: No focal deficit present.     Mental Status: She is alert.  Psychiatric:        Mood and Affect: Mood normal.        Behavior: Behavior normal.    Assessment/Plan:   Chest pain, unspecified type Assessment & Plan: They report intermittent central chest pain for one week, sometimes radiating to the back, without associated shortness of breath, dizziness, or leg swelling, and no significant cardiac history. The pain is not clearly related to stress or anxiety, but they report palpitations and heart rate fluctuations. EKG with nonspecific ST depression, not previously noted when compared to prior EKG in chart. Advised need for further work up. Patient agreeable to evaluation in the Emergency Department. She will go straight there. Declined transportation.   Orders: -     EKG 12-Lead  Moderate episode of recurrent major depressive disorder [facility]) Assessment & Plan: They continue to experience ongoing depression, characterized by crying spells and feelings of worthlessness, despite being on Effexor 150 mg, which they find ineffective. Previous attempts to wean off Effexor led to significant withdrawal symptoms, including dizziness, nausea, and jitteriness. They currently have no thoughts or plans  of self-harm and report strong family support and happiness at work. We discussed the risks of Effexor withdrawal and devised a slow tapering plan with the addition of a new antidepressant. The plan is to reduce Effexor to 75 mg plus 37.5 mg for four weeks, then reassess. We will add Wellbutrin XL 150 mg daily during the weaning process, schedule a follow-up in 4 weeks, monitor for withdrawal symptoms and any worsening of thoughts or behavior changes, and encourage them to contact us if symptoms worsen.  Orders: -     buPROPion HCl ER (XL); Take 1 tablet (150 mg total)  by mouth daily.  Dispense: 90 tablet; Refill: 0  Dysphagia, unspecified type Assessment & Plan: They are currently on Nexium and report difficulty swallowing pills and food, with a history of esophageal dilation. Symptoms include heartburn and an occasional bitter taste, and they are aware of some dietary triggers. She will continue Nexium, referral placed to GI for dysphagia, advised avoiding known dietary triggers.  Orders: -     Ambulatory referral to Gastroenterology  Primary hypertension Assessment & Plan: Their blood pressure readings at home are variable, often around 140/70, while on Coreg (carvedilol) 25 mg twice daily and Norvasc (amlodipine) 10 mg once daily, without symptoms of shortness of breath, dizziness, or leg swelling. She will continue her current medications and continue checking her blood pressure at home. Will reassess at next visit and add medication if blood pressure continues to be elevated.    Mixed hyperlipidemia Assessment & Plan: They are currently on Crestor 10 mg daily, with no new abdominal pain or significant muscle aches, though they report occasional leg cramps at night. She will continue Crestor 10 mg daily.   Gastroesophageal reflux disease with esophagitis without hemorrhage Assessment & Plan: See dysphagia plan. Continue Nexium. Refills sent.   Orders: -     Ambulatory referral to  Gastroenterology -     Esomeprazole Magnesium; Take 1 capsule (40 mg total) by mouth 2 (two) times daily before a meal.  Dispense: 180 capsule; Refill: 3  Primary insomnia Assessment & Plan: They are currently on trazodone 50 mg at bedtime, which helps but still results in only about 6 hours of sleep. They prefer not to increase the dose due to side effects experienced with other medications in the past. She will continue trazodone 50 mg at bedtime.      Return in about 4 weeks (around 12/01/2022) for Anxiety/Depression.   [name], NP-C [facility] - [employer]

## 2022-11-03 NOTE — Assessment & Plan Note (Signed)
They are currently on Crestor 10 mg daily, with no new abdominal pain or significant muscle aches, though they report occasional leg cramps at night. She will continue Crestor 10 mg daily.

## 2022-11-03 NOTE — Assessment & Plan Note (Signed)
They report intermittent central chest pain for one week, sometimes radiating to the back, without associated shortness of breath, dizziness, or leg swelling, and no significant cardiac history. The pain is not clearly related to stress or anxiety, but they report palpitations and heart rate fluctuations. EKG with nonspecific ST depression, not previously noted when compared to prior EKG in chart. Advised need for further work up. Patient agreeable to evaluation in the Emergency Department. She will go straight there. Declined transportation.

## 2022-11-03 NOTE — Assessment & Plan Note (Signed)
Their blood pressure readings at home are variable, often around 140/70, while on Coreg (carvedilol) 25 mg twice daily and Norvasc (amlodipine) 10 mg once daily, without symptoms of shortness of breath, dizziness, or leg swelling. She will continue her current medications and continue checking her blood pressure at home. Will reassess at next visit and add medication if blood pressure continues to be elevated.

## 2022-11-03 NOTE — ED Triage Notes (Signed)
Pt ambulatory to triage for CP that started a couple days ago and has worsened. Radiates to back.

## 2022-11-03 NOTE — Assessment & Plan Note (Signed)
They are currently on trazodone 50 mg at bedtime, which helps but still results in only about 6 hours of sleep. They prefer not to increase the dose due to side effects experienced with other medications in the past. She will continue trazodone 50 mg at bedtime.

## 2022-11-03 NOTE — Assessment & Plan Note (Signed)
They are currently on Nexium and report difficulty swallowing pills and food, with a history of esophageal dilation. Symptoms include heartburn and an occasional bitter taste, and they are aware of some dietary triggers. She will continue Nexium, referral placed to GI for dysphagia, advised avoiding known dietary triggers.

## 2022-11-03 NOTE — Assessment & Plan Note (Addendum)
See dysphagia plan. Continue Nexium. Refills sent.

## 2022-11-03 NOTE — Assessment & Plan Note (Signed)
They continue to experience ongoing depression, characterized by crying spells and feelings of worthlessness, despite being on Effexor 150 mg, which they find ineffective. Previous attempts to wean off Effexor led to significant withdrawal symptoms, including dizziness, nausea, and jitteriness. They currently have no thoughts or plans of self-harm and report strong family support and happiness at work. We discussed the risks of Effexor withdrawal and devised a slow tapering plan with the addition of a new antidepressant. The plan is to reduce Effexor to 75 mg plus 37.5 mg for four weeks, then reassess. We will add Wellbutrin XL 150 mg daily during the weaning process, schedule a follow-up in 4 weeks, monitor for withdrawal symptoms and any worsening of thoughts or behavior changes, and encourage them to contact us if symptoms worsen.

## 2022-11-03 NOTE — ED Provider Notes (Signed)
[facility] Provider Note   Event Date/Time   First MD Initiated Contact with Patient 11/03/22 1848     (approximate) History  Chest Pain  HPI Amber Brooks is a 77 y.o. female with a stated past medical history of hypertension, anxiety/depression, mitral valve prolapse, and hyperlipidemia who presents complaining of chest pain.  Patient states that this chest pain is in the lower sternal portion of the chest and occasionally radiates through to her back.  Patient denies any exertional worsening of this pain.  Patient denies any associated shortness of breath.  Patient denies any nausea/vomiting associated with this pain.  Patient states that has been intermittent but denies any known exacerbating or relieving factors. ROS: Patient currently denies any vision changes, tinnitus, difficulty speaking, facial droop, sore throat, abdominal pain, nausea/vomiting/diarrhea, dysuria, or weakness/numbness/paresthesias in any extremity   Physical Exam  Triage Vital Signs: ED Triage Vitals  Encounter Vitals Group     BP 11/03/22 1641 (!) 161/72     Systolic BP Percentile --      Diastolic BP Percentile --      Pulse Rate 11/03/22 1641 81     Resp 11/03/22 1641 18     Temp 11/03/22 1641 97.7 F (36.5 C)     Temp Source 11/03/22 1641 Oral     SpO2 11/03/22 1641 100 %     Weight --      Height --      Head Circumference --      Peak Flow --      Pain Score 11/03/22 1640 8     Pain Loc --      Pain Education --      Exclude from Growth Chart --    Most recent vital signs: Vitals:   11/03/22 1641  BP: (!) 161/72  Pulse: 81  Resp: 18  Temp: 97.7 F (36.5 C)  SpO2: 100%   General: Awake, oriented x4. CV:  Good peripheral perfusion.  Resp:  Normal effort.  Abd:  No distention.  Other:  Elderly well-developed, well-nourished Caucasian female resting comfortably in no acute distress ED Results / Procedures / Treatments  Labs (all labs ordered are listed, but  only abnormal results are displayed) Labs Reviewed  BASIC METABOLIC PANEL - Abnormal; Notable for the following components:      Result Value   Glucose, Bld 105 (*)    All other components within normal limits  CBC  TROPONIN I (HIGH SENSITIVITY)  TROPONIN I (HIGH SENSITIVITY)   EKG ED ECG REPORT I, Merwyn Katos, the attending physician, personally viewed and interpreted this ECG. Date: 11/03/2022 EKG Time: 1642 Rate: 79 Rhythm: normal sinus rhythm QRS Axis: normal Intervals: normal ST/T Wave abnormalities: normal Narrative Interpretation: no evidence of acute ischemia RADIOLOGY ED MD interpretation: 2 view chest x-ray interpreted by me shows no evidence of acute abnormalities including no pneumonia, pneumothorax, or widened mediastinum -Agree with radiology assessment Official radiology report(s): No results found. PROCEDURES: Critical Care performed: No .1-3 Lead EKG Interpretation  Performed by: Merwyn Katos, MD Authorized by: Merwyn Katos, MD     Interpretation: normal     ECG rate:  81   ECG rate assessment: normal     Rhythm: sinus rhythm     Ectopy: none     Conduction: normal    MEDICATIONS ORDERED IN ED: Medications - No data to display IMPRESSION / MDM / ASSESSMENT AND PLAN / ED COURSE  I reviewed the  triage vital signs and the nursing notes.                             The patient is on the cardiac monitor to evaluate for evidence of arrhythmia and/or significant heart rate changes. Patient's presentation is most consistent with acute presentation with potential threat to life or bodily function. Patient is a 77 year old female who presents for 2 days of lower sternal chest pain Workup: ECG, CXR, CBC, BMP, Troponin Findings: ECG: No overt evidence of STEMI. No evidence of Brugada's sign, delta wave, epsilon wave, significantly prolonged QTc, or malignant arrhythmia HS Troponin: Negative x1 Other Labs unremarkable for emergent problems. CXR:  Without PTX, PNA, or widened mediastinum Last Stress Test:  04/07/2014 (neg) Last Heart Catheterization:  NA HEART Score: 3  Given History, Exam, and Workup I have low suspicion for ACS, Pneumothorax, Pneumonia, Pulmonary Embolus, Tamponade, Aortic Dissection or other emergent problem as a cause for this presentation.   Reassesment: Prior to discharge patient's pain was controlled and they were well appearing.  Disposition:  Discharge. Strict return precautions discussed with patient with full understanding. Advised patient to follow up promptly with primary care provider   FINAL CLINICAL IMPRESSION(S) / ED DIAGNOSES   Final diagnoses:  Chest pain, unspecified type   Rx / DC Orders   ED Discharge Orders          Ordered    Ambulatory referral to Cardiology       Comments: If you have not heard from the Cardiology office within the next 72 hours please call [phone].   11/03/22 1850           Note:  This document was prepared using Dragon voice recognition software and may include unintentional dictation errors.   Merwyn Katos, MD 11/03/22 [phone]

## 2022-11-06 ENCOUNTER — Other Ambulatory Visit: Payer: Self-pay | Admitting: Nurse Practitioner

## 2022-11-06 ENCOUNTER — Encounter: Payer: Self-pay | Admitting: Nurse Practitioner

## 2022-11-06 DIAGNOSIS — F331 Major depressive disorder, recurrent, moderate: Secondary | ICD-10-CM

## 2022-11-06 DIAGNOSIS — H1045 Other chronic allergic conjunctivitis: Secondary | ICD-10-CM | POA: Diagnosis not present

## 2022-11-06 DIAGNOSIS — K2100 Gastro-esophageal reflux disease with esophagitis, without bleeding: Secondary | ICD-10-CM

## 2022-11-06 DIAGNOSIS — H04123 Dry eye syndrome of bilateral lacrimal glands: Secondary | ICD-10-CM | POA: Diagnosis not present

## 2022-11-06 DIAGNOSIS — H1131 Conjunctival hemorrhage, right eye: Secondary | ICD-10-CM | POA: Diagnosis not present

## 2022-11-06 MED ORDER — VENLAFAXINE HCL ER 37.5 MG PO CP24
37.5000 mg | ORAL_CAPSULE | Freq: Every day | ORAL | 0 refills | Status: DC
Start: 2022-11-06 — End: 2022-12-08

## 2022-11-07 NOTE — Progress Notes (Unsigned)
Cardiology Office Note    Date:  11/08/2022   ID:  Amber Brooks, DOB [birth year], MRN [id number]  PCP:  [name], NP  Cardiologist:  Debbe Odea, MD  Electrophysiologist:  None   Chief Complaint: ED follow up  History of Present Illness:   Amber Brooks is a 77 y.o. female with history of CAD by coronary CTA in 2022, brain aneurysm status post clipping, HTN, HLD, right renal artery stenosis, and carotid artery stenosis who presents for ED follow-up as outlined below.  Lexiscan MPI from 04/2014 showed no evidence of ischemia with an EF of 76%.  Echo at that time showed an EF of 60 to 65%, no regional wall motion abnormalities, and grade 1 diastolic dysfunction.  She was seen in the office in 01/2020 as a new patient for evaluation of chest pressure and palpitations that began after receiving her second COVID-vaccine.  Echo in 01/2020 showed an EF of 55 to 60%, no regional wall motion abnormalities, mild LVH, grade 1 diastolic dysfunction, normal RV systolic function and ventricular cavity size, and no significant valvular abnormalities.  Coronary CTA in 01/2020 showed a calcium score of 168 which was the 73rd percentile.  There was 25 to 49% mid LAD stenosis along with mild aortic atherosclerosis noted.  She was last seen in the office in 02/2020 for follow-up of the above testing with recommendation to escalate to carvedilol in the setting of continued elevated BP.  With regards to her vascular disease, she is followed by vascular surgery.  In the setting of difficult to control hypertension, she underwent renal artery ultrasound in 02/2020 showed 1 to 59% stenosis of the proximal right renal artery and no evidence of left renal artery stenosis.  Carotid artery ultrasound in 02/2020 showed 1 to 39% bilateral ICA stenosis with antegrade flow of the bilateral vertebral arteries and normal flow hemodynamics of the bilateral subclavian arteries.  Renal angiography in 03/2020 showed a 30%  right renal artery stenosis with medical therapy recommended.  Renal artery ultrasound from 03/2021 showed 1 to 59% right renal artery stenosis with no evidence of left renal artery stenosis as well as mild aortic atherosclerosis.  Most recent renal artery ultrasound from 05/2022 showed no evidence of renal artery stenosis bilaterally.  She was seen in the ED on 11/03/2022 with chest discomfort in the lower portion of her chest that occurred at rest and was associated with shortness of breath.  She indicated the pain occurred in the middle of the night, though she is not sure if this pain woke her up.  She ended up going to work that morning, though the pain persisted.  She was evaluated at her PCPs office with recommendation to proceed to the ED.  Upon arrival to the ED, blood pressure was elevated at 161/72.  High-sensitivity troponin negative.  Chest x-ray without acute cardiopulmonary process.  EKG showed sinus rhythm with nonspecific ST-T changes.  Note indicates a low suspicion for ACS, PE, tamponade, pneumothorax, or aortic dissection.  Outpatient cardiology evaluation was recommended.  Since her ED evaluation she has done well.  She did have a brief episode of chest discomfort on 11/5 that occurred at rest and was short-lived with spontaneous resolution.  At times, she describes a "flip" in her chest as well.  No dizziness, presyncope, or syncope.  She reports blood pressure at home and at work will range from the 150s to 170s systolic.  She is dealing with some injection of the right sclera  that her ophthalmologist is aware of.  Currently chest pain-free.    Labs independently reviewed: 11/2022 - Hgb 13.3, PLT 252, potassium 4.1, BUN 17, serum creatinine 0.6 02/2022 - TSH normal TC 166, TG 77, HDL 85, LDL 65, BUN 26, serum creatinine 0.78, potassium 4.9, BUN 4.6, AST/ALT normal, A1c 5.4  Past Medical History:  Diagnosis Date   Allergy 1968   Latex   Anxiety    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   Brain  aneurysm    Cataract 2022   Complication of anesthesia    Depression    GERD (gastroesophageal reflux disease)    Headache(784.0)    migraines   History of chicken pox    Hyperlipidemia    Hypertension    Mitral valve prolapse    PONV (postoperative nausea and vomiting)    Shingles     Past Surgical History:  Procedure Laterality Date   ABDOMINAL HYSTERECTOMY  1987   ANEURYSM COILING     APPENDECTOMY  1967   BRAIN SURGERY  2014   CHOLECYSTECTOMY  2018   COLONOSCOPY N/A 09/27/2020   Procedure: COLONOSCOPY;  Surgeon: Jaynie Collins, DO;  Location: [facility] ENDOSCOPY;  Service: Gastroenterology;  Laterality: N/A;   CRANIOTOMY Left 11/15/2012   Procedure: CRANIOTOMY INTRACRANIAL ANEURYSM FOR CAROTID;  Surgeon: Carmela Hurt, MD;  Location: [facility] NEURO ORS;  Service: Neurosurgery;  Laterality: Left;  LEFT Craniotomy for Aneurysm   ECTOPIC PREGNANCY SURGERY     ESOPHAGOGASTRODUODENOSCOPY N/A 09/27/2020   Procedure: ESOPHAGOGASTRODUODENOSCOPY (EGD);  Surgeon: Jaynie Collins, DO;  Location: [facility] ENDOSCOPY;  Service: Gastroenterology;  Laterality: N/A;   KNEE ARTHROSCOPY     LUMBAR LAMINECTOMY/DECOMPRESSION MICRODISCECTOMY N/A 04/27/2014   Procedure: LUMBAR LAMINECTOMY/DECOMPRESSION MICRODISCECTOMY LUMBAR FOUR-FIVE ;  Surgeon: Barnett Abu, MD;  Location: [facility] NEURO ORS;  Service: Neurosurgery;  Laterality: N/A;   RADIOLOGY WITH ANESTHESIA N/A 10/09/2012   Procedure: RADIOLOGY WITH ANESTHESIA;  Surgeon: Oneal Grout, MD;  Location: [facility] OR;  Service: Radiology;  Laterality: N/A;   RENAL ANGIOGRAPHY N/A 03/09/2020   Procedure: RENAL ANGIOGRAPHY;  Surgeon: Renford Dills, MD;  Location: [facility] INVASIVE CV LAB;  Service: Cardiovascular;  Laterality: N/A;   TONSILLECTOMY     VASCULAR SURGERY     stent placed    Current Medications: Current Meds  Medication Sig   amLODipine (NORVASC) 10 MG tablet TAKE 1 TABLET BY MOUTH DAILY   buPROPion (WELLBUTRIN XL) 150 MG 24 hr tablet  Take 1 tablet (150 mg total) by mouth daily.   carvedilol (COREG) 25 MG tablet TAKE 1 TABLET BY MOUTH TWICE  DAILY   EPINEPHrine 0.3 mg/0.3 mL IJ SOAJ injection Inject into the muscle once as needed.   fluticasone (FLONASE) 50 MCG/ACT nasal spray Place 2 sprays into both nostrils daily.   ibuprofen (ADVIL) 800 MG tablet TAKE 1 TABLET BY MOUTH EVERY 8  HOURS AS NEEDED FOR HEADACHE   levocetirizine (XYZAL) 5 MG tablet Take 1 tablet (5 mg total) by mouth every evening. (Patient taking differently: Take 5 mg by mouth every evening. prn)   montelukast (SINGULAIR) 10 MG tablet TAKE 1 TABLET BY MOUTH AT  BEDTIME (Patient taking differently: Take 10 mg by mouth at bedtime. prn)   Multiple Vitamin (MULTIVITAMIN WITH MINERALS) TABS tablet Take 1 tablet by mouth daily.   Omega 3 1000 MG CAPS Take 1,000 mg by mouth daily.   pantoprazole (PROTONIX) 40 MG tablet Take 40 mg by mouth 2 (two) times daily.   rosuvastatin (CRESTOR) 10 MG tablet TAKE  1 TABLET BY MOUTH AT  BEDTIME   traZODone (DESYREL) 50 MG tablet Take 1 tablet (50 mg total) by mouth at bedtime.   venlafaxine XR (EFFEXOR XR) 37.5 MG 24 hr capsule Take 1 capsule (37.5 mg total) by mouth daily with breakfast.   [DISCONTINUED] losartan (COZAAR) 25 MG tablet Take 1 tablet (25 mg total) by mouth daily.   [DISCONTINUED] metoprolol tartrate (LOPRESSOR) 50 MG tablet Take 1 tablet (50 mg total) by mouth once for 1 dose.    Allergies:   Hydromorphone, Latex, Atorvastatin, Clonidine, Clonidine derivatives, Lisinopril, and Silenor [doxepin]   Social History   Socioeconomic History   Marital status: Divorced    Spouse name: Not on file   Number of children: 1   Years of education: College   Highest education level: Associate degree: occupational, Scientist, product/process development, or vocational program  Occupational History   Occupation: [employer]  Tobacco Use   Smoking status: Never   Smokeless tobacco: Never  Vaping Use   Vaping status: Never Used  Substance  and Sexual Activity   Alcohol use: Yes    Alcohol/week: 1.0 standard drink of alcohol    Types: 1 Glasses of wine per week    Comment: every evening   Drug use: No   Sexual activity: Not Currently  Other Topics Concern   Not on file  Social History Narrative   Right handed.  Caffeine decaf 1 cup in am.  Divorced.  1 adopted daughter.  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    Social Determinants of Health   Financial Resource Strain: Low Risk  (10/29/2022)   Overall Financial Resource Strain (CARDIA)    Difficulty of Paying Living Expenses: Not hard at all  Food Insecurity: No Food Insecurity (10/29/2022)   Hunger Vital Sign    Worried About Running Out of Food in the Last Year: Never true    Ran Out of Food in the Last Year: Never true  Transportation Needs: No Transportation Needs (10/29/2022)   PRAPARE - Administrator, Civil Service (Medical): No    Lack of Transportation (Non-Medical): No  Physical Activity: Insufficiently Active (10/29/2022)   Exercise Vital Sign    Days of Exercise per Week: 4 days    Minutes of Exercise per Session: 30 min  Stress: No Stress Concern Present (10/29/2022)   [employer] of Occupational Health - Occupational Stress Questionnaire    Feeling of Stress : Only a little  Social Connections: Moderately Integrated (10/29/2022)   Social Connection and Isolation Panel [NHANES]    Frequency of Communication with Friends and Family: More than three times a week    Frequency of Social Gatherings with Friends and Family: Three times a week    Attends Religious Services: More than 4 times per year    Active Member of Clubs or Organizations: Yes    Attends Banker Meetings: 1 to 4 times per year    Marital Status: Divorced     Family History:  The patient's family history includes Arthritis in her father; Breast cancer in her cousin; Dementia in her brother and father; Depression in her maternal grandfather; Heart disease in her father;  Hypertension in her father; Stroke in her father; Vision loss in her mother.  ROS:      EKGs/Labs/Other Studies Reviewed:    Studies reviewed were summarized above. The additional studies were reviewed today:  Coronary CTA 01/22/2020: Aorta: Normal size. Mild descending aortic wall calcifications. No dissection.   Aortic Valve:  Trileaflet.  No calcifications.   Coronary Arteries:  Normal coronary origin.  Left dominance.   RCA is a small non-dominant artery.  There is no plaque.   Left main is a large artery that gives rise to LAD and LCX arteries.   LAD has calcified plaque in the mid vessel causing mild stenosis (25-49%)   LCX is a dominant artery that gives rise to one large OM1 branch. It gives rise to PDA and PLA. There is no stenosis   Other findings:   Normal pulmonary vein drainage into the left atrium.   Normal left atrial appendage without a thrombus.   Normal size of the pulmonary artery.   IMPRESSION: 1. Coronary calcium score of 168. This was 73rd percentile for age and sex matched control. 2. Normal coronary origin with left dominance.  3.  Calcified plaque causing mild stenosis in the mid LAD (25-49%) 4. CAD-RADS 2. Mild non-obstructive CAD (25-49%). Consider non-atherosclerotic causes of chest pain. Consider preventive therapy and risk factor modification. __________  2D echo 01/15/2020: 1. Left ventricular ejection fraction, by estimation, is 55 to 60%. Left  ventricular ejection fraction by 3D volume is 56 %. The left ventricle has  normal function. The left ventricle has no regional wall motion  abnormalities. There is mild left  ventricular hypertrophy of the basal and septal segments. Left ventricular  diastolic parameters are consistent with Grade I diastolic dysfunction  (impaired relaxation).   2. Right ventricular systolic function is normal. The right ventricular  size is normal.   3. The mitral valve is normal in structure. No  evidence of mitral valve  regurgitation. No evidence of mitral stenosis.   4. The aortic valve is normal in structure. Aortic valve regurgitation is  not visualized. No aortic stenosis is present.   5. The inferior vena cava is normal in size with greater than 50%  respiratory variability, suggesting right atrial pressure of 3 mmHg.  __________  2D echo 04/07/2014: - Left ventricle: The cavity size was normal. Systolic function was    normal. The estimated ejection fraction was in the range of 60%    to 65%. Wall motion was normal; there were no regional wall    motion abnormalities. There was an increased relative    contribution of atrial contraction to ventricular filling.    Doppler parameters are consistent with abnormal left ventricular    relaxation (grade 1 diastolic dysfunction).  __________  Eugenie Birks MPI/05/2014: IMPRESSION: Normal lexiscan myovue no infarct or ischemia EF 76%   EKG:  EKG on 11/1 at PCP's office and ED are reviewed.  Recent Labs: 02/20/2022: ALT 20; TSH 3.29 11/03/2022: BUN 17; Creatinine, Ser 0.60; Hemoglobin 13.3; Platelets 252; Potassium 4.1; Sodium 139  Recent Lipid Panel    Component Value Date/Time   CHOL 166 02/20/2022 0825   TRIG 77 02/20/2022 0825   HDL 85 02/20/2022 0825   CHOLHDL 2.0 02/20/2022 0825   VLDL 33 04/07/2014 0425   LDLCALC 65 02/20/2022 0825    PHYSICAL EXAM:    VS:  BP 134/68 (BP Location: Left Arm, Patient Position: Sitting, Cuff Size: Normal)   Pulse 72   Ht 5\' 2"  (1.575 m)   Wt 113 lb (51.3 kg)   SpO2 98%   BMI 20.67 kg/m   BMI: Body mass index is 20.67 kg/m.  Physical Exam Vitals reviewed.  Constitutional:      Appearance: She is well-developed.  HENT:     Head: Normocephalic and atraumatic.  Eyes:  General:        Right eye: No discharge.        Left eye: No discharge.     Conjunctiva/sclera:     Right eye: Hemorrhage present.  Neck:     Vascular: No JVD.  Cardiovascular:     Rate and Rhythm: Normal  rate and regular rhythm.     Pulses:          Posterior tibial pulses are 2+ on the right side and 2+ on the left side.     Heart sounds: Normal heart sounds, S1 normal and S2 normal. Heart sounds not distant. No midsystolic click and no opening snap. No murmur heard.    No friction rub.  Pulmonary:     Effort: Pulmonary effort is normal. No respiratory distress.     Breath sounds: Normal breath sounds. No decreased breath sounds, wheezing, rhonchi or rales.  Chest:     Chest wall: No tenderness.  Abdominal:     General: There is no distension.  Musculoskeletal:     Cervical back: Normal range of motion.     Right lower leg: No edema.     Left lower leg: No edema.  Skin:    General: Skin is warm and dry.     Nails: There is no clubbing.  Neurological:     Mental Status: She is alert and oriented to person, place, and time.  Psychiatric:        Speech: Speech normal.        Behavior: Behavior normal.        Thought Content: Thought content normal.        Judgment: Judgment normal.     Wt Readings from Last 3 Encounters:  11/08/22 113 lb (51.3 kg)  11/03/22 116 lb 6.4 oz (52.8 kg)  05/05/22 118 lb 9.6 oz (53.8 kg)     ASSESSMENT & PLAN:   CAD involving the native coronary arteries with precordial pain: Currently without symptoms of angina or cardiac decompensation.  Recent evaluation in the ED with negative high-sensitivity troponin.  Schedule coronary CTA.  Following resolution of right scleral injection/hemorrhage, anticipate initiating aspirin 81 mg with continuation of rosuvastatin 10 mg for primary prevention.  HTN: Blood pressure remains suboptimally controlled for the most part.  Most recent renal artery ultrasound showed no evidence of RAS bilaterally.  Add losartan 25 mg daily with a follow-up BMP 1 week thereafter.  Continue amlodipine 10 mg and carvedilol 25 mg twice daily.  HLD: LDL 65 in 02/2022.  Remains on rosuvastatin 10 mg.  Carotid artery stenosis: 1 to 39%  bilateral ICA stenosis in 2022.  Anticipate initiating aspirin as outlined above with continuation of rosuvastatin.  Followed by vascular surgery.     Disposition: F/u with Dr. Azucena Cecil or an APP after coronary CTA.   Medication Adjustments/Labs and Tests Ordered: Current medicines are reviewed at length with the patient today.  Concerns regarding medicines are outlined above. Medication changes, Labs and Tests ordered today are summarized above and listed in the Patient Instructions accessible in Encounters.   Signed, Eula Listen, PA-C 11/08/2022 12:33 PM     [facility] [place] [address] [place], [place] [address] [phone]

## 2022-11-08 ENCOUNTER — Encounter: Payer: Self-pay | Admitting: Physician Assistant

## 2022-11-08 ENCOUNTER — Telehealth: Payer: Self-pay | Admitting: Physician Assistant

## 2022-11-08 ENCOUNTER — Ambulatory Visit: Payer: Medicare Other | Attending: Physician Assistant | Admitting: Physician Assistant

## 2022-11-08 VITALS — BP 134/68 | HR 72 | Ht 62.0 in | Wt 113.0 lb

## 2022-11-08 DIAGNOSIS — I10 Essential (primary) hypertension: Secondary | ICD-10-CM | POA: Diagnosis not present

## 2022-11-08 DIAGNOSIS — Z79899 Other long term (current) drug therapy: Secondary | ICD-10-CM | POA: Diagnosis not present

## 2022-11-08 DIAGNOSIS — I25118 Atherosclerotic heart disease of native coronary artery with other forms of angina pectoris: Secondary | ICD-10-CM

## 2022-11-08 DIAGNOSIS — I6523 Occlusion and stenosis of bilateral carotid arteries: Secondary | ICD-10-CM | POA: Diagnosis not present

## 2022-11-08 DIAGNOSIS — E785 Hyperlipidemia, unspecified: Secondary | ICD-10-CM

## 2022-11-08 DIAGNOSIS — R072 Precordial pain: Secondary | ICD-10-CM

## 2022-11-08 MED ORDER — METOPROLOL TARTRATE 50 MG PO TABS: 50.0000 mg | ORAL_TABLET | Freq: Once | ORAL | 0 refills | Status: DC

## 2022-11-08 MED ORDER — LOSARTAN POTASSIUM 25 MG PO TABS: 25.0000 mg | ORAL_TABLET | Freq: Every day | ORAL | 3 refills | Status: DC

## 2022-11-08 NOTE — Telephone Encounter (Signed)
Patient called back requesting me to call pharmacy because she stated they do not answer. She wanted to know if her prescription was received and able to be filled. She stated the prescription was sent to [facility] and [facility]. Called [facility] and they stated the prescription was filled and had been ready for her. Informed patient.

## 2022-11-08 NOTE — Patient Instructions (Signed)
Medication Instructions:  Your physician recommends the following medication changes.  START TAKING: Losartan 25 mg daily  *If you need a refill on your cardiac medications before your next appointment, please call your pharmacy*  Lab Work: Your provider would like for you to return in 1 week  to have the following labs drawn: BMP.   Please go to [facility] [address] ([facility] [address], [place] [address] You do not need an appointment.  They are open from 7:30 am-4 pm.  Lunch from 1:00 pm- 2:00 pm You DO NOT need to be fasting.  If you have labs (blood work) drawn today and your tests are completely normal, you will receive your results only by: MyChart Message (if you have MyChart) OR A paper copy in the mail If you have any lab test that is abnormal or we need to change your treatment, we will call you to review the results.   Testing/Procedures:   Your cardiac CT will be scheduled at one of the below locations:   [facility] [address] [place], [place] [address] [phone]  OR   [facility] [address] [place], [place] [address] [phone]  If scheduled at [facility] or [facility], please arrive 15 mins early for check-in and test prep.  There is spacious parking and easy access to the radiology department from the [facility] entrance. Please enter here and check-in with the desk attendant.   Please follow these instructions carefully (unless otherwise directed):  An IV will be required for this test and Nitroglycerin will be given.   On the Night Before the Test: Be sure to Drink plenty of water. Do not consume any caffeinated/decaffeinated beverages or chocolate 12 hours prior to your test. Do not take any antihistamines 12 hours prior to your test.   On the Day of the Test: Drink  plenty of water until 1 hour prior to the test. Do not eat any food 1 hour prior to test. You may take your regular medications prior to the test.  Take metoprolol (Lopressor) two hours prior to test. If you take Furosemide/Hydrochlorothiazide/Spironolactone, please HOLD on the morning of the test. FEMALES- please wear underwire-free bra if available, avoid dresses & tight clothing  *For Clinical Staff only. Please instruct patient the following:* Heart Rate Medication Recommendations for Cardiac CT  Resting HR > 65 bpm and BP >110/50 mmHG  Metoprolol tartrate 50 mg PO 90-120 minutes prior to scan        After the Test: Drink plenty of water. After receiving IV contrast, you may experience a mild flushed feeling. This is normal. On occasion, you may experience a mild rash up to 24 hours after the test. This is not dangerous. If this occurs, you can take Benadryl 25 mg and increase your fluid intake. If you experience trouble breathing, this can be serious. If it is severe call 911 IMMEDIATELY. If it is mild, please call our office. If you take any of these medications: Glipizide/Metformin, Avandament, Glucavance, please do not take 48 hours after completing test unless otherwise instructed.  We will call to schedule your test 2-4 weeks out understanding that some insurance companies will need an authorization prior to the service being performed.   For more information and frequently asked questions, please visit our website : [email or web address]  For non-scheduling related questions, please contact the cardiac imaging nurse navigator should you  have any questions/concerns: Cardiac Imaging Nurse Navigators Direct Office Dial: [phone]   For scheduling needs, including cancellations and rescheduling, please call [place], [phone].   Follow-Up: At [facility], you and your health needs are our priority.  As part of our continuing mission to provide you  with exceptional heart care, we have created designated Provider Care Teams.  These Care Teams include your primary Cardiologist (physician) and Advanced Practice Providers (APPs -  Physician Assistants and Nurse Practitioners) who all work together to provide you with the care you need, when you need it.  We recommend signing up for the patient portal called "MyChart".  Sign up information is provided on this After Visit Summary.  MyChart is used to connect with patients for Virtual Visits (Telemedicine).  Patients are able to view lab/test results, encounter notes, upcoming appointments, etc.  Non-urgent messages can be sent to your provider as well.   To learn more about what you can do with MyChart, go to [email or web address].    Your next appointment:   Please call us after having your CTA scheduled for [place] to schedule your follow-up appointment with Alycia Rossetti.  Provider:   You may see Debbe Odea, MD or one of the following Advanced Practice Providers on your designated Care Team:   Eula Listen, [place]

## 2022-11-08 NOTE — Telephone Encounter (Signed)
Patient said that pharmacy has not received the medication Losartan 25 mg daily that Eula Listen sent in this morning

## 2022-11-08 NOTE — Telephone Encounter (Signed)
Left a message for the patient to call back.  

## 2022-11-13 ENCOUNTER — Ambulatory Visit: Payer: Medicare Other | Admitting: Gastroenterology

## 2022-11-14 ENCOUNTER — Encounter (HOSPITAL_COMMUNITY): Payer: Self-pay

## 2022-11-15 ENCOUNTER — Other Ambulatory Visit
Admission: RE | Admit: 2022-11-15 | Discharge: 2022-11-15 | Disposition: A | Discharge status: Home / Self Care | Payer: Medicare Other | Attending: Physician Assistant | Admitting: Physician Assistant

## 2022-11-15 DIAGNOSIS — Z79899 Other long term (current) drug therapy: Secondary | ICD-10-CM | POA: Insufficient documentation

## 2022-11-15 DIAGNOSIS — R072 Precordial pain: Secondary | ICD-10-CM | POA: Diagnosis not present

## 2022-11-15 LAB — BASIC METABOLIC PANEL
Anion gap: 10 (ref 5–15)
BUN: 22 mg/dL (ref 8–23)
CO2: 26 mmol/L (ref 22–32)
Calcium: 9.1 mg/dL (ref 8.9–10.3)
Chloride: 100 mmol/L (ref 98–111)
Creatinine, Ser: 0.81 mg/dL (ref 0.44–1.00)
GFR, Estimated: >60 mL/min (ref >60)
Glucose, Bld: 101 mg/dL — ABNORMAL HIGH (ref 70–99)
Potassium: 4.4 mmol/L (ref 3.5–5.1)
Sodium: 136 mmol/L (ref 135–145)

## 2022-11-15 NOTE — Addendum Note (Signed)
Addended by: Lonell Face C on: 11/15/2022 09:55 AM   Modules accepted: Orders

## 2022-11-16 ENCOUNTER — Telehealth: Payer: Self-pay

## 2022-11-16 ENCOUNTER — Ambulatory Visit
Admission: RE | Admit: 2022-11-16 | Discharge: 2022-11-16 | Disposition: A | Discharge status: Home / Self Care | Payer: Medicare Other | Source: Ambulatory Visit | Attending: Physician Assistant | Admitting: Physician Assistant

## 2022-11-16 DIAGNOSIS — R072 Precordial pain: Secondary | ICD-10-CM | POA: Diagnosis not present

## 2022-11-16 MED ORDER — NITROGLYCERIN 0.4 MG SL SUBL
0.8000 mg | SUBLINGUAL_TABLET | Freq: Once | SUBLINGUAL | Status: AC
Start: 2022-11-16 — End: 2022-11-16
  Administered 2022-11-16: 0.8 mg via SUBLINGUAL

## 2022-11-16 MED ORDER — SODIUM CHLORIDE 0.9 % IV SOLN: INTRAVENOUS | Status: DC

## 2022-11-16 MED ORDER — METOPROLOL TARTRATE 5 MG/5ML IV SOLN
5.0000 mg | Freq: Once | INTRAVENOUS | Status: AC
  Administered 2022-11-16: 5 mg via INTRAVENOUS

## 2022-11-16 MED ORDER — IOHEXOL 350 MG/ML SOLN
75.0000 mL | Freq: Once | INTRAVENOUS | Status: AC | PRN
  Administered 2022-11-16: 75 mL via INTRAVENOUS

## 2022-11-16 NOTE — Telephone Encounter (Signed)
Transition Care Management Follow-up Telephone Call Date of discharge and from where: [place] 11/1 How have you been since you were released from the hospital? Pt is following up with providers Any questions or concerns? No  Items Reviewed: Did the pt receive and understand the discharge instructions provided? Yes  Medications obtained and verified? Yes  Other? No  Any new allergies since your discharge? No  Dietary orders reviewed? No Do you have support at home? No     Follow up appointments reviewed:  PCP Hospital f/u appt confirmed? Yes  Scheduled to see  on  @ . Specialist Hospital f/u appt confirmed? Yes  Scheduled to see  on  @ . Are transportation arrangements needed? No  If their condition worsens, is the pt aware to call PCP or go to the Emergency Dept.? Yes Was the patient provided with contact information for the PCP's office or ED? Yes Was to pt encouraged to call back with questions or concerns? Yes

## 2022-11-16 NOTE — Progress Notes (Signed)
Pt tolerated procedure well with no issues. Pt ABCs intact. Pt denies any complaints. Pt encouraged to drink plenty of water throughout the day. Pt ambulatory with steady gait.

## 2022-11-17 ENCOUNTER — Other Ambulatory Visit: Payer: Self-pay | Admitting: Emergency Medicine

## 2022-11-17 MED ORDER — ASPIRIN 81 MG PO TBEC: 81.0000 mg | DELAYED_RELEASE_TABLET | Freq: Every day | ORAL | Status: AC

## 2022-12-06 ENCOUNTER — Ambulatory Visit: Payer: Medicare Other | Admitting: Nurse Practitioner

## 2022-12-08 ENCOUNTER — Ambulatory Visit: Payer: Medicare Other | Admitting: Nurse Practitioner

## 2022-12-08 ENCOUNTER — Encounter: Payer: Self-pay | Admitting: Nurse Practitioner

## 2022-12-08 ENCOUNTER — Ambulatory Visit (INDEPENDENT_AMBULATORY_CARE_PROVIDER_SITE_OTHER): Payer: Medicare Other | Admitting: Nurse Practitioner

## 2022-12-08 VITALS — BP 122/78 | HR 69 | Temp 98.7°F | Ht 62.0 in | Wt 117.8 lb

## 2022-12-08 DIAGNOSIS — F331 Major depressive disorder, recurrent, moderate: Secondary | ICD-10-CM

## 2022-12-08 DIAGNOSIS — F411 Generalized anxiety disorder: Secondary | ICD-10-CM

## 2022-12-08 DIAGNOSIS — Z23 Encounter for immunization: Secondary | ICD-10-CM | POA: Diagnosis not present

## 2022-12-08 DIAGNOSIS — J301 Allergic rhinitis due to pollen: Secondary | ICD-10-CM | POA: Diagnosis not present

## 2022-12-08 DIAGNOSIS — K2100 Gastro-esophageal reflux disease with esophagitis, without bleeding: Secondary | ICD-10-CM

## 2022-12-08 DIAGNOSIS — I10 Essential (primary) hypertension: Secondary | ICD-10-CM | POA: Diagnosis not present

## 2022-12-08 MED ORDER — VENLAFAXINE HCL ER 37.5 MG PO CP24: 37.5000 mg | ORAL_CAPSULE | Freq: Every day | ORAL | 0 refills | Status: DC

## 2022-12-08 MED ORDER — FLUTICASONE PROPIONATE 50 MCG/ACT NA SUSP: 2.0000 | Freq: Every day | NASAL | 3 refills | Status: DC

## 2022-12-08 NOTE — Progress Notes (Signed)
[name], NP-C Phone: [phone]  Amber Brooks is a 77 y.o. female who presents today for follow up.   Discussed the use of AI scribe software for clinical note transcription with the patient, who gave verbal consent to proceed.  History of Present Illness   The patient, with a history of hypertension and anxiety, reports significant improvement in her health status over the past four weeks. She had previously experienced chest pain and had an ED visit, but since then, she has had a consultation with cardiology and undergone a CT calcium scan. The patient denies any recent episodes of chest pain, shortness of breath, or dizziness. She attributes previous chest discomfort to anxiety rather than cardiac issues. She is currently on Coreg, Norvasc, and Losartan, which were prescribed by cardiology, and has been tolerating these medications well.  In terms of her mental health, the patient reports a positive mood. She has been on a regimen of Effexor, which she has been gradually reducing with the goal of discontinuation. She has also started Wellbutrin, which she believes has been beneficial. She plans to further decrease the Effexor to 75mg  daily.  The patient also reports a history of acid reflux, for which she is taking Protonix. She has an upcoming appointment with a gastroenterologist. She has been keeping up with her vaccinations, but has declined the shingles vaccine due to concerns about side effects.      Social History   Tobacco Use  Smoking Status Never  Smokeless Tobacco Never    Current Outpatient Medications on File Prior to Visit  Medication Sig Dispense Refill   amLODipine (NORVASC) 10 MG tablet TAKE 1 TABLET BY MOUTH DAILY 100 tablet 0   aspirin EC 81 MG tablet Take 1 tablet (81 mg total) by mouth daily. Swallow whole.     buPROPion (WELLBUTRIN XL) 150 MG 24 hr tablet Take 1 tablet (150 mg total) by mouth daily. 90 tablet 0   carvedilol (COREG) 25 MG tablet TAKE 1  TABLET BY MOUTH TWICE  DAILY 200 tablet 0   EPINEPHrine 0.3 mg/0.3 mL IJ SOAJ injection Inject into the muscle once as needed.     ibuprofen (ADVIL) 800 MG tablet TAKE 1 TABLET BY MOUTH EVERY 8  HOURS AS NEEDED FOR HEADACHE 300 tablet 2   losartan (COZAAR) 25 MG tablet Take 1 tablet (25 mg total) by mouth daily. 90 tablet 3   Multiple Vitamin (MULTIVITAMIN WITH MINERALS) TABS tablet Take 1 tablet by mouth daily.     Omega 3 1000 MG CAPS Take 1,000 mg by mouth daily.     pantoprazole (PROTONIX) 40 MG tablet Take 40 mg by mouth 2 (two) times daily.     rosuvastatin (CRESTOR) 10 MG tablet TAKE 1 TABLET BY MOUTH AT  BEDTIME 100 tablet 0   traZODone (DESYREL) 50 MG tablet Take 1 tablet (50 mg total) by mouth at bedtime. 90 tablet 3   [DISCONTINUED] diphenhydrAMINE (BENADRYL) 25 MG tablet Take 25 mg by mouth daily as needed for allergies.     [DISCONTINUED] ezetimibe-simvastatin (VYTORIN) 10-40 MG per tablet Take 1 tablet by mouth at bedtime.      No current facility-administered medications on file prior to visit.    ROS see history of present illness  Objective  Physical Exam Vitals:   12/08/22 0957  BP: 122/78  Pulse: 69  Temp: 98.7 F (37.1 C)  SpO2: 99%    BP Readings from Last 3 Encounters:  12/08/22 122/78  11/16/22 (!) 143/69  11/08/22 134/68   Wt Readings from Last 3 Encounters:  12/08/22 117 lb 12.8 oz (53.4 kg)  11/08/22 113 lb (51.3 kg)  11/03/22 116 lb 6.4 oz (52.8 kg)    Physical Exam Constitutional:      General: She is not in acute distress.    Appearance: Normal appearance.  HENT:     Head: Normocephalic.  Cardiovascular:     Rate and Rhythm: Normal rate and regular rhythm.     Heart sounds: Normal heart sounds.  Pulmonary:     Effort: Pulmonary effort is normal.     Breath sounds: Normal breath sounds.  Skin:    General: Skin is warm and dry.  Neurological:     General: No focal deficit present.     Mental Status: She is alert.  Psychiatric:         Mood and Affect: Mood normal.        Behavior: Behavior normal.    Assessment/Plan: Please see individual problem list.  Moderate episode of recurrent major depressive disorder (HCC) Assessment & Plan: She is currently on Effexor 112.5 mg daily and Wellbutrin XL 150 mg daily, reporting an improvement in mood and expressing a desire to continue tapering off Effexor. PHQ and GAD- 0 today. We will reduce Effexor to 75mg  daily and continue Wellbutrin. We will consider further reducing Effexor to 37.5mg  daily after two weeks if she is comfortable. She will monitor for withdrawal symptoms and any worsening of thoughts or behavior changes, and encourage them to contact us if symptoms worsen.   Orders: -     Venlafaxine HCl ER; Take 1 capsule (37.5 mg total) by mouth daily with breakfast.  Dispense: 90 capsule; Refill: 0  Generalized anxiety disorder Assessment & Plan: See depression plan. GAD- 0 today.    Primary hypertension Assessment & Plan: Her hypertension is well controlled on Coreg, Norvasc, and Losartan with no reports of chest pain, shortness of breath, or dizziness. BP at goal today in office. We will continue her current medications and she will follow up with cardiology as needed.   Gastroesophageal reflux disease with esophagitis, unspecified whether hemorrhage Assessment & Plan: She is on Protonix 40 mg twice daily with an appointment with gastroenterology scheduled. We will continue Protonix and she will follow up with gastroenterology as scheduled.    Seasonal allergic rhinitis due to pollen -     Fluticasone Propionate; Place 2 sprays into both nostrils daily.  Dispense: 48 mL; Refill: 3  Need for influenza vaccination -     Flu Vaccine Trivalent High Dose (Fluad)   Return in about 3 months (around 02/26/2023) for Annual Exam. Please complete lab work 2-3 days prior.   [name], NP-C [facility] [employer] [facility]

## 2022-12-08 NOTE — Assessment & Plan Note (Signed)
She is on Protonix 40 mg twice daily with an appointment with gastroenterology scheduled. We will continue Protonix and she will follow up with gastroenterology as scheduled.

## 2022-12-08 NOTE — Assessment & Plan Note (Signed)
She is currently on Effexor 112.5 mg daily and Wellbutrin XL 150 mg daily, reporting an improvement in mood and expressing a desire to continue tapering off Effexor. PHQ and GAD- 0 today. We will reduce Effexor to 75mg  daily and continue Wellbutrin. We will consider further reducing Effexor to 37.5mg  daily after two weeks if she is comfortable. She will monitor for withdrawal symptoms and any worsening of thoughts or behavior changes, and encourage them to contact us if symptoms worsen.

## 2022-12-08 NOTE — Assessment & Plan Note (Signed)
Her hypertension is well controlled on Coreg, Norvasc, and Losartan with no reports of chest pain, shortness of breath, or dizziness. BP at goal today in office. We will continue her current medications and she will follow up with cardiology as needed.

## 2022-12-08 NOTE — Assessment & Plan Note (Signed)
See depression plan. GAD- 0 today.

## 2022-12-14 ENCOUNTER — Ambulatory Visit: Payer: Medicare Other | Admitting: Gastroenterology

## 2022-12-18 ENCOUNTER — Ambulatory Visit: Payer: PPO | Admitting: Physician Assistant

## 2022-12-27 ENCOUNTER — Encounter: Payer: Self-pay | Admitting: Nurse Practitioner

## 2022-12-29 ENCOUNTER — Ambulatory Visit: Payer: Self-pay | Admitting: Nurse Practitioner

## 2022-12-29 NOTE — Telephone Encounter (Signed)
Answer Assessment - Initial Assessment Questions N/A Patient has already spoken with PCP office, no triage needed.  Protocols used: No Contact or Duplicate Contact Call-A-AH   Disposition: [] ED /[] Urgent Care (no appt availability in office) / [] Appointment(In office/virtual)/ []  [place] [facility]/ [] Home Care/ [] Refused Recommended Disposition /[] [facility]/ [x]  Follow-up with PCP Additional Notes: Patient previously spoke with her PCP office regarding symptoms, no triage needed for this encounter.

## 2022-12-29 NOTE — Telephone Encounter (Signed)
FYI

## 2022-12-29 NOTE — Telephone Encounter (Signed)
Copied from CRM [id number]. Topic: Appointments - Scheduling Inquiry for Clinic >> Dec 29, 2022  3:31 PM Gurney Maxin H wrote: Reason for CRM: Patient called to schedule appointment per provider request because she messaged provider stating she has COVID and feels like she's not getting any better. Agent tried to schedule patient for Monday December 30th but patient declined stating she's going back to work and she's not sure if anything can be done anyway. She will see how she feels over the weekend and if she doesn't feel better she will call back.

## 2023-01-02 ENCOUNTER — Ambulatory Visit: Payer: PPO | Admitting: Gastroenterology

## 2023-01-04 ENCOUNTER — Other Ambulatory Visit: Payer: Self-pay | Admitting: Nurse Practitioner

## 2023-01-04 DIAGNOSIS — F331 Major depressive disorder, recurrent, moderate: Secondary | ICD-10-CM

## 2023-01-14 ENCOUNTER — Other Ambulatory Visit: Payer: Self-pay | Admitting: Family Medicine

## 2023-01-14 DIAGNOSIS — I10 Essential (primary) hypertension: Secondary | ICD-10-CM

## 2023-01-14 DIAGNOSIS — J301 Allergic rhinitis due to pollen: Secondary | ICD-10-CM

## 2023-01-14 DIAGNOSIS — I110 Hypertensive heart disease with heart failure: Secondary | ICD-10-CM

## 2023-01-14 DIAGNOSIS — E782 Mixed hyperlipidemia: Secondary | ICD-10-CM

## 2023-01-16 NOTE — Telephone Encounter (Signed)
 Requested medication (s) are due for refill today: yes  Requested medication (s) are on the active medication list: yes  Last refill:  10/26/22  Future visit scheduled: no  Notes to clinic:  Unable to refill per protocol,  another provider listed as PCP, routing for review.     Requested Prescriptions  Pending Prescriptions Disp Refills   montelukast  (SINGULAIR ) 10 MG tablet [Pharmacy Med Name: Montelukast  Sodium 10 MG Oral Tablet] 100 tablet 2    Sig: TAKE 1 TABLET BY MOUTH AT  BEDTIME     Pulmonology:  Leukotriene Inhibitors Passed - 01/16/2023 10:04 AM      Passed - Valid encounter within last 12 months    Recent Outpatient Visits           9 months ago Atypical chest pain   [place] [facility] [place], Marsa PARAS, DO   10 months ago Annual physical exam   [place] [facility] [place] Mars[place] PARAS, DO   1 year ago Viral URI   [place] [facility] [place] Marsa PARAS, DO   1 year ago Dizziness   [place] [facility], Rocky BRAVO, [place]   1 year ago Viral sinusitis   [place] [facility] [place], Angeline ORN, NP       Future Appointments             In 1 month Gretel App, NP [place] [facility] [employer] [facility], [facility]             rosuvastatin  (CRESTOR ) 10 MG tablet [employer] Med Name: Rosuvastatin  Calcium  10 MG Oral Tablet] 100 tablet 2    Sig: TAKE 1 TABLET BY MOUTH EVERY  NIGHT AT BEDTIME     Cardiovascular:  Antilipid - Statins 2 Failed - 01/16/2023 10:04 AM      Failed - Lipid Panel in normal range within the last 12 months    Cholesterol  Date Value Ref Range Status  02/20/2022 166 <200 mg/dL Final   LDL Cholesterol (Calc)  Date Value Ref Range Status  02/20/2022 65 mg/dL (calc) Final    Comment:    Reference range: <100 . Desirable range <100 mg/dL for primary prevention;   <70 mg/dL for patients with CHD or diabetic patients   with > or = 2 CHD risk factors. SABRA LDL-C is now calculated using the Martin-Hopkins  calculation, which is a validated novel method providing  better accuracy than the Friedewald equation in the  estimation of LDL-C.  Gladis APPLETHWAITE et al. SANDREA. 7986;689(80): 2061-2068  (http://education.[employer].com/faq/FAQ164)    HDL  Date Value Ref Range Status  02/20/2022 85 > OR = 50 mg/dL Final   Triglycerides  Date Value Ref Range Status  02/20/2022 77 <150 mg/dL Final         Passed - Cr in normal range and within 360 days    Creat  Date Value Ref Range Status  02/20/2022 0.78 0.60 - 1.00 mg/dL Final   Creatinine, Ser  Date Value Ref Range Status  11/15/2022 0.81 0.44 - 1.00 mg/dL Final         Passed - Patient is not pregnant      Passed - Valid encounter within last 12 months    Recent Outpatient Visits           9 months ago Atypical chest pain   [place] [facility] [place] Marsa PARAS, DO   10  months ago Annual physical exam   [place] [facility] [place] Marsa PARAS, DO   1 year ago Viral URI   [place] [facility] [place] Marsa PARAS, DO   1 year ago Dizziness   [place] [facility]ec[place], Rocky BRAVO, [place]   1 year ago Viral sinusitis   [place] [facility] [place], Angeline ORN, NP       Future Appointments             In 1 month Gretel App, NP [facility] [employer], [facility]             carvedilol  (COREG ) 25 MG tablet [employer] Med Name: Carvedilol  25 MG Oral Tablet] 200 tablet 2    Sig: TAKE 1 TABLET BY MOUTH TWICE  DAILY     Cardiovascular: Beta Blockers 3 Failed - 01/16/2023 10:04 AM      Failed - Valid encounter within last 6 months    Recent Outpatient Visits           9 months ago Atypical chest pain   [place] [facility] [place] Marsa PARAS, DO   10 months ago Annual physical exam    [place] [facility] [place] Marsa PARAS, DO   1 year ago Viral URI   [place] [facility] [place], Marsa PARAS, DO   1 year ago Dizziness   [place] [facility] [place], Rocky [place], [place]   1 year ago Viral sinusitis   [place] [facility] [place], Angeline ORN, NP       Future Appointments             In 1 month Gretel App, NP [place] [facility] [employer], [facility]            Passed - Cr in normal range and within 360 days    Creat  Date Value Ref Range Status  02/20/2022 0.78 0.60 - 1.00 mg/dL Final   Creatinine, Ser  Date Value Ref Range Status  11/15/2022 0.81 0.44 - 1.00 mg/dL Final         Passed - AST in normal range and within 360 days    AST  Date Value Ref Range Status  02/20/2022 21 10 - 35 U/L Final         Passed - ALT in normal range and within 360 days    ALT  Date Value Ref Range Status  02/20/2022 20 6 - 29 U/L Final         Passed - Last BP in normal range    BP Readings from Last 1 Encounters:  12/08/22 122/78         Passed - Last Heart Rate in normal range    Pulse Readings from Last 1 Encounters:  12/08/22 69          amLODipine  (NORVASC ) 10 MG tablet [Pharmacy Med Name: amLODIPine  Besylate 10 MG Oral Tablet] 100 tablet 2    Sig: TAKE 1 TABLET BY MOUTH DAILY     Cardiovascular: Calcium  Channel Blockers 2 Failed - 01/16/2023 10:04 AM      Failed - Valid encounter within last 6 months    Recent Outpatient Visits           9 months ago Atypical chest pain   [place] [facility], Marsa PARAS, DO  10 months ago Annual physical exam   [place] [facility] [place] Marsa PARAS, DO   1 year ago Viral URI   [place] [facility] [place]a PARAS, DO   1 year ago Dizziness   [place] [facility] [place], Rocky BRAVO, [place]   1 year ago Viral sinusitis    [place] [facility] [place], Angeline ORN, NP       Future Appointments             In 1 month Gretel App, NP [facility] [employer], [facility]            Passed - Last BP in normal range    BP Readings from Last 1 Encounters:  12/08/22 122/78         Passed - Last Heart Rate in normal range    Pulse Readings from Last 1 Encounters:  12/08/22 69

## 2023-01-30 DIAGNOSIS — I10 Essential (primary) hypertension: Secondary | ICD-10-CM | POA: Diagnosis not present

## 2023-01-30 DIAGNOSIS — E785 Hyperlipidemia, unspecified: Secondary | ICD-10-CM | POA: Diagnosis not present

## 2023-02-12 ENCOUNTER — Telehealth: Payer: Self-pay

## 2023-02-12 NOTE — Telephone Encounter (Signed)
 Copied from CRM [id number]. Topic: Clinical - Request for Lab/Test Order >> Feb 12, 2023  4:06 PM Amber Brooks wrote: Reason for CRM: Patient is coming in 02/27/23 for her labs for her physical. Please have these entered for her to complete.

## 2023-02-13 ENCOUNTER — Other Ambulatory Visit: Payer: Self-pay | Admitting: Nurse Practitioner

## 2023-02-13 DIAGNOSIS — Z1329 Encounter for screening for other suspected endocrine disorder: Secondary | ICD-10-CM

## 2023-02-13 DIAGNOSIS — M810 Age-related osteoporosis without current pathological fracture: Secondary | ICD-10-CM

## 2023-02-13 DIAGNOSIS — I10 Essential (primary) hypertension: Secondary | ICD-10-CM

## 2023-02-13 DIAGNOSIS — E782 Mixed hyperlipidemia: Secondary | ICD-10-CM

## 2023-02-16 ENCOUNTER — Other Ambulatory Visit: Payer: Self-pay | Admitting: Family Medicine

## 2023-02-16 DIAGNOSIS — F5101 Primary insomnia: Secondary | ICD-10-CM

## 2023-02-16 NOTE — Telephone Encounter (Signed)
Requested Prescriptions  Refused Prescriptions Disp Refills   traZODone (DESYREL) 50 MG tablet [Pharmacy Med Name: traZODone HCl 50 MG Oral Tablet] 90 tablet 3    Sig: TAKE 1 TABLET BY MOUTH AT  BEDTIME     Psychiatry: Antidepressants - Serotonin Modulator Failed - 02/16/2023  4:17 PM      Failed - Valid encounter within last 6 months    Recent Outpatient Visits           10 months ago Atypical chest pain   [place] [name], DO   11 months ago Annual physical exam   [place] [facility] [name], DO   1 year ago Viral URI   [place] [facility] [name], DO   1 year ago Dizziness   [place] [facility], [name], [place]   1 year ago Viral sinusitis   [place] [facility] [place], [place], [place]              Passed - Completed PHQ-2 or PHQ-9 in the last 360 days

## 2023-02-23 ENCOUNTER — Other Ambulatory Visit: Payer: PPO

## 2023-02-27 ENCOUNTER — Other Ambulatory Visit: Payer: Medicare Other

## 2023-02-28 ENCOUNTER — Ambulatory Visit: Payer: Medicare Other | Admitting: Physician Assistant

## 2023-02-28 ENCOUNTER — Other Ambulatory Visit: Payer: Medicare Other

## 2023-03-02 ENCOUNTER — Encounter: Payer: Medicare Other | Admitting: Nurse Practitioner

## 2023-03-02 ENCOUNTER — Encounter: Payer: PPO | Admitting: Family Medicine

## 2023-03-02 DIAGNOSIS — I10 Essential (primary) hypertension: Secondary | ICD-10-CM | POA: Diagnosis not present

## 2023-03-02 DIAGNOSIS — E785 Hyperlipidemia, unspecified: Secondary | ICD-10-CM | POA: Diagnosis not present

## 2023-03-04 DIAGNOSIS — I10 Essential (primary) hypertension: Secondary | ICD-10-CM | POA: Diagnosis not present

## 2023-03-04 DIAGNOSIS — E785 Hyperlipidemia, unspecified: Secondary | ICD-10-CM | POA: Diagnosis not present

## 2023-03-05 ENCOUNTER — Other Ambulatory Visit: Payer: Medicare Other

## 2023-03-09 ENCOUNTER — Ambulatory Visit: Payer: Medicare Other

## 2023-03-09 ENCOUNTER — Encounter: Payer: Medicare Other | Admitting: Nurse Practitioner

## 2023-03-16 ENCOUNTER — Encounter: Payer: Self-pay | Admitting: Nurse Practitioner

## 2023-03-16 ENCOUNTER — Other Ambulatory Visit

## 2023-03-16 ENCOUNTER — Ambulatory Visit: Admitting: Nurse Practitioner

## 2023-03-16 VITALS — BP 120/84 | HR 80 | Temp 98.2°F | Ht 62.0 in | Wt 114.2 lb

## 2023-03-16 DIAGNOSIS — E782 Mixed hyperlipidemia: Secondary | ICD-10-CM | POA: Diagnosis not present

## 2023-03-16 DIAGNOSIS — M810 Age-related osteoporosis without current pathological fracture: Secondary | ICD-10-CM

## 2023-03-16 DIAGNOSIS — J069 Acute upper respiratory infection, unspecified: Secondary | ICD-10-CM

## 2023-03-16 DIAGNOSIS — I10 Essential (primary) hypertension: Secondary | ICD-10-CM | POA: Diagnosis not present

## 2023-03-16 DIAGNOSIS — Z1329 Encounter for screening for other suspected endocrine disorder: Secondary | ICD-10-CM | POA: Diagnosis not present

## 2023-03-16 MED ORDER — AMOXICILLIN-POT CLAVULANATE 875-125 MG PO TABS: 1.0000 | ORAL_TABLET | Freq: Two times a day (BID) | ORAL | 0 refills | Status: DC

## 2023-03-16 MED ORDER — BENZONATATE 100 MG PO CAPS: 100.0000 mg | ORAL_CAPSULE | Freq: Three times a day (TID) | ORAL | 0 refills | Status: DC | PRN

## 2023-03-16 MED ORDER — LORATADINE 10 MG PO TABS: 10.0000 mg | ORAL_TABLET | Freq: Every day | ORAL | 0 refills | Status: DC

## 2023-03-16 NOTE — Patient Instructions (Signed)
 Increase fluid intake. Take the prescribed medication

## 2023-03-16 NOTE — Progress Notes (Signed)
 Established Patient Office Visit  Subjective:  Patient ID: Amber Brooks, female    DOB: 02-Apr-1945  Age: 78 y.o. MRN: [id number]  CC:  Chief Complaint  Patient presents with   Acute Visit    Lingering covid symptoms cough & congestion Had covid Christmas    Discussed the use of a AI scribe software for clinical note transcription with the patient, who gave verbal consent to proceed.  HPI  Amber Brooks is a 78 year old female who presents with persistent post-COVID congestion and cough.  She has been experiencing persistent congestion and a productive cough since having COVID-19 during Christmas. The cough occurs two to three times a day and produces mucus. She has tried various over-the-counter medications, including Mucinex, without relief.  No sinus pain, but she reports occasional nasal congestion and postnasal drip. No ear pressure or pain. She wakes up in the middle of the night feeling congested and experiences dry eyes.  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 She is currently taking Singulair, which she feels is not helping, and occasionally takes Benadryl. She has not been using other antihistamines like Zyrtec or cetirizine.  HPI   Past Medical History:  Diagnosis Date   Allergy 1968   Latex   Anxiety    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   Brain aneurysm    Cataract 2022   Complication of anesthesia    Depression    GERD (gastroesophageal reflux disease)    Headache(784.0)    migraines   History of chicken pox    Hyperlipidemia    Hypertension    Mitral valve prolapse    PONV (postoperative nausea and vomiting)    Shingles     Past Surgical History:  Procedure Laterality Date   ABDOMINAL HYSTERECTOMY  1987   ANEURYSM COILING     APPENDECTOMY  1967   BRAIN SURGERY  2014   CHOLECYSTECTOMY  2018   COLONOSCOPY N/A 09/27/2020   Procedure:  COLONOSCOPY;  Surgeon: Jaynie Collins, DO;  Location: [facility] ENDOSCOPY;  Service: Gastroenterology;  Laterality: N/A;   CRANIOTOMY Left 11/15/2012   Procedure: CRANIOTOMY INTRACRANIAL ANEURYSM FOR CAROTID;  Surgeon: Carmela Hurt, MD;  Location: [facility] NEURO ORS;  Service: Neurosurgery;  Laterality: Left;  LEFT Craniotomy for Aneurysm   ECTOPIC PREGNANCY SURGERY     ESOPHAGOGASTRODUODENOSCOPY N/A 09/27/2020   Procedure: ESOPHAGOGASTRODUODENOSCOPY (EGD);  Surgeon: Jaynie Collins, DO;  Location: [facility] ENDOSCOPY;  Service: Gastroenterology;  Laterality: N/A;   KNEE ARTHROSCOPY     LUMBAR LAMINECTOMY/DECOMPRESSION MICRODISCECTOMY N/A 04/27/2014   Procedure: LUMBAR LAMINECTOMY/DECOMPRESSION MICRODISCECTOMY LUMBAR FOUR-FIVE ;  Surgeon: Barnett Abu, MD;  Location: [facility] NEURO ORS;  Service: Neurosurgery;  Laterality: N/A;   RADIOLOGY WITH ANESTHESIA N/A 10/09/2012   Procedure: RADIOLOGY WITH ANESTHESIA;  Surgeon: Oneal Grout, MD;  Location: [facility] OR;  Service: Radiology;  Laterality: N/A;   RENAL ANGIOGRAPHY N/A 03/09/2020   Procedure: RENAL ANGIOGRAPHY;  Surgeon: Renford Dills, MD;  Location: [facility] INVASIVE CV LAB;  Service: Cardiovascular;  Laterality: N/A;   TONSILLECTOMY     VASCULAR SURGERY     stent placed    Family History  Problem Relation Age of Onset   Vision loss Mother    Stroke Father    Heart disease Father    Dementia Father    Arthritis Father    Hypertension Father    Breast cancer  Cousin    Dementia Brother    Depression Maternal Grandfather     Social History   Socioeconomic History   Marital status: Divorced    Spouse name: Not on file   Number of children: 1   Years of education: College   Highest education level: Associate degree: occupational, Scientist, product/process development, or vocational program  Occupational History   Occupation: [employer]  Tobacco Use   Smoking status: Never   Smokeless tobacco: Never  Vaping Use   Vaping status: Never Used   Substance and Sexual Activity   Alcohol use: Yes    Alcohol/week: 1.0 standard drink of alcohol    Types: 1 Glasses of wine per week    Comment: every evening   Drug use: No   Sexual activity: Not Currently  Other Topics Concern   Not on file  Social History Narrative   Right handed.  Caffeine decaf 1 cup in am.  Divorced.  1 adopted daughter.  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    Social Drivers of Corporate investment banker Strain: Low Risk  (03/15/2023)   Overall Financial Resource Strain (CARDIA)    Difficulty of Paying Living Expenses: Not hard at all  Food Insecurity: No Food Insecurity (03/15/2023)   Hunger Vital Sign    Worried About Running Out of Food in the Last Year: Never true    Ran Out of Food in the Last Year: Never true  Transportation Needs: No Transportation Needs (03/15/2023)   PRAPARE - Administrator, Civil Service (Medical): No    Lack of Transportation (Non-Medical): No  Physical Activity: Insufficiently Active (03/15/2023)   Exercise Vital Sign    Days of Exercise per Week: 4 days    Minutes of Exercise per Session: 20 min  Stress: No Stress Concern Present (03/15/2023)   [employer] of Occupational Health - Occupational Stress Questionnaire    Feeling of Stress : Only a little  Social Connections: Moderately Integrated (03/15/2023)   Social Connection and Isolation Panel [NHANES]    Frequency of Communication with Friends and Family: More than three times a week    Frequency of Social Gatherings with Friends and Family: Once a week    Attends Religious Services: More than 4 times per year    Active Member of [employer] or Organizations: Yes    Attends Banker Meetings: 1 to 4 times per year    Marital Status: Divorced  Intimate Partner Violence: Not At Risk (02/24/2022)   Humiliation, Afraid, Rape, and Kick questionnaire    Fear of Current or Ex-Partner: No    Emotionally Abused: No    Physically Abused: No    Sexually Abused: No      Outpatient Medications Prior to Visit  Medication Sig Dispense Refill   amLODipine (NORVASC) 10 MG tablet TAKE 1 TABLET BY MOUTH DAILY 100 tablet 3   aspirin EC 81 MG tablet Take 1 tablet (81 mg total) by mouth daily. Swallow whole.     buPROPion (WELLBUTRIN XL) 150 MG 24 hr tablet TAKE 1 TABLET BY MOUTH DAILY 90 tablet 3   carvedilol (COREG) 25 MG tablet TAKE 1 TABLET BY MOUTH TWICE  DAILY 200 tablet 3   EPINEPHrine 0.3 mg/0.3 mL IJ SOAJ injection Inject into the muscle once as needed.     fluticasone (FLONASE) 50 MCG/ACT nasal spray Place 2 sprays into both nostrils daily. 48 mL 3   ibuprofen (ADVIL) 800 MG tablet TAKE 1 TABLET BY MOUTH EVERY 8  HOURS AS NEEDED FOR HEADACHE 300 tablet 2   montelukast (SINGULAIR) 10 MG tablet TAKE 1 TABLET BY MOUTH AT  BEDTIME 100 tablet 3   Multiple Vitamin (MULTIVITAMIN WITH MINERALS) TABS tablet Take 1 tablet by mouth daily.     Omega 3 1000 MG CAPS Take 1,000 mg by mouth daily.     pantoprazole (PROTONIX) 40 MG tablet Take 40 mg by mouth 2 (two) times daily.     rosuvastatin (CRESTOR) 10 MG tablet TAKE 1 TABLET BY MOUTH EVERY  NIGHT AT BEDTIME 100 tablet 3   traZODone (DESYREL) 50 MG tablet Take 1 tablet (50 mg total) by mouth at bedtime. 90 tablet 3   venlafaxine XR (EFFEXOR XR) 37.5 MG 24 hr capsule Take 1 capsule (37.5 mg total) by mouth daily with breakfast. 90 capsule 0   No facility-administered medications prior to visit.    Allergies  Allergen Reactions   Hydromorphone Itching   Latex Anaphylaxis   Atorvastatin Other (See Comments)    Aches and hair loss   Clonidine Cough   Clonidine Derivatives Cough   Lisinopril Cough   Silenor [Doxepin] Other (See Comments)    jittery    ROS Review of Systems Negative unless indicated in HPI.    Objective:    Physical Exam HENT:     Right Ear: Tympanic membrane normal. Tympanic membrane is not erythematous.     Left Ear: Tympanic membrane normal. Tympanic membrane is not  erythematous.     Nose:     Right Turbinates: Not enlarged.     Left Turbinates: Not enlarged.     Right Sinus: No maxillary sinus tenderness or frontal sinus tenderness.     Left Sinus: No maxillary sinus tenderness or frontal sinus tenderness.     Mouth/Throat:     Mouth: Mucous membranes are moist.     Pharynx: Postnasal drip present. No pharyngeal swelling, oropharyngeal exudate or posterior oropharyngeal erythema.     Tonsils: No tonsillar exudate.  Cardiovascular:     Rate and Rhythm: Normal rate and regular rhythm.  Pulmonary:     Effort: Pulmonary effort is normal.     Breath sounds: Normal breath sounds. No stridor. No wheezing.  Neurological:     General: No focal deficit present.     Mental Status: She is oriented to person, place, and time. Mental status is at baseline.  Psychiatric:        Mood and Affect: Mood normal.        Behavior: Behavior normal.        Thought Content: Thought content normal.        Judgment: Judgment normal.     BP 120/84   Pulse 80   Temp 98.2 F (36.8 C)   Ht 5\' 2"  (1.575 m)   Wt 114 lb 3.2 oz (51.8 kg)   SpO2 99%   BMI 20.89 kg/m  Wt Readings from Last 3 Encounters:  03/16/23 114 lb 3.2 oz (51.8 kg)  12/08/22 117 lb 12.8 oz (53.4 kg)  11/08/22 113 lb (51.3 kg)     Health Maintenance  Topic Date Due   Zoster Vaccines- Shingrix (1 of 2) Never done   DTaP/Tdap/Td (4 - Td or Tdap) 09/12/2022   Medicare Annual Wellness (AWV)  02/25/2023   COVID-19 Vaccine (4 - 2024-25 season) 04/01/2023 (Originally 09/03/2022)   Pneumonia Vaccine 23+ Years old  Completed   INFLUENZA VACCINE  Completed   DEXA SCAN  Completed   Hepatitis C Screening  Completed   HPV VACCINES  Aged Out   Colonoscopy  Discontinued    There are no preventive care reminders to display for this patient.  Lab Results  Component Value Date   TSH 0.95 03/16/2023   Lab Results  Component Value Date   WBC 6.9 03/16/2023   HGB 12.5 03/16/2023   HCT 37.0 03/16/2023    MCV 91.6 03/16/2023   PLT 294 03/16/2023   Lab Results  Component Value Date   NA 139 03/16/2023   K 3.7 03/16/2023   CO2 26 03/16/2023   GLUCOSE 101 (H) 03/16/2023   BUN 16 03/16/2023   CREATININE 0.64 03/16/2023   BILITOT 0.4 03/16/2023   ALKPHOS 71 06/15/2016   AST 21 03/16/2023   ALT 18 03/16/2023   PROT 7.0 03/16/2023   ALBUMIN 4.4 06/15/2016   CALCIUM 9.7 03/16/2023   ANIONGAP 10 11/15/2022   EGFR 83 02/14/2021   Lab Results  Component Value Date   CHOL 178 03/16/2023   Lab Results  Component Value Date   HDL 84 03/16/2023   Lab Results  Component Value Date   LDLCALC 74 03/16/2023   Lab Results  Component Value Date   TRIG 110 03/16/2023   Lab Results  Component Value Date   CHOLHDL 2.1 03/16/2023   Lab Results  Component Value Date   HGBA1C 5.4 02/20/2022      Assessment & Plan:  URI with cough and congestion Assessment & Plan: Chronic postnasal drip likely due to seasonal allergies, worsened post-COVID infection. Current treatment with Singulair ineffective.  -Will start on Augmentin and Tessalon Perles - Start over-the-counter antihistamine such as cetirizine, Zyrtec, or Claritin at nighttime. - Encourage increased fluid intake.   Orders: -     CBC with Differential/Platelet -     Comprehensive metabolic panel -     Lipid panel -     TSH -     VITAMIN D 25 Hydroxy (Vit-D Deficiency, Fractures)  Age-related osteoporosis without current pathological fracture -     CBC with Differential/Platelet -     Comprehensive metabolic panel -     Lipid panel -     TSH -     VITAMIN D 25 Hydroxy (Vit-D Deficiency, Fractures)  Thyroid disorder screen -     CBC with Differential/Platelet -     Comprehensive metabolic panel -     Lipid panel -     TSH -     VITAMIN D 25 Hydroxy (Vit-D Deficiency, Fractures)  Mixed hyperlipidemia -     CBC with Differential/Platelet -     Comprehensive metabolic panel -     Lipid panel -     TSH -      VITAMIN D 25 Hydroxy (Vit-D Deficiency, Fractures)  Primary hypertension -     CBC with Differential/Platelet -     Comprehensive metabolic panel -     Lipid panel -     TSH -     VITAMIN D 25 Hydroxy (Vit-D Deficiency, Fractures)  Other orders -     Amoxicillin-Pot Clavulanate; Take 1 tablet by mouth 2 (two) times daily.  Dispense: 20 tablet; Refill: 0 -     Loratadine; Take 1 tablet (10 mg total) by mouth daily.  Dispense: 30 tablet; Refill: 0 -     Benzonatate; Take 1 capsule (100 mg total) by mouth 3 (three) times daily as needed.  Dispense: 30 capsule; Refill: 0    Follow-up: No follow-ups on file.  Kara Dies, NP

## 2023-03-17 LAB — CBC WITH DIFFERENTIAL/PLATELET
Absolute Lymphocytes: 1656 {cells}/uL (ref 850–3900)
Absolute Monocytes: 642 {cells}/uL (ref 200–950)
Basophils Absolute: 41 {cells}/uL (ref 0–200)
Basophils Relative: 0.6 %
Eosinophils Absolute: 331 {cells}/uL (ref 15–500)
Eosinophils Relative: 4.8 %
HCT: 37 % (ref 35.0–45.0)
Hemoglobin: 12.5 g/dL (ref 11.7–15.5)
MCH: 30.9 pg (ref 27.0–33.0)
MCHC: 33.8 g/dL (ref 32.0–36.0)
MCV: 91.6 fL (ref 80.0–100.0)
MPV: 10.7 fL (ref 7.5–12.5)
Monocytes Relative: 9.3 %
Neutro Abs: 4230 {cells}/uL (ref 1500–7800)
Neutrophils Relative %: 61.3 %
Platelets: 294 10*3/uL (ref 140–400)
RBC: 4.04 10*6/uL (ref 3.80–5.10)
RDW: 12.2 % (ref 11.0–15.0)
Total Lymphocyte: 24 %
WBC: 6.9 10*3/uL (ref 3.8–10.8)

## 2023-03-17 LAB — COMPREHENSIVE METABOLIC PANEL
AG Ratio: 1.9 (calc) (ref 1.0–2.5)
ALT: 18 U/L (ref 6–29)
AST: 21 U/L (ref 10–35)
Albumin: 4.6 g/dL (ref 3.6–5.1)
Alkaline phosphatase (APISO): 63 U/L (ref 37–153)
BUN: 16 mg/dL (ref 7–25)
CO2: 26 mmol/L (ref 20–32)
Calcium: 9.7 mg/dL (ref 8.6–10.4)
Chloride: 102 mmol/L (ref 98–110)
Creat: 0.64 mg/dL (ref 0.60–1.00)
Globulin: 2.4 g/dL (ref 1.9–3.7)
Glucose, Bld: 101 mg/dL — ABNORMAL HIGH (ref 65–99)
Potassium: 3.7 mmol/L (ref 3.5–5.3)
Sodium: 139 mmol/L (ref 135–146)
Total Bilirubin: 0.4 mg/dL (ref 0.2–1.2)
Total Protein: 7 g/dL (ref 6.1–8.1)

## 2023-03-17 LAB — LIPID PANEL
Cholesterol: 178 mg/dL (ref <200)
HDL: 84 mg/dL (ref >=50)
LDL Cholesterol (Calc): 74 mg/dL
Non-HDL Cholesterol (Calc): 94 mg/dL (ref <130)
Total CHOL/HDL Ratio: 2.1 (calc) (ref <5.0)
Triglycerides: 110 mg/dL (ref <150)

## 2023-03-17 LAB — TSH: TSH: 0.95 m[IU]/L (ref 0.40–4.50)

## 2023-03-17 LAB — VITAMIN D 25 HYDROXY (VIT D DEFICIENCY, FRACTURES): Vit D, 25-Hydroxy: 103 ng/mL — ABNORMAL HIGH (ref 30–100)

## 2023-03-19 ENCOUNTER — Encounter: Payer: Self-pay | Admitting: Nurse Practitioner

## 2023-03-28 DIAGNOSIS — J069 Acute upper respiratory infection, unspecified: Secondary | ICD-10-CM | POA: Insufficient documentation

## 2023-03-28 NOTE — Assessment & Plan Note (Signed)
 Chronic postnasal drip likely due to seasonal allergies, worsened post-COVID infection. Current treatment with Singulair ineffective.  -Will start on Augmentin and Tessalon Perles - Start over-the-counter antihistamine such as cetirizine, Zyrtec, or Claritin at nighttime. - Encourage increased fluid intake.

## 2023-04-02 DIAGNOSIS — I10 Essential (primary) hypertension: Secondary | ICD-10-CM | POA: Diagnosis not present

## 2023-04-02 DIAGNOSIS — E785 Hyperlipidemia, unspecified: Secondary | ICD-10-CM | POA: Diagnosis not present

## 2023-04-03 DIAGNOSIS — I10 Essential (primary) hypertension: Secondary | ICD-10-CM | POA: Diagnosis not present

## 2023-04-03 DIAGNOSIS — E785 Hyperlipidemia, unspecified: Secondary | ICD-10-CM | POA: Diagnosis not present

## 2023-04-04 ENCOUNTER — Encounter: Admitting: Nurse Practitioner

## 2023-04-06 ENCOUNTER — Ambulatory Visit: Admitting: Nurse Practitioner

## 2023-04-07 ENCOUNTER — Other Ambulatory Visit: Payer: Self-pay | Admitting: Nurse Practitioner

## 2023-04-13 ENCOUNTER — Ambulatory Visit (INDEPENDENT_AMBULATORY_CARE_PROVIDER_SITE_OTHER): Admitting: Nurse Practitioner

## 2023-04-13 ENCOUNTER — Encounter: Payer: Self-pay | Admitting: Nurse Practitioner

## 2023-04-13 ENCOUNTER — Encounter: Admitting: Nurse Practitioner

## 2023-04-13 VITALS — BP 116/60 | HR 73 | Temp 97.9°F | Ht 62.0 in | Wt 116.6 lb

## 2023-04-13 DIAGNOSIS — F5101 Primary insomnia: Secondary | ICD-10-CM | POA: Diagnosis not present

## 2023-04-13 DIAGNOSIS — E559 Vitamin D deficiency, unspecified: Secondary | ICD-10-CM

## 2023-04-13 DIAGNOSIS — I10 Essential (primary) hypertension: Secondary | ICD-10-CM | POA: Diagnosis not present

## 2023-04-13 DIAGNOSIS — Z1231 Encounter for screening mammogram for malignant neoplasm of breast: Secondary | ICD-10-CM

## 2023-04-13 DIAGNOSIS — K2100 Gastro-esophageal reflux disease with esophagitis, without bleeding: Secondary | ICD-10-CM

## 2023-04-13 DIAGNOSIS — F331 Major depressive disorder, recurrent, moderate: Secondary | ICD-10-CM | POA: Diagnosis not present

## 2023-04-13 DIAGNOSIS — R519 Headache, unspecified: Secondary | ICD-10-CM | POA: Diagnosis not present

## 2023-04-13 DIAGNOSIS — E782 Mixed hyperlipidemia: Secondary | ICD-10-CM | POA: Diagnosis not present

## 2023-04-13 MED ORDER — ESOMEPRAZOLE MAGNESIUM 40 MG PO CPDR: 40.0000 mg | DELAYED_RELEASE_CAPSULE | Freq: Two times a day (BID) | ORAL | 3 refills | Status: DC

## 2023-04-13 MED ORDER — VENLAFAXINE HCL ER 75 MG PO CP24: 75.0000 mg | ORAL_CAPSULE | Freq: Every day | ORAL | 3 refills | Status: DC

## 2023-04-13 MED ORDER — IBUPROFEN 800 MG PO TABS: ORAL_TABLET | ORAL | 2 refills | Status: DC

## 2023-04-13 MED ORDER — TRAZODONE HCL 50 MG PO TABS: 50.0000 mg | ORAL_TABLET | Freq: Every day | ORAL | 3 refills | Status: DC

## 2023-04-13 NOTE — Progress Notes (Signed)
 Bluford Burkitt, NP-C Phone: [phone]  Amber Brooks is a 78 y.o. female who presents today for follow up.   Discussed the use of AI scribe software for clinical note transcription with the patient, who gave verbal consent to proceed.  History of Present Illness   Amber Brooks is a 78 year old female with hypertension who presents for medication management and blood pressure monitoring. She completed lab work prior to her appointment and would like to review the results.   She has been experiencing variable blood pressure readings at home, ranging from 160/90 mmHg to 113/68 mmHg, and questions the accuracy of her home monitor. Her blood pressure was 154/73 mmHg yesterday. She is currently on amlodipine 10 mg once daily and carvedilol 25 mg twice daily. Losartan was previously part of her regimen but has been discontinued.  She experiences leg swelling, particularly in the afternoons at work, which she attributes to prolonged standing. The swelling resolves overnight. She is concerned about fluid retention but recognizes that swelling is a known side effect of amlodipine.  She takes trazodone nightly for sleep, which aids her rest, although she wakes twice nightly to urinate. For mood, she takes venlafaxine 75 mg daily, which she finds effective after discontinuing Wellbutrin due to adverse effects. She reports improved activity levels, working four days a week and engaging in more activities.  No chest pain, shortness of breath, dizziness, abdominal pain, muscle aches, or changes in mood. She reports congestion and a cough, especially at night, which she attributes to allergies. She uses Flonase and Claritin for allergy management.  She has a history of COVID-19, which left her fatigued for a month. She takes vitamin D supplements but is considering reducing her intake due to elevated levels. She is also on rosuvastatin for cholesterol management and takes aspirin and a B vitamin to help  with blood pressure.      Social History   Tobacco Use  Smoking Status Never  Smokeless Tobacco Never    Current Outpatient Medications on File Prior to Visit  Medication Sig Dispense Refill   amLODipine (NORVASC) 10 MG tablet TAKE 1 TABLET BY MOUTH DAILY 100 tablet 3   amoxicillin-clavulanate (AUGMENTIN) 875-125 MG tablet Take 1 tablet by mouth 2 (two) times daily. 20 tablet 0   aspirin EC 81 MG tablet Take 1 tablet (81 mg total) by mouth daily. Swallow whole.     benzonatate (TESSALON PERLES) 100 MG capsule Take 1 capsule (100 mg total) by mouth 3 (three) times daily as needed. 30 capsule 0   carvedilol (COREG) 25 MG tablet TAKE 1 TABLET BY MOUTH TWICE  DAILY 200 tablet 3   EPINEPHrine 0.3 mg/0.3 mL IJ SOAJ injection Inject into the muscle once as needed.     fluticasone (FLONASE) 50 MCG/ACT nasal spray Place 2 sprays into both nostrils daily. 48 mL 3   loratadine (CLARITIN) 10 MG tablet TAKE 1 TABLET BY MOUTH EVERY DAY 90 tablet 1   Multiple Vitamin (MULTIVITAMIN WITH MINERALS) TABS tablet Take 1 tablet by mouth daily.     Omega 3 1000 MG CAPS Take 1,000 mg by mouth daily.     rosuvastatin (CRESTOR) 10 MG tablet TAKE 1 TABLET BY MOUTH EVERY  NIGHT AT BEDTIME 100 tablet 3   [DISCONTINUED] diphenhydrAMINE (BENADRYL) 25 MG tablet Take 25 mg by mouth daily as needed for allergies.     [DISCONTINUED] ezetimibe-simvastatin (VYTORIN) 10-40 MG per tablet Take 1 tablet by mouth at bedtime.  No current facility-administered medications on file prior to visit.     ROS see history of present illness  Objective  Physical Exam Vitals:   04/18/23 1147  BP: 116/60  Pulse: 73  Temp: 97.9 F (36.6 C)  SpO2: 98%    BP Readings from Last 3 Encounters:  04/18/23 116/60  03/16/23 120/84  12/08/22 122/78   Wt Readings from Last 3 Encounters:  04/18/23 116 lb 9.6 oz (52.9 kg)  03/16/23 114 lb 3.2 oz (51.8 kg)  12/08/22 117 lb 12.8 oz (53.4 kg)    Physical Exam Constitutional:       General: She is not in acute distress.    Appearance: Normal appearance.  HENT:     Head: Normocephalic.     Right Ear: Tympanic membrane normal.     Left Ear: Tympanic membrane normal.     Nose: Nose normal.     Mouth/Throat:     Mouth: Mucous membranes are moist.     Pharynx: Oropharynx is clear.  Eyes:     Conjunctiva/sclera: Conjunctivae normal.     Pupils: Pupils are equal, round, and reactive to light.  Neck:     Thyroid: No thyromegaly.  Cardiovascular:     Rate and Rhythm: Normal rate and regular rhythm.     Heart sounds: Normal heart sounds.  Pulmonary:     Effort: Pulmonary effort is normal.     Breath sounds: Normal breath sounds.  Abdominal:     General: Abdomen is flat. Bowel sounds are normal.     Palpations: Abdomen is soft. There is no mass.     Tenderness: There is no abdominal tenderness.  Musculoskeletal:        General: Normal range of motion.  Lymphadenopathy:     Cervical: No cervical adenopathy.  Skin:    General: Skin is warm and dry.     Findings: No rash.  Neurological:     General: No focal deficit present.     Mental Status: She is alert.  Psychiatric:        Mood and Affect: Mood normal.        Behavior: Behavior normal.     Assessment/Plan: Please see individual problem list.  Primary hypertension Assessment & Plan: Blood pressure is variable, elevated at home but at goal in-office. Leg swelling likely from amlodipine resolves overnight with no significant fluid retention. Losartan discontinued by Cardiology. Continue amlodipine 10 mg daily and carvedilol 25 mg twice daily. She will continue to monitor her blood pressure at home and monitor for increased swelling.    Mixed hyperlipidemia Assessment & Plan: Cholesterol is well-controlled with LDL at 74. She is advised on maintaining a heart-healthy lifestyle. Continue Crestor (rosuvastatin), maintain a heart-healthy lifestyle, and continue aspirin as recommended by  cardiology.   Moderate episode of recurrent major depressive disorder (HCC) Assessment & Plan: Her depression is well-managed on venlafaxine 75 mg with improved mood and activity levels. She has discontinued Wellbutrin XL. Continue venlafaxine 75 mg daily. Encouraged to contact if worsening symptoms, unusual behavior changes or suicidal thoughts occur.   Orders: -     Venlafaxine HCl ER; Take 1 capsule (75 mg total) by mouth daily with breakfast.  Dispense: 100 capsule; Refill: 3  Primary insomnia Assessment & Plan: Trazodone is effective for sleep, and the current dose is sufficient. Continue trazodone as prescribed.  Orders: -     traZODone HCl; Take 1 tablet (50 mg total) by mouth at bedtime.  Dispense: 100 tablet; Refill:  3  Vitamin D deficiency Assessment & Plan: Vitamin D level is slightly elevated at 103. She is advised to reduce intake. Reduce vitamin D supplementation.   Nonintractable headache, unspecified chronicity pattern, unspecified headache type -     Ibuprofen; TAKE 1 TABLET BY MOUTH EVERY 8  HOURS AS NEEDED FOR HEADACHE  Dispense: 300 tablet; Refill: 2  Gastroesophageal reflux disease with esophagitis, unspecified whether hemorrhage -     Esomeprazole Magnesium; Take 1 capsule (40 mg total) by mouth 2 (two) times daily before a meal.  Dispense: 180 capsule; Refill: 3  Screening mammogram for breast cancer -     3D Screening Mammogram, Left and Right; Future    Return in about 6 months (around 10/13/2023) for Follow up.   Bluford Burkitt, NP-C [facility] [place]

## 2023-04-13 NOTE — Patient Instructions (Addendum)
 YOUR MAMMOGRAM IS DUE, PLEASE CALL AND GET THIS SCHEDULED! [facility] - call [phone]   BP Readings from Last 10 Encounters:  03/16/23 120/84  12/08/22 122/78  11/16/22 (!) 143/69  11/08/22 134/68  11/03/22 (!) 161/72  11/03/22 (!) 140/70  05/05/22 138/72  03/31/22 122/70  02/24/22 110/64  02/24/22 110/64

## 2023-04-18 ENCOUNTER — Encounter: Payer: Self-pay | Admitting: Nurse Practitioner

## 2023-04-18 DIAGNOSIS — E559 Vitamin D deficiency, unspecified: Secondary | ICD-10-CM | POA: Insufficient documentation

## 2023-04-18 NOTE — Assessment & Plan Note (Signed)
 Trazodone is effective for sleep, and the current dose is sufficient. Continue trazodone as prescribed.

## 2023-04-18 NOTE — Assessment & Plan Note (Signed)
 Blood pressure is variable, elevated at home but at goal in-office. Leg swelling likely from amlodipine resolves overnight with no significant fluid retention. Losartan discontinued by Cardiology. Continue amlodipine 10 mg daily and carvedilol 25 mg twice daily. She will continue to monitor her blood pressure at home and monitor for increased swelling.

## 2023-04-18 NOTE — Assessment & Plan Note (Signed)
 Her depression is well-managed on venlafaxine 75 mg with improved mood and activity levels. She has discontinued Wellbutrin XL. Continue venlafaxine 75 mg daily. Encouraged to contact if worsening symptoms, unusual behavior changes or suicidal thoughts occur.

## 2023-04-18 NOTE — Assessment & Plan Note (Signed)
 Vitamin D level is slightly elevated at 103. She is advised to reduce intake. Reduce vitamin D supplementation.

## 2023-04-18 NOTE — Assessment & Plan Note (Signed)
 Cholesterol is well-controlled with LDL at 74. She is advised on maintaining a heart-healthy lifestyle. Continue Crestor (rosuvastatin), maintain a heart-healthy lifestyle, and continue aspirin as recommended by cardiology.

## 2023-05-02 DIAGNOSIS — I10 Essential (primary) hypertension: Secondary | ICD-10-CM | POA: Diagnosis not present

## 2023-05-02 DIAGNOSIS — E785 Hyperlipidemia, unspecified: Secondary | ICD-10-CM | POA: Diagnosis not present

## 2023-05-03 DIAGNOSIS — E785 Hyperlipidemia, unspecified: Secondary | ICD-10-CM | POA: Diagnosis not present

## 2023-05-03 DIAGNOSIS — I10 Essential (primary) hypertension: Secondary | ICD-10-CM | POA: Diagnosis not present

## 2023-05-06 DIAGNOSIS — N3000 Acute cystitis without hematuria: Secondary | ICD-10-CM | POA: Diagnosis not present

## 2023-05-06 DIAGNOSIS — R300 Dysuria: Secondary | ICD-10-CM | POA: Diagnosis not present

## 2023-05-09 DIAGNOSIS — M1811 Unilateral primary osteoarthritis of first carpometacarpal joint, right hand: Secondary | ICD-10-CM | POA: Diagnosis not present

## 2023-05-24 ENCOUNTER — Ambulatory Visit
Admission: RE | Admit: 2023-05-24 | Discharge: 2023-05-24 | Disposition: A | Discharge status: Home / Self Care | Source: Ambulatory Visit | Attending: Physician Assistant | Admitting: Physician Assistant

## 2023-05-24 ENCOUNTER — Other Ambulatory Visit: Payer: Self-pay

## 2023-05-24 VITALS — BP 184/75 | HR 70 | Temp 97.9°F | Resp 18 | Ht 62.0 in | Wt 113.0 lb

## 2023-05-24 DIAGNOSIS — R300 Dysuria: Secondary | ICD-10-CM

## 2023-05-24 DIAGNOSIS — N390 Urinary tract infection, site not specified: Secondary | ICD-10-CM

## 2023-05-24 LAB — POCT URINALYSIS DIP (MANUAL ENTRY)
Bilirubin, UA: NEGATIVE
Blood, UA: NEGATIVE
Glucose, UA: 100 mg/dL — AB
Nitrite, UA: POSITIVE — AB
Protein Ur, POC: 30 mg/dL — AB
Spec Grav, UA: 1.015 (ref 1.010–1.025)
Urobilinogen, UA: 1 U/dL
pH, UA: 5 (ref 5.0–8.0)

## 2023-05-24 MED ORDER — SULFAMETHOXAZOLE-TRIMETHOPRIM 800-160 MG PO TABS: 1.0000 | ORAL_TABLET | Freq: Two times a day (BID) | ORAL | 0 refills | Status: AC

## 2023-05-24 MED ORDER — PHENAZOPYRIDINE HCL 100 MG PO TABS: 100.0000 mg | ORAL_TABLET | Freq: Three times a day (TID) | ORAL | 0 refills | Status: DC | PRN

## 2023-05-24 NOTE — Discharge Instructions (Addendum)
 Based on your symptoms and results of the urinalysis I believe you have a UTI I recommend the following:  I have sent in a script for Bactrim to be taken twice per day for 5 days  Please finish the entire course of the antibiotic even if you are feeling better before it is completed unless you develop an allergic reaction or are told by a medical provider to stop taking it. We have sent a sample of your urine off for a urine culture.  This will help us  determine what bacteria is causing your symptoms as well as the most appropriate antibiotic to treat it.  If we need to make any adjustments to your medication regimen and new medication will be sent to the pharmacy on file and you will be updated via phone call in MyChart. Stay well hydrated (at least 75 oz of water per day) and avoid holding your urine for prolonged periods of time. If you have any of the following please return to urgent care or go to the emergency room: Persistent symptoms, fever, trouble urinating or inability to urinate, confusion, flank pain.

## 2023-05-24 NOTE — ED Provider Notes (Signed)
 [facility]    CSN: [id number] Arrival date & time: 05/24/23  1719      History   Chief Complaint Chief Complaint  Patient presents with   Urinary Frequency    UTI,  pain. ,Urgent - Entered by patient    HPI Amber Brooks is a 78 y.o. female.   HPI Patient presents today with concerns for UTI She states her symptoms started last night with burning and discomfort with urination She has been taking AZO to assist with symptoms   She was seen on 05/06/23 for similar symptoms at [facility] clinic UC and treated with macrobid  Unsure if Macrobid was an appropriate regimen as I am unable to see if there was a urine culture or results from this visit.   Past Medical History:  Diagnosis Date   Allergy 1968   Latex   Anxiety    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   Brain aneurysm    Cataract 2022   Complication of anesthesia    Depression    GERD (gastroesophageal reflux disease)    Headache(784.0)    migraines   History of chicken pox    Hyperlipidemia    Hypertension    Mitral valve prolapse    PONV (postoperative nausea and vomiting)    Shingles     Patient Active Problem List   Diagnosis Date Noted   Vitamin D  deficiency 04/18/2023   URI with cough and congestion 03/28/2023   Generalized anxiety disorder 11/03/2022   Moderate episode of recurrent major depressive disorder (HCC) 11/03/2022   Dysphagia 11/03/2022   Atherosclerosis of renal artery (HCC) 03/20/2021   Lumbar and sacral osteoarthritis 03/22/2020   Age-related osteoporosis without current pathological fracture 02/06/2020   Allergic rhinitis due to pollen 02/06/2020   Diastolic dysfunction 02/06/2020   History of adenomatous polyp of colon 02/06/2020   Primary insomnia 02/06/2020   Irritable bowel syndrome 02/06/2020   Menopausal and postmenopausal disorder 02/06/2020   Migraine with aura 02/06/2020   Sleep walking 02/06/2020   S/P cholecystectomy 12/18/2019   Aneurysm (HCC) 12/18/2019   Mixed  hyperlipidemia 12/18/2019   Alcohol use 06/18/2016   Benzodiazepine overdose 06/15/2016   Lumbar radiculopathy 04/26/2014   Chest pain 04/06/2014   Hypertension    Mitral valve prolapse    Gastroesophageal reflux disease with esophagitis    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/2013   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   Aneurysm, cerebral, nonruptured 11/18/2012    Past Surgical History:  Procedure Laterality Date   ABDOMINAL HYSTERECTOMY  1987   ANEURYSM COILING     APPENDECTOMY  1967   BRAIN SURGERY  2014   CHOLECYSTECTOMY  2018   COLONOSCOPY N/A 09/27/2020   Procedure: COLONOSCOPY;  Surgeon: Quintin Buckle, DO;  Location: [facility] ENDOSCOPY;  Service: Gastroenterology;  Laterality: N/A;   CRANIOTOMY Left 11/15/2012   Procedure: CRANIOTOMY INTRACRANIAL ANEURYSM FOR CAROTID;  Surgeon: Pasty Bongo, MD;  Location: [facility] NEURO ORS;  Service: Neurosurgery;  Laterality: Left;  LEFT Craniotomy for Aneurysm   ECTOPIC PREGNANCY SURGERY     ESOPHAGOGASTRODUODENOSCOPY N/A 09/27/2020   Procedure: ESOPHAGOGASTRODUODENOSCOPY (EGD);  Surgeon: Quintin Buckle, DO;  Location: [facility] ENDOSCOPY;  Service: Gastroenterology;  Laterality: N/A;   KNEE ARTHROSCOPY     LUMBAR LAMINECTOMY/DECOMPRESSION MICRODISCECTOMY N/A 04/27/2014   Procedure: LUMBAR LAMINECTOMY/DECOMPRESSION MICRODISCECTOMY LUMBAR FOUR-FIVE ;  Surgeon: Elna Haggis, MD;  Location: [facility] NEURO ORS;  Service: Neurosurgery;  Laterality: N/A;   RADIOLOGY WITH ANESTHESIA N/A 10/09/2012   Procedure: RADIOLOGY WITH ANESTHESIA;  Surgeon:  Sanjeev K Deveshwar, MD;  Location: [facility] OR;  Service: Radiology;  Laterality: N/A;   RENAL ANGIOGRAPHY N/A 03/09/2020   Procedure: RENAL ANGIOGRAPHY;  Surgeon: Jackquelyn Mass, MD;  Location: [facility] INVASIVE CV LAB;  Service: Cardiovascular;  Laterality: N/A;   TONSILLECTOMY     VASCULAR SURGERY     stent placed    OB History   No obstetric history on file.      Home Medications    Prior to Admission medications    Medication Sig Start Date End Date Taking? Authorizing Provider  phenazopyridine (PYRIDIUM) 100 MG tablet Take 1 tablet (100 mg total) by mouth 3 (three) times daily as needed for pain. 05/24/23  Yes Shawn Dannenberg E, PA-C  sulfamethoxazole-trimethoprim (BACTRIM DS) 800-160 MG tablet Take 1 tablet by mouth 2 (two) times daily for 5 days. 05/24/23 05/29/23 Yes Azzan Butler E, PA-C  amLODipine  (NORVASC ) 10 MG tablet TAKE 1 TABLET BY MOUTH DAILY 01/16/23   Bluford Burkitt, NP  amoxicillin -clavulanate (AUGMENTIN ) 875-125 MG tablet Take 1 tablet by mouth 2 (two) times daily. 03/16/23   Tona Francis, NP  aspirin  EC 81 MG tablet Take 1 tablet (81 mg total) by mouth daily. Swallow whole. 11/17/22   Dunn, Elvia Hammans, PA-C  benzonatate  (TESSALON  PERLES) 100 MG capsule Take 1 capsule (100 mg total) by mouth 3 (three) times daily as needed. 03/16/23   Kaur, Charanpreet, NP  carvedilol  (COREG ) 25 MG tablet TAKE 1 TABLET BY MOUTH TWICE  DAILY 01/16/23   Bluford Burkitt, NP  EPINEPHrine  0.3 mg/0.3 mL IJ SOAJ injection Inject into the muscle once as needed. 07/21/21   [provider]  esomeprazole  (NEXIUM ) 40 MG capsule Take 1 capsule (40 mg total) by mouth 2 (two) times daily before a meal. 04/13/23   Bluford Burkitt, NP  fluticasone  (FLONASE ) 50 MCG/ACT nasal spray Place 2 sprays into both nostrils daily. 12/08/22   Bluford Burkitt, NP  ibuprofen  (ADVIL ) 800 MG tablet TAKE 1 TABLET BY MOUTH EVERY 8  HOURS AS NEEDED FOR HEADACHE 04/13/23   Bluford Burkitt, NP  loratadine  (CLARITIN ) 10 MG tablet TAKE 1 TABLET BY MOUTH EVERY DAY 04/09/23   Kaur, Charanpreet, NP  Multiple Vitamin (MULTIVITAMIN WITH MINERALS) TABS tablet Take 1 tablet by mouth daily.    [provider]  Omega 3 1000 MG CAPS Take 1,000 mg by mouth daily.    [provider]  rosuvastatin  (CRESTOR ) 10 MG tablet TAKE 1 TABLET BY MOUTH EVERY  NIGHT AT BEDTIME 01/16/23   Bluford Burkitt, NP  traZODone  (DESYREL ) 50 MG tablet Take 1 tablet (50 mg total) by mouth at  bedtime. 04/13/23   Bluford Burkitt, NP  venlafaxine  XR (EFFEXOR  XR) 75 MG 24 hr capsule Take 1 capsule (75 mg total) by mouth daily with breakfast. 04/13/23   Bluford Burkitt, NP  diphenhydrAMINE  (BENADRYL ) 25 MG tablet Take 25 mg by mouth daily as needed for allergies.  07/15/19  [provider]  ezetimibe -simvastatin  (VYTORIN ) 10-40 MG per tablet Take 1 tablet by mouth at bedtime.   07/15/19  [provider]    Family History Family History  Problem Relation Age of Onset   Vision loss Mother    Stroke Father    Heart disease Father    Dementia Father    Arthritis Father    Hypertension Father    Breast cancer Cousin    Dementia Brother    Depression Maternal Grandfather     Social History Social History   Tobacco Use  Smoking status: Never   Smokeless tobacco: Never  Vaping Use   Vaping status: Never Used  Substance Use Topics   Alcohol use: Yes    Alcohol/week: 1.0 standard drink of alcohol    Types: 1 Glasses of wine per week    Comment: every evening   Drug use: No     Allergies   Hydromorphone , Latex, Atorvastatin, Clonidine , Clonidine  derivatives, Lisinopril, and Silenor [doxepin]   Review of Systems Review of Systems  Constitutional:  Negative for chills and fever.  Gastrointestinal:  Negative for abdominal pain.  Genitourinary:  Positive for dysuria and frequency. Negative for flank pain and hematuria.     Physical Exam Triage Vital Signs ED Triage Vitals  Encounter Vitals Group     BP 05/24/23 1752 (!) 184/75     Systolic BP Percentile --      Diastolic BP Percentile --      Pulse Rate 05/24/23 1752 70     Resp 05/24/23 1752 18     Temp 05/24/23 1752 97.9 F (36.6 C)     Temp Source 05/24/23 1752 Oral     SpO2 05/24/23 1752 96 %     Weight 05/24/23 1752 113 lb (51.3 kg)     Height 05/24/23 1752 5\' 2"  (1.575 m)     Head Circumference --      Peak Flow --      Pain Score 05/24/23 1804 5     Pain Loc --      Pain Education --       Exclude from Growth Chart --    No data found.  Updated Vital Signs BP (!) 184/75 (BP Location: Right Arm)   Pulse 70   Temp 97.9 F (36.6 C) (Oral)   Resp 18   Ht 5\' 2"  (1.575 m)   Wt 113 lb (51.3 kg)   SpO2 96%   BMI 20.67 kg/m   Visual Acuity Right Eye Distance:   Left Eye Distance:   Bilateral Distance:    Right Eye Near:   Left Eye Near:    Bilateral Near:     Physical Exam Vitals reviewed.  Constitutional:      General: She is awake.     Appearance: Normal appearance. She is well-developed and well-groomed.  HENT:     Head: Normocephalic and atraumatic.  Eyes:     General: Lids are normal. Gaze aligned appropriately.     Extraocular Movements: Extraocular movements intact.     Conjunctiva/sclera: Conjunctivae normal.  Pulmonary:     Effort: Pulmonary effort is normal.  Neurological:     General: No focal deficit present.     Mental Status: She is alert and oriented to person, place, and time.     GCS: GCS eye subscore is 4. GCS verbal subscore is 5. GCS motor subscore is 6.     Cranial Nerves: No cranial nerve deficit, dysarthria or facial asymmetry.  Psychiatric:        Attention and Perception: Attention and perception normal.        Mood and Affect: Mood and affect normal.        Speech: Speech normal.        Behavior: Behavior normal. Behavior is cooperative.        Thought Content: Thought content normal.        Judgment: Judgment normal.      UC Treatments / Results  Labs (all labs ordered are listed, but only abnormal results are displayed) Labs  Reviewed  POCT URINALYSIS DIP (MANUAL ENTRY) - Abnormal; Notable for the following components:      Result Value   Color, UA orange (*)    Clarity, UA cloudy (*)    Glucose, UA =100 (*)    Ketones, POC UA trace (5) (*)    Protein Ur, POC =30 (*)    Nitrite, UA Positive (*)    Leukocytes, UA Trace (*)    All other components within normal limits  URINE CULTURE    EKG   Radiology No results  found.  Procedures Procedures (including critical care time)  Medications Ordered in UC Medications - No data to display  Initial Impression / Assessment and Plan / UC Course  I have reviewed the triage vital signs and the nursing notes.  Pertinent labs & imaging results that were available during my care of the patient were reviewed by me and considered in my medical decision making (see chart for details).      Final Clinical Impressions(s) / UC Diagnoses   Final diagnoses:  Dysuria  Urinary tract infection without hematuria, site unspecified   Patient presents today with concerns for UTI.  She reports discomfort with urination as well as increased urinary frequency.  She denies fever, chills, notable flank pain or difficulty urinating.  Urine dip results were notable for signs of UTI.  Will send urine culture off for susceptibility and ID.  Given limited improvement or potential reinfection following Macrobid will send in Bactrim p.o. twice daily x 5 days.  Will also send in Pyridium 100 mg p.o. 3 times daily as needed.  Recommend increasing hydration efforts and avoiding holding urine for prolonged periods of time.  Results of urine culture to dictate further management.  ED and return precautions reviewed and provided in after visit summary.  Follow-up as needed.    Discharge Instructions      Based on your symptoms and results of the urinalysis I believe you have a UTI I recommend the following:  I have sent in a script for Bactrim to be taken twice per day for 5 days  Please finish the entire course of the antibiotic even if you are feeling better before it is completed unless you develop an allergic reaction or are told by a medical provider to stop taking it. We have sent a sample of your urine off for a urine culture.  This will help us  determine what bacteria is causing your symptoms as well as the most appropriate antibiotic to treat it.  If we need to make any  adjustments to your medication regimen and new medication will be sent to the pharmacy on file and you will be updated via phone call in MyChart. Stay well hydrated (at least 75 oz of water per day) and avoid holding your urine for prolonged periods of time. If you have any of the following please return to urgent care or go to the emergency room: Persistent symptoms, fever, trouble urinating or inability to urinate, confusion, flank pain.     ED Prescriptions     Medication Sig Dispense Auth. Provider   sulfamethoxazole-trimethoprim (BACTRIM DS) 800-160 MG tablet Take 1 tablet by mouth 2 (two) times daily for 5 days. 10 tablet Darchelle Nunes E, PA-C   phenazopyridine (PYRIDIUM) 100 MG tablet Take 1 tablet (100 mg total) by mouth 3 (three) times daily as needed for pain. 10 tablet Camyla Camposano E, PA-C      PDMP not reviewed this encounter.   Analysia Dungee,  Pearla Bottom, PA-C 05/28/23 [phone]

## 2023-05-24 NOTE — ED Triage Notes (Signed)
 Pt presents with complaints of urinary frequency and burning with urination since last night 5/21. OTC AZO tablet taken this morning at 0700, helped with the burning. Pt currently rates her overall pain a 5/10, pain increases with urination.

## 2023-05-26 LAB — URINE CULTURE: Culture: <10000 — AB

## 2023-05-28 ENCOUNTER — Ambulatory Visit (HOSPITAL_COMMUNITY): Payer: Self-pay

## 2023-06-02 DIAGNOSIS — I10 Essential (primary) hypertension: Secondary | ICD-10-CM | POA: Diagnosis not present

## 2023-06-02 DIAGNOSIS — E785 Hyperlipidemia, unspecified: Secondary | ICD-10-CM | POA: Diagnosis not present

## 2023-06-05 ENCOUNTER — Telehealth (INDEPENDENT_AMBULATORY_CARE_PROVIDER_SITE_OTHER): Payer: Self-pay

## 2023-06-05 NOTE — Telephone Encounter (Signed)
 Patient left a voicemail stating that she has been having leg cramps that are very severe most of the time. It has been going on for about 2 months. Called patient and left voicemail to call back for more details.

## 2023-06-10 ENCOUNTER — Emergency Department

## 2023-06-10 ENCOUNTER — Inpatient Hospital Stay

## 2023-06-10 ENCOUNTER — Inpatient Hospital Stay
Admission: EM | Admit: 2023-06-10 | Discharge: 2023-06-12 | DRG: 552 | Disposition: A | Discharge status: Home / Self Care | Attending: Internal Medicine | Admitting: Internal Medicine

## 2023-06-10 DIAGNOSIS — Z8679 Personal history of other diseases of the circulatory system: Secondary | ICD-10-CM | POA: Diagnosis not present

## 2023-06-10 DIAGNOSIS — S12110D Anterior displaced Type II dens fracture, subsequent encounter for fracture with routine healing: Secondary | ICD-10-CM | POA: Diagnosis not present

## 2023-06-10 DIAGNOSIS — K2100 Gastro-esophageal reflux disease with esophagitis, without bleeding: Secondary | ICD-10-CM | POA: Diagnosis not present

## 2023-06-10 DIAGNOSIS — F411 Generalized anxiety disorder: Secondary | ICD-10-CM | POA: Diagnosis present

## 2023-06-10 DIAGNOSIS — R0789 Other chest pain: Secondary | ICD-10-CM | POA: Diagnosis not present

## 2023-06-10 DIAGNOSIS — Z7982 Long term (current) use of aspirin: Secondary | ICD-10-CM | POA: Diagnosis not present

## 2023-06-10 DIAGNOSIS — F32A Depression, unspecified: Secondary | ICD-10-CM | POA: Diagnosis present

## 2023-06-10 DIAGNOSIS — S12112D Nondisplaced Type II dens fracture, subsequent encounter for fracture with routine healing: Secondary | ICD-10-CM | POA: Diagnosis not present

## 2023-06-10 DIAGNOSIS — I2510 Atherosclerotic heart disease of native coronary artery without angina pectoris: Secondary | ICD-10-CM | POA: Diagnosis not present

## 2023-06-10 DIAGNOSIS — S12110A Anterior displaced Type II dens fracture, initial encounter for closed fracture: Secondary | ICD-10-CM | POA: Diagnosis not present

## 2023-06-10 DIAGNOSIS — M47812 Spondylosis without myelopathy or radiculopathy, cervical region: Secondary | ICD-10-CM | POA: Diagnosis not present

## 2023-06-10 DIAGNOSIS — S0990XA Unspecified injury of head, initial encounter: Secondary | ICD-10-CM | POA: Diagnosis not present

## 2023-06-10 DIAGNOSIS — Z91040 Latex allergy status: Secondary | ICD-10-CM

## 2023-06-10 DIAGNOSIS — M2548 Effusion, other site: Secondary | ICD-10-CM | POA: Diagnosis not present

## 2023-06-10 DIAGNOSIS — Z821 Family history of blindness and visual loss: Secondary | ICD-10-CM | POA: Diagnosis not present

## 2023-06-10 DIAGNOSIS — S199XXA Unspecified injury of neck, initial encounter: Secondary | ICD-10-CM | POA: Diagnosis not present

## 2023-06-10 DIAGNOSIS — Z888 Allergy status to other drugs, medicaments and biological substances status: Secondary | ICD-10-CM

## 2023-06-10 DIAGNOSIS — Y92008 Other place in unspecified non-institutional (private) residence as the place of occurrence of the external cause: Secondary | ICD-10-CM

## 2023-06-10 DIAGNOSIS — R079 Chest pain, unspecified: Secondary | ICD-10-CM | POA: Diagnosis present

## 2023-06-10 DIAGNOSIS — I6782 Cerebral ischemia: Secondary | ICD-10-CM | POA: Diagnosis not present

## 2023-06-10 DIAGNOSIS — W108XXA Fall (on) (from) other stairs and steps, initial encounter: Secondary | ICD-10-CM | POA: Diagnosis present

## 2023-06-10 DIAGNOSIS — I10 Essential (primary) hypertension: Secondary | ICD-10-CM | POA: Diagnosis not present

## 2023-06-10 DIAGNOSIS — Z823 Family history of stroke: Secondary | ICD-10-CM | POA: Diagnosis not present

## 2023-06-10 DIAGNOSIS — R918 Other nonspecific abnormal finding of lung field: Secondary | ICD-10-CM | POA: Diagnosis not present

## 2023-06-10 DIAGNOSIS — Z885 Allergy status to narcotic agent status: Secondary | ICD-10-CM | POA: Diagnosis not present

## 2023-06-10 DIAGNOSIS — Z8249 Family history of ischemic heart disease and other diseases of the circulatory system: Secondary | ICD-10-CM | POA: Diagnosis not present

## 2023-06-10 DIAGNOSIS — S12110S Anterior displaced Type II dens fracture, sequela: Secondary | ICD-10-CM | POA: Diagnosis not present

## 2023-06-10 DIAGNOSIS — Z8261 Family history of arthritis: Secondary | ICD-10-CM | POA: Diagnosis not present

## 2023-06-10 DIAGNOSIS — S12100A Unspecified displaced fracture of second cervical vertebra, initial encounter for closed fracture: Secondary | ICD-10-CM | POA: Diagnosis not present

## 2023-06-10 DIAGNOSIS — E785 Hyperlipidemia, unspecified: Secondary | ICD-10-CM | POA: Diagnosis present

## 2023-06-10 DIAGNOSIS — S12111A Posterior displaced Type II dens fracture, initial encounter for closed fracture: Secondary | ICD-10-CM | POA: Diagnosis not present

## 2023-06-10 DIAGNOSIS — Y9301 Activity, walking, marching and hiking: Secondary | ICD-10-CM | POA: Diagnosis present

## 2023-06-10 DIAGNOSIS — K219 Gastro-esophageal reflux disease without esophagitis: Secondary | ICD-10-CM | POA: Diagnosis present

## 2023-06-10 DIAGNOSIS — Z79899 Other long term (current) drug therapy: Secondary | ICD-10-CM | POA: Diagnosis not present

## 2023-06-10 DIAGNOSIS — M4802 Spinal stenosis, cervical region: Secondary | ICD-10-CM | POA: Diagnosis not present

## 2023-06-10 DIAGNOSIS — I700 Atherosclerosis of aorta: Secondary | ICD-10-CM | POA: Diagnosis not present

## 2023-06-10 LAB — CBC WITH DIFFERENTIAL/PLATELET
Abs Immature Granulocytes: 0.05 10*3/uL (ref 0.00–0.07)
Basophils Absolute: 0.1 10*3/uL (ref 0.0–0.1)
Basophils Relative: 1 %
Eosinophils Absolute: 0.2 10*3/uL (ref 0.0–0.5)
Eosinophils Relative: 2 %
HCT: 36.6 % (ref 36.0–46.0)
Hemoglobin: 12.4 g/dL (ref 12.0–15.0)
Immature Granulocytes: 1 %
Lymphocytes Relative: 9 %
Lymphs Abs: 1 10*3/uL (ref 0.7–4.0)
MCH: 31.2 pg (ref 26.0–34.0)
MCHC: 33.9 g/dL (ref 30.0–36.0)
MCV: 92 fL (ref 80.0–100.0)
Monocytes Absolute: 0.7 10*3/uL (ref 0.1–1.0)
Monocytes Relative: 7 %
Neutro Abs: 9 10*3/uL — ABNORMAL HIGH (ref 1.7–7.7)
Neutrophils Relative %: 80 %
Platelets: 252 10*3/uL (ref 150–400)
RBC: 3.98 MIL/uL (ref 3.87–5.11)
RDW: 12.1 % (ref 11.5–15.5)
WBC: 11.1 10*3/uL — ABNORMAL HIGH (ref 4.0–10.5)
nRBC: 0 % (ref 0.0–0.2)

## 2023-06-10 LAB — BASIC METABOLIC PANEL WITH GFR
Anion gap: 11 (ref 5–15)
BUN: 15 mg/dL (ref 8–23)
CO2: 24 mmol/L (ref 22–32)
Calcium: 9.2 mg/dL (ref 8.9–10.3)
Chloride: 101 mmol/L (ref 98–111)
Creatinine, Ser: 0.62 mg/dL (ref 0.44–1.00)
GFR, Estimated: >60 mL/min (ref >60)
Glucose, Bld: 127 mg/dL — ABNORMAL HIGH (ref 70–99)
Potassium: 3.7 mmol/L (ref 3.5–5.1)
Sodium: 136 mmol/L (ref 135–145)

## 2023-06-10 LAB — PROTIME-INR
INR: 1.1 (ref 0.8–1.2)
Prothrombin Time: 14 s (ref 11.4–15.2)

## 2023-06-10 LAB — TROPONIN I (HIGH SENSITIVITY)
Troponin I (High Sensitivity): 4 ng/L (ref <18)
Troponin I (High Sensitivity): 4 ng/L (ref <18)

## 2023-06-10 MED ORDER — MORPHINE SULFATE (PF) 2 MG/ML IV SOLN
2.0000 mg | Freq: Once | INTRAVENOUS | Status: AC
  Administered 2023-06-10: 2 mg via INTRAVENOUS
  Filled 2023-06-10: qty 1

## 2023-06-10 MED ORDER — ONDANSETRON HCL 4 MG/2ML IJ SOLN
4.0000 mg | Freq: Once | INTRAMUSCULAR | Status: AC
  Administered 2023-06-10: 4 mg via INTRAVENOUS
  Filled 2023-06-10: qty 2

## 2023-06-10 MED ORDER — AMLODIPINE BESYLATE 10 MG PO TABS
10.0000 mg | ORAL_TABLET | Freq: Every day | ORAL | Status: DC
  Administered 2023-06-11 – 2023-06-12 (×2): 10 mg via ORAL
  Filled 2023-06-10 (×3): qty 1

## 2023-06-10 MED ORDER — ONDANSETRON HCL 4 MG PO TABS
4.0000 mg | ORAL_TABLET | Freq: Four times a day (QID) | ORAL | Status: DC | PRN
  Administered 2023-06-12: 4 mg via ORAL
  Filled 2023-06-10: qty 1

## 2023-06-10 MED ORDER — ALBUTEROL SULFATE (2.5 MG/3ML) 0.083% IN NEBU: 2.5000 mg | INHALATION_SOLUTION | RESPIRATORY_TRACT | Status: DC | PRN

## 2023-06-10 MED ORDER — ACETAMINOPHEN 650 MG RE SUPP: 650.0000 mg | Freq: Four times a day (QID) | RECTAL | Status: DC | PRN

## 2023-06-10 MED ORDER — ACETAMINOPHEN 500 MG PO TABS
1000.0000 mg | ORAL_TABLET | Freq: Once | ORAL | Status: AC
  Administered 2023-06-10: 1000 mg via ORAL
  Filled 2023-06-10: qty 2

## 2023-06-10 MED ORDER — OXYCODONE HCL 5 MG PO TABS
5.0000 mg | ORAL_TABLET | ORAL | Status: DC | PRN
  Administered 2023-06-10 – 2023-06-11 (×3): 5 mg via ORAL
  Filled 2023-06-10 (×4): qty 1

## 2023-06-10 MED ORDER — ALUM & MAG HYDROXIDE-SIMETH 200-200-20 MG/5ML PO SUSP
30.0000 mL | Freq: Once | ORAL | Status: AC
  Administered 2023-06-10: 30 mL via ORAL
  Filled 2023-06-10: qty 30

## 2023-06-10 MED ORDER — POLYETHYLENE GLYCOL 3350 17 G PO PACK
17.0000 g | PACK | Freq: Every day | ORAL | Status: DC | PRN
Start: 2023-06-10 — End: 2023-06-12
  Filled 2023-06-10: qty 1

## 2023-06-10 MED ORDER — CARVEDILOL 25 MG PO TABS
25.0000 mg | ORAL_TABLET | Freq: Two times a day (BID) | ORAL | Status: DC
  Administered 2023-06-10 – 2023-06-12 (×4): 25 mg via ORAL
  Filled 2023-06-10 (×5): qty 1

## 2023-06-10 MED ORDER — TRAZODONE HCL 50 MG PO TABS
50.0000 mg | ORAL_TABLET | Freq: Every day | ORAL | Status: DC
  Administered 2023-06-10 – 2023-06-11 (×2): 50 mg via ORAL
  Filled 2023-06-10 (×2): qty 1

## 2023-06-10 MED ORDER — MORPHINE SULFATE (PF) 2 MG/ML IV SOLN
2.0000 mg | INTRAVENOUS | Status: DC | PRN
  Administered 2023-06-10 – 2023-06-12 (×6): 2 mg via INTRAVENOUS
  Filled 2023-06-10 (×6): qty 1

## 2023-06-10 MED ORDER — LIDOCAINE VISCOUS HCL 2 % MT SOLN
15.0000 mL | Freq: Once | OROMUCOSAL | Status: AC
  Administered 2023-06-10: 15 mL via ORAL
  Filled 2023-06-10: qty 15

## 2023-06-10 MED ORDER — PANTOPRAZOLE SODIUM 40 MG PO TBEC
40.0000 mg | DELAYED_RELEASE_TABLET | Freq: Every day | ORAL | Status: DC
  Administered 2023-06-11 – 2023-06-12 (×2): 40 mg via ORAL
  Filled 2023-06-10 (×3): qty 1

## 2023-06-10 MED ORDER — ROSUVASTATIN CALCIUM 10 MG PO TABS
10.0000 mg | ORAL_TABLET | Freq: Every day | ORAL | Status: DC
  Administered 2023-06-10 – 2023-06-11 (×2): 10 mg via ORAL
  Filled 2023-06-10 (×2): qty 1

## 2023-06-10 MED ORDER — ACETAMINOPHEN 325 MG PO TABS
650.0000 mg | ORAL_TABLET | Freq: Four times a day (QID) | ORAL | Status: DC | PRN
  Administered 2023-06-10 – 2023-06-12 (×3): 650 mg via ORAL
  Filled 2023-06-10 (×3): qty 2

## 2023-06-10 MED ORDER — LORATADINE 10 MG PO TABS
10.0000 mg | ORAL_TABLET | Freq: Every day | ORAL | Status: DC
  Administered 2023-06-10 – 2023-06-11 (×2): 10 mg via ORAL
  Filled 2023-06-10 (×3): qty 1

## 2023-06-10 MED ORDER — ONDANSETRON HCL 4 MG/2ML IJ SOLN
4.0000 mg | Freq: Four times a day (QID) | INTRAMUSCULAR | Status: DC | PRN
  Administered 2023-06-11 (×2): 4 mg via INTRAVENOUS
  Filled 2023-06-10 (×2): qty 2

## 2023-06-10 MED ORDER — VENLAFAXINE HCL ER 75 MG PO CP24
75.0000 mg | ORAL_CAPSULE | Freq: Every day | ORAL | Status: DC
  Administered 2023-06-11 – 2023-06-12 (×2): 75 mg via ORAL
  Filled 2023-06-10 (×2): qty 1

## 2023-06-10 MED ORDER — IOHEXOL 350 MG/ML SOLN
75.0000 mL | Freq: Once | INTRAVENOUS | Status: AC | PRN
  Administered 2023-06-10: 75 mL via INTRAVENOUS

## 2023-06-10 MED ORDER — FAMOTIDINE 20 MG PO TABS
20.0000 mg | ORAL_TABLET | Freq: Once | ORAL | Status: AC
  Administered 2023-06-10: 20 mg via ORAL
  Filled 2023-06-10: qty 1

## 2023-06-10 NOTE — Assessment & Plan Note (Signed)
 Mildly displaced and unstable as per neurosurgery. They will evaluate more imagery before making plans for surgery.

## 2023-06-10 NOTE — ED Notes (Signed)
 C-Collar applied to the pt.

## 2023-06-10 NOTE — ED Triage Notes (Signed)
 Pt states that overnight she was going down the steps in her townhome and tripped, causing her to fall down the steps from about 3/4 of the way up. Pt c/o neck pain and bilateral shoulder pain. Not on blood thinners. No LOC per pt.

## 2023-06-10 NOTE — Assessment & Plan Note (Signed)
 Continue amlodipine and carvedilol

## 2023-06-10 NOTE — H&P (Signed)
 History and Physical    Patient: Amber Brooks UJW:[id number] DOB: [birth year] DOA: 06/10/2023 DOS: the patient was seen and examined on 06/10/2023 PCP: Bluford Burkitt, NP  Patient coming from: Home  Chief Complaint:  Chief Complaint  Patient presents with   Fall   Neck Injury   HPI: Amber Brooks is a 78 y.o. female with medical history significant of hypertension, hyperlipidemia, history of clip and stent of cerebral aneurysm. The patient states that she had a mechanical fall down stairs yesterday evening. Afterwards the patient went to bed, but had discomfort in her neck and a headache. It got worse overnight and the patient came to the ED.  In the ED she was found to have a mildly displaced type II ondontoid fracture which is considered to be unstable. She has been placed in a hard collar. Neurosurgery is evaluating further imaging before making plans for surgery.   The patient states that she was in her usual state of health at the time of the fall. She denies any fevers, chills, nausea, vomiting, diarrhea, neurological changes, or chest pain. Afterwards she states that she has only had pain in her neck and shoulders. She denies any neurological changes in her upper extremities, or any changes in her respiratory status.  Review of Systems: As mentioned in the history of present illness. All other systems reviewed and are negative. Past Medical History:  Diagnosis Date   Allergy 1968   Latex   Anxiety    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   Brain aneurysm    Cataract 2022   Complication of anesthesia    Depression    GERD (gastroesophageal reflux disease)    Headache(784.0)    migraines   History of chicken pox    Hyperlipidemia    Hypertension    Mitral valve prolapse    PONV (postoperative nausea and vomiting)    Shingles    Past Surgical History:  Procedure Laterality Date   ABDOMINAL HYSTERECTOMY  1987   ANEURYSM COILING     APPENDECTOMY  1967   BRAIN SURGERY  2014    CHOLECYSTECTOMY  2018   COLONOSCOPY N/A 09/27/2020   Procedure: COLONOSCOPY;  Surgeon: Quintin Buckle, DO;  Location: [facility] ENDOSCOPY;  Service: Gastroenterology;  Laterality: N/A;   CRANIOTOMY Left 11/15/2012   Procedure: CRANIOTOMY INTRACRANIAL ANEURYSM FOR CAROTID;  Surgeon: Pasty Bongo, MD;  Location: [facility] NEURO ORS;  Service: Neurosurgery;  Laterality: Left;  LEFT Craniotomy for Aneurysm   ECTOPIC PREGNANCY SURGERY     ESOPHAGOGASTRODUODENOSCOPY N/A 09/27/2020   Procedure: ESOPHAGOGASTRODUODENOSCOPY (EGD);  Surgeon: Quintin Buckle, DO;  Location: [facility] ENDOSCOPY;  Service: Gastroenterology;  Laterality: N/A;   KNEE ARTHROSCOPY     LUMBAR LAMINECTOMY/DECOMPRESSION MICRODISCECTOMY N/A 04/27/2014   Procedure: LUMBAR LAMINECTOMY/DECOMPRESSION MICRODISCECTOMY LUMBAR FOUR-FIVE ;  Surgeon: Elna Haggis, MD;  Location: [facility] NEURO ORS;  Service: Neurosurgery;  Laterality: N/A;   RADIOLOGY WITH ANESTHESIA N/A 10/09/2012   Procedure: RADIOLOGY WITH ANESTHESIA;  Surgeon: Bronson Canny, MD;  Location: [facility] OR;  Service: Radiology;  Laterality: N/A;   RENAL ANGIOGRAPHY N/A 03/09/2020   Procedure: RENAL ANGIOGRAPHY;  Surgeon: Jackquelyn Mass, MD;  Location: [facility] INVASIVE CV LAB;  Service: Cardiovascular;  Laterality: N/A;   TONSILLECTOMY     VASCULAR SURGERY     stent placed   Social History:  reports that she has never smoked. She has never used smokeless tobacco. She reports current alcohol use of about 1.0 standard drink of alcohol per week. She reports that  she does not use drugs.  Allergies  Allergen Reactions   Hydromorphone  Itching   Latex Anaphylaxis   Atorvastatin Other (See Comments)    Aches and hair loss   Clonidine  Cough   Clonidine  Derivatives Cough   Lisinopril Cough   Silenor [Doxepin] Other (See Comments)    jittery    Family History  Problem Relation Age of Onset   Vision loss Mother    Stroke Father    Heart disease Father    Dementia Father     Arthritis Father    Hypertension Father    Breast cancer Cousin    Dementia Brother    Depression Maternal Grandfather     Prior to Admission medications   Medication Sig Start Date End Date Taking? Authorizing Provider  amLODipine  (NORVASC ) 10 MG tablet TAKE 1 TABLET BY MOUTH DAILY 01/16/23   Bluford Burkitt, NP  amoxicillin -clavulanate (AUGMENTIN ) 875-125 MG tablet Take 1 tablet by mouth 2 (two) times daily. 03/16/23   Tona Francis, NP  aspirin  EC 81 MG tablet Take 1 tablet (81 mg total) by mouth daily. Swallow whole. 11/17/22   Dunn, Elvia Hammans, PA-C  benzonatate  (TESSALON  PERLES) 100 MG capsule Take 1 capsule (100 mg total) by mouth 3 (three) times daily as needed. 03/16/23   Kaur, Charanpreet, NP  carvedilol  (COREG ) 25 MG tablet TAKE 1 TABLET BY MOUTH TWICE  DAILY 01/16/23   Bluford Burkitt, NP  EPINEPHrine  0.3 mg/0.3 mL IJ SOAJ injection Inject into the muscle once as needed. 07/21/21   [provider]  esomeprazole  (NEXIUM ) 40 MG capsule Take 1 capsule (40 mg total) by mouth 2 (two) times daily before a meal. 04/13/23   Bluford Burkitt, NP  fluticasone  (FLONASE ) 50 MCG/ACT nasal spray Place 2 sprays into both nostrils daily. 12/08/22   Bluford Burkitt, NP  ibuprofen  (ADVIL ) 800 MG tablet TAKE 1 TABLET BY MOUTH EVERY 8  HOURS AS NEEDED FOR HEADACHE 04/13/23   Bluford Burkitt, NP  loratadine  (CLARITIN ) 10 MG tablet TAKE 1 TABLET BY MOUTH EVERY DAY 04/09/23   Kaur, Charanpreet, NP  Multiple Vitamin (MULTIVITAMIN WITH MINERALS) TABS tablet Take 1 tablet by mouth daily.    [provider]  Omega 3 1000 MG CAPS Take 1,000 mg by mouth daily.    [provider]  phenazopyridine  (PYRIDIUM ) 100 MG tablet Take 1 tablet (100 mg total) by mouth 3 (three) times daily as needed for pain. 05/24/23   Mecum, Erin E, PA-C  rosuvastatin  (CRESTOR ) 10 MG tablet TAKE 1 TABLET BY MOUTH EVERY  NIGHT AT BEDTIME 01/16/23   Bluford Burkitt, NP  traZODone  (DESYREL ) 50 MG tablet Take 1 tablet (50 mg total) by mouth at  bedtime. 04/13/23   Bluford Burkitt, NP  venlafaxine  XR (EFFEXOR  XR) 75 MG 24 hr capsule Take 1 capsule (75 mg total) by mouth daily with breakfast. 04/13/23   Bluford Burkitt, NP  diphenhydrAMINE  (BENADRYL ) 25 MG tablet Take 25 mg by mouth daily as needed for allergies.  07/15/19  [provider]  ezetimibe -simvastatin  (VYTORIN ) 10-40 MG per tablet Take 1 tablet by mouth at bedtime.   07/15/19  [provider]    Physical Exam: Vitals:   06/10/23 0913 06/10/23 0944 06/10/23 1305  BP: (!) 185/71  (!) 158/72  Pulse: 80  71  Resp: 16  16  Temp: 97.9 F (36.6 C)  97.9 F (36.6 C)  TempSrc: Oral  Oral  SpO2: 98% 98% 99%   Exam:  Constitutional:  The patient is awake,  alert, and oriented x 3. Mild distress due to pain in her neck. Eyes:  pupils and irises appear normal Normal lids and conjunctivae ENMT:  grossly normal hearing  Lips appear normal external ears, nose appear normal Oropharynx: mucosa, tongue,posterior pharynx appear normal Neck:  neck appears normal, no masses, normal ROM, supple no thyromegaly Respiratory:  No increased work of breathing. No wheezes, rales, or rhonchi No tactile fremitus Cardiovascular:  Regular rate and rhythm No murmurs, ectopy, or gallups. No lateral PMI. No thrills. Abdomen:  Abdomen is soft, non-tender, non-distended No hernias, masses, or organomegaly Normoactive bowel sounds.  Musculoskeletal:  No cyanosis, clubbing, or edema Hard collar in place Skin:  No rashes, lesions, ulcers palpation of skin: no induration or nodules Neurologic:  CN 2-12 intact Sensation all 4 extremities intact Psychiatric:  Mental status Mood, affect appropriate Orientation to person, place, time  judgment and insight appear intact  Data Reviewed:  CBC, BMP CT C-spine CT head  Assessment and Plan: Chest pain Shortly after I saw the patient I was advised by nursing that the she had developed substernal chest pain. EKG had been  ordered by the EDP, and had non-specific ST -segment changes.  Stat troponin was ordered as well as a stat CTA chest. The CTA chest was ordered as I received a second message stating that the patients pain had worsened. Morphine  was also ordered. I am awaiting results of these studies. I went to evaluate that patient, but I believe she had been taken to undergo MRI as ordered by neurosurgery.  Gastroesophageal reflux disease with esophagitis Noted. Continue PPI as outpatient.  Generalized anxiety disorder Continue venlafaxine  XR as at home. Low dose anxiolytic will be made available.  Hypertension Continue amlodipine  and carvedilol .  Odontoid fracture with type II morphology (HCC) Mildly displaced and unstable as per neurosurgery. They will evaluate more imagery before making plans for surgery.   Advance Care Planning:   Code Status: Full Code   Consults: Neurosurgery  Family Communication: None available  Severity of Illness: The appropriate patient status for this patient is INPATIENT. Inpatient status is judged to be reasonable and necessary in order to provide the required intensity of service to ensure the patient's safety. The patient's presenting symptoms, physical exam findings, and initial radiographic and laboratory data in the context of their chronic comorbidities is felt to place them at high risk for further clinical deterioration. Furthermore, it is not anticipated that the patient will be medically stable for discharge from the hospital within 2 midnights of admission.   * I certify that at the point of admission it is my clinical judgment that the patient will require inpatient hospital care spanning beyond 2 midnights from the point of admission due to high intensity of service, high risk for further deterioration and high frequency of surveillance required.*  Author: Davisha Linthicum, DO 06/10/2023 2:09 PM  For on call review [email or web address].

## 2023-06-10 NOTE — ED Provider Notes (Signed)
 [facility] Provider Note    Event Date/Time   First MD Initiated Contact with Patient 06/10/23 [phone]     (approximate)   History   Fall and Neck Injury  Pt states that overnight she was going down the steps in her townhome and tripped, causing her to fall down the steps from about 3/4 of the way up. Pt c/o neck pain and bilateral shoulder pain. Not on blood thinners. No LOC per pt.    HPI Amber Brooks is a 78 y.o. female PMH cerebral aneurysm status post stent/clip, hypertension, hyperlipidemia, migraines presents for evaluation after fall - Patient was walking down her stairs to get a glass of water yesterday evening around 10:30 PM, tripped and fell down about two thirds flights of stairs.  No LOC.  Not on blood thinners.  Ambulatory after event.  Has had progressively worsening neck pain and headache since that time.  Some nausea but no vomiting. - pain 10/10, last p.o. intake around 8 AM.  No pain medications today.  Refractory to Motrin  last night.     Physical Exam   Triage Vital Signs: ED Triage Vitals  Encounter Vitals Group     BP 06/10/23 0913 (!) 185/71     Systolic BP Percentile --      Diastolic BP Percentile --      Pulse Rate 06/10/23 0913 80     Resp 06/10/23 0913 16     Temp 06/10/23 0913 97.9 F (36.6 C)     Temp Source 06/10/23 0913 Oral     SpO2 06/10/23 0913 98 %     Weight --      Height --      Head Circumference --      Peak Flow --      Pain Score 06/10/23 0912 10     Pain Loc --      Pain Education --      Exclude from Growth Chart --     Most recent vital signs: Vitals:   06/10/23 0913 06/10/23 0944  BP: (!) 185/71   Pulse: 80   Resp: 16   Temp: 97.9 F (36.6 C)   SpO2: 98% 98%     General: Awake, no distress.  HEENT: Mild right periorbital bruising, otherwise no external evidence of trauma Neck: + Midline C-spine pain, no obvious step-off CV:  Good peripheral perfusion. RRR, RP 2+ Resp:  Normal  effort. CTAB Back:  No midline pain Abd:  No distention. Nontender to deep palpation throughout Other:  Full range of motion of all joints, no tenderness to palpation throughout extremities   ED Results / Procedures / Treatments   Labs (all labs ordered are listed, but only abnormal results are displayed) Labs Reviewed  CBC WITH DIFFERENTIAL/PLATELET - Abnormal; Notable for the following components:      Result Value   WBC 11.1 (*)    Neutro Abs 9.0 (*)    All other components within normal limits  BASIC METABOLIC PANEL WITH GFR - Abnormal; Notable for the following components:   Glucose, Bld 127 (*)    All other components within normal limits  PROTIME-INR  TROPONIN I (HIGH SENSITIVITY)     EKG  N/a   RADIOLOGY Etiology interpreted by myself and radiology reports reviewed.  Notable for odontoid type II fracture.    PROCEDURES:  Critical Care performed: No  Procedures   MEDICATIONS ORDERED IN ED: Medications  acetaminophen  (TYLENOL ) tablet 650 mg (has no  administration in time range)    Or  acetaminophen  (TYLENOL ) suppository 650 mg (has no administration in time range)  oxyCODONE  (Oxy IR/ROXICODONE ) immediate release tablet 5 mg (has no administration in time range)  morphine  (PF) 2 MG/ML injection 2 mg (has no administration in time range)  polyethylene glycol (MIRALAX  / GLYCOLAX ) packet 17 g (has no administration in time range)  ondansetron  (ZOFRAN ) tablet 4 mg (has no administration in time range)    Or  ondansetron  (ZOFRAN ) injection 4 mg (has no administration in time range)  alum & mag hydroxide-simeth (MAALOX/MYLANTA) 200-200-20 MG/5ML suspension 30 mL (has no administration in time range)    And  lidocaine  (XYLOCAINE ) 2 % viscous mouth solution 15 mL (has no administration in time range)  famotidine  (PEPCID ) tablet 20 mg (has no administration in time range)  morphine  (PF) 2 MG/ML injection 2 mg (2 mg Intravenous Given 06/10/23 1009)  acetaminophen   (TYLENOL ) tablet 1,000 mg (1,000 mg Oral Given 06/10/23 1011)  ondansetron  (ZOFRAN ) injection 4 mg (4 mg Intravenous Given 06/10/23 1007)  morphine  (PF) 2 MG/ML injection 2 mg (2 mg Intravenous Given 06/10/23 1200)     IMPRESSION / MDM / ASSESSMENT AND PLAN / ED COURSE  I reviewed the triage vital signs and the nursing notes.                              DDX/MDM/AP: Differential diagnosis includes, but is not limited to, C-spine fracture, consider intracranial hemorrhage or skull fracture.  Fortunately no evidence of other traumatic injuries at this time.  Patient gives a story of mechanical fall.  Given severe pain, will place IV for IV pain control, basic labs.  Plan: -C-collar -CT head, CT C-spine - Labs - Pain control, antiemetic - Reassess  Patient's presentation is most consistent with acute presentation with potential threat to life or bodily function.    ED course below.  Workup notable for type II odontoid fracture, fortunately no neurologic deficits appreciated here and patient has been maintained in a c-collar.  Neurosurgery consulted, request MRI as well as 2 view x-rays.  Patient admitted to hospital service for pain control, neurosurgery will continue to follow to determine appropriate management plan.  As patient was being moved to the floor, she did start complaining of some chest discomfort.  EKG nonischemic on my read.  Appears to be right chest and epigastric region and states that it feels somewhat like GERD.  GI cocktail ordered and hospitalist notified, defer to inpatient team on further management plan.  Clinical Course as of 06/10/23 1241  Sun Jun 10, 2023  1030 CTH, CT Cspine: IMPRESSION: 1. No acute intracranial process. Small vessel ischemic changes. 2. Type 2 odontoid fracture.   [MM]  1030 Paging NSGY [MM]  1112 Dr. Felipe Horton of neurosurgery evaluated images, recommends hard C-spine collar (already in place) 2 view x-ray of neck, MRI Noncon.  Reasonable to  admit for pain control.  No emergent surgical intervention at this time but will continue to follow. [MM]    Clinical Course User Index [MM] Collis Deaner, MD     FINAL CLINICAL IMPRESSION(S) / ED DIAGNOSES   Final diagnoses:  Closed odontoid fracture, initial encounter [facility])     Rx / DC Orders   ED Discharge Orders     None        Note:  This document was prepared using Dragon voice recognition software and may include unintentional dictation  errors.   Collis Deaner, MD 06/10/23 [phone]

## 2023-06-10 NOTE — Assessment & Plan Note (Signed)
 Continue venlafaxine  XR as at home. Low dose anxiolytic will be made available.

## 2023-06-10 NOTE — ED Notes (Signed)
 The pt advised she is having mid sternal chest pain. 12 lead was obtained and seen by ED physician. Attending physician notified.

## 2023-06-10 NOTE — ED Notes (Signed)
 Advised nurse that patient has ready bed

## 2023-06-10 NOTE — Progress Notes (Signed)
 Initial review demonstrates a type 2 odontoid fracture with 3 mm of displacement. Given morphology it represents an unstable fracture.  Plan for hard collar application, MRI cervical for ligament evaluation, and upright X-ray in collar to evaluate alignment while weight bearing   Will follow with full formal consultation after imaging obtained

## 2023-06-10 NOTE — Assessment & Plan Note (Signed)
 Noted. Continue PPI as outpatient.

## 2023-06-10 NOTE — Assessment & Plan Note (Signed)
 Shortly after I saw the patient I was advised by nursing that the she had developed substernal chest pain. EKG had been ordered by the EDP, and had non-specific ST -segment changes.  Stat troponin was ordered as well as a stat CTA chest. The CTA chest was ordered as I received a second message stating that the patients pain had worsened. Morphine  was also ordered. I am awaiting results of these studies. I went to evaluate that patient, but I believe she had been taken to undergo MRI as ordered by neurosurgery.

## 2023-06-11 ENCOUNTER — Other Ambulatory Visit: Payer: Self-pay

## 2023-06-11 DIAGNOSIS — S12110S Anterior displaced Type II dens fracture, sequela: Secondary | ICD-10-CM | POA: Diagnosis not present

## 2023-06-11 DIAGNOSIS — I10 Essential (primary) hypertension: Secondary | ICD-10-CM

## 2023-06-11 DIAGNOSIS — F411 Generalized anxiety disorder: Secondary | ICD-10-CM

## 2023-06-11 DIAGNOSIS — S12111A Posterior displaced Type II dens fracture, initial encounter for closed fracture: Secondary | ICD-10-CM

## 2023-06-11 DIAGNOSIS — K2100 Gastro-esophageal reflux disease with esophagitis, without bleeding: Secondary | ICD-10-CM

## 2023-06-11 DIAGNOSIS — W108XXA Fall (on) (from) other stairs and steps, initial encounter: Secondary | ICD-10-CM

## 2023-06-11 DIAGNOSIS — S12100A Unspecified displaced fracture of second cervical vertebra, initial encounter for closed fracture: Secondary | ICD-10-CM

## 2023-06-11 DIAGNOSIS — R079 Chest pain, unspecified: Secondary | ICD-10-CM | POA: Diagnosis not present

## 2023-06-11 LAB — BASIC METABOLIC PANEL WITH GFR
Anion gap: 7 (ref 5–15)
BUN: 12 mg/dL (ref 8–23)
CO2: 29 mmol/L (ref 22–32)
Calcium: 8.7 mg/dL — ABNORMAL LOW (ref 8.9–10.3)
Chloride: 101 mmol/L (ref 98–111)
Creatinine, Ser: 0.59 mg/dL (ref 0.44–1.00)
GFR, Estimated: >60 mL/min (ref >60)
Glucose, Bld: 120 mg/dL — ABNORMAL HIGH (ref 70–99)
Potassium: 3.1 mmol/L — ABNORMAL LOW (ref 3.5–5.1)
Sodium: 137 mmol/L (ref 135–145)

## 2023-06-11 LAB — PROTIME-INR
INR: 1.1 (ref 0.8–1.2)
Prothrombin Time: 14.7 s (ref 11.4–15.2)

## 2023-06-11 LAB — CBC
HCT: 34.9 % — ABNORMAL LOW (ref 36.0–46.0)
Hemoglobin: 11.5 g/dL — ABNORMAL LOW (ref 12.0–15.0)
MCH: 31 pg (ref 26.0–34.0)
MCHC: 33 g/dL (ref 30.0–36.0)
MCV: 94.1 fL (ref 80.0–100.0)
Platelets: 206 10*3/uL (ref 150–400)
RBC: 3.71 MIL/uL — ABNORMAL LOW (ref 3.87–5.11)
RDW: 12 % (ref 11.5–15.5)
WBC: 6 10*3/uL (ref 4.0–10.5)
nRBC: 0 % (ref 0.0–0.2)

## 2023-06-11 MED ORDER — POTASSIUM CHLORIDE CRYS ER 20 MEQ PO TBCR
40.0000 meq | EXTENDED_RELEASE_TABLET | Freq: Once | ORAL | Status: AC
  Administered 2023-06-11: 40 meq via ORAL
  Filled 2023-06-11: qty 2

## 2023-06-11 MED ORDER — ALUM & MAG HYDROXIDE-SIMETH 200-200-20 MG/5ML PO SUSP: 30.0000 mL | ORAL | Status: DC | PRN

## 2023-06-11 NOTE — TOC Initial Note (Signed)
 Transition of Care [facility]) - Initial/Assessment Note    Patient Details  Name: Amber Brooks MRN: [id number] Date of Birth: 12-Jun-1945  [facility] CM/SW Contact:    Alexandra Ice, RN Phone Number: 06/11/2023, 3:18 PM  Clinical Narrative:                 Patient is pending treatment options with neurosurgery.  Patient is lives alone, independent with ADLs. She has PCP in the community. No needs identified at this time by [facility], will continue to monitor.        Patient Goals and CMS Choice            Expected Discharge Plan and Services                                              Prior Living Arrangements/Services                       Activities of Daily Living   ADL Screening (condition at time of admission) Independently performs ADLs?: Yes (appropriate for developmental age) Is the patient deaf or have difficulty hearing?: No Does the patient have difficulty seeing, even when wearing glasses/contacts?: No Does the patient have difficulty concentrating, remembering, or making decisions?: No  Permission Sought/Granted                  Emotional Assessment              Admission diagnosis:  Odontoid fracture with type II morphology (HCC) [S12.110A] Closed odontoid fracture, initial encounter (HCC) [S12.100A] Patient Active Problem List   Diagnosis Date Noted   Closed fracture of odontoid process of axis (HCC) 06/11/2023   Odontoid fracture with type II morphology (HCC) 06/10/2023   Vitamin D  deficiency 04/18/2023   URI with cough and congestion 03/28/2023   Generalized anxiety disorder 11/03/2022   Moderate episode of recurrent major depressive disorder (HCC) 11/03/2022   Dysphagia 11/03/2022   Atherosclerosis of renal artery (HCC) 03/20/2021   Lumbar and sacral osteoarthritis 03/22/2020   Age-related osteoporosis without current pathological fracture 02/06/2020   Allergic rhinitis due to pollen 02/06/2020    Diastolic dysfunction 02/06/2020   History of adenomatous polyp of colon 02/06/2020   Primary insomnia 02/06/2020   Irritable bowel syndrome 02/06/2020   Menopausal and postmenopausal disorder 02/06/2020   Migraine with aura 02/06/2020   Sleep walking 02/06/2020   S/P cholecystectomy 12/18/2019   Aneurysm (HCC) 12/18/2019   Mixed hyperlipidemia 12/18/2019   Alcohol use 06/18/2016   Benzodiazepine overdose 06/15/2016   Lumbar radiculopathy 04/26/2014   Chest pain 04/06/2014   Hypertension    Mitral valve prolapse    GERD (gastroesophageal reflux disease)    Gastroesophageal reflux disease with esophagitis    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 [place] [address] Phone: [phone] Fax: [phone]  [facility] - [place], [place] - [address] [place] [address] Phone: [phone] Fax: [phone]     Social Drivers of Health (SDOH) Social History: SDOH Screenings   Food Insecurity: No Food Insecurity (06/11/2023)  Housing: Low Risk  (06/11/2023)  Transportation Needs: No Transportation Needs (06/11/2023)  Utilities: Not At Risk (06/11/2023)  Alcohol Screen: Low Risk  (03/15/2023)  Depression (PHQ2-9): Low Risk  (03/16/2023)  Financial Resource Strain: Low Risk  (03/15/2023)  Physical Activity: Insufficiently Active (03/15/2023)  Social Connections: Moderately Integrated (06/11/2023)  Stress: No Stress Concern Present (03/15/2023)  Tobacco Use: Low Risk  (05/24/2023)   SDOH Interventions:     Readmission Risk Interventions     No data to display

## 2023-06-11 NOTE — Hospital Course (Signed)
 78 y.o. female with medical history significant of hypertension, hyperlipidemia, history of clip and stent of cerebral aneurysm. The patient states that she had a mechanical fall down stairs yesterday evening. Afterwards the patient went to bed, but had discomfort in her neck and a headache. It got worse overnight and the patient came to the ED.   In the ED she was found to have a mildly displaced type II ondontoid fracture which is considered to be unstable. She has been placed in a hard collar. Neurosurgery is evaluating further imaging before making plans for surgery.    The patient states that she was in her usual state of health at the time of the fall. She denies any fevers, chills, nausea, vomiting, diarrhea, neurological changes, or chest pain. Afterwards she states that she has only had pain in her neck and shoulders. She denies any neurological changes in her upper extremities, or any changes in her respiratory status.  6/9.  Neurosurgery to come back and talk with the patient again about neurosurgical treatment versus conservative management.  Continue pain control. 6/10.  Patient decided on conservative management.  Trying tramadol for pain since other medications made her feel sick.  Tramadol did not make her sick so we will have to go home with tramadol and Tylenol .

## 2023-06-11 NOTE — Consult Note (Signed)
 Consulting Department:  Emergency department/inpatient medicine  Primary Physician:  Bluford Burkitt, NP  Chief Complaint: C2 fracture  History of Present Illness: 06/11/2023 Amber Brooks is a 78 y.o. female who presents with the chief complaint of type II C2 cervical spine fracture.  She was in her usual state of health until recently when she was walking down her stairs she tripped falling down approximately 10-13 stairs.  She was able to recover but then felt significant neck pain.  Due to the neck pain she presented to the emergency department.  She has not had any new numbness weakness or tingling.  No previous spine surgery.  No previous neck injuries.  She is not having any new bowel or bladder dysfunction.  She does feel that the collar has been helping with her pain.  The symptoms are causing a significant impact on the patient's life.   Review of Systems:  A 10 point review of systems is negative, except for the pertinent positives and negatives detailed in the HPI.  Past Medical History: Past Medical History:  Diagnosis Date   Allergy 1968   Latex   Anxiety    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   Brain aneurysm    Cataract 2022   Complication of anesthesia    Depression    GERD (gastroesophageal reflux disease)    Headache(784.0)    migraines   History of chicken pox    Hyperlipidemia    Hypertension    Mitral valve prolapse    PONV (postoperative nausea and vomiting)    Shingles     Past Surgical History: Past Surgical History:  Procedure Laterality Date   ABDOMINAL HYSTERECTOMY  1987   ANEURYSM COILING     APPENDECTOMY  1967   BRAIN SURGERY  2014   CHOLECYSTECTOMY  2018   COLONOSCOPY N/A 09/27/2020   Procedure: COLONOSCOPY;  Surgeon: Quintin Buckle, DO;  Location: [facility] ENDOSCOPY;  Service: Gastroenterology;  Laterality: N/A;   CRANIOTOMY Left 11/15/2012   Procedure: CRANIOTOMY INTRACRANIAL ANEURYSM FOR CAROTID;  Surgeon: Pasty Bongo, MD;  Location: [facility] NEURO  ORS;  Service: Neurosurgery;  Laterality: Left;  LEFT Craniotomy for Aneurysm   ECTOPIC PREGNANCY SURGERY     ESOPHAGOGASTRODUODENOSCOPY N/A 09/27/2020   Procedure: ESOPHAGOGASTRODUODENOSCOPY (EGD);  Surgeon: Quintin Buckle, DO;  Location: [facility] ENDOSCOPY;  Service: Gastroenterology;  Laterality: N/A;   KNEE ARTHROSCOPY     LUMBAR LAMINECTOMY/DECOMPRESSION MICRODISCECTOMY N/A 04/27/2014   Procedure: LUMBAR LAMINECTOMY/DECOMPRESSION MICRODISCECTOMY LUMBAR FOUR-FIVE ;  Surgeon: Elna Haggis, MD;  Location: [facility] NEURO ORS;  Service: Neurosurgery;  Laterality: N/A;   RADIOLOGY WITH ANESTHESIA N/A 10/09/2012   Procedure: RADIOLOGY WITH ANESTHESIA;  Surgeon: Bronson Canny, MD;  Location: [facility] OR;  Service: Radiology;  Laterality: N/A;   RENAL ANGIOGRAPHY N/A 03/09/2020   Procedure: RENAL ANGIOGRAPHY;  Surgeon: Jackquelyn Mass, MD;  Location: [facility] INVASIVE CV LAB;  Service: Cardiovascular;  Laterality: N/A;   TONSILLECTOMY     VASCULAR SURGERY     stent placed    Allergies: Allergies as of 06/10/2023 - Review Complete 06/10/2023  Allergen Reaction Noted   Hydromorphone  Itching 11/16/2012   Latex Anaphylaxis 03/10/2010   Atorvastatin Other (See Comments) 05/05/2022   Clonidine  Cough 05/05/2022   Clonidine  derivatives Cough 05/05/2022   Lisinopril Cough 02/24/2022   Silenor [doxepin] Other (See Comments) 05/05/2022    Medications:  Current Facility-Administered Medications:    acetaminophen  (TYLENOL ) tablet 650 mg, 650 mg, Oral, Q6H PRN, 650 mg at 06/10/23 1656 **OR** acetaminophen  (TYLENOL ) suppository  650 mg, 650 mg, Rectal, Q6H PRN, Swayze, Ava, DO   albuterol (PROVENTIL) (2.5 MG/3ML) 0.083% nebulizer solution 2.5 mg, 2.5 mg, Nebulization, Q2H PRN, Swayze, Ava, DO   amLODipine  (NORVASC ) tablet 10 mg, 10 mg, Oral, Daily, Swayze, Ava, DO, 10 mg at 06/11/23 0844   carvedilol  (COREG ) tablet 25 mg, 25 mg, Oral, BID, Swayze, Ava, DO, 25 mg at 06/11/23 0844   loratadine  (CLARITIN )  tablet 10 mg, 10 mg, Oral, Daily, Swayze, Ava, DO, 10 mg at 06/10/23 1657   morphine  (PF) 2 MG/ML injection 2 mg, 2 mg, Intravenous, Q2H PRN, Swayze, Ava, DO, 2 mg at 06/11/23 0843   ondansetron  (ZOFRAN ) tablet 4 mg, 4 mg, Oral, Q6H PRN **OR** ondansetron  (ZOFRAN ) injection 4 mg, 4 mg, Intravenous, Q6H PRN, Swayze, Ava, DO, 4 mg at 06/11/23 1020   oxyCODONE  (Oxy IR/ROXICODONE ) immediate release tablet 5 mg, 5 mg, Oral, Q4H PRN, Swayze, Ava, DO, 5 mg at 06/11/23 1015   pantoprazole  (PROTONIX ) EC tablet 40 mg, 40 mg, Oral, Daily, Swayze, Ava, DO, 40 mg at 06/11/23 0844   polyethylene glycol (MIRALAX  / GLYCOLAX ) packet 17 g, 17 g, Oral, Daily PRN, Swayze, Ava, DO   rosuvastatin  (CRESTOR ) tablet 10 mg, 10 mg, Oral, QHS, Swayze, Ava, DO, 10 mg at 06/10/23 2137   traZODone  (DESYREL ) tablet 50 mg, 50 mg, Oral, QHS, Swayze, Ava, DO, 50 mg at 06/10/23 2137   venlafaxine  XR (EFFEXOR -XR) 24 hr capsule 75 mg, 75 mg, Oral, Q breakfast, Swayze, Ava, DO, 75 mg at 06/11/23 0844   Social History: Social History   Tobacco Use   Smoking status: Never   Smokeless tobacco: Never  Vaping Use   Vaping status: Never Used  Substance Use Topics   Alcohol use: Yes    Alcohol/week: 1.0 standard drink of alcohol    Types: 1 Glasses of wine per week    Comment: every evening   Drug use: No    Family Medical History: Family History  Problem Relation Age of Onset   Vision loss Mother    Stroke Father    Heart disease Father    Dementia Father    Arthritis Father    Hypertension Father    Breast cancer Cousin    Dementia Brother    Depression Maternal Grandfather     Physical Examination: Vitals:   06/11/23 0750 06/11/23 0820  BP: (!) 136/54 (!) 140/56  Pulse: 70   Resp: 18   Temp: (!) 97.5 F (36.4 C) 97.6 F (36.4 C)  SpO2: 94%      General: Patient is well developed, well nourished, calm, collected, and in no apparent distress.  NEUROLOGICAL:  General: In no acute distress.   Awake,  alert, oriented to person, place, and time.  Pupils equal round and reactive to light.  Facial tone is symmetric.  Tongue protrusion is midline.   ROM of spine: Did not test given fracture, patient in cervical collar  Strength: No major deficits noted.  Antigravity in the upper and lower extremities both proximally and distally.  Some limitation secondary to pain but did not appear neurologic.  No new sensation loss noted.  Reflexes are 1+ throughout.  No pathologic reflexes.   Imaging: CT Angio Chest Pulmonary Embolism (PE) W or WO Contrast Result Date: 06/10/2023 CLINICAL DATA:  Fall down stairs, acutely worsening chest pain EXAM: CT ANGIOGRAPHY CHEST WITH CONTRAST TECHNIQUE: Multidetector CT imaging of the chest was performed using the standard protocol during bolus administration of intravenous contrast. Multiplanar CT  image reconstructions and MIPs were obtained to evaluate the vascular anatomy. RADIATION DOSE REDUCTION: This exam was performed according to the departmental dose-optimization program which includes automated exposure control, adjustment of the mA and/or kV according to patient size and/or use of iterative reconstruction technique. CONTRAST:  75mL OMNIPAQUE  IOHEXOL  350 MG/ML SOLN COMPARISON:  None Available. FINDINGS: Cardiovascular: Satisfactory opacification of the pulmonary arteries to the segmental level. No evidence of pulmonary embolism. Normal heart size. Left coronary artery calcifications. No pericardial effusion. Aortic atherosclerosis. Mediastinum/Nodes: No enlarged mediastinal, hilar, or axillary lymph nodes. Thyroid  gland, trachea, and esophagus demonstrate no significant findings. Lungs/Pleura: Diffuse bilateral bronchial wall thickening. Mild dependent bibasilar scarring or atelectasis. No pleural effusion or pneumothorax. Upper Abdomen: No acute abnormality. Musculoskeletal: No chest wall abnormality. No acute osseous findings. Review of the MIP images confirms the  above findings. IMPRESSION: 1. Negative examination for pulmonary embolism. 2. No CT evidence of acute traumatic injury to the chest. 3. Diffuse bilateral bronchial wall thickening, consistent with nonspecific infectious or inflammatory bronchitis. 4. Coronary artery disease. Aortic Atherosclerosis (ICD10-I70.0). Electronically Signed   By: Fredricka Amber M.D.   On: 06/10/2023 14:40   MR Cervical Spine Wo Contrast Result Date: 06/10/2023 CLINICAL DATA:  Status post fall with type 2 odontoid process fracture on CT. Evaluate for ligamentous injury and other pathology. EXAM: MRI CERVICAL SPINE WITHOUT CONTRAST TECHNIQUE: Multiplanar, multisequence MR imaging of the cervical spine was performed. No intravenous contrast was administered. COMPARISON:  Radiographs and CT same date. Remote cervical spine radiographs 06/21/2005. FINDINGS: Alignment: Slight degenerative anterolisthesis at the C5-6, C6-7 and C7-T1 levels. Vertebrae: Again demonstrated is an oblique fracture through the base of the odontoid process with up to 3 mm of displacement, unchanged from earlier CT. No other acute fractures are identified. There are possible old healed superior endplate compression deformities at C7, T1 and T2, without residual marrow edema. There are bilateral atlanto-occipital and atlantoaxial joint effusions, attributed to the C2 fracture. Cord: Normal in signal and caliber. No evidence of cord compression or hemorrhage. Posterior Fossa, vertebral arteries, paraspinal tissues: Visualized portions of the posterior fossa appear unremarkable.Bilateral vertebral artery flow voids. No paraspinous edema to suggest significant ligamentous injury. No evidence of paraspinal or epidural hematoma. Disc levels: C2-3: Normal interspace. C3-4: Small central disc protrusion with mild uncinate spurring and asymmetric facet hypertrophy on the left. Mild spinal stenosis without cord deformity. Mild left foraminal narrowing. C4-5: Mild disc bulging  and bilateral facet hypertrophy. Mild spinal stenosis without cord deformity. Mild foraminal narrowing bilaterally. C5-6: Mild loss of disc height with mild disc bulging, uncinate spurring and bilateral facet hypertrophy. Mild spinal stenosis without cord deformity. Mild to moderate foraminal narrowing bilaterally. C6-7: Mild disc bulging and uncinate spurring. Moderate bilateral facet hypertrophy. Mild spinal stenosis without cord deformity. No significant foraminal narrowing. C7-T1: Mild bilateral facet hypertrophy. No spinal stenosis or significant foraminal narrowing. IMPRESSION: 1. Redemonstration of mildly displaced type 2 odontoid process fracture. No other acute fractures are identified. Small atlanto-occipital and atlantoaxial joint effusions, attributed to the fracture. 2. No evidence of significant ligamentous injury or epidural hematoma. 3. 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 No cord deformity or cord signal abnormality. 4. Mild to moderate foraminal narrowing bilaterally at C5-6. Electronically Signed   By: Elmon Hagedorn M.D.   On: 06/10/2023 12:15   DG Cervical Spine 2-3 View Clearing Result Date: 06/10/2023 CLINICAL DATA:  Follow-up odontoid fracture. Patient upper right in a cervical collar. EXAM: LIMITED CERVICAL SPINE  FOR TRAUMA CLEARING - 2-3 VIEW COMPARISON:  CT scan, same date. FINDINGS: Stable type 2 odontoid fracture. No change in position or alignment since the CT scan. No other significant bony findings. IMPRESSION: Stable type 2 odontoid fracture. Electronically Signed   By: Marrian Siva M.D.   On: 06/10/2023 11:35   CT HEAD WO CONTRAST ( ) Addendum Date: 06/10/2023 ADDENDUM REPORT: 06/10/2023 10:29 ADDENDUM: Communicated results with Dr. Cam Cava. Electronically Signed   By: Sydell Eva M.D.   On: 06/10/2023 10:29   Result Date: 06/10/2023 CLINICAL DATA:  Trauma. EXAM: CT HEAD WITHOUT CONTRAST CT CERVICAL SPINE WITHOUT CONTRAST  TECHNIQUE: Multidetector CT imaging of the head and cervical spine was performed following the standard protocol without intravenous contrast. Multiplanar CT image reconstructions of the cervical spine were also generated. RADIATION DOSE REDUCTION: This exam was performed according to the departmental dose-optimization program which includes automated exposure control, adjustment of the mA and/or kV according to patient size and/or use of iterative reconstruction technique. COMPARISON:  None Available. FINDINGS: CT HEAD FINDINGS Brain: There is periventricular white matter decreased attenuation consistent with small vessel ischemic changes. Gray-white differentiation is preserved. No acute intracranial hemorrhage, mass effect or shift. No hydrocephalus. Vascular: No hyperdense vessel or unexpected calcification. Skull: Normal. Negative for fracture or focal lesion. Postop changes left temporal craniotomy. Sinuses/Orbits: No acute finding. CT CERVICAL SPINE FINDINGS Alignment: Normal. Skull base and vertebrae: Type 2 odontoid fracture noted displaced by about 3 mm. No significant displacement of fragments. Soft tissues and spinal canal: No prevertebral fluid or swelling. No visible canal hematoma. Disc levels: Maintained. Upper chest: Negative. IMPRESSION: 1. No acute intracranial process. Small vessel ischemic changes. 2. Type 2 odontoid fracture. Electronically Signed: By: Sydell Eva M.D. On: 06/10/2023 10:15   CT Cervical Spine Wo Contrast Addendum Date: 06/10/2023 ADDENDUM REPORT: 06/10/2023 10:29 ADDENDUM: Communicated results with Dr. Cam Cava. Electronically Signed   By: Sydell Eva M.D.   On: 06/10/2023 10:29   Result Date: 06/10/2023 CLINICAL DATA:  Trauma. EXAM: CT HEAD WITHOUT CONTRAST CT CERVICAL SPINE WITHOUT CONTRAST TECHNIQUE: Multidetector CT imaging of the head and cervical spine was performed following the standard protocol without intravenous contrast. Multiplanar CT image reconstructions  of the cervical spine were also generated. RADIATION DOSE REDUCTION: This exam was performed according to the departmental dose-optimization program which includes automated exposure control, adjustment of the mA and/or kV according to patient size and/or use of iterative reconstruction technique. COMPARISON:  None Available. FINDINGS: CT HEAD FINDINGS Brain: There is periventricular white matter decreased attenuation consistent with small vessel ischemic changes. Gray-white differentiation is preserved. No acute intracranial hemorrhage, mass effect or shift. No hydrocephalus. Vascular: No hyperdense vessel or unexpected calcification. Skull: Normal. Negative for fracture or focal lesion. Postop changes left temporal craniotomy. Sinuses/Orbits: No acute finding. CT CERVICAL SPINE FINDINGS Alignment: Normal. Skull base and vertebrae: Type 2 odontoid fracture noted displaced by about 3 mm. No significant displacement of fragments. Soft tissues and spinal canal: No prevertebral fluid or swelling. No visible canal hematoma. Disc levels: Maintained. Upper chest: Negative. IMPRESSION: 1. No acute intracranial process. Small vessel ischemic changes. 2. Type 2 odontoid fracture. Electronically Signed: By: Sydell Eva M.D. On: 06/10/2023 10:15     I have personally reviewed the images and agree with the above interpretation.  In regards to her cervical imaging, she does show some joint effusions at the [facility] joint bilaterally, however the AP views of the cervical spine do not show significant displacement.  These likely degenerative  in nature or at the very least not representing significant instability.  In regards to the C2/dens fracture, shows some displacement approximately 3 mm with a anterior superior to posterior inferior fracture line.  MRI shows the acute nature of this without any major ligamentous injury.  Labs:    Latest Ref Rng & Units 06/11/2023    1:47 AM 06/10/2023    9:42 AM 03/16/2023    2:09 PM   CBC  WBC 4.0 - 10.5 K/uL 6.0  11.1  6.9   Hemoglobin 12.0 - 15.0 g/dL 34.7  42.5  95.6   Hematocrit 36.0 - 46.0 % 34.9  36.6  37.0   Platelets 150 - 400 K/uL 206  252  294       Latest Ref Rng & Units 06/11/2023    1:47 AM 06/10/2023    9:42 AM 03/16/2023    2:09 PM  BMP  Glucose 70 - 99 mg/dL 387  564  332   BUN 8 - 23 mg/dL 12  15  16    Creatinine 0.44 - 1.00 mg/dL 9.51  8.84  1.66   BUN/Creat Ratio 6 - 22 (calc)   SEE NOTE:   Sodium 135 - 145 mmol/L 137  136  139   Potassium 3.5 - 5.1 mmol/L 3.1  3.7  3.7   Chloride 98 - 111 mmol/L 101  101  102   CO2 22 - 32 mmol/L 29  24  26    Calcium  8.9 - 10.3 mg/dL 8.7  9.2  9.7     INR  1.1 (06/09 0147)   Assessment and Plan: Amber Brooks is a pleasant 78 y.o. female with history of a previous craniotomy for aneurysm clipping.  She was in her usual state of health until she fell down approximately 10-13 stairs.  She was able to recover herself however she noted significant neck pain afterwards.  Because of her progressive neck pain was brought to the emergency department.  Found to have a type II dens fracture with 3 mm displacement.  MRI demonstrated that this was acute without ligamentous injury.  Given the displacement as well as her pain I did discuss with her that she does meet indications for surgical fixation from C1-C2 and that she would require to wear collar afterwards to help with her recovery.  I did state however that in terms of motion loss many patients will lose upwards of 80% of their range of motion in the cervical spine with C1-2 fusions.  And that many patients choose to go forward with a trial of conservative therapy to see whether or not they can either auto fuse or develop the stable nonunion.  We discussed the risks and benefits of surgery.  We discussed risks and benefits of conservative care.  She would like me to stop back by for rounds this afternoon after she has had more time to think.  I did discuss this with the  patient's permission with both her nephew and family member.  They were helping her to think through the options.  Carroll Clamp, MD/MSCR Dept. of Neurosurgery     I had a follow-up with this patient and her family in the afternoon on rounds.  She states that she would like to attempt a conservative management option for her cervical spine fracture.  We let her know that this would mean remaining in the collar over the next 12 weeks in hopes of autofusion or stable nonunion.  Will plan on seeing her back  in clinic with upright x-rays to evaluate for any progression.  A note has been sent to our clinic to get her scheduled.

## 2023-06-11 NOTE — Progress Notes (Signed)
 Progress Note   Patient: Amber Brooks:[id number] DOB: 20-Aug-1945 DOA: 06/10/2023     1 DOS: the patient was seen and examined on 06/11/2023   Brief hospital course: 78 y.o. female with medical history significant of hypertension, hyperlipidemia, history of clip and stent of cerebral aneurysm. The patient states that she had a mechanical fall down stairs yesterday evening. Afterwards the patient went to bed, but had discomfort in her neck and a headache. It got worse overnight and the patient came to the ED.   In the ED she was found to have a mildly displaced type II ondontoid fracture which is considered to be unstable. She has been placed in a hard collar. Neurosurgery is evaluating further imaging before making plans for surgery.    The patient states that she was in her usual state of health at the time of the fall. She denies any fevers, chills, nausea, vomiting, diarrhea, neurological changes, or chest pain. Afterwards she states that she has only had pain in her neck and shoulders. She denies any neurological changes in her upper extremities, or any changes in her respiratory status.  6/9.  Neurosurgery to come back and talk with the patient again about neurosurgical treatment versus conservative management.  Continue pain control.  Assessment and Plan: * Odontoid fracture with type II morphology (HCC) Mildly displaced and unstable as per neurosurgery.  Neurosurgery will come back and talk with the patient about conservative management versus surgical intervention.  Depending on what patient chooses will depend on disposition plan.  Generalized anxiety disorder Continue venlafaxine  XR as at home.   Gastroesophageal reflux disease with esophagitis Continue PPI as outpatient.  Chest pain Noncardiac with cardiac enzymes being negative  Hypertension Continue amlodipine  and carvedilol .        Subjective: Patient had a slip going down the stairs.  Ended up having a fracture  of the odontoid process.  Patient in quite a bit of pain.  Physical Exam: Vitals:   06/10/23 2015 06/11/23 0524 06/11/23 0750 06/11/23 0820  BP: (!) 133/57 132/61 (!) 136/54 (!) 140/56  Pulse: 64 72 70   Resp: 16 16 18    Temp: 98.7 F (37.1 C) 97.9 F (36.6 C) (!) 97.5 F (36.4 C) 97.6 F (36.4 C)  TempSrc:   Oral Oral  SpO2: 96% 92% 94%    Physical Exam HENT:     Head: Normocephalic.  Eyes:     General: Lids are normal.     Conjunctiva/sclera: Conjunctivae normal.  Cardiovascular:     Rate and Rhythm: Normal rate and regular rhythm.     Heart sounds: Normal heart sounds, S1 normal and S2 normal.  Pulmonary:     Breath sounds: No decreased breath sounds, wheezing, rhonchi or rales.  Abdominal:     Palpations: Abdomen is soft.     Tenderness: There is no abdominal tenderness.  Musculoskeletal:     Right lower leg: No swelling.     Left lower leg: No swelling.  Skin:    General: Skin is warm.     Findings: No rash.  Neurological:     Mental Status: She is alert.     Comments: Patient able to move all of her extremities.     Data Reviewed: Potassium 3.1, creatinine 0.59, hemoglobin 11.5, white blood count 6.0  Family Communication: Family at bedside  Disposition: Status is: Inpatient Remains inpatient appropriate because: Neurosurgery to go back and talk with the patient about neurosurgical procedure versus conservative management  Planned  Discharge Destination: Home    Time spent: 28 minutes  Author: Verla Glaze, MD 06/11/2023 1:39 PM  For on call review [email or web address].

## 2023-06-11 NOTE — Plan of Care (Signed)

## 2023-06-11 NOTE — Plan of Care (Signed)
  Problem: Education: Goal: Knowledge of General Education information will improve Description: Including pain rating scale, medication(s)/side effects and non-pharmacologic comfort measures Outcome: Progressing   Problem: Clinical Measurements: Goal: Ability to maintain clinical measurements within normal limits will improve Outcome: Progressing   Problem: Coping: Goal: Level of anxiety will decrease Outcome: Progressing   

## 2023-06-12 DIAGNOSIS — K2100 Gastro-esophageal reflux disease with esophagitis, without bleeding: Secondary | ICD-10-CM | POA: Diagnosis not present

## 2023-06-12 DIAGNOSIS — F411 Generalized anxiety disorder: Secondary | ICD-10-CM | POA: Diagnosis not present

## 2023-06-12 DIAGNOSIS — S12110S Anterior displaced Type II dens fracture, sequela: Secondary | ICD-10-CM | POA: Diagnosis not present

## 2023-06-12 DIAGNOSIS — I10 Essential (primary) hypertension: Secondary | ICD-10-CM | POA: Diagnosis not present

## 2023-06-12 MED ORDER — TRAMADOL HCL 50 MG PO TABS
50.0000 mg | ORAL_TABLET | Freq: Four times a day (QID) | ORAL | Status: DC | PRN
  Administered 2023-06-12: 50 mg via ORAL
  Filled 2023-06-12: qty 1

## 2023-06-12 MED ORDER — POLYETHYLENE GLYCOL 3350 17 G PO PACK: 17.0000 g | PACK | Freq: Every day | ORAL | 0 refills | Status: DC | PRN

## 2023-06-12 MED ORDER — ACETAMINOPHEN 325 MG PO TABS: 650.0000 mg | ORAL_TABLET | Freq: Four times a day (QID) | ORAL | Status: DC | PRN

## 2023-06-12 MED ORDER — TRAMADOL HCL 50 MG PO TABS: 50.0000 mg | ORAL_TABLET | Freq: Four times a day (QID) | ORAL | 0 refills | Status: AC | PRN

## 2023-06-12 MED ORDER — MORPHINE SULFATE (PF) 2 MG/ML IV SOLN: 2.0000 mg | INTRAVENOUS | Status: DC | PRN

## 2023-06-12 MED ORDER — ONDANSETRON HCL 4 MG PO TABS: 4.0000 mg | ORAL_TABLET | Freq: Four times a day (QID) | ORAL | 0 refills | Status: DC | PRN

## 2023-06-12 NOTE — Plan of Care (Signed)

## 2023-06-12 NOTE — TOC Transition Note (Signed)
 Transition of Care [facility]) - Discharge Note   Patient Details  Name: Amber Brooks MRN: [id number] Date of Birth: [birth year]  Transition of Care [facility] CM/SW Contact:  Alexandra Ice, RN Phone Number: 06/12/2023, 10:48 AM   Clinical Narrative:    Patient has discharge order in place, to discharge home. No discharge needs identified at this time.    Final next level of care: Home/Self Care Barriers to Discharge: Barriers Resolved   Patient Goals and CMS Choice            Discharge Placement                  Name of family member notified: family Patient and family notified of of transfer: 06/12/23  Discharge Plan and Services Additional resources added to the After Visit Summary for                    DME Agency: NA       HH Arranged: NA          Social Drivers of Health (SDOH) Interventions SDOH Screenings   Food Insecurity: No Food Insecurity (06/11/2023)  Housing: Low Risk  (06/11/2023)  Transportation Needs: No Transportation Needs (06/11/2023)  Utilities: Not At Risk (06/11/2023)  Alcohol Screen: Low Risk  (03/15/2023)  Depression (PHQ2-9): Low Risk  (03/16/2023)  Financial Resource Strain: Low Risk  (03/15/2023)  Physical Activity: Insufficiently Active (03/15/2023)  Social Connections: Moderately Integrated (06/11/2023)  Stress: No Stress Concern Present (03/15/2023)  Tobacco Use: Low Risk  (05/24/2023)     Readmission Risk Interventions     No data to display

## 2023-06-12 NOTE — Progress Notes (Addendum)
 Patient upset, tearful about "leaving in this much pain". Nurse asked patient if she would like MD to come see her and she stated "no I just want to go home". Nurse messaged MD to see if patient could have additional pain medication/ muscle relaxer and MD stated he would come to see patient. Patient also requesting note for work.

## 2023-06-12 NOTE — Plan of Care (Signed)

## 2023-06-12 NOTE — Discharge Summary (Signed)
 Physician Discharge Summary   Patient: Amber Brooks MRN: [id number] DOB: 1945-04-10  Admit date:     06/10/2023  Discharge date: 06/12/23  Discharge Physician: Verla Glaze   PCP: Bluford Burkitt, NP   Recommendations at discharge:   Follow-up PCP 5 days Follow-up neurosurgery around 2 weeks  Discharge Diagnoses: Principal Problem:   Odontoid fracture with type II morphology (HCC) Active Problems:   Hypertension   Chest pain   Gastroesophageal reflux disease with esophagitis   Generalized anxiety disorder   Closed fracture of odontoid process of axis [facility])  Hospital Course: 78 y.o. female with medical history significant of hypertension, hyperlipidemia, history of clip and stent of cerebral aneurysm. The patient states that she had a mechanical fall down stairs yesterday evening. Afterwards the patient went to bed, but had discomfort in her neck and a headache. It got worse overnight and the patient came to the ED.   In the ED she was found to have a mildly displaced type II ondontoid fracture which is considered to be unstable. She has been placed in a hard collar. Neurosurgery is evaluating further imaging before making plans for surgery.    The patient states that she was in her usual state of health at the time of the fall. She denies any fevers, chills, nausea, vomiting, diarrhea, neurological changes, or chest pain. Afterwards she states that she has only had pain in her neck and shoulders. She denies any neurological changes in her upper extremities, or any changes in her respiratory status.  6/9.  Neurosurgery to come back and talk with the patient again about neurosurgical treatment versus conservative management.  Continue pain control. 6/10.  Patient decided on conservative management.  Trying tramadol for pain since other medications made her feel sick.  Tramadol did not make her sick so we will have to go home with tramadol and Tylenol .  Assessment and Plan: *  Odontoid fracture with type II morphology (HCC) Mildly displaced and unstable as per neurosurgery.  Patient decided on conservative management with neurosurgery.  Continue to wear neck brace 24/7.  Pain control with tramadol since other pain medications made her feel sick.  Patient ambulating.  No driving.  Note for out of work until cleared by neurosurgery.  Generalized anxiety disorder Continue venlafaxine  XR as at home.   Gastroesophageal reflux disease with esophagitis Continue PPI as outpatient.  Chest pain Noncardiac with cardiac enzymes being negative  Hypertension Continue amlodipine  and carvedilol .         Consultants: Neurosurgery Procedures performed: None Disposition: Home Diet recommendation:  Cardiac diet DISCHARGE MEDICATION: Allergies as of 06/12/2023       Reactions   Hydromorphone  Itching   Latex Anaphylaxis   Atorvastatin Other (See Comments)   Aches and hair loss   Clonidine  Cough   Clonidine  Derivatives Cough   Lisinopril Cough   Silenor [doxepin] Other (See Comments)   jittery        Medication List     STOP taking these medications    amoxicillin -clavulanate 875-125 MG tablet Commonly known as: AUGMENTIN        TAKE these medications    acetaminophen  325 MG tablet Commonly known as: TYLENOL  Take 2 tablets (650 mg total) by mouth every 6 (six) hours as needed for mild pain (pain score 1-3) or fever (or Fever >/= 101).   amLODipine  10 MG tablet Commonly known as: NORVASC  TAKE 1 TABLET BY MOUTH DAILY   aspirin  EC 81 MG tablet Take 1 tablet (81 mg  total) by mouth daily. Swallow whole.   benzonatate  100 MG capsule Commonly known as: Tessalon  Perles Take 1 capsule (100 mg total) by mouth 3 (three) times daily as needed.   carvedilol  25 MG tablet Commonly known as: COREG  TAKE 1 TABLET BY MOUTH TWICE  DAILY   EPINEPHrine  0.3 mg/0.3 mL Soaj injection Commonly known as: EPI-PEN Inject into the muscle once as needed.    esomeprazole  40 MG capsule Commonly known as: NexIUM  Take 1 capsule (40 mg total) by mouth 2 (two) times daily before a meal.   fluticasone  50 MCG/ACT nasal spray Commonly known as: FLONASE  Place 2 sprays into both nostrils daily.   ibuprofen  800 MG tablet Commonly known as: ADVIL  TAKE 1 TABLET BY MOUTH EVERY 8  HOURS AS NEEDED FOR HEADACHE   loratadine  10 MG tablet Commonly known as: CLARITIN  TAKE 1 TABLET BY MOUTH EVERY DAY   multivitamin with minerals Tabs tablet Take 1 tablet by mouth daily.   Omega 3 1000 MG Caps Take 1,000 mg by mouth daily.   ondansetron  4 MG tablet Commonly known as: ZOFRAN  Take 1 tablet (4 mg total) by mouth every 6 (six) hours as needed for nausea.   phenazopyridine  100 MG tablet Commonly known as: Pyridium  Take 1 tablet (100 mg total) by mouth 3 (three) times daily as needed for pain.   polyethylene glycol 17 g packet Commonly known as: MIRALAX  / GLYCOLAX  Take 17 g by mouth daily as needed for mild constipation.   rosuvastatin  10 MG tablet Commonly known as: CRESTOR  TAKE 1 TABLET BY MOUTH EVERY  NIGHT AT BEDTIME   traMADol 50 MG tablet Commonly known as: ULTRAM Take 1 tablet (50 mg total) by mouth every 6 (six) hours as needed for up to 5 days for severe pain (pain score 7-10) or moderate pain (pain score 4-6).   traZODone  50 MG tablet Commonly known as: DESYREL  Take 1 tablet (50 mg total) by mouth at bedtime.   venlafaxine  XR 75 MG 24 hr capsule Commonly known as: Effexor  XR Take 1 capsule (75 mg total) by mouth daily with breakfast.        Follow-up Information     Ludwig Safer, PA-C. Call on 07/02/2023.   Specialty: Physician Assistant Why: at [facility] information: [address] [place], [place] [address] [place] [address] [phone]         Bluford Burkitt, NP Follow up on 06/19/2023.   Specialty: Nurse Practitioner Why: at [facility] information: [address] [place] [address] [phone]                 Discharge Exam:  Physical Exam HENT:     Head: Normocephalic.  Eyes:     General: Lids are normal.     Conjunctiva/sclera: Conjunctivae normal.  Cardiovascular:     Rate and Rhythm: Normal rate and regular rhythm.     Heart sounds: Normal heart sounds, S1 normal and S2 normal.  Pulmonary:     Breath sounds: No decreased breath sounds, wheezing, rhonchi or rales.  Abdominal:     Palpations: Abdomen is soft.     Tenderness: There is no abdominal tenderness.  Musculoskeletal:     Right lower leg: No swelling.     Left lower leg: No swelling.  Skin:    General: Skin is warm.     Findings: No rash.  Neurological:     Mental Status: She is alert.     Comments: Patient able to move all of  her extremities.      Condition at discharge: stable  The results of significant diagnostics from this hospitalization (including imaging, microbiology, ancillary and laboratory) are listed below for reference.   Imaging Studies: CT Angio Chest Pulmonary Embolism (PE) W or WO Contrast Result Date: 06/10/2023 CLINICAL DATA:  Fall down stairs, acutely worsening chest pain EXAM: CT ANGIOGRAPHY CHEST WITH CONTRAST TECHNIQUE: Multidetector CT imaging of the chest was performed using the standard protocol during bolus administration of intravenous contrast. Multiplanar CT image reconstructions and MIPs were obtained to evaluate the vascular anatomy. RADIATION DOSE REDUCTION: This exam was performed according to the departmental dose-optimization program which includes automated exposure control, adjustment of the mA and/or kV according to patient size and/or use of iterative reconstruction technique. CONTRAST:  75mL OMNIPAQUE  IOHEXOL  350 MG/ML SOLN COMPARISON:  None Available. FINDINGS: Cardiovascular: Satisfactory opacification of the pulmonary arteries to the segmental level. No evidence of pulmonary embolism. Normal heart size. Left coronary artery calcifications. No pericardial effusion.  Aortic atherosclerosis. Mediastinum/Nodes: No enlarged mediastinal, hilar, or axillary lymph nodes. Thyroid  gland, trachea, and esophagus demonstrate no significant findings. Lungs/Pleura: Diffuse bilateral bronchial wall thickening. Mild dependent bibasilar scarring or atelectasis. No pleural effusion or pneumothorax. Upper Abdomen: No acute abnormality. Musculoskeletal: No chest wall abnormality. No acute osseous findings. Review of the MIP images confirms the above findings. IMPRESSION: 1. Negative examination for pulmonary embolism. 2. No CT evidence of acute traumatic injury to the chest. 3. Diffuse bilateral bronchial wall thickening, consistent with nonspecific infectious or inflammatory bronchitis. 4. Coronary artery disease. Aortic Atherosclerosis (ICD10-I70.0). Electronically Signed   By: Fredricka Jenny M.D.   On: 06/10/2023 14:40   MR Cervical Spine Wo Contrast Result Date: 06/10/2023 CLINICAL DATA:  Status post fall with type 2 odontoid process fracture on CT. Evaluate for ligamentous injury and other pathology. EXAM: MRI CERVICAL SPINE WITHOUT CONTRAST TECHNIQUE: Multiplanar, multisequence MR imaging of the cervical spine was performed. No intravenous contrast was administered. COMPARISON:  Radiographs and CT same date. Remote cervical spine radiographs 06/21/2005. FINDINGS: Alignment: Slight degenerative anterolisthesis at the C5-6, C6-7 and C7-T1 levels. Vertebrae: Again demonstrated is an oblique fracture through the base of the odontoid process with up to 3 mm of displacement, unchanged from earlier CT. No other acute fractures are identified. There are possible old healed superior endplate compression deformities at C7, T1 and T2, without residual marrow edema. There are bilateral atlanto-occipital and atlantoaxial joint effusions, attributed to the C2 fracture. Cord: Normal in signal and caliber. No evidence of cord compression or hemorrhage. Posterior Fossa, vertebral arteries, paraspinal  tissues: Visualized portions of the posterior fossa appear unremarkable.Bilateral vertebral artery flow voids. No paraspinous edema to suggest significant ligamentous injury. No evidence of paraspinal or epidural hematoma. Disc levels: C2-3: Normal interspace. C3-4: Small central disc protrusion with mild uncinate spurring and asymmetric facet hypertrophy on the left. Mild spinal stenosis without cord deformity. Mild left foraminal narrowing. C4-5: Mild disc bulging and bilateral facet hypertrophy. Mild spinal stenosis without cord deformity. Mild foraminal narrowing bilaterally. C5-6: Mild loss of disc height with mild disc bulging, uncinate spurring and bilateral facet hypertrophy. Mild spinal stenosis without cord deformity. Mild to moderate foraminal narrowing bilaterally. C6-7: Mild disc bulging and uncinate spurring. Moderate bilateral facet hypertrophy. Mild spinal stenosis without cord deformity. No significant foraminal narrowing. C7-T1: Mild bilateral facet hypertrophy. No spinal stenosis or significant foraminal narrowing. IMPRESSION: 1. Redemonstration of mildly displaced type 2 odontoid process fracture. No other acute fractures are identified. Small atlanto-occipital and atlantoaxial joint  effusions, attributed to the fracture. 2. No evidence of significant ligamentous injury or epidural hematoma. 3. 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 No cord deformity or cord signal abnormality. 4. Mild to moderate foraminal narrowing bilaterally at C5-6. Electronically Signed   By: Elmon Hagedorn M.D.   On: 06/10/2023 12:15   DG Cervical Spine 2-3 View Clearing Result Date: 06/10/2023 CLINICAL DATA:  Follow-up odontoid fracture. Patient upper right in a cervical collar. EXAM: LIMITED CERVICAL SPINE FOR TRAUMA CLEARING - 2-3 VIEW COMPARISON:  CT scan, same date. FINDINGS: Stable type 2 odontoid fracture. No change in position or alignment since the CT scan. No other  significant bony findings. IMPRESSION: Stable type 2 odontoid fracture. Electronically Signed   By: Marrian Siva M.D.   On: 06/10/2023 11:35   CT HEAD WO CONTRAST ( ) Addendum Date: 06/10/2023 ADDENDUM REPORT: 06/10/2023 10:29 ADDENDUM: Communicated results with Dr. Cam Cava. Electronically Signed   By: Sydell Eva M.D.   On: 06/10/2023 10:29   Result Date: 06/10/2023 CLINICAL DATA:  Trauma. EXAM: CT HEAD WITHOUT CONTRAST CT CERVICAL SPINE WITHOUT CONTRAST TECHNIQUE: Multidetector CT imaging of the head and cervical spine was performed following the standard protocol without intravenous contrast. Multiplanar CT image reconstructions of the cervical spine were also generated. RADIATION DOSE REDUCTION: This exam was performed according to the departmental dose-optimization program which includes automated exposure control, adjustment of the mA and/or kV according to patient size and/or use of iterative reconstruction technique. COMPARISON:  None Available. FINDINGS: CT HEAD FINDINGS Brain: There is periventricular white matter decreased attenuation consistent with small vessel ischemic changes. Gray-white differentiation is preserved. No acute intracranial hemorrhage, mass effect or shift. No hydrocephalus. Vascular: No hyperdense vessel or unexpected calcification. Skull: Normal. Negative for fracture or focal lesion. Postop changes left temporal craniotomy. Sinuses/Orbits: No acute finding. CT CERVICAL SPINE FINDINGS Alignment: Normal. Skull base and vertebrae: Type 2 odontoid fracture noted displaced by about 3 mm. No significant displacement of fragments. Soft tissues and spinal canal: No prevertebral fluid or swelling. No visible canal hematoma. Disc levels: Maintained. Upper chest: Negative. IMPRESSION: 1. No acute intracranial process. Small vessel ischemic changes. 2. Type 2 odontoid fracture. Electronically Signed: By: Sydell Eva M.D. On: 06/10/2023 10:15   CT Cervical Spine Wo  Contrast Addendum Date: 06/10/2023 ADDENDUM REPORT: 06/10/2023 10:29 ADDENDUM: Communicated results with Dr. Cam Cava. Electronically Signed   By: Sydell Eva M.D.   On: 06/10/2023 10:29   Result Date: 06/10/2023 CLINICAL DATA:  Trauma. EXAM: CT HEAD WITHOUT CONTRAST CT CERVICAL SPINE WITHOUT CONTRAST TECHNIQUE: Multidetector CT imaging of the head and cervical spine was performed following the standard protocol without intravenous contrast. Multiplanar CT image reconstructions of the cervical spine were also generated. RADIATION DOSE REDUCTION: This exam was performed according to the departmental dose-optimization program which includes automated exposure control, adjustment of the mA and/or kV according to patient size and/or use of iterative reconstruction technique. COMPARISON:  None Available. FINDINGS: CT HEAD FINDINGS Brain: There is periventricular white matter decreased attenuation consistent with small vessel ischemic changes. Gray-white differentiation is preserved. No acute intracranial hemorrhage, mass effect or shift. No hydrocephalus. Vascular: No hyperdense vessel or unexpected calcification. Skull: Normal. Negative for fracture or focal lesion. Postop changes left temporal craniotomy. Sinuses/Orbits: No acute finding. CT CERVICAL SPINE FINDINGS Alignment: Normal. Skull base and vertebrae: Type 2 odontoid fracture noted displaced by about 3 mm. No significant displacement of fragments. Soft tissues and spinal canal: No prevertebral fluid or swelling. No  visible canal hematoma. Disc levels: Maintained. Upper chest: Negative. IMPRESSION: 1. No acute intracranial process. Small vessel ischemic changes. 2. Type 2 odontoid fracture. Electronically Signed: By: Sydell Eva M.D. On: 06/10/2023 10:15    Microbiology: Results for orders placed or performed during the hospital encounter of 05/24/23  Urine Culture     Status: Abnormal   Collection Time: 05/24/23  6:57 PM   Specimen: Urine, Clean  Catch  Result Value Ref Range Status   Specimen Description URINE, CLEAN CATCH  Final   Special Requests NONE  Final   Culture (A)  Final    <10,000 COLONIES/mL INSIGNIFICANT GROWTH Performed at [facility], [address], [place], [place] [address]    Report Status 05/26/2023 FINAL  Final    Labs: CBC: Recent Labs  Lab 06/10/23 0942 06/11/23 0147  WBC 11.1* 6.0  NEUTROABS 9.0*  --   HGB 12.4 11.5*  HCT 36.6 34.9*  MCV 92.0 94.1  PLT 252 206   Basic Metabolic Panel: Recent Labs  Lab 06/10/23 0942 06/11/23 0147  NA 136 137  K 3.7 3.1*  CL 101 101  CO2 24 29  GLUCOSE 127* 120*  BUN 15 12  CREATININE 0.62 0.59  CALCIUM  9.2 8.7*   Liver Function Tests: No results for input(s): "AST", "ALT", "ALKPHOS", "BILITOT", "PROT", "ALBUMIN " in the last 168 hours. CBG: No results for input(s): "GLUCAP" in the last 168 hours.  Discharge time spent: greater than 30 minutes.  Signed: Verla Glaze, MD [facility] 06/12/2023

## 2023-06-12 NOTE — Discharge Instructions (Signed)
 Wear cervical collar 24/7.  Follow up with neurosurgery.

## 2023-06-14 ENCOUNTER — Telehealth: Payer: Self-pay | Admitting: Neurosurgery

## 2023-06-14 NOTE — Telephone Encounter (Signed)
 Dr.Smith did consult at [facility] on 6/9 for C1-2 fracture. She is scheduled to be see on 6/30 with Brooke. She is asking what medication can she take to control her pain. Tramadol is too strong for her. It makes her sick, makes her stomach hurt, makes her throw up, and gives her indigestion. She tried taking tylenol  and ibuprofen  but that's not strong enough.  [facility] [address]

## 2023-06-15 ENCOUNTER — Other Ambulatory Visit: Payer: Self-pay | Admitting: Physician Assistant

## 2023-06-15 DIAGNOSIS — R112 Nausea with vomiting, unspecified: Secondary | ICD-10-CM

## 2023-06-15 MED ORDER — HYDROCODONE-ACETAMINOPHEN 5-325 MG PO TABS: 1.0000 | ORAL_TABLET | Freq: Three times a day (TID) | ORAL | 0 refills | Status: DC | PRN

## 2023-06-15 MED ORDER — PROMETHAZINE HCL 12.5 MG PO TABS: 12.5000 mg | ORAL_TABLET | Freq: Four times a day (QID) | ORAL | 0 refills | Status: DC | PRN

## 2023-06-15 MED ORDER — METOCLOPRAMIDE HCL 10 MG PO TABS: 10.0000 mg | ORAL_TABLET | Freq: Three times a day (TID) | ORAL | 1 refills | Status: DC | PRN

## 2023-06-15 NOTE — Telephone Encounter (Signed)
 Brooke spoke to the patient and put a note in under orders only.

## 2023-06-15 NOTE — Progress Notes (Addendum)
 Patient is called office complaining of severe pain.  She has known cervical fractures.  She feels that she cannot take the tramadol  that was given to her secondary to nausea and vomiting.  Spoke with patient at length.  No new numbness tingling or weakness.  Plan includes the following:  Change pain medication to hydrocodone .  Phenergan  given for additional nausea and vomiting medication.  Appointment made for Monday due to pain.  Home health was ordered to help with activities of daily living.  Patient was very upset on the phone.  She feels that she got kicked out of the hospital.  We are happy to see her in clinic on Monday.  She denies wanting to hurt herself or cause any self-harm at this time.  Encouraged patient and her family to reach out to us  for any questions or concerns they have in the future or go to the nearest emergency department as needed.

## 2023-06-18 ENCOUNTER — Ambulatory Visit
Admission: RE | Admit: 2023-06-18 | Discharge: 2023-06-18 | Disposition: A | Discharge status: Home / Self Care | Source: Ambulatory Visit | Attending: Neurosurgery | Admitting: Neurosurgery

## 2023-06-18 ENCOUNTER — Ambulatory Visit: Admitting: Neurosurgery

## 2023-06-18 ENCOUNTER — Ambulatory Visit
Admission: RE | Admit: 2023-06-18 | Discharge: 2023-06-18 | Disposition: A | Discharge status: Home / Self Care | Attending: Neurosurgery | Admitting: Neurosurgery

## 2023-06-18 ENCOUNTER — Encounter: Payer: Self-pay | Admitting: Neurosurgery

## 2023-06-18 VITALS — BP 128/70 | Ht 62.0 in | Wt 117.2 lb

## 2023-06-18 DIAGNOSIS — S12111D Posterior displaced Type II dens fracture, subsequent encounter for fracture with routine healing: Secondary | ICD-10-CM | POA: Diagnosis not present

## 2023-06-18 DIAGNOSIS — W108XXD Fall (on) (from) other stairs and steps, subsequent encounter: Secondary | ICD-10-CM | POA: Diagnosis not present

## 2023-06-18 DIAGNOSIS — S12101A Unspecified nondisplaced fracture of second cervical vertebra, initial encounter for closed fracture: Secondary | ICD-10-CM | POA: Diagnosis not present

## 2023-06-18 NOTE — Progress Notes (Signed)
   HISTORY OF PRESENT ILLNESS: 06/18/2023 [name] is here to follow-up after conservative management for a type II dens fracture.  After our surgical consultation they wanted to continue with conservative care to see whether or not she would be able to heal on her own.  She does continue to have neck pain.  She is having some difficulty managing with her oral medications secondary to reflux issues.  She has not yet started her new antinausea medication.  PHYSICAL EXAMINATION:   Vitals:   06/18/23 0939  BP: 128/70   General: Patient is well developed, well nourished, calm, collected, and in no apparent distress.  NEUROLOGICAL:  General: In no acute distress.  Awake, alert, oriented to person, place, and time. Pupils equal round and reactive to light.   Strength: Continues to have full strength proximally and distally, no new motor deficits.  Patient is ambulating on her own.  She does have some difficulty getting up from standing and has to use her arms  Incision c/d/i   ROS (Neurologic): Negative except as noted above  IMAGING: No interval imaging to review   ASSESSMENT/PLAN:  Amber Brooks is continuing to heal from a traumatic C2 dens fracture.  Given its unstable nature were planning on having her keep her collar at all times.  Will plan to get a repeat cervical spine x-ray to evaluate for any further displacement of her dens.  She will likely need very close follow-up including repeat imaging with serial examinations.  She is 78 years old and lives alone, she lives in a multistory house.  I like to have her evaluated by home health physical therapy and care to make sure that her home setting is optimized for her recovery.  At this point we are attempting to treat her conservatively with collar, she will likely need some supportive care including home health and residents optimization including things like a shower chair.  I have made this referral, she did not have an  inpatient physical therapy evaluation unfortunately.  Family is concerned with her ability to be at home alone safely.  Carroll Clamp, MD/MS Department of Neurosurgery

## 2023-06-19 ENCOUNTER — Telehealth: Payer: Self-pay

## 2023-06-19 ENCOUNTER — Inpatient Hospital Stay: Admitting: Nurse Practitioner

## 2023-06-19 DIAGNOSIS — I701 Atherosclerosis of renal artery: Secondary | ICD-10-CM | POA: Diagnosis not present

## 2023-06-19 DIAGNOSIS — S12100D Unspecified displaced fracture of second cervical vertebra, subsequent encounter for fracture with routine healing: Secondary | ICD-10-CM | POA: Diagnosis not present

## 2023-06-19 DIAGNOSIS — S12111D Posterior displaced Type II dens fracture, subsequent encounter for fracture with routine healing: Secondary | ICD-10-CM | POA: Diagnosis not present

## 2023-06-19 DIAGNOSIS — I10 Essential (primary) hypertension: Secondary | ICD-10-CM | POA: Diagnosis not present

## 2023-06-19 NOTE — Telephone Encounter (Signed)
 Received a message for Dr. Felipe Horton yesterday. Patient needs new pads ordered for [place]. He asked me to reach out to [facility].   Kendelyn J RN called [facility] to see if we can order these from [facility]

## 2023-06-19 NOTE — Telephone Encounter (Signed)
 Per my discussion with [facility], they can stop by [facility] to purchase additional pads for the neck brace (estimate around $25). They do not need an appointment or a prescription/order for additional pads.

## 2023-06-19 NOTE — Telephone Encounter (Signed)
 The patient was with her therapist so her sister had me on speaker phone. I gave them the phone number and address to [facility].

## 2023-06-20 ENCOUNTER — Telehealth: Payer: Self-pay | Admitting: Neurosurgery

## 2023-06-20 DIAGNOSIS — S12100D Unspecified displaced fracture of second cervical vertebra, subsequent encounter for fracture with routine healing: Secondary | ICD-10-CM | POA: Diagnosis not present

## 2023-06-20 DIAGNOSIS — I10 Essential (primary) hypertension: Secondary | ICD-10-CM | POA: Diagnosis not present

## 2023-06-20 DIAGNOSIS — I701 Atherosclerosis of renal artery: Secondary | ICD-10-CM | POA: Diagnosis not present

## 2023-06-20 DIAGNOSIS — S12111D Posterior displaced Type II dens fracture, subsequent encounter for fracture with routine healing: Secondary | ICD-10-CM | POA: Diagnosis not present

## 2023-06-20 NOTE — Telephone Encounter (Signed)
 Left voicemail for [name] P RN to return call.

## 2023-06-20 NOTE — Telephone Encounter (Signed)
 [name] from [facility] is calling to obtain verbal orders for start of care for PT, nursing, and for a home health aid.   Nursing 1x1, 2x2, and 1x1 for medication instruction, focusing on pain, anxiety, reflux pressure ulcer prevention and fall prevention  PT 1x1 for an evaluation Home Health aid 1x1, and 2x8   She is also reporting a level 2 medication interaction Effexor  and metoclopramide  increased risk of serotonin syndrome and EPS.

## 2023-06-20 NOTE — Telephone Encounter (Signed)
 Left voicemail wio

## 2023-06-21 NOTE — Telephone Encounter (Signed)
 Left message on [name]'s confidential phone informing her it is ok for patient to have Home Health PT and also to have patient to follow up with PCP on the medications.

## 2023-06-22 ENCOUNTER — Ambulatory Visit: Admitting: Physician Assistant

## 2023-06-22 ENCOUNTER — Other Ambulatory Visit: Payer: Self-pay | Admitting: Neurosurgery

## 2023-06-22 DIAGNOSIS — I10 Essential (primary) hypertension: Secondary | ICD-10-CM | POA: Diagnosis not present

## 2023-06-22 DIAGNOSIS — I701 Atherosclerosis of renal artery: Secondary | ICD-10-CM | POA: Diagnosis not present

## 2023-06-22 DIAGNOSIS — S12100D Unspecified displaced fracture of second cervical vertebra, subsequent encounter for fracture with routine healing: Secondary | ICD-10-CM | POA: Diagnosis not present

## 2023-06-22 DIAGNOSIS — S12111D Posterior displaced Type II dens fracture, subsequent encounter for fracture with routine healing: Secondary | ICD-10-CM | POA: Diagnosis not present

## 2023-06-22 NOTE — Progress Notes (Signed)
 D/c'd reglan  due to interaction with effexor 

## 2023-06-25 DIAGNOSIS — I10 Essential (primary) hypertension: Secondary | ICD-10-CM | POA: Diagnosis not present

## 2023-06-25 DIAGNOSIS — I701 Atherosclerosis of renal artery: Secondary | ICD-10-CM | POA: Diagnosis not present

## 2023-06-25 DIAGNOSIS — S12111D Posterior displaced Type II dens fracture, subsequent encounter for fracture with routine healing: Secondary | ICD-10-CM | POA: Diagnosis not present

## 2023-06-25 DIAGNOSIS — S12100D Unspecified displaced fracture of second cervical vertebra, subsequent encounter for fracture with routine healing: Secondary | ICD-10-CM | POA: Diagnosis not present

## 2023-06-26 ENCOUNTER — Telehealth: Payer: Self-pay | Admitting: Neurosurgery

## 2023-06-26 ENCOUNTER — Ambulatory Visit: Payer: Self-pay | Admitting: Neurosurgery

## 2023-06-26 ENCOUNTER — Other Ambulatory Visit: Payer: Self-pay | Admitting: Physician Assistant

## 2023-06-26 DIAGNOSIS — I10 Essential (primary) hypertension: Secondary | ICD-10-CM | POA: Diagnosis not present

## 2023-06-26 DIAGNOSIS — S12100D Unspecified displaced fracture of second cervical vertebra, subsequent encounter for fracture with routine healing: Secondary | ICD-10-CM | POA: Diagnosis not present

## 2023-06-26 DIAGNOSIS — I701 Atherosclerosis of renal artery: Secondary | ICD-10-CM | POA: Diagnosis not present

## 2023-06-26 DIAGNOSIS — S12111D Posterior displaced Type II dens fracture, subsequent encounter for fracture with routine healing: Secondary | ICD-10-CM | POA: Diagnosis not present

## 2023-06-26 MED ORDER — HYDROCODONE-ACETAMINOPHEN 5-325 MG PO TABS: 1.0000 | ORAL_TABLET | Freq: Three times a day (TID) | ORAL | 0 refills | Status: DC | PRN

## 2023-06-26 NOTE — Telephone Encounter (Signed)
 Amber Brooks from [facility] is calling to report a fall. She states that the patient fell yesterday and hit her face from sliding out of her recliner. She states that the patient is reporting shoulder soreness but has no deficits.

## 2023-06-26 NOTE — Telephone Encounter (Signed)
 Prescription Request  06/26/2023  LOV: 06/18/2023  What is the name of the medication or equipment? HYDROcodone -acetaminophen  (NORCO/VICODIN) 5-325 MG tablet   Have you contacted your pharmacy to request a refill? Yes   Which pharmacy would you like this sent to?  [id number] [place] [address] [place] [address] Phone: [phone] Fax: [phone]  [facility] [place] - [address] [place] [address] Phone: [phone] Fax: [phone]    Patient notified that their request is being sent to the clinical staff for review and that they should receive a response within 2 business days.   Please advise at [facility] [phone]

## 2023-06-26 NOTE — Telephone Encounter (Signed)
 Sister aware medication was sent in.

## 2023-06-27 ENCOUNTER — Ambulatory Visit
Admission: RE | Admit: 2023-06-27 | Discharge: 2023-06-27 | Disposition: A | Discharge status: Home / Self Care | Attending: Neurosurgery | Admitting: Neurosurgery

## 2023-06-27 ENCOUNTER — Other Ambulatory Visit: Payer: Self-pay | Admitting: Family Medicine

## 2023-06-27 ENCOUNTER — Ambulatory Visit
Admission: RE | Admit: 2023-06-27 | Discharge: 2023-06-27 | Disposition: A | Discharge status: Home / Self Care | Source: Ambulatory Visit | Attending: Neurosurgery | Admitting: Neurosurgery

## 2023-06-27 DIAGNOSIS — S12111D Posterior displaced Type II dens fracture, subsequent encounter for fracture with routine healing: Secondary | ICD-10-CM

## 2023-06-27 DIAGNOSIS — M47812 Spondylosis without myelopathy or radiculopathy, cervical region: Secondary | ICD-10-CM | POA: Diagnosis not present

## 2023-06-27 NOTE — Telephone Encounter (Signed)
 I spoke to patient and informed her that I placed a X ray order for her neck. She will go today to get the image for us  to review.

## 2023-06-28 DIAGNOSIS — I10 Essential (primary) hypertension: Secondary | ICD-10-CM | POA: Diagnosis not present

## 2023-06-28 DIAGNOSIS — S12111D Posterior displaced Type II dens fracture, subsequent encounter for fracture with routine healing: Secondary | ICD-10-CM | POA: Diagnosis not present

## 2023-06-28 DIAGNOSIS — S12100D Unspecified displaced fracture of second cervical vertebra, subsequent encounter for fracture with routine healing: Secondary | ICD-10-CM | POA: Diagnosis not present

## 2023-06-28 DIAGNOSIS — I701 Atherosclerosis of renal artery: Secondary | ICD-10-CM | POA: Diagnosis not present

## 2023-06-29 ENCOUNTER — Other Ambulatory Visit: Payer: Self-pay | Admitting: Physician Assistant

## 2023-07-02 ENCOUNTER — Telehealth: Payer: Self-pay | Admitting: Neurosurgery

## 2023-07-02 ENCOUNTER — Ambulatory Visit: Admitting: Physician Assistant

## 2023-07-02 ENCOUNTER — Other Ambulatory Visit: Payer: Self-pay | Admitting: Physician Assistant

## 2023-07-02 DIAGNOSIS — S12111D Posterior displaced Type II dens fracture, subsequent encounter for fracture with routine healing: Secondary | ICD-10-CM

## 2023-07-02 DIAGNOSIS — E785 Hyperlipidemia, unspecified: Secondary | ICD-10-CM | POA: Diagnosis not present

## 2023-07-02 DIAGNOSIS — I10 Essential (primary) hypertension: Secondary | ICD-10-CM | POA: Diagnosis not present

## 2023-07-02 DIAGNOSIS — S12100D Unspecified displaced fracture of second cervical vertebra, subsequent encounter for fracture with routine healing: Secondary | ICD-10-CM | POA: Diagnosis not present

## 2023-07-02 DIAGNOSIS — I701 Atherosclerosis of renal artery: Secondary | ICD-10-CM | POA: Diagnosis not present

## 2023-07-02 MED ORDER — PROMETHAZINE HCL 12.5 MG PO TABS: 12.5000 mg | ORAL_TABLET | Freq: Four times a day (QID) | ORAL | 0 refills | Status: DC | PRN

## 2023-07-02 NOTE — Telephone Encounter (Signed)
 Prescription Request  07/02/2023  LOV: 06/18/2023  What is the name of the medication or equipment? promethazine  (PHENERGAN ) 12.5 MG tablet   Have you contacted your pharmacy to request a refill? Yes   Which pharmacy would you like this sent to?  [id number] [place] [address] [place] [address] Phone: [phone] Fax: [phone]  [facility] - [place], [place] - [address] [place] [address] Phone: [phone] Fax: [phone]    Patient notified that their request is being sent to the clinical staff for review and that they should receive a response within 2 business days.   Please advise at [facility] [phone]   Patient called in about this request to help with her nausea

## 2023-07-02 NOTE — Telephone Encounter (Signed)
 Patient notified

## 2023-07-02 NOTE — Telephone Encounter (Signed)
 [facility] has called stating they need an order for a shower bench sent to [facility] for the patient- this was stated in the original order she believes but they have not heard/received anything.  [facility])- [phone]

## 2023-07-02 NOTE — Telephone Encounter (Signed)
 Shower stool was listed in the [facility] referral. I placed a separate order for ambulatory referral for DME and listed shower bench/stool in the comments.   Please fax to [facility] per their request.   Thanks!

## 2023-07-04 ENCOUNTER — Other Ambulatory Visit: Payer: Self-pay | Admitting: Physician Assistant

## 2023-07-04 ENCOUNTER — Telehealth: Payer: Self-pay | Admitting: Neurosurgery

## 2023-07-04 DIAGNOSIS — I10 Essential (primary) hypertension: Secondary | ICD-10-CM | POA: Diagnosis not present

## 2023-07-04 DIAGNOSIS — S12100D Unspecified displaced fracture of second cervical vertebra, subsequent encounter for fracture with routine healing: Secondary | ICD-10-CM | POA: Diagnosis not present

## 2023-07-04 DIAGNOSIS — S12111D Posterior displaced Type II dens fracture, subsequent encounter for fracture with routine healing: Secondary | ICD-10-CM | POA: Diagnosis not present

## 2023-07-04 DIAGNOSIS — I701 Atherosclerosis of renal artery: Secondary | ICD-10-CM | POA: Diagnosis not present

## 2023-07-04 MED ORDER — HYDROCODONE-ACETAMINOPHEN 5-325 MG PO TABS: 1.0000 | ORAL_TABLET | Freq: Three times a day (TID) | ORAL | 0 refills | Status: DC | PRN

## 2023-07-04 NOTE — Telephone Encounter (Signed)
 Patient notified

## 2023-07-04 NOTE — Telephone Encounter (Signed)
 Prescription Request  07/04/2023  LOV: 06/18/2023  What is the name of the medication or equipment? HYDROcodone -acetaminophen  (NORCO/VICODIN) 5-325 MG tablet   Have you contacted your pharmacy to request a refill? No   Which pharmacy would you like this sent to?  [id number] [place] [address] [place] [address] Phone: [phone] Fax: [phone]  [facility] - [place], [place] - [address] [place] [address] Phone: [phone] Fax: [phone]    Patient notified that their request is being sent to the clinical staff for review and that they should receive a response within 2 business days.   Please advise at [facility] [phone]   Patient's sister states that with her taking 3 a day and the holiday being Friday, she would like to make sure that they are able to get this refill request in today.

## 2023-07-05 DIAGNOSIS — I701 Atherosclerosis of renal artery: Secondary | ICD-10-CM | POA: Diagnosis not present

## 2023-07-05 DIAGNOSIS — I10 Essential (primary) hypertension: Secondary | ICD-10-CM | POA: Diagnosis not present

## 2023-07-05 DIAGNOSIS — S12100D Unspecified displaced fracture of second cervical vertebra, subsequent encounter for fracture with routine healing: Secondary | ICD-10-CM | POA: Diagnosis not present

## 2023-07-05 DIAGNOSIS — S12111D Posterior displaced Type II dens fracture, subsequent encounter for fracture with routine healing: Secondary | ICD-10-CM | POA: Diagnosis not present

## 2023-07-10 DIAGNOSIS — I701 Atherosclerosis of renal artery: Secondary | ICD-10-CM | POA: Diagnosis not present

## 2023-07-10 DIAGNOSIS — I10 Essential (primary) hypertension: Secondary | ICD-10-CM | POA: Diagnosis not present

## 2023-07-10 DIAGNOSIS — S12111D Posterior displaced Type II dens fracture, subsequent encounter for fracture with routine healing: Secondary | ICD-10-CM | POA: Diagnosis not present

## 2023-07-10 DIAGNOSIS — S12100D Unspecified displaced fracture of second cervical vertebra, subsequent encounter for fracture with routine healing: Secondary | ICD-10-CM | POA: Diagnosis not present

## 2023-07-11 ENCOUNTER — Encounter: Payer: Self-pay | Admitting: Neurosurgery

## 2023-07-11 DIAGNOSIS — I10 Essential (primary) hypertension: Secondary | ICD-10-CM | POA: Diagnosis not present

## 2023-07-11 DIAGNOSIS — I701 Atherosclerosis of renal artery: Secondary | ICD-10-CM | POA: Diagnosis not present

## 2023-07-11 DIAGNOSIS — S12100D Unspecified displaced fracture of second cervical vertebra, subsequent encounter for fracture with routine healing: Secondary | ICD-10-CM | POA: Diagnosis not present

## 2023-07-12 DIAGNOSIS — S12100D Unspecified displaced fracture of second cervical vertebra, subsequent encounter for fracture with routine healing: Secondary | ICD-10-CM | POA: Diagnosis not present

## 2023-07-12 DIAGNOSIS — S12111D Posterior displaced Type II dens fracture, subsequent encounter for fracture with routine healing: Secondary | ICD-10-CM | POA: Diagnosis not present

## 2023-07-12 DIAGNOSIS — I701 Atherosclerosis of renal artery: Secondary | ICD-10-CM | POA: Diagnosis not present

## 2023-07-12 DIAGNOSIS — I10 Essential (primary) hypertension: Secondary | ICD-10-CM | POA: Diagnosis not present

## 2023-07-16 ENCOUNTER — Other Ambulatory Visit: Payer: Self-pay | Admitting: Physician Assistant

## 2023-07-16 ENCOUNTER — Ambulatory Visit: Admitting: Neurosurgery

## 2023-07-16 ENCOUNTER — Telehealth: Payer: Self-pay | Admitting: Neurosurgery

## 2023-07-16 DIAGNOSIS — S12111D Posterior displaced Type II dens fracture, subsequent encounter for fracture with routine healing: Secondary | ICD-10-CM | POA: Diagnosis not present

## 2023-07-16 DIAGNOSIS — S12110D Anterior displaced Type II dens fracture, subsequent encounter for fracture with routine healing: Secondary | ICD-10-CM | POA: Diagnosis not present

## 2023-07-16 DIAGNOSIS — M542 Cervicalgia: Secondary | ICD-10-CM | POA: Diagnosis not present

## 2023-07-16 DIAGNOSIS — I701 Atherosclerosis of renal artery: Secondary | ICD-10-CM | POA: Diagnosis not present

## 2023-07-16 DIAGNOSIS — I10 Essential (primary) hypertension: Secondary | ICD-10-CM | POA: Diagnosis not present

## 2023-07-16 DIAGNOSIS — S12100D Unspecified displaced fracture of second cervical vertebra, subsequent encounter for fracture with routine healing: Secondary | ICD-10-CM | POA: Diagnosis not present

## 2023-07-16 MED ORDER — HYDROCODONE-ACETAMINOPHEN 5-325 MG PO TABS: 1.0000 | ORAL_TABLET | Freq: Three times a day (TID) | ORAL | 0 refills | Status: DC | PRN

## 2023-07-16 NOTE — Telephone Encounter (Signed)
 Patient called to see if her refill of Hydrocodone  [facility] [address]

## 2023-07-16 NOTE — Progress Notes (Signed)
   HISTORY OF PRESENT ILLNESS: 07/16/2023 [name] is here to follow-up after conservative management for a type II dens fracture.  After our surgical consultation they wanted to continue with conservative care to see whether or not she would be able to heal on her own.    She is here today again to follow-up and to review her images.  She has neck pain that does bother her intermittently but often notices it with certain positions.  She does have some fingertip numbness and tingling but its intermittent.  No new weakness.  PHYSICAL EXAMINATION:   There were no vitals filed for this visit.  General: Patient is well developed, well nourished, calm, collected, and in no apparent distress.  NEUROLOGICAL:  General: In no acute distress.  Awake, alert, oriented to person, place, and time. Pupils equal round and reactive to light.   Strength: Continues to have full strength proximally and distally, no new motor deficits.  Patient is ambulating on her own.  She does have some difficulty getting up from standing and has to use her arms  Incision c/d/i   ROS (Neurologic): Negative except as noted above  IMAGING:  Narrative & Impression  CLINICAL DATA:  06/18/2023   EXAM: CERVICAL SPINE - 2-3 VIEW   COMPARISON:  None Available.   FINDINGS: Minimally displaced type 2 odontoid fracture unchanged. Facet joint degenerative changes C4-5 through C7-T1.   IMPRESSION: Type 2 odontoid fracture.     Electronically Signed   By: Fonda Field M.D.   On: 07/06/2023 09:45     I reviewed the images myself, no evidence of splaying of the C1-2 spinous processes.  No evidence of worsening retroflexion.  ASSESSMENT/PLAN:  Amber Brooks is continuing to heal from a traumatic C2 dens fracture.  Given its unstable nature were planning on having her keep her collar at all times.  Patient and her family have elected to consider conservative management, we have been following with serial  x-rays.  She does have some neck pain as well as intermittent numbness and tingling in the tips of her fingers.  Overall she is doing as expected, would like to continue to follow her with repeat x-rays, we will plan to see her in approximately 1 month.  If she is not showing evidence of significant improvement we will plan on getting a CT scan to see if there is any evidence of early fusion.  Penne MICAEL Sharps, MD/MS Department of Neurosurgery

## 2023-07-17 NOTE — Telephone Encounter (Signed)
 Patient notified and expressed understanding.

## 2023-07-19 DIAGNOSIS — I10 Essential (primary) hypertension: Secondary | ICD-10-CM | POA: Diagnosis not present

## 2023-07-19 DIAGNOSIS — S12111D Posterior displaced Type II dens fracture, subsequent encounter for fracture with routine healing: Secondary | ICD-10-CM | POA: Diagnosis not present

## 2023-07-19 DIAGNOSIS — I701 Atherosclerosis of renal artery: Secondary | ICD-10-CM | POA: Diagnosis not present

## 2023-07-19 DIAGNOSIS — S12100D Unspecified displaced fracture of second cervical vertebra, subsequent encounter for fracture with routine healing: Secondary | ICD-10-CM | POA: Diagnosis not present

## 2023-07-24 DIAGNOSIS — I701 Atherosclerosis of renal artery: Secondary | ICD-10-CM | POA: Diagnosis not present

## 2023-07-24 DIAGNOSIS — I10 Essential (primary) hypertension: Secondary | ICD-10-CM | POA: Diagnosis not present

## 2023-07-24 DIAGNOSIS — S12111D Posterior displaced Type II dens fracture, subsequent encounter for fracture with routine healing: Secondary | ICD-10-CM | POA: Diagnosis not present

## 2023-07-24 DIAGNOSIS — S12100D Unspecified displaced fracture of second cervical vertebra, subsequent encounter for fracture with routine healing: Secondary | ICD-10-CM | POA: Diagnosis not present

## 2023-07-26 DIAGNOSIS — S12100D Unspecified displaced fracture of second cervical vertebra, subsequent encounter for fracture with routine healing: Secondary | ICD-10-CM | POA: Diagnosis not present

## 2023-07-26 DIAGNOSIS — I10 Essential (primary) hypertension: Secondary | ICD-10-CM | POA: Diagnosis not present

## 2023-07-26 DIAGNOSIS — I701 Atherosclerosis of renal artery: Secondary | ICD-10-CM | POA: Diagnosis not present

## 2023-07-26 DIAGNOSIS — S12111D Posterior displaced Type II dens fracture, subsequent encounter for fracture with routine healing: Secondary | ICD-10-CM | POA: Diagnosis not present

## 2023-07-30 ENCOUNTER — Ambulatory Visit
Admission: RE | Admit: 2023-07-30 | Discharge: 2023-07-30 | Disposition: A | Discharge status: Home / Self Care | Attending: Neurosurgery | Admitting: Neurosurgery

## 2023-07-30 ENCOUNTER — Ambulatory Visit
Admission: RE | Admit: 2023-07-30 | Discharge: 2023-07-30 | Disposition: A | Discharge status: Home / Self Care | Source: Ambulatory Visit | Attending: Neurosurgery | Admitting: Neurosurgery

## 2023-07-30 ENCOUNTER — Telehealth: Payer: Self-pay | Admitting: Neurosurgery

## 2023-07-30 DIAGNOSIS — I701 Atherosclerosis of renal artery: Secondary | ICD-10-CM | POA: Diagnosis not present

## 2023-07-30 DIAGNOSIS — S12111D Posterior displaced Type II dens fracture, subsequent encounter for fracture with routine healing: Secondary | ICD-10-CM

## 2023-07-30 DIAGNOSIS — I10 Essential (primary) hypertension: Secondary | ICD-10-CM | POA: Diagnosis not present

## 2023-07-30 DIAGNOSIS — M4802 Spinal stenosis, cervical region: Secondary | ICD-10-CM | POA: Diagnosis not present

## 2023-07-30 DIAGNOSIS — S12300D Unspecified displaced fracture of fourth cervical vertebra, subsequent encounter for fracture with routine healing: Secondary | ICD-10-CM | POA: Diagnosis not present

## 2023-07-30 DIAGNOSIS — S12200D Unspecified displaced fracture of third cervical vertebra, subsequent encounter for fracture with routine healing: Secondary | ICD-10-CM | POA: Diagnosis not present

## 2023-07-30 DIAGNOSIS — S12100D Unspecified displaced fracture of second cervical vertebra, subsequent encounter for fracture with routine healing: Secondary | ICD-10-CM | POA: Diagnosis not present

## 2023-07-30 MED ORDER — HYDROCODONE-ACETAMINOPHEN 5-325 MG PO TABS: 1.0000 | ORAL_TABLET | Freq: Three times a day (TID) | ORAL | 0 refills | Status: DC | PRN

## 2023-07-30 NOTE — Telephone Encounter (Signed)
 [facility] [address]

## 2023-07-31 NOTE — Telephone Encounter (Signed)
 Contacted the patient to inform of the refill being sent over to the pharmacy.

## 2023-08-02 DIAGNOSIS — I10 Essential (primary) hypertension: Secondary | ICD-10-CM | POA: Diagnosis not present

## 2023-08-02 DIAGNOSIS — E785 Hyperlipidemia, unspecified: Secondary | ICD-10-CM | POA: Diagnosis not present

## 2023-08-03 ENCOUNTER — Other Ambulatory Visit: Payer: Self-pay | Admitting: Orthopedic Surgery

## 2023-08-03 DIAGNOSIS — S12111D Posterior displaced Type II dens fracture, subsequent encounter for fracture with routine healing: Secondary | ICD-10-CM | POA: Diagnosis not present

## 2023-08-03 DIAGNOSIS — I701 Atherosclerosis of renal artery: Secondary | ICD-10-CM | POA: Diagnosis not present

## 2023-08-03 DIAGNOSIS — S12100D Unspecified displaced fracture of second cervical vertebra, subsequent encounter for fracture with routine healing: Secondary | ICD-10-CM | POA: Diagnosis not present

## 2023-08-03 DIAGNOSIS — I10 Essential (primary) hypertension: Secondary | ICD-10-CM | POA: Diagnosis not present

## 2023-08-03 MED ORDER — HYDROCODONE-ACETAMINOPHEN 5-325 MG PO TABS: 1.0000 | ORAL_TABLET | Freq: Three times a day (TID) | ORAL | 0 refills | Status: DC | PRN

## 2023-08-03 NOTE — Addendum Note (Signed)
 Addended by: HILMA HASTINGS on: 08/03/2023 12:30 PM   Modules accepted: Orders

## 2023-08-03 NOTE — Progress Notes (Signed)
 Encounter opened by mistake

## 2023-08-03 NOTE — Telephone Encounter (Signed)
 Patient called upset stating the pharmacy does not have her medication- never received refill order. Please advise

## 2023-08-03 NOTE — Telephone Encounter (Signed)
 She is being treated for type II dens fracture.   Looks like hydrocodone  script from 7/28 printed.   I have sent this to her pharmacy. PMP reviewed and is appropriate.   Please let her know.

## 2023-08-06 NOTE — Telephone Encounter (Signed)
 Patient notified

## 2023-08-07 ENCOUNTER — Ambulatory Visit: Payer: Self-pay | Admitting: Neurosurgery

## 2023-08-07 DIAGNOSIS — I701 Atherosclerosis of renal artery: Secondary | ICD-10-CM | POA: Diagnosis not present

## 2023-08-08 ENCOUNTER — Other Ambulatory Visit: Payer: Self-pay | Admitting: Physician Assistant

## 2023-08-08 ENCOUNTER — Other Ambulatory Visit: Payer: Self-pay | Admitting: Family Medicine

## 2023-08-08 DIAGNOSIS — S12111D Posterior displaced Type II dens fracture, subsequent encounter for fracture with routine healing: Secondary | ICD-10-CM

## 2023-08-14 DIAGNOSIS — S12111D Posterior displaced Type II dens fracture, subsequent encounter for fracture with routine healing: Secondary | ICD-10-CM | POA: Diagnosis not present

## 2023-08-14 DIAGNOSIS — I701 Atherosclerosis of renal artery: Secondary | ICD-10-CM | POA: Diagnosis not present

## 2023-08-14 DIAGNOSIS — S12100D Unspecified displaced fracture of second cervical vertebra, subsequent encounter for fracture with routine healing: Secondary | ICD-10-CM | POA: Diagnosis not present

## 2023-08-14 DIAGNOSIS — I10 Essential (primary) hypertension: Secondary | ICD-10-CM | POA: Diagnosis not present

## 2023-08-16 DIAGNOSIS — S12111D Posterior displaced Type II dens fracture, subsequent encounter for fracture with routine healing: Secondary | ICD-10-CM | POA: Diagnosis not present

## 2023-08-16 DIAGNOSIS — I10 Essential (primary) hypertension: Secondary | ICD-10-CM | POA: Diagnosis not present

## 2023-08-16 DIAGNOSIS — I701 Atherosclerosis of renal artery: Secondary | ICD-10-CM | POA: Diagnosis not present

## 2023-08-16 DIAGNOSIS — S12100D Unspecified displaced fracture of second cervical vertebra, subsequent encounter for fracture with routine healing: Secondary | ICD-10-CM | POA: Diagnosis not present

## 2023-08-20 ENCOUNTER — Ambulatory Visit
Admission: RE | Admit: 2023-08-20 | Discharge: 2023-08-20 | Disposition: A | Discharge status: Home / Self Care | Source: Ambulatory Visit | Attending: Neurosurgery | Admitting: Neurosurgery

## 2023-08-20 ENCOUNTER — Ambulatory Visit
Admission: RE | Admit: 2023-08-20 | Discharge: 2023-08-20 | Disposition: A | Discharge status: Home / Self Care | Attending: Neurosurgery | Admitting: Neurosurgery

## 2023-08-20 DIAGNOSIS — M47812 Spondylosis without myelopathy or radiculopathy, cervical region: Secondary | ICD-10-CM | POA: Diagnosis not present

## 2023-08-20 DIAGNOSIS — S12111D Posterior displaced Type II dens fracture, subsequent encounter for fracture with routine healing: Secondary | ICD-10-CM | POA: Diagnosis not present

## 2023-08-20 DIAGNOSIS — S129XXD Fracture of neck, unspecified, subsequent encounter: Secondary | ICD-10-CM | POA: Diagnosis not present

## 2023-08-27 ENCOUNTER — Other Ambulatory Visit: Payer: Self-pay | Admitting: Physician Assistant

## 2023-08-27 ENCOUNTER — Ambulatory Visit: Admitting: Neurosurgery

## 2023-08-27 ENCOUNTER — Telehealth: Payer: Self-pay | Admitting: Neurosurgery

## 2023-08-27 VITALS — BP 130/62 | Ht 62.0 in | Wt 114.1 lb

## 2023-08-27 DIAGNOSIS — S12111D Posterior displaced Type II dens fracture, subsequent encounter for fracture with routine healing: Secondary | ICD-10-CM

## 2023-08-27 DIAGNOSIS — S12110D Anterior displaced Type II dens fracture, subsequent encounter for fracture with routine healing: Secondary | ICD-10-CM | POA: Diagnosis not present

## 2023-08-27 MED ORDER — HYDROCODONE-ACETAMINOPHEN 5-325 MG PO TABS: 1.0000 | ORAL_TABLET | Freq: Three times a day (TID) | ORAL | 0 refills | Status: DC | PRN

## 2023-08-27 NOTE — Telephone Encounter (Signed)
 Patient called and states she forgot to ask about a refill of her Oxycodone  at her appointment this morning. [facility] [address]

## 2023-08-27 NOTE — Progress Notes (Signed)
   HISTORY OF PRESENT ILLNESS: 08/27/2023 [name] is here to follow-up after conservative management for a type II dens fracture.  After our surgical consultation they wanted to continue with conservative care to see whether or not she would be able to heal on her own.    She is being seen today to follow-up on her images.  Overall they appear stable.  There has not been a formal read.  She is not having any new neurologic issues.  She does state that her numbness and tingling has improved.  Her neck pain is improved overall she is having improved mobility.  She continues to wear her collar.  PHYSICAL EXAMINATION:   Vitals:   08/27/23 0947  BP: 130/62    General: Patient is well developed, well nourished, calm, collected, and in no apparent distress.  NEUROLOGICAL:  General: In no acute distress.  Awake, alert, oriented to person, place, and time. Pupils equal round and reactive to light.   Strength: Continues to have full strength proximally and distally, no new motor deficits.  Patient is ambulating on her own.  She appears to be moving more smoothly and appears to be stronger.  Was able to get up without any difficulty.  When she was last seen in clinic was using her arms more.  ROS (Neurologic): Negative except as noted above  IMAGING: Reviewed her images from today, no major changes.  Appears to have a type II dens fracture.  No major changes from her previous imaging.  Awaiting final read.  ASSESSMENT/PLAN:  Amber Brooks is continuing to heal from a traumatic C2 dens fracture.  We have been treating her with conservative measures in order to give her a chance to avoid any surgical fixation.  She has had multiple stable x-rays.  At this point we like to get a CT scan to evaluate for any stable nonunion development.  If this appears to be the case we will plan on doing cervical flexion-extension x-rays to evaluate for stability.  CT scan has been ordered.  Overall her  neurologic exam remains stable, she continues to have improving mobility strength and decreasing pain.  Penne MICAEL Sharps, MD/MS Department of Neurosurgery

## 2023-08-27 NOTE — Telephone Encounter (Signed)
 Patient notified and expressed understanding.

## 2023-08-30 ENCOUNTER — Ambulatory Visit
Admission: RE | Admit: 2023-08-30 | Discharge: 2023-08-30 | Disposition: A | Discharge status: Home / Self Care | Source: Ambulatory Visit | Attending: Neurosurgery | Admitting: Neurosurgery

## 2023-08-30 DIAGNOSIS — M47812 Spondylosis without myelopathy or radiculopathy, cervical region: Secondary | ICD-10-CM | POA: Diagnosis not present

## 2023-08-30 DIAGNOSIS — S12100D Unspecified displaced fracture of second cervical vertebra, subsequent encounter for fracture with routine healing: Secondary | ICD-10-CM | POA: Diagnosis not present

## 2023-08-30 DIAGNOSIS — M4312 Spondylolisthesis, cervical region: Secondary | ICD-10-CM | POA: Diagnosis not present

## 2023-08-30 DIAGNOSIS — S12111D Posterior displaced Type II dens fracture, subsequent encounter for fracture with routine healing: Secondary | ICD-10-CM | POA: Insufficient documentation

## 2023-08-31 ENCOUNTER — Ambulatory Visit: Payer: Self-pay | Admitting: Neurosurgery

## 2023-09-10 ENCOUNTER — Ambulatory Visit
Admission: RE | Admit: 2023-09-10 | Discharge: 2023-09-10 | Disposition: A | Discharge status: Home / Self Care | Source: Ambulatory Visit | Attending: Physician Assistant | Admitting: Physician Assistant

## 2023-09-10 ENCOUNTER — Ambulatory Visit
Admission: RE | Admit: 2023-09-10 | Discharge: 2023-09-10 | Disposition: A | Discharge status: Home / Self Care | Attending: Physician Assistant | Admitting: Physician Assistant

## 2023-09-10 ENCOUNTER — Other Ambulatory Visit: Payer: Self-pay

## 2023-09-10 DIAGNOSIS — S12111D Posterior displaced Type II dens fracture, subsequent encounter for fracture with routine healing: Secondary | ICD-10-CM

## 2023-09-10 DIAGNOSIS — S12100D Unspecified displaced fracture of second cervical vertebra, subsequent encounter for fracture with routine healing: Secondary | ICD-10-CM | POA: Diagnosis not present

## 2023-09-12 ENCOUNTER — Telehealth: Payer: Self-pay

## 2023-09-12 NOTE — Telephone Encounter (Signed)
 Copied from CRM [id number]. Topic: Clinical - Medical Advice >> Sep 12, 2023  8:34 AM Anairis L wrote: Reason for CRM: Pt has a UTI and is feeling faith and weak, would like to be seen ASAP. BP 100/40, but app are out until Nove= please advise.

## 2023-09-14 ENCOUNTER — Ambulatory Visit: Admitting: Nurse Practitioner

## 2023-09-14 VITALS — BP 120/66 | HR 74 | Temp 97.7°F | Ht 62.0 in | Wt 112.4 lb

## 2023-09-14 DIAGNOSIS — R300 Dysuria: Secondary | ICD-10-CM

## 2023-09-14 DIAGNOSIS — R531 Weakness: Secondary | ICD-10-CM

## 2023-09-14 LAB — CBC WITH DIFFERENTIAL/PLATELET
Basophils Absolute: 0.1 K/uL (ref 0.0–0.1)
Basophils Relative: 0.5 % (ref 0.0–3.0)
Eosinophils Absolute: 0.1 K/uL (ref 0.0–0.7)
Eosinophils Relative: 1 % (ref 0.0–5.0)
HCT: 39.6 % (ref 36.0–46.0)
Hemoglobin: 13 g/dL (ref 12.0–15.0)
Lymphocytes Relative: 10.6 % — ABNORMAL LOW (ref 12.0–46.0)
Lymphs Abs: 1.2 K/uL (ref 0.7–4.0)
MCHC: 32.8 g/dL (ref 30.0–36.0)
MCV: 88.3 fl (ref 78.0–100.0)
Monocytes Absolute: 0.6 K/uL (ref 0.1–1.0)
Monocytes Relative: 5 % (ref 3.0–12.0)
Neutro Abs: 9.5 K/uL — ABNORMAL HIGH (ref 1.4–7.7)
Neutrophils Relative %: 82.9 % — ABNORMAL HIGH (ref 43.0–77.0)
Platelets: 254 K/uL (ref 150.0–400.0)
RBC: 4.49 Mil/uL (ref 3.87–5.11)
RDW: 13.4 % (ref 11.5–15.5)
WBC: 11.5 K/uL — ABNORMAL HIGH (ref 4.0–10.5)

## 2023-09-14 LAB — COMPREHENSIVE METABOLIC PANEL WITH GFR
ALT: 18 U/L (ref 0–35)
AST: 21 U/L (ref 0–37)
Albumin: 4.8 g/dL (ref 3.5–5.2)
Alkaline Phosphatase: 54 U/L (ref 39–117)
BUN: 15 mg/dL (ref 6–23)
CO2: 27 meq/L (ref 19–32)
Calcium: 9.8 mg/dL (ref 8.4–10.5)
Chloride: 99 meq/L (ref 96–112)
Creatinine, Ser: 1.17 mg/dL (ref 0.40–1.20)
GFR: 45.08 mL/min — ABNORMAL LOW (ref >60.00)
Glucose, Bld: 87 mg/dL (ref 70–99)
Potassium: 4.3 meq/L (ref 3.5–5.1)
Sodium: 138 meq/L (ref 135–145)
Total Bilirubin: 0.6 mg/dL (ref 0.2–1.2)
Total Protein: 7.2 g/dL (ref 6.0–8.3)

## 2023-09-14 LAB — HEMOGLOBIN A1C: Hgb A1c MFr Bld: 5.5 % (ref 4.6–6.5)

## 2023-09-14 MED ORDER — NITROFURANTOIN MONOHYD MACRO 100 MG PO CAPS: 100.0000 mg | ORAL_CAPSULE | Freq: Two times a day (BID) | ORAL | 0 refills | Status: DC

## 2023-09-14 NOTE — Progress Notes (Signed)
 Leron Glance, NP-C Phone: [phone]  Amber Brooks is a 78 y.o. female who presents today for UTI symptoms.   Discussed the use of AI scribe software for clinical note transcription with the patient, who gave verbal consent to proceed.  History of Present Illness   Amber Brooks is a 78 year old female who presents with dizziness and weakness.  She experiences dizziness and a lack of energy, feeling as though she might pass out after minimal exertion. Her blood pressure sometimes drops during these episodes, with a specific instance noted as approximately 'one something over forty or fifty.' Despite efforts to regain strength through walking, she had to stop due to recurrent dizziness, particularly during a walk on Monday. She has been consuming a lot of water but continues to feel weak and dizzy.  Two days ago, she felt that she had a urinary tract infection, experiencing burning during urination, increased frequency, and urgency. No hematuria, fever, chills, abdominal pain, back pain, or pressure. She used AZO for symptom relief, which was effective after hydration, but did not take it last night.  She recalls a previous hospital visit where she was given a potassium supplement due to hypokalemia and was informed of elevated white blood cell and glucose levels. Her symptoms of weakness and dizziness have persisted since recovering from a prior condition.  She has a history of neck issues, reporting pain when carrying objects and limited neck movement. She has been off work for thirteen weeks to allow for potential healing of her neck. She has not been driving due to this issue.      Social History   Tobacco Use  Smoking Status Never  Smokeless Tobacco Never    Current Outpatient Medications on File Prior to Visit  Medication Sig Dispense Refill   amLODipine  (NORVASC ) 10 MG tablet TAKE 1 TABLET BY MOUTH DAILY 100 tablet 3   aspirin  EC 81 MG tablet Take 1 tablet (81 mg total)  by mouth daily. Swallow whole.     carvedilol  (COREG ) 25 MG tablet TAKE 1 TABLET BY MOUTH TWICE  DAILY 200 tablet 3   EPINEPHrine  0.3 mg/0.3 mL IJ SOAJ injection Inject into the muscle once as needed.     esomeprazole  (NEXIUM ) 40 MG capsule Take 1 capsule (40 mg total) by mouth 2 (two) times daily before a meal. 180 capsule 3   fluticasone  (FLONASE ) 50 MCG/ACT nasal spray Place 2 sprays into both nostrils daily. 48 mL 3   ibuprofen  (ADVIL ) 800 MG tablet TAKE 1 TABLET BY MOUTH EVERY 8  HOURS AS NEEDED FOR HEADACHE 300 tablet 2   loratadine  (CLARITIN ) 10 MG tablet TAKE 1 TABLET BY MOUTH EVERY DAY 90 tablet 1   Multiple Vitamin (MULTIVITAMIN WITH MINERALS) TABS tablet Take 1 tablet by mouth daily.     Omega 3 1000 MG CAPS Take 1,000 mg by mouth daily.     ondansetron  (ZOFRAN ) 4 MG tablet Take 1 tablet (4 mg total) by mouth every 6 (six) hours as needed for nausea. 20 tablet 0   polyethylene glycol (MIRALAX  / GLYCOLAX ) 17 g packet Take 17 g by mouth daily as needed for mild constipation. 30 each 0   rosuvastatin  (CRESTOR ) 10 MG tablet TAKE 1 TABLET BY MOUTH EVERY  NIGHT AT BEDTIME 100 tablet 3   venlafaxine  XR (EFFEXOR  XR) 75 MG 24 hr capsule Take 1 capsule (75 mg total) by mouth daily with breakfast. 100 capsule 3   promethazine  (PHENERGAN ) 12.5 MG tablet Take 1  tablet (12.5 mg total) by mouth every 6 (six) hours as needed for nausea or vomiting. 30 tablet 0   [DISCONTINUED] diphenhydrAMINE  (BENADRYL ) 25 MG tablet Take 25 mg by mouth daily as needed for allergies.     [DISCONTINUED] ezetimibe -simvastatin  (VYTORIN ) 10-40 MG per tablet Take 1 tablet by mouth at bedtime.      No current facility-administered medications on file prior to visit.     ROS see history of present illness  Objective  Physical Exam Vitals:   09/14/23 1108  BP: 120/66  Pulse: 74  Temp: 97.7 F (36.5 C)  SpO2: 98%    BP Readings from Last 3 Encounters:  09/17/23 122/68  09/14/23 120/66  08/27/23 130/62   Wt  Readings from Last 3 Encounters:  09/17/23 112 lb (50.8 kg)  09/14/23 112 lb 6.4 oz (51 kg)  08/27/23 114 lb 2 oz (51.8 kg)    Physical Exam Constitutional:      General: She is not in acute distress.    Appearance: Normal appearance.  HENT:     Head: Normocephalic.  Cardiovascular:     Rate and Rhythm: Normal rate and regular rhythm.     Heart sounds: Normal heart sounds.  Pulmonary:     Effort: Pulmonary effort is normal.     Breath sounds: Normal breath sounds.  Skin:    General: Skin is warm and dry.  Neurological:     General: No focal deficit present.     Mental Status: She is alert.  Psychiatric:        Mood and Affect: Mood normal.        Behavior: Behavior normal.     Assessment/Plan: Please see individual problem list.  Dysuria Assessment & Plan: UA with large leukocytes, positive nitrites, and trace blood in office. Patient symptomatic. Microscopic and culture pending. We will start Macrobid  twice daily x 5 days. Encourage increased fluid intake. Strict return precautions given to patient.   Orders: -     Urinalysis, Routine w reflex microscopic -     Urine Culture -     Nitrofurantoin  Monohyd Macro; Take 1 capsule (100 mg total) by mouth 2 (two) times daily.  Dispense: 10 capsule; Refill: 0  Generalized weakness Assessment & Plan: She has intermittent episodes possibly related to hypotension and dehydration, exacerbated by UTI and neck pain. Vitals stable in office. Home blood pressure readings are as low as 100/40-50. Encourage increased fluid intake and monitor blood pressure regularly. Check lab work as outlined. Strict precautions given to patient, if worsening symptoms she will seek immediate medical attention.   Orders: -     Comprehensive metabolic panel with GFR -     CBC with Differential/Platelet -     Hemoglobin A1c     Return if symptoms worsen or fail to improve.   Leron Glance, NP-C [facility]

## 2023-09-16 LAB — URINE CULTURE
MICRO NUMBER:: 16961066
SPECIMEN QUALITY:: ADEQUATE

## 2023-09-17 ENCOUNTER — Ambulatory Visit (INDEPENDENT_AMBULATORY_CARE_PROVIDER_SITE_OTHER): Admitting: Physician Assistant

## 2023-09-17 ENCOUNTER — Encounter: Payer: Self-pay | Admitting: Physician Assistant

## 2023-09-17 VITALS — BP 122/68 | Ht 62.0 in | Wt 112.0 lb

## 2023-09-17 DIAGNOSIS — S12111D Posterior displaced Type II dens fracture, subsequent encounter for fracture with routine healing: Secondary | ICD-10-CM | POA: Diagnosis not present

## 2023-09-17 NOTE — Progress Notes (Signed)
   HISTORY OF PRESENT ILLNESS: 09/17/2023 [name] is here to follow-up after conservative management for a type II dens fracture.  She is doing very well.  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  No new neurologic issues.  She has been wearing her cervical collar at all times but is eager to have it removed.   PHYSICAL EXAMINATION:   Vitals:   09/17/23 1312  BP: 122/68    General: Patient is well developed, well nourished, calm, collected, and in no apparent distress.  NEUROLOGICAL:  General: In no acute distress.  Awake, alert, oriented to person, place, and time. Pupils equal round and reactive to light.   Strength: Continues to have full strength proximally and distally, no new motor deficits.  Patient is ambulating on her own.    Slight pulling sensation with extension and flexion and moving side-to-side.  However no sharp pain.  ROS (Neurologic): Negative except as noted above  IMAGING: CT CERVICAL SPINE WITHOUT CONTRAST 08/30/2023 08:46:00 AM   TECHNIQUE: CT of the cervical spine was performed without the administration of intravenous contrast. Multiplanar reformatted images are provided for review. Automated exposure control, iterative reconstruction, and/or weight based adjustment of the mA/kV was utilized to reduce the radiation dose to as low as reasonably achievable.   COMPARISON: CT of the cervical spine dated 06/10/2023.   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 Open odontoid fracture with type II morphology, posterior displacement.   FINDINGS:   CERVICAL SPINE:   BONES AND ALIGNMENT: There has been interval partial healing of a type 2 odontoid fracture. The fracture line is now less distinct. There is slight anterolisthesis at C4-5.   DEGENERATIVE CHANGES: There is moderate bilateral facet arthrosis at C4-5, C5-6 and C6-7.   SOFT TISSUES: No prevertebral soft tissue swelling.   IMPRESSION: 1. Interval partial healing of a  type 2 dens fracture with a less distinct fracture line compared to the prior study dated 06/10/2023. 2. Slight anterolisthesis at C4-5. 3. 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   Extension and flexion x-rays of cervical spine showed no significant displacement with flexion or extension.  ASSESSMENT/PLAN:  Amber Brooks is here to follow-up after conservative management for a type II dens fracture.  She is doing very well.  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  Neurologically she is also stable.  At this time, after discussion with Dr. Claudene patient may remove cervical collar.  Advised to contact us  if she has any sharp, new pain, or neurologic deficits.  Would consider therapy in the future after we see how she is doing first 2 weeks out of her cervical collar.  Lyle Decamp, PA-C Department of Neurosurgery

## 2023-09-20 ENCOUNTER — Other Ambulatory Visit: Payer: Self-pay

## 2023-09-20 ENCOUNTER — Ambulatory Visit: Payer: Self-pay | Admitting: Nurse Practitioner

## 2023-09-20 DIAGNOSIS — R899 Unspecified abnormal finding in specimens from other organs, systems and tissues: Secondary | ICD-10-CM

## 2023-09-20 NOTE — Telephone Encounter (Signed)
 Copied from CRM [id number]. Topic: Clinical - Lab/Test Results >> Sep 20, 2023  4:22 PM [place] J wrote: Reason for CRM: Patient is calling back to clarify if Leron wanted to see her for another appointment along with a repeat of her urine culture, or if she just wanted her to get the labs completed. She would like clarification on this as she is schedule for a lab appointment next week on the 23rd. An order for the urine culture also needs to be placed.

## 2023-09-25 ENCOUNTER — Other Ambulatory Visit (INDEPENDENT_AMBULATORY_CARE_PROVIDER_SITE_OTHER)

## 2023-09-25 DIAGNOSIS — R899 Unspecified abnormal finding in specimens from other organs, systems and tissues: Secondary | ICD-10-CM | POA: Diagnosis not present

## 2023-09-25 LAB — CBC WITH DIFFERENTIAL/PLATELET
Basophils Absolute: 0.1 K/uL (ref 0.0–0.1)
Basophils Relative: 0.9 % (ref 0.0–3.0)
Eosinophils Absolute: 0.6 K/uL (ref 0.0–0.7)
Eosinophils Relative: 7.7 % — ABNORMAL HIGH (ref 0.0–5.0)
HCT: 36.5 % (ref 36.0–46.0)
Hemoglobin: 12.2 g/dL (ref 12.0–15.0)
Lymphocytes Relative: 17.8 % (ref 12.0–46.0)
Lymphs Abs: 1.4 K/uL (ref 0.7–4.0)
MCHC: 33.5 g/dL (ref 30.0–36.0)
MCV: 87.9 fl (ref 78.0–100.0)
Monocytes Absolute: 0.5 K/uL (ref 0.1–1.0)
Monocytes Relative: 6.6 % (ref 3.0–12.0)
Neutro Abs: 5.4 K/uL (ref 1.4–7.7)
Neutrophils Relative %: 67 % (ref 43.0–77.0)
Platelets: 309 K/uL (ref 150.0–400.0)
RBC: 4.16 Mil/uL (ref 3.87–5.11)
RDW: 13.4 % (ref 11.5–15.5)
WBC: 8 K/uL (ref 4.0–10.5)

## 2023-09-26 ENCOUNTER — Ambulatory Visit: Payer: Self-pay | Admitting: Nurse Practitioner

## 2023-10-01 ENCOUNTER — Telehealth: Payer: Self-pay | Admitting: Emergency Medicine

## 2023-10-01 DIAGNOSIS — R531 Weakness: Secondary | ICD-10-CM | POA: Insufficient documentation

## 2023-10-01 NOTE — Assessment & Plan Note (Addendum)
 She has intermittent episodes possibly related to hypotension and dehydration, exacerbated by UTI and neck pain. Vitals stable in office. Home blood pressure readings are as low as 100/40-50. Encourage increased fluid intake and monitor blood pressure regularly. Check lab work as outlined. Strict precautions given to patient, if worsening symptoms she will seek immediate medical attention.

## 2023-10-01 NOTE — Telephone Encounter (Signed)
 Called and spoke with the patient.  She was aware of the appointment made with Dr. Darliss for 10/03/23.  Patient advised of when to go to the emergency room and to call back for worsening symptoms as she was resistant to go to the emergency room.  Patient verbalized understanding with all questions and concerns addressed at this time.

## 2023-10-01 NOTE — Assessment & Plan Note (Signed)
 UA with large leukocytes, positive nitrites, and trace blood in office. Patient symptomatic. Microscopic and culture pending. We will start Macrobid  twice daily x 5 days. Encourage increased fluid intake. Strict return precautions given to patient.

## 2023-10-03 ENCOUNTER — Encounter: Payer: Self-pay | Admitting: Cardiology

## 2023-10-03 ENCOUNTER — Ambulatory Visit

## 2023-10-03 ENCOUNTER — Ambulatory Visit: Attending: Cardiology | Admitting: Cardiology

## 2023-10-03 VITALS — BP 118/68 | HR 75 | Ht 62.0 in | Wt 111.6 lb

## 2023-10-03 DIAGNOSIS — I25118 Atherosclerotic heart disease of native coronary artery with other forms of angina pectoris: Secondary | ICD-10-CM | POA: Diagnosis not present

## 2023-10-03 DIAGNOSIS — R002 Palpitations: Secondary | ICD-10-CM

## 2023-10-03 DIAGNOSIS — I10 Essential (primary) hypertension: Secondary | ICD-10-CM

## 2023-10-03 DIAGNOSIS — E7800 Pure hypercholesterolemia, unspecified: Secondary | ICD-10-CM | POA: Diagnosis not present

## 2023-10-03 NOTE — Patient Instructions (Signed)
 Medication Instructions:  Your physician recommends that you continue on your current medications as directed. Please refer to the Current Medication list given to you today.   *If you need a refill on your cardiac medications before your next appointment, please call your pharmacy*  Lab Work: No labs ordered today  If you have labs (blood work) drawn today and your tests are completely normal, you will receive your results only by: MyChart Message (if you have MyChart) OR A paper copy in the mail If you have any lab test that is abnormal or we need to change your treatment, we will call you to review the results.  Testing/Procedures: Your physician has requested that you have an echocardiogram. Echocardiography is a painless test that uses sound waves to create images of your heart. It provides your doctor with information about the size and shape of your heart and how well your heart's chambers and valves are working.   You may receive an ultrasound enhancing agent through an IV if needed to better visualize your heart during the echo. This procedure takes approximately one hour.  There are no restrictions for this procedure.  This will take place at [address]) [address], [place] [address]  Please note: We ask at that you not bring children with you during ultrasound (echo/ vascular) testing. Due to room size and safety concerns, children are not allowed in the ultrasound rooms during exams. Our front office staff cannot provide observation of children in our lobby area while testing is being conducted. An adult accompanying a patient to their appointment will only be allowed in the ultrasound room at the discretion of the ultrasound technician under special circumstances. We apologize for any inconvenience.   ZIO XT- Long Term Monitor Instructions  Your physician has requested you wear a ZIO patch monitor for 14 days.  This is a single patch monitor. [employer]  supplies one patch monitor per enrollment. Additional stickers are not available. Please do not apply patch if you will be having a Nuclear Stress Test, Echocardiogram, Cardiac CT, MRI, or Chest Xray during the period you would be wearing the monitor. The patch cannot be worn during these tests. You cannot remove and re-apply the ZIO XT patch monitor.  Your ZIO patch monitor will be mailed 3 day USPS to your address on file. It may take 3-5 days to receive your monitor after you have been enrolled. Once you have received your monitor, please review the enclosed instructions. Your monitor has already been registered assigning a specific monitor serial number to you.  Billing and Patient Assistance Program Information  We have supplied [employer] with any of your insurance information on file for billing purposes.  [employer] offers a sliding scale Patient Assistance Program for patients that do not have insurance, or whose insurance does not completely cover the cost of the ZIO monitor.  You must apply for the Patient Assistance Program to qualify for this discounted rate.  To apply, please call [employer] at [phone], select option 4, select option 2, ask to apply for Patient Assistance Program. Meredeth will ask your household income, and how many people are in your household. They will quote your out-of-pocket cost based on that information. [employer] will also be able to set up a 69-month, interest-free payment plan if needed.  Applying the monitor   Shave hair from upper left chest.  Hold abrader disc by orange tab. Rub abrader in 40 strokes over the upper left chest as indicated  in your monitor instructions.  Clean area with 4 enclosed alcohol pads. Let dry.  Apply patch as indicated in monitor instructions. Patch will be placed under collarbone on left side of chest with arrow pointing upward.  Rub patch adhesive wings for 2 minutes. Remove white label marked 1. Remove the white label marked 2. Rub  patch adhesive wings for 2 additional minutes.  While looking in a mirror, press and release button in center of patch. A small green light will flash 3-4 times. This will be your only indicator that the monitor has been turned on.  Do not shower for the first 24 hours. You may shower after the first 24 hours.  Press the button if you feel a symptom. You will hear a small click. Record Date, Time and Symptom in the Patient Logbook.  When you are ready to remove the patch, follow instructions on the last 2 pages of Patient Logbook.  Stick patch monitor into the tabs at the bottom of the return box.  Place Patient Logbook in the blue and white box. Use locking tab on box and tape box closed securely. The blue and white box has prepaid postage on it. Please place it in the mailbox as soon as possible. Your physician should have your test results approximately 7-14 days after the monitor has been mailed back to [facility].  Call [facility] Customer Care at [phone] if you have questions regarding your ZIO XT patch monitor.  Call them immediately if you see an orange light blinking on your monitor.  If your monitor falls off in less than 4 days, contact our Monitor department at [phone].  If your monitor becomes loose or falls off after 4 days call [employer] at [phone] for suggestions on securing your monitor.   Follow-Up: At [facility], you and your health needs are our priority.  As part of our continuing mission to provide you with exceptional heart care, our providers are all part of one team.  This team includes your primary Cardiologist (physician) and Advanced Practice Providers or APPs (Physician Assistants and Nurse Practitioners) who all work together to provide you with the care you need, when you need it.  Your next appointment:   3 month(s)  Provider:   You may see Redell Cave, MD  We recommend signing up for the patient portal called MyChart.   Sign up information is provided on this After Visit Summary.  MyChart is used to connect with patients for Virtual Visits (Telemedicine).  Patients are able to view lab/test results, encounter notes, upcoming appointments, etc.  Non-urgent messages can be sent to your provider as well.   To learn more about what you can do with MyChart, go to [email or web address].

## 2023-10-03 NOTE — Progress Notes (Signed)
 Cardiology Office Note:    Date:  10/03/2023   ID:  Amber Brooks, DOB 17-Oct-1945, MRN [id number]  PCP:  Gretel App, NP  [facility] Cardiologist:  Redell Cave, MD  [facility] Electrophysiologist:  None   Referring MD: Gretel App, NP   Chief Complaint  Patient presents with   Follow-up     6 month follow up pt complaints of chest pain, chest pressure or SOB, medciation reviewed verbally with patient    History of Present Illness:    Amber Brooks is a 78 y.o. female with a hx of nonobstructive CAD (mild LAD stenosis on CCTA 11/24), hypertension, hyperlipidemia, brain aneurysm status post clipping who presents for follow-up.    Complains of palpitations occurring almost daily ongoing over the past 3 to 4 weeks.  Clemens about 6 months ago, injuring her neck, needed a neck collar.  Also notes occasional nonspecific sharp chest pain, chest wall tenderness with palpation.  Denies any new medications.  BP adequately controlled at home.  Recovering from a UTI 3 weeks ago.  Was given antibiotics, states symptoms are much better.  Prior notes Coronary CT 11/24 CT showed a calcium  score of 168, mild calcified plaque mid LAD, 25 to 49% stenosis Echo 1/22 EF 55 to 60% Transthoracic echo 04/2014 showed normal systolic function, EF 60 to 65%, impaired relaxation. Lexiscan  Myoview 2016 with no evidence of ischemia.  Past Medical History:  Diagnosis Date   Allergy 1968   Latex   Anxiety    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   Brain aneurysm    Cataract 2022   Complication of anesthesia    Depression    GERD (gastroesophageal reflux disease)    Headache(784.0)    migraines   History of chicken pox    Hyperlipidemia    Hypertension    Mitral valve prolapse    PONV (postoperative nausea and vomiting)    Shingles     Past Surgical History:  Procedure Laterality Date   ABDOMINAL HYSTERECTOMY  1987   ANEURYSM COILING     APPENDECTOMY  1967   BRAIN SURGERY  2014   CHOLECYSTECTOMY   2018   COLONOSCOPY N/A 09/27/2020   Procedure: COLONOSCOPY;  Surgeon: Onita Elspeth Sharper, DO;  Location: [facility] ENDOSCOPY;  Service: Gastroenterology;  Laterality: N/A;   CRANIOTOMY Left 11/15/2012   Procedure: CRANIOTOMY INTRACRANIAL ANEURYSM FOR CAROTID;  Surgeon: Rockey LITTIE Peru, MD;  Location: [facility] NEURO ORS;  Service: Neurosurgery;  Laterality: Left;  LEFT Craniotomy for Aneurysm   ECTOPIC PREGNANCY SURGERY     ESOPHAGOGASTRODUODENOSCOPY N/A 09/27/2020   Procedure: ESOPHAGOGASTRODUODENOSCOPY (EGD);  Surgeon: Onita Elspeth Sharper, DO;  Location: [facility] ENDOSCOPY;  Service: Gastroenterology;  Laterality: N/A;   KNEE ARTHROSCOPY     LUMBAR LAMINECTOMY/DECOMPRESSION MICRODISCECTOMY N/A 04/27/2014   Procedure: LUMBAR LAMINECTOMY/DECOMPRESSION MICRODISCECTOMY LUMBAR FOUR-FIVE ;  Surgeon: Victory Gens, MD;  Location: [facility] NEURO ORS;  Service: Neurosurgery;  Laterality: N/A;   RADIOLOGY WITH ANESTHESIA N/A 10/09/2012   Procedure: RADIOLOGY WITH ANESTHESIA;  Surgeon: Thyra MARLA Nash, MD;  Location: [facility] OR;  Service: Radiology;  Laterality: N/A;   RENAL ANGIOGRAPHY N/A 03/09/2020   Procedure: RENAL ANGIOGRAPHY;  Surgeon: Jama Cordella MATSU, MD;  Location: [facility] INVASIVE CV LAB;  Service: Cardiovascular;  Laterality: N/A;   TONSILLECTOMY     VASCULAR SURGERY     stent placed    Current Medications: Current Meds  Medication Sig   amLODipine  (NORVASC ) 10 MG tablet TAKE 1 TABLET BY MOUTH DAILY   aspirin  EC 81  MG tablet Take 1 tablet (81 mg total) by mouth daily. Swallow whole.   carvedilol  (COREG ) 25 MG tablet TAKE 1 TABLET BY MOUTH TWICE  DAILY   EPINEPHrine  0.3 mg/0.3 mL IJ SOAJ injection Inject into the muscle once as needed.   esomeprazole  (NEXIUM ) 40 MG capsule Take 1 capsule (40 mg total) by mouth 2 (two) times daily before a meal.   fluticasone  (FLONASE ) 50 MCG/ACT nasal spray Place 2 sprays into both nostrils daily.   ibuprofen  (ADVIL ) 800 MG tablet TAKE 1 TABLET BY MOUTH EVERY 8  HOURS AS  NEEDED FOR HEADACHE   loratadine  (CLARITIN ) 10 MG tablet TAKE 1 TABLET BY MOUTH EVERY DAY   Multiple Vitamin (MULTIVITAMIN WITH MINERALS) TABS tablet Take 1 tablet by mouth daily.   Omega 3 1000 MG CAPS Take 1,000 mg by mouth daily.   rosuvastatin  (CRESTOR ) 10 MG tablet TAKE 1 TABLET BY MOUTH EVERY  NIGHT AT BEDTIME   venlafaxine  XR (EFFEXOR  XR) 75 MG 24 hr capsule Take 1 capsule (75 mg total) by mouth daily with breakfast.     Allergies:   Hydromorphone , Latex, Atorvastatin, Clonidine , Clonidine  derivatives, Lisinopril, and Silenor [doxepin]   Social History   Socioeconomic History   Marital status: Divorced    Spouse name: Not on file   Number of children: 1   Years of education: College   Highest education level: Associate degree: occupational, Scientist, product/process development, or vocational program  Occupational History   Occupation: [employer]  Tobacco Use   Smoking status: Never   Smokeless tobacco: Never  Vaping Use   Vaping status: Never Used  Substance and Sexual Activity   Alcohol use: Yes    Alcohol/week: 1.0 standard drink of alcohol    Types: 1 Glasses of wine per week    Comment: every evening   Drug use: No   Sexual activity: Not Currently  Other Topics Concern   Not on file  Social History Narrative   Right handed.  Caffeine decaf 1 cup in am.  Divorced.  1 adopted daughter.  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    Social Drivers of Corporate investment banker Strain: Low Risk  (09/13/2023)   Overall Financial Resource Strain (CARDIA)    Difficulty of Paying Living Expenses: Not hard at all  Food Insecurity: No Food Insecurity (09/13/2023)   Hunger Vital Sign    Worried About Running Out of Food in the Last Year: Never true    Ran Out of Food in the Last Year: Never true  Transportation Needs: No Transportation Needs (09/13/2023)   PRAPARE - Administrator, Civil Service (Medical): No    Lack of Transportation (Non-Medical): No  Physical Activity: Sufficiently Active  (09/13/2023)   Exercise Vital Sign    Days of Exercise per Week: 2 days    Minutes of Exercise per Session: 120 min  Stress: No Stress Concern Present (09/13/2023)   [employer] of Occupational Health - Occupational Stress Questionnaire    Feeling of Stress: Only a little  Social Connections: Moderately Integrated (09/13/2023)   Social Connection and Isolation Panel    Frequency of Communication with Friends and Family: Three times a week    Frequency of Social Gatherings with Friends and Family: Once a week    Attends Religious Services: More than 4 times per year    Active Member of [employer] or Organizations: Yes    Attends Banker Meetings: More than 4 times per year    Marital Status:  Divorced     Family History: The patient's family history includes Arthritis in her father; Breast cancer in her cousin; Dementia in her brother and father; Depression in her maternal grandfather; Heart disease in her father; Hypertension in her father; Stroke in her father; Vision loss in her mother.  ROS:   Please see the history of present illness.     All other systems reviewed and are negative.  EKGs/Labs/Other Studies Reviewed:    The following studies were reviewed today:   EKG:  EKG not ordered today.   Recent Labs: 03/16/2023: TSH 0.95 09/14/2023: ALT 18; BUN 15; Creatinine, Ser 1.17; Potassium 4.3; Sodium 138 09/25/2023: Hemoglobin 12.2; Platelets 309.0  Recent Lipid Panel    Component Value Date/Time   CHOL 178 03/16/2023 1409   TRIG 110 03/16/2023 1409   HDL 84 03/16/2023 1409   CHOLHDL 2.1 03/16/2023 1409   VLDL 33 04/07/2014 0425   LDLCALC 74 03/16/2023 1409     Risk Assessment/Calculations:      Physical Exam:    VS:  BP 118/68 (BP Location: Left Arm, Patient Position: Sitting, Cuff Size: Normal)   Pulse 75   Ht 5' 2 (1.575 m)   Wt 111 lb 9.6 oz (50.6 kg)   SpO2 99%   BMI 20.41 kg/m     Wt Readings from Last 3 Encounters:  10/03/23 111 lb 9.6 oz  (50.6 kg)  09/17/23 112 lb (50.8 kg)  09/14/23 112 lb 6.4 oz (51 kg)     GEN:  Well nourished, well developed in no acute distress HEENT: Normal NECK: No JVD; No carotid bruits CARDIAC: RRR, no murmurs, rubs, gallops RESPIRATORY:  Clear to auscultation without rales, wheezing or rhonchi  ABDOMEN: Soft, non-tender, non-distended MUSCULOSKELETAL:  No edema; chest wall tenderness with palpation SKIN: Warm and dry NEUROLOGIC:  Alert and oriented x 3 PSYCHIATRIC:  Normal affect   ASSESSMENT:    1. Palpitations   2. Coronary artery disease involving native coronary artery of native heart with other form of angina pectoris   3. Primary hypertension   4. Pure hypercholesterolemia    PLAN:    In order of problems listed above:  Palpitations, order cardiac monitor to evaluate any significant arrhythmias.  Reassure patient if monitor is unrevealing. Nonobstructive CAD, mild stenosis in proximal LAD CCTA 11/24.  Chest pain reproducible with palpation indicating musculoskeletal etiology.  Continue aspirin  81 mg, Crestor  10 mg daily.  LDL reasonably controlled at 74.  Repeat echo. Hypertension, BP controlled.  Continue Coreg  25 mg twice daily, Norvasc  10 mg daily, Hyperlipidemia, cholesterol controlled continue Crestor  10 mg daily.    Follow-up in 3 months  Medication Adjustments/Labs and Tests Ordered: Current medicines are reviewed at length with the patient today.  Concerns regarding medicines are outlined above.  Orders Placed This Encounter  Procedures   LONG TERM MONITOR (3-14 DAYS)   EKG 12-Lead   ECHOCARDIOGRAM COMPLETE   No orders of the defined types were placed in this encounter.   Patient Instructions  Medication Instructions:  Your physician recommends that you continue on your current medications as directed. Please refer to the Current Medication list given to you today.   *If you need a refill on your cardiac medications before your next appointment, please call your  pharmacy*  Lab Work: No labs ordered today  If you have labs (blood work) drawn today and your tests are completely normal, you will receive your results only by: MyChart Message (if you have MyChart) OR A  paper copy in the mail If you have any lab test that is abnormal or we need to change your treatment, we will call you to review the results.  Testing/Procedures: Your physician has requested that you have an echocardiogram. Echocardiography is a painless test that uses sound waves to create images of your heart. It provides your doctor with information about the size and shape of your heart and how well your heart's chambers and valves are working.   You may receive an ultrasound enhancing agent through an IV if needed to better visualize your heart during the echo. This procedure takes approximately one hour.  There are no restrictions for this procedure.  This will take place at [address] [facility] [address] [facility]) [address], [place] [address]  Please note: We ask at that you not bring children with you during ultrasound (echo/ vascular) testing. Due to room size and safety concerns, children are not allowed in the ultrasound rooms during exams. Our front office staff cannot provide observation of children in our lobby area while testing is being conducted. An adult accompanying a patient to their appointment will only be allowed in the ultrasound room at the discretion of the ultrasound technician under special circumstances. We apologize for any inconvenience.   ZIO XT- Long Term Monitor Instructions  Your physician has requested you wear a ZIO patch monitor for 14 days.  This is a single patch monitor. [employer] supplies one patch monitor per enrollment. Additional stickers are not available. Please do not apply patch if you will be having a Nuclear Stress Test, Echocardiogram, Cardiac CT, MRI, or Chest Xray during the period you would be wearing the monitor. The patch cannot be worn  during these tests. You cannot remove and re-apply the ZIO XT patch monitor.  Your ZIO patch monitor will be mailed 3 day USPS to your address on file. It may take 3-5 days to receive your monitor after you have been enrolled. Once you have received your monitor, please review the enclosed instructions. Your monitor has already been registered assigning a specific monitor serial number to you.  Billing and Patient Assistance Program Information  We have supplied [employer] with any of your insurance information on file for billing purposes.  [employer] offers a sliding scale Patient Assistance Program for patients that do not have insurance, or whose insurance does not completely cover the cost of the ZIO monitor.  You must apply for the Patient Assistance Program to qualify for this discounted rate.  To apply, please call [employer] at [phone], select option 4, select option 2, ask to apply for Patient Assistance Program. Meredeth will ask your household income, and how many people are in your household. They will quote your out-of-pocket cost based on that information. [employer] will also be able to set up a 17-month, interest-free payment plan if needed.  Applying the monitor   Shave hair from upper left chest.  Hold abrader disc by orange tab. Rub abrader in 40 strokes over the upper left chest as indicated in your monitor instructions.  Clean area with 4 enclosed alcohol pads. Let dry.  Apply patch as indicated in monitor instructions. Patch will be placed under collarbone on left side of chest with arrow pointing upward.  Rub patch adhesive wings for 2 minutes. Remove white label marked 1. Remove the white label marked 2. Rub patch adhesive wings for 2 additional minutes.  While looking in a mirror, press and release button in center of patch. A small green light  will flash 3-4 times. This will be your only indicator that the monitor has been turned on.  Do not shower for the first 24 hours. You  may shower after the first 24 hours.  Press the button if you feel a symptom. You will hear a small click. Record Date, Time and Symptom in the Patient Logbook.  When you are ready to remove the patch, follow instructions on the last 2 pages of Patient Logbook.  Stick patch monitor into the tabs at the bottom of the return box.  Place Patient Logbook in the blue and white box. Use locking tab on box and tape box closed securely. The blue and white box has prepaid postage on it. Please place it in the mailbox as soon as possible. Your physician should have your test results approximately 7-14 days after the monitor has been mailed back to [facility].  Call [facility] Customer Care at [phone] if you have questions regarding your ZIO XT patch monitor.  Call them immediately if you see an orange light blinking on your monitor.  If your monitor falls off in less than 4 days, contact our Monitor department at [phone].  If your monitor becomes loose or falls off after 4 days call [employer] at [phone] for suggestions on securing your monitor.   Follow-Up: At [facility], you and your health needs are our priority.  As part of our continuing mission to provide you with exceptional heart care, our providers are all part of one team.  This team includes your primary Cardiologist (physician) and Advanced Practice Providers or APPs (Physician Assistants and Nurse Practitioners) who all work together to provide you with the care you need, when you need it.  Your next appointment:   3 month(s)  Provider:   You may see Redell Cave, MD  We recommend signing up for the patient portal called MyChart.  Sign up information is provided on this After Visit Summary.  MyChart is used to connect with patients for Virtual Visits (Telemedicine).  Patients are able to view lab/test results, encounter notes, upcoming appointments, etc.  Non-urgent messages can be sent to your provider  as well.   To learn more about what you can do with MyChart, go to [email or web address].             Signed, Redell Cave, MD  10/03/2023 12:35 PM    [facility]

## 2023-10-12 ENCOUNTER — Telehealth: Payer: Self-pay | Admitting: Physician Assistant

## 2023-10-12 ENCOUNTER — Other Ambulatory Visit: Payer: Self-pay | Admitting: Nurse Practitioner

## 2023-10-12 DIAGNOSIS — J301 Allergic rhinitis due to pollen: Secondary | ICD-10-CM

## 2023-10-12 NOTE — Telephone Encounter (Signed)
 Pt is calling in to inform that she is having neck pain still when she moves her neck down, left, and back & wanted to discuss therapy opts based on your note: Would consider therapy in the future after we see how she is doing first 2 weeks out of her cervical collar. And she also wanted to know if you recommend anything else or would you like her to come in to be seen?

## 2023-10-16 ENCOUNTER — Ambulatory Visit (INDEPENDENT_AMBULATORY_CARE_PROVIDER_SITE_OTHER): Admitting: Physician Assistant

## 2023-10-16 DIAGNOSIS — S12110D Anterior displaced Type II dens fracture, subsequent encounter for fracture with routine healing: Secondary | ICD-10-CM

## 2023-10-16 DIAGNOSIS — S12111D Posterior displaced Type II dens fracture, subsequent encounter for fracture with routine healing: Secondary | ICD-10-CM

## 2023-10-16 NOTE — Progress Notes (Signed)
 Patient underwent conservative management for type II dens fracture.  She is now out of her cervical collar.  She calls today with some concerns for continued pain when she extends her head back and to the left.  This causes some shooting pain from the back of her neck up to her to the back of her head.  This is not new pain and is not more severe in nature.  She denies any changes to her gait, weakness, numbness or tingling.  She expresses interest in physical therapy to help strengthen her neck muscles that were compromised by being in a cervical collar for a long period of time.  I think this is reasonable.  Would like to confirm with x-ray that there is no new change in the stability of her spine.  Order has been placed for those.  Will review once complete and PT order will be sent.  Would like patient to hold undergoing physical therapy until final cervical x-ray read.   This visit was performed via telephone.  Patient location: home Provider location: office  I spent a total of 8 minutes non-face-to-face activities for this visit on the date of this encounter including review of current clinical condition and response to treatment.  The patient is aware of and accepts the limits of this telehealth visit.

## 2023-10-17 ENCOUNTER — Ambulatory Visit
Admission: RE | Admit: 2023-10-17 | Discharge: 2023-10-17 | Disposition: A | Discharge status: Home / Self Care | Attending: Physician Assistant | Admitting: Physician Assistant

## 2023-10-17 ENCOUNTER — Ambulatory Visit
Admission: RE | Admit: 2023-10-17 | Discharge: 2023-10-17 | Disposition: A | Source: Ambulatory Visit | Attending: Physician Assistant | Admitting: Physician Assistant

## 2023-10-17 DIAGNOSIS — M47812 Spondylosis without myelopathy or radiculopathy, cervical region: Secondary | ICD-10-CM | POA: Diagnosis not present

## 2023-10-17 DIAGNOSIS — S12111D Posterior displaced Type II dens fracture, subsequent encounter for fracture with routine healing: Secondary | ICD-10-CM | POA: Insufficient documentation

## 2023-10-17 DIAGNOSIS — M542 Cervicalgia: Secondary | ICD-10-CM | POA: Diagnosis not present

## 2023-10-17 DIAGNOSIS — M8580 Other specified disorders of bone density and structure, unspecified site: Secondary | ICD-10-CM | POA: Diagnosis not present

## 2023-10-19 ENCOUNTER — Ambulatory Visit: Admitting: Nurse Practitioner

## 2023-10-19 ENCOUNTER — Telehealth: Payer: Self-pay | Admitting: Physician Assistant

## 2023-10-19 NOTE — Telephone Encounter (Signed)
 Pt called in and informed that Amber Brooks financial will be reaching out via fax for you to fill out paperwork. Pt also would like a call back on Monday from you.

## 2023-10-22 NOTE — Telephone Encounter (Signed)
 Ulis Bottcher, PA-C  [place], [place] D Heads up patient wants to extend her disability by another 2 weeks.   OK to extend per Bottcher Ulis, PA-C

## 2023-10-23 NOTE — Telephone Encounter (Signed)
 Spoke with patient- information that was faxed was not legible, resending over

## 2023-10-26 DIAGNOSIS — R002 Palpitations: Secondary | ICD-10-CM | POA: Diagnosis not present

## 2023-10-30 ENCOUNTER — Ambulatory Visit: Payer: Self-pay | Admitting: Cardiology

## 2023-10-30 DIAGNOSIS — R002 Palpitations: Secondary | ICD-10-CM | POA: Diagnosis not present

## 2023-10-30 NOTE — Telephone Encounter (Signed)
 Forms have been completed.

## 2023-10-31 ENCOUNTER — Other Ambulatory Visit: Payer: Self-pay

## 2023-10-31 MED ORDER — DILTIAZEM HCL ER COATED BEADS 240 MG PO CP24
240.0000 mg | ORAL_CAPSULE | Freq: Every day | ORAL | 3 refills | Status: AC
Start: 2023-10-31 — End: 2024-01-29

## 2023-11-06 ENCOUNTER — Ambulatory Visit: Payer: Self-pay

## 2023-11-06 NOTE — Telephone Encounter (Signed)
 FYI Only or Action Required?: FYI only for provider: appointment scheduled on 11/07/2023.  Patient was last seen in primary care on 09/14/2023 by Gretel App, NP.  Called Nurse Triage reporting Urinary Frequency.  Symptoms began today.  Interventions attempted: OTC medications: AZO.  Symptoms are: gradually worsening.  Triage Disposition: See Physician Within 24 Hours  Patient/caregiver understands and will follow disposition?: Yes       Copied from CRM [id number]. Topic: Clinical - Red Word Triage >> Nov 06, 2023  4:18 PM Viola FALCON wrote: Red Word that prompted transfer to Nurse Triage: Patient having burning and frequent urination - believes she has an UTI Reason for Disposition  Age > 50 years  Answer Assessment - Initial Assessment Questions Currently taking AZO for symptoms.   1. SEVERITY: How bad is the pain?  (e.g., Scale 1-10; mild, moderate, or severe)     Becoming severe  2. FREQUENCY: How many times have you had painful urination today?      About 20  3. PATTERN: Is pain present every time you urinate or just sometimes?      Every time 4. ONSET: When did the painful urination start?      Today  5. FEVER: Do you have a fever? If Yes, ask: What is your temperature, how was it measured, and when did it start?     Denies  6. PAST UTI: Have you had a urine infection before? If Yes, ask: When was the last time? and What happened that time?      Yes 7. CAUSE: What do you think is causing the painful urination?  (e.g., UTI, scratch, Herpes sore)     UTI  Protocols used: Urination Pain - Female-A-AH

## 2023-11-07 ENCOUNTER — Ambulatory Visit: Admitting: Family Medicine

## 2023-11-07 ENCOUNTER — Encounter: Payer: Self-pay | Admitting: Family Medicine

## 2023-11-07 VITALS — BP 145/50 | HR 57 | Resp 14 | Ht 62.0 in | Wt 112.5 lb

## 2023-11-07 DIAGNOSIS — R944 Abnormal results of kidney function studies: Secondary | ICD-10-CM

## 2023-11-07 DIAGNOSIS — R300 Dysuria: Secondary | ICD-10-CM | POA: Diagnosis not present

## 2023-11-07 DIAGNOSIS — N390 Urinary tract infection, site not specified: Secondary | ICD-10-CM

## 2023-11-07 MED ORDER — SULFAMETHOXAZOLE-TRIMETHOPRIM 800-160 MG PO TABS: 1.0000 | ORAL_TABLET | Freq: Two times a day (BID) | ORAL | 0 refills | Status: AC

## 2023-11-07 NOTE — Progress Notes (Signed)
 Acute Office Visit  Introduced to nurse practitioner role and practice setting.  All questions answered.  Discussed provider/patient relationship and expectations.   Subjective:     Patient ID: Amber Brooks, female    DOB: July 21, 1945, 78 y.o.   MRN: [id number]  Chief Complaint  Patient presents with   Dysuria    Pain with urination onset yesterday otc: azo   Dehydration    Pt states always feels dehydrated. Would like blood work done last one in September were abnormal and wants to have them repeated.    Discussed the use of AI scribe software for clinical note transcription with the patient, who gave verbal consent to proceed.  History of Present Illness Amber Brooks is a 78 year old female with recurrent urinary tract infections who presents with dysuria and increased urinary frequency.  She began experiencing dysuria and increased urinary frequency yesterday morning. No urinary incontinence, vaginal discharge, or vaginal pain. She has a history of recurrent urinary tract infections, approximately six times a year, which have increased since a neck fracture. Macrobid  has been effective in the past for symptom resolution - prescribed in September.  She experienced diarrhea on Sunday, which she believes may have contributed to her current symptoms. No back pain is reported.  She has concerns about her kidney function due to previous abnormal lab results..  She has been drinking a lot of water recently to alleviate her symptoms and mentions a recent weight of 109 pounds at home, with a goal to return to 114-115 pounds. She has been consuming protein shakes to help regain weight lost after her neck injury.   Dysuria  This is a new problem. The current episode started yesterday. The problem occurs every urination. The problem has been gradually worsening. The quality of the pain is described as burning. The pain is moderate. There has been no fever. She is Not sexually active.  There is No history of pyelonephritis. Associated symptoms include frequency, hesitancy and urgency. Pertinent negatives include no chills, discharge, flank pain, hematuria, nausea, possible pregnancy, sweats or vomiting. She has tried home medications (azo) for the symptoms. The treatment provided mild relief. Her past medical history is significant for recurrent UTIs.    Review of Systems  Constitutional:  Negative for chills.  Gastrointestinal:  Negative for nausea and vomiting.  Genitourinary:  Positive for dysuria, frequency, hesitancy and urgency. Negative for flank pain and hematuria.        Objective:    BP (!) 145/50   Pulse (!) 57   Resp 14   Ht 5' 2 (1.575 m)   Wt 112 lb 8 oz (51 kg)   SpO2 100%   BMI 20.58 kg/m    Physical Exam Constitutional:      General: She is not in acute distress.    Appearance: Normal appearance. She is normal weight. She is not ill-appearing, toxic-appearing or diaphoretic.  HENT:     Head: Normocephalic.  Eyes:     Extraocular Movements: Extraocular movements intact.     Pupils: Pupils are equal, round, and reactive to light.  Cardiovascular:     Rate and Rhythm: Normal rate and regular rhythm.     Pulses: Normal pulses.     Heart sounds: Normal heart sounds. No murmur heard.    No friction rub. No gallop.  Pulmonary:     Effort: Pulmonary effort is normal. No respiratory distress.     Breath sounds: Normal breath sounds. No stridor. No wheezing,  rhonchi or rales.  Chest:     Chest wall: No tenderness.  Abdominal:     General: Abdomen is flat. Bowel sounds are normal. There is no distension.     Palpations: Abdomen is soft. There is no mass.     Tenderness: There is no abdominal tenderness. There is no right CVA tenderness, left CVA tenderness, guarding or rebound.     Hernia: No hernia is present.  Musculoskeletal:     Right lower leg: No edema.     Left lower leg: No edema.  Skin:    General: Skin is warm and dry.      Capillary Refill: Capillary refill takes less than 2 seconds.  Neurological:     General: No focal deficit present.     Mental Status: She is alert and oriented to person, place, and time. Mental status is at baseline.  Psychiatric:        Mood and Affect: Mood normal.        Behavior: Behavior normal.        Thought Content: Thought content normal.        Judgment: Judgment normal.     No results found for any visits on 11/07/23.      Assessment & Plan:  Assessment and Plan Assessment & Plan Dysuria - Associated with frequency, urgency, and Recurrent UTIs  - states has had about six UTIs this year - attributes has gottne more frequent since neck injury and recovery - No discoloration, bleeding, or odor changes in urine.  - No vaginal discharge or pain- denies sexual activity.  - Hysterectomy in 1987 with no ovaries, cervix, or uterus, potentially contributing to recurrent UTIs due to atrophic changes - Previous treatment with Macrobid  was effective, Mid September 2025.  - given current symptoms and hx of recurrence suggestive of UTI - pt taking azo unable to perform POC UA in clinic - will send for UA w/ reflex and culture, & CBC w/ diff - Prescribed Bactrim   - may continue AZO for bladder discomfort - wipe front to back - stay hydrated, at least 50 to 60 ounces of water a day - Discussed potential referral to a urogynecologist for recurrent UTIs. - Consider in future topical estrogen cream to reduce recurrence of UTIs - may discuss with PCP  Decreased GFR - Previous abnormal kidney function studies with concerns about GFR level of 45. - Pt is concerned for dehydration and decreased protein due to weight loss since neck surgery - BMI 20.58 - Order CMP - Advised to maintain hydration with at least 50-60 ounces of water daily. - Encouraged protein intake to aid in weight gain and healing.  Problem List Items Addressed This Visit       Other   Dysuria - Primary    Relevant Medications   sulfamethoxazole -trimethoprim  (BACTRIM  DS) 800-160 MG tablet   Other Relevant Orders   Urine Culture   Urinalysis, Routine w reflex microscopic   CBC with Differential/Platelet   Other Visit Diagnoses       Recurrent UTI       Relevant Medications   sulfamethoxazole -trimethoprim  (BACTRIM  DS) 800-160 MG tablet     Decreased calculated GFR       Relevant Orders   Comprehensive metabolic panel with GFR       Meds ordered this encounter  Medications   sulfamethoxazole -trimethoprim  (BACTRIM  DS) 800-160 MG tablet    Sig: Take 1 tablet by mouth 2 (two) times daily for 7 days.  Dispense:  14 tablet    Refill:  0    No follow-ups on file.  Curtis DELENA Boom, FNP  I, Curtis DELENA Boom, FNP, have reviewed all documentation for this visit. The documentation on 11/07/23 for the exam, diagnosis, procedures, and orders are all accurate and complete.

## 2023-11-08 ENCOUNTER — Ambulatory Visit: Payer: Self-pay | Admitting: Family Medicine

## 2023-11-08 LAB — URINALYSIS, ROUTINE W REFLEX MICROSCOPIC
Glucose, UA: NEGATIVE
Ketones, UA: NEGATIVE
Nitrite, UA: POSITIVE — AB
RBC, UA: NEGATIVE
Specific Gravity, UA: 1.028 (ref 1.005–1.030)
Urobilinogen, Ur: 1 mg/dL (ref 0.2–1.0)
pH, UA: 6 (ref 5.0–7.5)

## 2023-11-08 LAB — CBC WITH DIFFERENTIAL/PLATELET
Basophils Absolute: 0.1 x10E3/uL (ref 0.0–0.2)
Basos: 1 %
EOS (ABSOLUTE): 0.1 x10E3/uL (ref 0.0–0.4)
Eos: 1 %
Hematocrit: 38 % (ref 34.0–46.6)
Hemoglobin: 12.6 g/dL (ref 11.1–15.9)
Immature Grans (Abs): 0.1 x10E3/uL (ref 0.0–0.1)
Immature Granulocytes: 1 %
Lymphocytes Absolute: 1.4 x10E3/uL (ref 0.7–3.1)
Lymphs: 14 %
MCH: 30.7 pg (ref 26.6–33.0)
MCHC: 33.2 g/dL (ref 31.5–35.7)
MCV: 93 fL (ref 79–97)
Monocytes Absolute: 0.6 x10E3/uL (ref 0.1–0.9)
Monocytes: 6 %
Neutrophils Absolute: 7.8 x10E3/uL — ABNORMAL HIGH (ref 1.4–7.0)
Neutrophils: 77 %
Platelets: 207 x10E3/uL (ref 150–450)
RBC: 4.11 x10E6/uL (ref 3.77–5.28)
RDW: 12.8 % (ref 11.7–15.4)
WBC: 10 x10E3/uL (ref 3.4–10.8)

## 2023-11-08 LAB — MICROSCOPIC EXAMINATION
Bacteria, UA: NONE SEEN
RBC, Urine: NONE SEEN /HPF (ref 0–2)

## 2023-11-08 LAB — COMPREHENSIVE METABOLIC PANEL WITH GFR
ALT: 20 IU/L (ref 0–32)
AST: 24 IU/L (ref 0–40)
Albumin: 4.7 g/dL (ref 3.8–4.8)
Alkaline Phosphatase: 69 IU/L (ref 49–135)
BUN/Creatinine Ratio: 19 (ref 12–28)
BUN: 16 mg/dL (ref 8–27)
Bilirubin Total: 0.4 mg/dL (ref 0.0–1.2)
CO2: 23 mmol/L (ref 20–29)
Calcium: 9.8 mg/dL (ref 8.7–10.3)
Chloride: 100 mmol/L (ref 96–106)
Creatinine, Ser: 0.83 mg/dL (ref 0.57–1.00)
Globulin, Total: 2.3 g/dL (ref 1.5–4.5)
Glucose: 99 mg/dL (ref 70–99)
Potassium: 4.5 mmol/L (ref 3.5–5.2)
Sodium: 139 mmol/L (ref 134–144)
Total Protein: 7 g/dL (ref 6.0–8.5)
eGFR: 73 mL/min/1.73 (ref >59)

## 2023-11-08 LAB — SPECIMEN STATUS REPORT

## 2023-11-09 LAB — URINE CULTURE: Organism ID, Bacteria: NO GROWTH

## 2023-11-09 LAB — SPECIMEN STATUS REPORT

## 2023-11-15 NOTE — Progress Notes (Signed)
   11/19/2023 10:50 AM   Amber Brooks 04/11/1945 [id number]  Reason for visit: Follow up Left renal mass   HPI: 78 y.o. female, initial follow up with me today, previously seen by Dr. 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  Laboratory Data: N/A  Pertinent Imaging: CT A/P (2023)-  IMPRESSION: 1. No significant change in the appearance of focal enhancement abnormality localizing to the inferior pole of the right kidney. Specifically, there is a area of relative hypoenhancement seen on the portal venous phase images only which measures approximately 1.3 cm on the coronal reformatted images. No corresponding area of arterial FA is enhancement noted. Further, no corresponding abnormality noted on the MRI from 09/24/2020. Although this remains indeterminate the 12 months of stability is reassuring. As is abnormality remains indeterminate, continued interval surveillance of this abnormality is recommended with repeat renal protocol CT in 12 months.    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  This does appear to be a benign renal lesion, possible infarct or prior scar.  I think a very low likelihood of underlying malignancy.    - Plan for interval RBUS.  If stable, no further surveillance imaging indicated   Recurrent UTI Assessment & Plan: She inquired about recurrent UTI  management at the end of our visit Reported 6+ UTI /year Lingering LUTS s/ recent UTI treatment   - check UA w/ reflex today - Our clinic can certainly help with workup.  I will schedule her for follow-up for a deeper dive into her history, risk factors and management strategies        Penne JONELLE Skye, MD  [facility] [address] [place], [place] [address] [phone]

## 2023-11-16 ENCOUNTER — Telehealth: Payer: Self-pay | Admitting: Physician Assistant

## 2023-11-16 NOTE — Telephone Encounter (Signed)
 Amber Brooks [phone] ext [phone] , [employer] 5:30 cst called in to confirm the paperwork date, pt let them know that the date of return was supposed to be extended & not 10/31. Please advise. Rep would like a call back to get clarification.

## 2023-11-19 ENCOUNTER — Ambulatory Visit: Admitting: Urology

## 2023-11-19 ENCOUNTER — Encounter: Payer: Self-pay | Admitting: Urology

## 2023-11-19 VITALS — BP 145/74 | HR 62 | Ht 62.0 in | Wt 109.0 lb

## 2023-11-19 DIAGNOSIS — N289 Disorder of kidney and ureter, unspecified: Secondary | ICD-10-CM | POA: Insufficient documentation

## 2023-11-19 DIAGNOSIS — N390 Urinary tract infection, site not specified: Secondary | ICD-10-CM | POA: Diagnosis not present

## 2023-11-19 LAB — URINALYSIS, COMPLETE
Bilirubin, UA: NEGATIVE
Glucose, UA: NEGATIVE
Ketones, UA: NEGATIVE
Leukocytes,UA: NEGATIVE
Nitrite, UA: NEGATIVE
Protein,UA: NEGATIVE
RBC, UA: NEGATIVE
Specific Gravity, UA: 1.02 (ref 1.005–1.030)
Urobilinogen, Ur: 0.2 mg/dL (ref 0.2–1.0)
pH, UA: 6 (ref 5.0–7.5)

## 2023-11-19 LAB — MICROSCOPIC EXAMINATION

## 2023-11-19 NOTE — Assessment & Plan Note (Signed)
 1.3 cm focal left renal lesion (indeterminate, possible scar)   I reviewed her prior workup and imaging.  This does appear to be a benign renal lesion, possible infarct or prior scar.  I think a very low likelihood of underlying malignancy.    - Plan for interval RBUS.  If stable, no further surveillance imaging indicated

## 2023-11-19 NOTE — Patient Instructions (Signed)
 Please allow Central Scheduling 1-2 weeks to reach out to you to set up your appointment. If you have not heard from them their number is [phone] to set up your Renal Ultrasound.

## 2023-11-19 NOTE — Assessment & Plan Note (Addendum)
 She inquired about recurrent UTI management at the end of our visit Reported 6+ UTI /year Lingering LUTS s/ recent UTI treatment   - check UA w/ reflex today - Our clinic can certainly help with workup.  I will schedule her for follow-up for a deeper dive into her history, risk factors and management strategies

## 2023-11-20 ENCOUNTER — Encounter: Payer: Self-pay | Admitting: Physician Assistant

## 2023-11-20 NOTE — Telephone Encounter (Signed)
 Cassius called back and said she just needs the information faxed to her so she can put in the claim. She said the best fax number is her direct one which is [phone].

## 2023-11-20 NOTE — Telephone Encounter (Signed)
 Left voicemail for [employer]

## 2023-11-20 NOTE — Telephone Encounter (Signed)
 faxed

## 2023-11-23 ENCOUNTER — Ambulatory Visit: Attending: Cardiology

## 2023-11-23 DIAGNOSIS — I25118 Atherosclerotic heart disease of native coronary artery with other forms of angina pectoris: Secondary | ICD-10-CM

## 2023-11-23 DIAGNOSIS — I10 Essential (primary) hypertension: Secondary | ICD-10-CM | POA: Diagnosis not present

## 2023-11-23 LAB — ECHOCARDIOGRAM COMPLETE
AR max vel: 2.13 cm2
AV Area VTI: 2.27 cm2
AV Area mean vel: 2.18 cm2
AV Mean grad: 5 mmHg
AV Peak grad: 8.6 mmHg
Ao pk vel: 1.47 m/s
Area-P 1/2: 2.95 cm2
S' Lateral: 2.4 cm

## 2023-11-25 NOTE — Progress Notes (Deleted)
 11/27/2023 11:37 PM   Amber Brooks Dec 27, 1945 [id number]  Referring provider: Gretel App, NP [address] [place],  [place] [address]  Urological history: 1. rUTI's - November 07, 2023 - no growth - September 14, 2023 - enterococcus faecalis - May 24, 2023 - <10,000 colonies  2. Indeterminate left renal lesion - RUS pending   No chief complaint on file.  HPI: Amber Brooks is a 78 y.o. woman who presents today for renal ultrasound report and further evaluation for presumptive rUTI's.  Previous records reviewed.  Patient states that she has had *** urinary tract infections over the last year.  Reviewing her records,  she has had *** .    Her symptoms with a urinary tract infection consist of ***.  She has/has not been on any antibiotics for the last 30 days.  She has/has not had a catheter in the last 30 days.    She does/does not have a history of nephrolithiasis, pelvic surgery or pelvic trauma. ***  She does not have any history of GU system abnormalities.  ***  She is/is not sexually active.  She has/has not noted a correlation with her urinary tract infections and sexual intercourse.  ***   She does/does not engage in anal sex. ***  She is/ is not having anal to vaginal sex.*** She is/is not voiding before and after sex. ***     She is/is not postmenopausal. ***  She is/is not *** having vaginal discharge or vaginal irritation.   She is taking/apply *** estrogen cream ***  She admits to/denies constipation, fecal incontinence  and/or diarrhea. ***  She has/does not have incontinence.  She is using incontinence pads. ***  She is having/ not having pain with bladder filling.  ***  She has/has not traveled recently. ***   She has a history of working or walking for long periods of time.  ***  She has *** when she is not having an infection.   She has/not had any recent imaging studies.  ***  She is drinking *** of water daily.     Patients with rUTIs should have a complete history obtained, including LUTS such as dysuria, frequency, urgency, nocturia, incontinence, hematuria, pneumaturia, and fecaluria. Further information to obtain includes any history of bowel symptoms such as diarrhea, accidental bowel leakage, or constipation; recent use of antibiotics for any medical condition; prior antibiotic-related problems (e.g., C. difficile infection); antibiotic allergies and sensitivities; back or flank pain; catheter usage; vaginal discharge or irritation; menopausal status; post-coital UTI; contraceptive method; and use of spermicides or estrogen- or progesterone-containing products. Details of prior urinary tract or pelvic surgery should be obtained, and patients should be queried as to travel history and history of working or walking for long periods of time. Baseline genitourinary symptoms between infections may also be illuminative, including the number of voids per day, sensation of urge to void, straining to void, a sensation of incomplete emptying, pelvic pressure or heaviness, vaginal bulge, dysuria, dyspareunia, as well as the location, character, and severity of any baseline genitourinary or pelvic pain or discomfort. UTI history includes frequency of UTI, antimicrobial usage, and documentation of positive cultures and the type of cultured microorganisms. Risk factors for complicated UTI, as previously discussed, should also be elucidated.  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   Brain  aneurysm    Cataract 2022   Complication of anesthesia    Depression    GERD (gastroesophageal reflux disease)    Headache(784.0)    migraines   History of chicken pox    Hyperlipidemia    Hypertension    Mitral valve prolapse    PONV (postoperative nausea and vomiting)    Shingles     Surgical History: Past  Surgical History:  Procedure Laterality Date   ABDOMINAL HYSTERECTOMY  1987   ANEURYSM COILING     APPENDECTOMY  1967   BRAIN SURGERY  2014   CHOLECYSTECTOMY  2018   COLONOSCOPY N/A 09/27/2020   Procedure: COLONOSCOPY;  Surgeon: Onita Elspeth Sharper, DO;  Location[facility] ENDOSCOPY;  Service: Gastroenterology;  Laterality: N/A;   CRANIOTOMY Left 11/15/2012   Procedure: CRANIOTOMY INTRACRANIAL ANEURYSM FOR CAROTID;  Surgeon: Rockey LITTIE Peru, MD;  Location: [facility] NEURO ORS;  Service: Neurosurgery;  Laterality: Left;  LEFT Craniotomy for Aneurysm   ECTOPIC PREGNANCY SURGERY     ESOPHAGOGASTRODUODENOSCOPY N/A 09/27/2020   Procedure: ESOPHAGOGASTRODUODENOSCOPY (EGD);  Surgeon: Onita Elspeth Sharper, DO;  Location[facility] ENDOSCOPY;  Service: Gastroenterology;  Laterality: N/A;   KNEE ARTHROSCOPY     LUMBAR LAMINECTOMY/DECOMPRESSION MICRODISCECTOMY N/A 04/27/2014   Procedure: LUMBAR LAMINECTOMY/DECOMPRESSION MICRODISCECTOMY LUMBAR FOUR-FIVE ;  Surgeon: Victory Gens, MD;  Location: [facility] NEURO ORS;  Service: Neurosurgery;  Laterality: N/A;   RADIOLOGY WITH ANESTHESIA N/A 10/09/2012   Procedure: RADIOLOGY WITH ANESTHESIA;  Surgeon: Thyra MARLA Nash, MD;  Location: [facility] OR;  Service: Radiology;  Laterality: N/A;   RENAL ANGIOGRAPHY N/A 03/09/2020   Procedure: RENAL ANGIOGRAPHY;  Surgeon: Jama Cordella MATSU, MD;  Location: [facility] INVASIVE CV LAB;  Service: Cardiovascular;  Laterality: N/A;   TONSILLECTOMY     VASCULAR SURGERY     stent placed    Home Medications:  Allergies as of 11/27/2023       Reactions   Hydromorphone  Itching   Latex Anaphylaxis   Atorvastatin Other (See Comments)   Aches and hair loss   Clonidine  Cough   Clonidine  Derivatives Cough   Lisinopril Cough   Silenor [doxepin] Other (See Comments)   jittery        Medication List        Accurate as of November 25, 2023 11:37 PM. If you have any questions, ask your nurse or doctor.          aspirin  EC 81 MG tablet Take  1 tablet (81 mg total) by mouth daily. Swallow whole.   carvedilol  25 MG tablet Commonly known as: COREG  TAKE 1 TABLET BY MOUTH TWICE  DAILY   diltiazem  240 MG 24 hr capsule Commonly known as: CARDIZEM  CD Take 1 capsule (240 mg total) by mouth daily.   EPINEPHrine  0.3 mg/0.3 mL Soaj injection Commonly known as: EPI-PEN Inject into the muscle once as needed.   esomeprazole  40 MG capsule Commonly known as: NexIUM  Take 1 capsule (40 mg total) by mouth 2 (two) times daily before a meal.   fluticasone  50 MCG/ACT nasal spray Commonly known as: FLONASE  USE 2 SPRAYS IN BOTH NOSTRILS  DAILY   ibuprofen  800 MG tablet Commonly known as: ADVIL  TAKE 1 TABLET BY MOUTH EVERY 8  HOURS AS NEEDED FOR HEADACHE   multivitamin with minerals Tabs tablet Take 1 tablet by mouth daily.   rosuvastatin  10 MG tablet Commonly known as: CRESTOR  TAKE 1 TABLET BY MOUTH EVERY  NIGHT AT BEDTIME   venlafaxine  XR 75 MG 24 hr capsule Commonly known as: Effexor   XR Take 1 capsule (75 mg total) by mouth daily with breakfast.        Allergies:  Allergies  Allergen Reactions   Hydromorphone  Itching   Latex Anaphylaxis   Atorvastatin Other (See Comments)    Aches and hair loss   Clonidine  Cough   Clonidine  Derivatives Cough   Lisinopril Cough   Silenor [Doxepin] Other (See Comments)    jittery    Family History: Family History  Problem Relation Age of Onset   Vision loss Mother    Stroke Father    Heart disease Father    Dementia Father    Arthritis Father    Hypertension Father    Breast cancer Cousin    Dementia Brother    Depression Maternal Grandfather     Social History:  reports that she has never smoked. She has never used smokeless tobacco. She reports current alcohol use of about 1.0 standard drink of alcohol per week. She reports that she does not use drugs.  ROS: Pertinent ROS in HPI  Physical Exam: There were no vitals taken for this visit.  Constitutional:  Well  nourished. Alert and oriented, No acute distress. HEENT: [place] AT, moist mucus membranes.  Trachea midline, no masses. Cardiovascular: No clubbing, cyanosis, or edema. Respiratory: Normal respiratory effort, no increased work of breathing. GU: No CVA tenderness.  No bladder fullness or masses.  Recession of labia minora, dry, pale vulvar vaginal mucosa and loss of mucosal ridges and folds.  Normal urethral meatus, no lesions, no prolapse, no discharge.   No urethral masses, tenderness and/or tenderness. No bladder fullness, tenderness or masses. *** vagina mucosa, *** estrogen effect, no discharge, no lesions, *** pelvic support, *** cystocele and *** rectocele noted.  No cervical motion tenderness.  Uterus is freely mobile and non-fixed.  No adnexal/parametria masses or tenderness noted.  Anus and perineum are without rashes or lesions.   ***  Neurologic: Grossly intact, no focal deficits, moving all 4 extremities. Psychiatric: Normal mood and affect.    Laboratory Data: See Epic and HPI  I have reviewed the labs.   Pertinent Imaging: N/A  Assessment & Plan:  ***  1. Indeterminate renal mass - RUS still pending  2  - criteria for recurrent UTI has been met with 2 or more infections in 6 months or 3 or greater infections in one year ***            - vaginal estrogen cream is first-line therapy for postmenopausal women ***  - patient is instructed to increase their water intake until the urine is pale yellow or clear (10 to 12 cups daily) ***  - taking probiotics that include  Lactobacillus crispatus in premenopausal women and oral capsules with Lactobacillus rhamnosus GR-1 and Lactobacillus reuteri RC-14 in postmenopausal women ***  - Hiprex 1 gram twice daily if creatinine clearance is > 30 ***  - could consider D-mannose or cranberry products, but evidence is weak with contradictory findings ***  - address constipation issues ***  - if using tampons, she should remove them prior to  urinating and change them often ***                                                  No follow-ups on file.  These notes generated with voice recognition software. I apologize for typographical errors.  Amber Brooks  [facility] [address]  [address] [place], [place] [address] [phone]

## 2023-11-27 ENCOUNTER — Ambulatory Visit: Admitting: Urology

## 2023-11-27 DIAGNOSIS — N390 Urinary tract infection, site not specified: Secondary | ICD-10-CM

## 2023-11-28 ENCOUNTER — Ambulatory Visit
Admission: RE | Admit: 2023-11-28 | Discharge: 2023-11-28 | Disposition: A | Discharge status: Home / Self Care | Source: Ambulatory Visit | Attending: Urology | Admitting: Urology

## 2023-11-28 DIAGNOSIS — N281 Cyst of kidney, acquired: Secondary | ICD-10-CM | POA: Diagnosis not present

## 2023-11-28 DIAGNOSIS — N289 Disorder of kidney and ureter, unspecified: Secondary | ICD-10-CM | POA: Diagnosis present

## 2023-12-08 NOTE — Progress Notes (Unsigned)
 12/10/2023 4:27 PM   Amber Brooks June 22, 1945 [id number]  Referring provider: Gretel App, NP [address] [place],  [place] [address]  Urological history: 1. rUTI's - November 07, 2023 - no growth - September 14, 2023 - enterococcus faecalis - May 24, 2023 - <10,000 colonies  2. Indeterminate left renal lesion - RUS (11/2023) simple cyst   No chief complaint on file.  HPI: Amber Brooks is a 78 y.o. woman who presents today for renal ultrasound report and further evaluation for presumptive rUTI's.  Previous records reviewed.  Patient states that she has had *** urinary tract infections over the last year.  Reviewing her records,  she has had *** .    Her symptoms with a urinary tract infection consist of ***.  She has/has not been on any antibiotics for the last 30 days.  She has/has not had a catheter in the last 30 days.    She does/does not have a history of nephrolithiasis, pelvic surgery or pelvic trauma. ***  She does not have any history of GU system abnormalities.  ***  She is/is not sexually active.  She has/has not noted a correlation with her urinary tract infections and sexual intercourse.  ***   She does/does not engage in anal sex. ***  She is/ is not having anal to vaginal sex.*** She is/is not voiding before and after sex. ***     She is/is not postmenopausal. ***  She is/is not *** having vaginal discharge or vaginal irritation.   She is taking/apply *** estrogen cream ***  She admits to/denies constipation, fecal incontinence  and/or diarrhea. ***  She has/does not have incontinence.  She is using incontinence pads. ***  She is having/ not having pain with bladder filling.  ***  She has/has not traveled recently. ***   She has a history of working or walking for long periods of time.  ***  She has *** when she is not having an infection.   She has/not had any recent imaging studies.  ***  She is drinking *** of water  daily.    Patients with rUTIs should have a complete history obtained, including LUTS such as dysuria, frequency, urgency, nocturia, incontinence, hematuria, pneumaturia, and fecaluria. Further information to obtain includes any history of bowel symptoms such as diarrhea, accidental bowel leakage, or constipation; recent use of antibiotics for any medical condition; prior antibiotic-related problems (e.g., C. difficile infection); antibiotic allergies and sensitivities; back or flank pain; catheter usage; vaginal discharge or irritation; menopausal status; post-coital UTI; contraceptive method; and use of spermicides or estrogen- or progesterone-containing products. Details of prior urinary tract or pelvic surgery should be obtained, and patients should be queried as to travel history and history of working or walking for long periods of time. Baseline genitourinary symptoms between infections may also be illuminative, including the number of voids per day, sensation of urge to void, straining to void, a sensation of incomplete emptying, pelvic pressure or heaviness, vaginal bulge, dysuria, dyspareunia, as well as the location, character, and severity of any baseline genitourinary or pelvic pain or discomfort. UTI history includes frequency of UTI, antimicrobial usage, and documentation of positive cultures and the type of cultured microorganisms. Risk factors for complicated UTI, as previously discussed, should also be elucidated.  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   Brain aneurysm    Cataract 2022   Complication of anesthesia    Depression    GERD (gastroesophageal reflux disease)    Headache(784.0)    migraines   History of chicken pox    Hyperlipidemia    Hypertension    Mitral valve prolapse    PONV (postoperative nausea and vomiting)    Shingles      Surgical History: Past Surgical History:  Procedure Laterality Date   ABDOMINAL HYSTERECTOMY  1987   ANEURYSM COILING     APPENDECTOMY  1967   BRAIN SURGERY  2014   CHOLECYSTECTOMY  2018   COLONOSCOPY N/A 09/27/2020   Procedure: COLONOSCOPY;  Surgeon: Onita Elspeth Sharper, DO;  Location: [facility] ENDOSCOPY;  Service: Gastroenterology;  Laterality: N/A;   CRANIOTOMY Left 11/15/2012   Procedure: CRANIOTOMY INTRACRANIAL ANEURYSM FOR CAROTID;  Surgeon: Rockey LITTIE Peru, MD;  Location: [facility] NEURO ORS;  Service: Neurosurgery;  Laterality: Left;  LEFT Craniotomy for Aneurysm   ECTOPIC PREGNANCY SURGERY     ESOPHAGOGASTRODUODENOSCOPY N/A 09/27/2020   Procedure: ESOPHAGOGASTRODUODENOSCOPY (EGD);  Surgeon: Onita Elspeth Sharper, DO;  Location[facility] ENDOSCOPY;  Service: Gastroenterology;  Laterality: N/A;   KNEE ARTHROSCOPY     LUMBAR LAMINECTOMY/DECOMPRESSION MICRODISCECTOMY N/A 04/27/2014   Procedure: LUMBAR LAMINECTOMY/DECOMPRESSION MICRODISCECTOMY LUMBAR FOUR-FIVE ;  Surgeon: Victory Gens, MD;  Location: [facility] NEURO ORS;  Service: Neurosurgery;  Laterality: N/A;   RADIOLOGY WITH ANESTHESIA N/A 10/09/2012   Procedure: RADIOLOGY WITH ANESTHESIA;  Surgeon: Thyra MARLA Nash, MD;  Location: [facility] OR;  Service: Radiology;  Laterality: N/A;   RENAL ANGIOGRAPHY N/A 03/09/2020   Procedure: RENAL ANGIOGRAPHY;  Surgeon: Jama Cordella MATSU, MD;  Location: [facility] INVASIVE CV LAB;  Service: Cardiovascular;  Laterality: N/A;   TONSILLECTOMY     VASCULAR SURGERY     stent placed    Home Medications:  Allergies as of 12/10/2023       Reactions   Hydromorphone  Itching   Latex Anaphylaxis   Atorvastatin Other (See Comments)   Aches and hair loss   Clonidine  Cough   Clonidine  Derivatives Cough   Lisinopril Cough   Silenor [doxepin] Other (See Comments)   jittery        Medication List        Accurate as of December 08, 2023  4:27 PM. If you have any questions, ask your nurse or doctor.           aspirin  EC 81 MG tablet Take 1 tablet (81 mg total) by mouth daily. Swallow whole.   carvedilol  25 MG tablet Commonly known as: COREG  TAKE 1 TABLET BY MOUTH TWICE  DAILY   diltiazem  240 MG 24 hr capsule Commonly known as: CARDIZEM  CD Take 1 capsule (240 mg total) by mouth daily.   EPINEPHrine  0.3 mg/0.3 mL Soaj injection Commonly known as: EPI-PEN Inject into the muscle once as needed.   esomeprazole  40 MG capsule Commonly known as: NexIUM  Take 1 capsule (40 mg total) by mouth 2 (two) times daily before a meal.   fluticasone  50 MCG/ACT nasal spray Commonly known as: FLONASE  USE 2 SPRAYS IN BOTH NOSTRILS  DAILY   ibuprofen  800 MG tablet Commonly known as: ADVIL  TAKE 1 TABLET BY MOUTH EVERY 8  HOURS AS NEEDED FOR HEADACHE   multivitamin with minerals Tabs tablet Take 1 tablet by mouth daily.   rosuvastatin  10 MG tablet Commonly known as: CRESTOR  TAKE 1 TABLET BY MOUTH EVERY  NIGHT AT BEDTIME   venlafaxine  XR 75 MG 24  hr capsule Commonly known as: Effexor  XR Take 1 capsule (75 mg total) by mouth daily with breakfast.        Allergies:  Allergies  Allergen Reactions   Hydromorphone  Itching   Latex Anaphylaxis   Atorvastatin Other (See Comments)    Aches and hair loss   Clonidine  Cough   Clonidine  Derivatives Cough   Lisinopril Cough   Silenor [Doxepin] Other (See Comments)    jittery    Family History: Family History  Problem Relation Age of Onset   Vision loss Mother    Stroke Father    Heart disease Father    Dementia Father    Arthritis Father    Hypertension Father    Breast cancer Cousin    Dementia Brother    Depression Maternal Grandfather     Social History:  reports that she has never smoked. She has never used smokeless tobacco. She reports current alcohol use of about 1.0 standard drink of alcohol per week. She reports that she does not use drugs.  ROS: Pertinent ROS in HPI  Physical Exam: There were no vitals taken for this  visit.  Constitutional:  Well nourished. Alert and oriented, No acute distress. HEENT: [place] AT, moist mucus membranes.  Trachea midline, no masses. Cardiovascular: No clubbing, cyanosis, or edema. Respiratory: Normal respiratory effort, no increased work of breathing. GU: No CVA tenderness.  No bladder fullness or masses.  Recession of labia minora, dry, pale vulvar vaginal mucosa and loss of mucosal ridges and folds.  Normal urethral meatus, no lesions, no prolapse, no discharge.   No urethral masses, tenderness and/or tenderness. No bladder fullness, tenderness or masses. *** vagina mucosa, *** estrogen effect, no discharge, no lesions, *** pelvic support, *** cystocele and *** rectocele noted.  No cervical motion tenderness.  Uterus is freely mobile and non-fixed.  No adnexal/parametria masses or tenderness noted.  Anus and perineum are without rashes or lesions.   ***  Neurologic: Grossly intact, no focal deficits, moving all 4 extremities. Psychiatric: Normal mood and affect.    Laboratory Data: See Epic and HPI  I have reviewed the labs.   Pertinent Imaging: EXAM: US  Retroperitoneum Complete, Renal. 11/28/2023 11:15:32 AM   TECHNIQUE: Real-time ultrasonography of the retroperitoneum renal was performed.   COMPARISON: 09/23/2021   CLINICAL HISTORY: Renal mass   FINDINGS:   FINDINGS: RIGHT KIDNEY/URETER: Right kidney measures 9.7 x 3.8 x 4.0 cm and has a volume of 78.2 cc. Normal cortical echogenicity. No hydronephrosis. Small echogenic foci within the upper pole of the right kidney measure 4 mm. No mass.   LEFT KIDNEY/URETER: Left kidney measures 8.7 x 4.3 x 3.5 cm and has a volume of 69.5 cc. Normal cortical echogenicity. No hydronephrosis. No calculus. A 5 mm cyst is noted within the upper pole of the left kidney. No left renal mass is identified.   BLADDER: Urinary bladder appears normal.   IMPRESSION: 1. Small echogenic foci within the upper pole of the right  kidney measuring 4 mm, which may reflect tiny nonobstructing calculi; no right renal mass identified. 2. No left renal mass identified. 3. 5 mm simple-appearing cyst within the upper pole of the left kidney, likely benign. 4. No acute findings.   Electronically signed by: Waddell Calk MD 12/07/2023 03:56 PM EST RP Workstation: [id number] I have independently reviewed the films.  See HPI.    Assessment & Plan:  ***  1. Indeterminate renal mass - RUS demonstrates a simple cyst  - no further follow  warranted   2 rLUTS - Patient reports ~6 UTIs/year without prior documented evaluation. Requires formal workup to assess contributing factors and guide management. - UA and UCX when having symptoms;patient will call the office to set up lab appointment for UA, UCX when symptomatic                                           No follow-ups on file.  These notes generated with voice recognition software. I apologize for typographical errors.  Amber Brooks  [facility] [address]  [address] [place], [place] [address] [phone]

## 2023-12-10 ENCOUNTER — Encounter: Payer: Self-pay | Admitting: Urology

## 2023-12-10 ENCOUNTER — Ambulatory Visit: Admitting: Urology

## 2023-12-10 VITALS — BP 159/60 | HR 70 | Wt 109.0 lb

## 2023-12-10 DIAGNOSIS — N2889 Other specified disorders of kidney and ureter: Secondary | ICD-10-CM

## 2023-12-10 DIAGNOSIS — N289 Disorder of kidney and ureter, unspecified: Secondary | ICD-10-CM

## 2023-12-10 DIAGNOSIS — R399 Unspecified symptoms and signs involving the genitourinary system: Secondary | ICD-10-CM

## 2023-12-10 MED ORDER — GEMTESA 75 MG PO TABS: 75.0000 mg | ORAL_TABLET | Freq: Every day | ORAL | Status: DC

## 2023-12-10 MED ORDER — GEMTESA 75 MG PO TABS: 75.0000 mg | ORAL_TABLET | Freq: Every day | ORAL | Status: AC

## 2023-12-18 ENCOUNTER — Encounter: Payer: Self-pay | Admitting: *Deleted

## 2023-12-18 NOTE — Progress Notes (Signed)
 Amber Brooks                                          MRN: [id number]   12/18/2023   The [facility] Quality Team Specialist reviewed this patient medical record for the purposes of chart review for care gap closure. The following were reviewed: chart review for care gap closure-osteoporosis management in women fracture.    [facility]BCI Quality Team

## 2023-12-19 ENCOUNTER — Ambulatory Visit: Admitting: Urology

## 2023-12-21 ENCOUNTER — Ambulatory Visit
Admission: RE | Admit: 2023-12-21 | Discharge: 2023-12-21 | Disposition: A | Discharge status: Home / Self Care | Source: Ambulatory Visit | Attending: Urology | Admitting: Urology

## 2023-12-21 DIAGNOSIS — N289 Disorder of kidney and ureter, unspecified: Secondary | ICD-10-CM | POA: Insufficient documentation

## 2023-12-21 DIAGNOSIS — N2889 Other specified disorders of kidney and ureter: Secondary | ICD-10-CM | POA: Insufficient documentation

## 2023-12-21 MED ORDER — IOHEXOL 300 MG/ML  SOLN
65.0000 mL | Freq: Once | INTRAMUSCULAR | Status: AC | PRN
  Administered 2023-12-21: 65 mL via INTRAVENOUS

## 2023-12-24 ENCOUNTER — Other Ambulatory Visit: Payer: Self-pay | Admitting: Nurse Practitioner

## 2023-12-24 ENCOUNTER — Ambulatory Visit: Admitting: Urology

## 2023-12-24 VITALS — BP 174/67 | HR 64 | Ht 62.0 in | Wt 110.0 lb

## 2023-12-24 DIAGNOSIS — Z87440 Personal history of urinary (tract) infections: Secondary | ICD-10-CM | POA: Diagnosis not present

## 2023-12-24 DIAGNOSIS — N390 Urinary tract infection, site not specified: Secondary | ICD-10-CM

## 2023-12-24 DIAGNOSIS — N3281 Overactive bladder: Secondary | ICD-10-CM | POA: Diagnosis not present

## 2023-12-24 DIAGNOSIS — R399 Unspecified symptoms and signs involving the genitourinary system: Secondary | ICD-10-CM

## 2023-12-24 DIAGNOSIS — E782 Mixed hyperlipidemia: Secondary | ICD-10-CM

## 2023-12-24 DIAGNOSIS — I10 Essential (primary) hypertension: Secondary | ICD-10-CM

## 2023-12-24 LAB — MICROSCOPIC EXAMINATION: Bacteria, UA: NONE SEEN

## 2023-12-24 LAB — URINALYSIS, COMPLETE
Bilirubin, UA: NEGATIVE
Glucose, UA: NEGATIVE
Ketones, UA: NEGATIVE
Nitrite, UA: NEGATIVE
Protein,UA: NEGATIVE
RBC, UA: NEGATIVE
Specific Gravity, UA: 1.02 (ref 1.005–1.030)
Urobilinogen, Ur: 0.2 mg/dL (ref 0.2–1.0)
pH, UA: 6 (ref 5.0–7.5)

## 2023-12-24 MED ORDER — GEMTESA 75 MG PO TABS: 75.0000 mg | ORAL_TABLET | Freq: Every day | ORAL | 11 refills | Status: DC

## 2023-12-24 NOTE — Progress Notes (Signed)
 "  12/24/2023 11:05 AM   [name] 1945-05-27 [id number]  Referring provider: [facility] [address] [place],  [place] [address]  Chief Complaint  Patient presents with   Follow-up    HPI: Br/Shannon:   6 bladder infections a year with positive cultures.  Has had a sparc sling in 2003.  Was cleared with a CT scan and MRI in 2022 for left renal lesion.  Did not want OAB medications in the past.  Was given Gemtesa  last visit.  Saw Dr. Georganne Nov 25 regarding left renal lesion and was cleared.  Renal ultrasound December 2025 within normal limit with no left renal masses.  December 24, 2023 The patient has a CT scan of the abdomen with and without contrast December 21, 2023 and the results are pending.  It was ordered by Clotilda.  Patient voids every hour and cannot hold it for 2 hours.  She gets up 3-4 times a night.  No ankle edema.  Sometimes has urge incontinence not wearing a pad.  No stress incontinence or bedwetting.  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   Brain aneurysm    Cataract 2022   Complication of anesthesia    Depression    GERD (gastroesophageal reflux disease)    Headache(784.0)    migraines   History of chicken pox    Hyperlipidemia    Hypertension    Mitral valve prolapse    PONV (postoperative nausea and vomiting)    Shingles     Surgical History: Past Surgical History:  Procedure Laterality Date   ABDOMINAL HYSTERECTOMY  1987   ANEURYSM COILING     APPENDECTOMY  1967   BRAIN SURGERY  2014   CHOLECYSTECTOMY  2018   COLONOSCOPY N/A 09/27/2020   Procedure: COLONOSCOPY;  Surgeon: Onita Elspeth Sharper, DO;  Location: [facility] ENDOSCOPY;  Service: Gastroenterology;  Laterality: N/A;   CRANIOTOMY Left 11/15/2012   Procedure: CRANIOTOMY INTRACRANIAL ANEURYSM FOR CAROTID;  Surgeon: Rockey LITTIE Peru, MD;  Location: [facility] NEURO ORS;   Service: Neurosurgery;  Laterality: Left;  LEFT Craniotomy for Aneurysm   ECTOPIC PREGNANCY SURGERY     ESOPHAGOGASTRODUODENOSCOPY N/A 09/27/2020   Procedure: ESOPHAGOGASTRODUODENOSCOPY (EGD);  Surgeon: Onita Elspeth Sharper, DO;  Location: [facility] ENDOSCOPY;  Service: Gastroenterology;  Laterality: N/A;   KNEE ARTHROSCOPY     LUMBAR LAMINECTOMY/DECOMPRESSION MICRODISCECTOMY N/A 04/27/2014   Procedure: LUMBAR LAMINECTOMY/DECOMPRESSION MICRODISCECTOMY LUMBAR FOUR-FIVE ;  Surgeon: Victory Gens, MD;  Location: [facility] NEURO ORS;  Service: Neurosurgery;  Laterality: N/A;   RADIOLOGY WITH ANESTHESIA N/A 10/09/2012   Procedure: RADIOLOGY WITH ANESTHESIA;  Surgeon: Thyra MARLA Nash, MD;  Location: [facility] OR;  Service: Radiology;  Laterality: N/A;   RENAL ANGIOGRAPHY N/A 03/09/2020   Procedure: RENAL ANGIOGRAPHY;  Surgeon: Jama Cordella MATSU, MD;  Location: [facility] INVASIVE CV LAB;  Service: Cardiovascular;  Laterality: N/A;   TONSILLECTOMY     VASCULAR SURGERY     stent placed    Home Medications:  Allergies as of 12/24/2023       Reactions   Hydromorphone  Itching   Latex Anaphylaxis   Atorvastatin Other (See Comments)   Aches and hair loss   Clonidine  Cough   Clonidine  And Derivatives Cough   Lisinopril Cough   Silenor [doxepin] Other (See Comments)   jittery  Medication List        Accurate as of December 24, 2023 11:05 AM. If you have any questions, ask your nurse or doctor.          aspirin  EC 81 MG tablet Take 1 tablet (81 mg total) by mouth daily. Swallow whole.   carvedilol  25 MG tablet Commonly known as: COREG  TAKE 1 TABLET BY MOUTH TWICE  DAILY   diltiazem  240 MG 24 hr capsule Commonly known as: CARDIZEM  CD Take 1 capsule (240 mg total) by mouth daily.   EPINEPHrine  0.3 mg/0.3 mL Soaj injection Commonly known as: EPI-PEN Inject into the muscle once as needed.   esomeprazole  40 MG capsule Commonly known as: NexIUM  Take 1 capsule (40 mg total) by mouth 2 (two)  times daily before a meal.   fluticasone  50 MCG/ACT nasal spray Commonly known as: FLONASE  USE 2 SPRAYS IN BOTH NOSTRILS  DAILY   Gemtesa  75 MG Tabs Generic drug: Vibegron  Take 1 tablet (75 mg total) by mouth daily for 28 days. What changed: Another medication with the same name was removed. Continue taking this medication, and follow the directions you see here.   ibuprofen  800 MG tablet Commonly known as: ADVIL  TAKE 1 TABLET BY MOUTH EVERY 8  HOURS AS NEEDED FOR HEADACHE   multivitamin with minerals Tabs tablet Take 1 tablet by mouth daily.   rosuvastatin  10 MG tablet Commonly known as: CRESTOR  TAKE 1 TABLET BY MOUTH EVERY  NIGHT AT BEDTIME   traZODone  50 MG tablet Commonly known as: DESYREL  Take 50 mg by mouth at bedtime.   venlafaxine  XR 75 MG 24 hr capsule Commonly known as: Effexor  XR Take 1 capsule (75 mg total) by mouth daily with breakfast.        Allergies: Allergies[1]  Family History: Family History  Problem Relation Age of Onset   Vision loss Mother    Stroke Father    Heart disease Father    Dementia Father    Arthritis Father    Hypertension Father    Breast cancer Cousin    Dementia Brother    Depression Maternal Grandfather     Social History:  reports that she has never smoked. She has never used smokeless tobacco. She reports current alcohol use of about 1.0 standard drink of alcohol per week. She reports that she does not use drugs.  ROS:                                        Physical Exam: BP (!) 174/67 (BP Location: Right Arm, Patient Position: Sitting, Cuff Size: Normal)   Pulse 64   Ht 5' 2 (1.575 m)   Wt 49.9 kg   SpO2 98%   BMI 20.12 kg/m   Constitutional:  Alert and oriented, No acute distress. HEENT: [place] AT, moist mucus membranes.  Trachea midline, no masses.  Laboratory Data: Lab Results  Component Value Date   WBC 10.0 11/07/2023   HGB 12.6 11/07/2023   HCT 38.0 11/07/2023   MCV 93  11/07/2023   PLT 207 11/07/2023    Lab Results  Component Value Date   CREATININE 0.83 11/07/2023    No results found for: PSA  No results found for: TESTOSTERONE  Lab Results  Component Value Date   HGBA1C 5.5 09/14/2023    Urinalysis    Component Value Date/Time   COLORURINE YELLOW 04/26/2014 1200  APPEARANCEUR Clear 11/19/2023 1105   LABSPEC 1.006 04/26/2014 1200   PHURINE 7.5 04/26/2014 1200   GLUCOSEU Negative 11/19/2023 1105   HGBUR NEGATIVE 04/26/2014 1200   BILIRUBINUR Negative 11/19/2023 1105   KETONESUR trace (5) (A) 05/24/2023 1744   KETONESUR NEGATIVE 04/26/2014 1200   PROTEINUR Negative 11/19/2023 1105   PROTEINUR NEGATIVE 04/26/2014 1200   UROBILINOGEN 1.0 05/24/2023 1744   UROBILINOGEN 0.2 04/26/2014 1200   NITRITE Negative 11/19/2023 1105   NITRITE NEGATIVE 04/26/2014 1200   LEUKOCYTESUR Negative 11/19/2023 1105    Pertinent Imaging: Urine normal with no blood in urine.  Assessment & Plan: Patient has recurrent UTIs.  Pathophysiology discussed.  She clinically has an overactive bladder.  Role of trying Gemtesa  was discussed.  Role of pelvic examination and cystoscopy discussed the put her mind at rest especially having a previous sling.  She will be called with CT results from our extender  I also gave the patient a Gemtesa  prescription if needed before I see her.  I found the conversation sometimes circular and less productive as I think many of the topics have been previously discussed with other provider.  I tried to reassure her that if the CT scan is normal she does not have cancer.  I also commented that I did not know how much Gemtesa  would cost her but I want to give her a prescription for her convenience.   1. Recurrent UTI (Primary)  - Urinalysis, Complete   No follow-ups on file.  Glendia DELENA Elizabeth, MD  [facility] [address] [place], [place] [address] [phone]     [1]   Allergies Allergen Reactions   Hydromorphone  Itching   Latex Anaphylaxis   Atorvastatin Other (See Comments)    Aches and hair loss   Clonidine  Cough   Clonidine  And Derivatives Cough   Lisinopril Cough   Silenor [Doxepin] Other (See Comments)    jittery   "

## 2023-12-28 ENCOUNTER — Ambulatory Visit: Admitting: Cardiology

## 2024-01-01 ENCOUNTER — Other Ambulatory Visit: Payer: Self-pay | Admitting: Nurse Practitioner

## 2024-01-01 DIAGNOSIS — R519 Headache, unspecified: Secondary | ICD-10-CM

## 2024-01-02 ENCOUNTER — Ambulatory Visit: Admitting: Family Medicine

## 2024-01-09 ENCOUNTER — Ambulatory Visit: Payer: Self-pay | Admitting: Urology

## 2024-01-14 ENCOUNTER — Ambulatory Visit: Admitting: Urology

## 2024-01-24 ENCOUNTER — Encounter: Payer: Self-pay | Admitting: Family Medicine

## 2024-01-24 ENCOUNTER — Ambulatory Visit: Admitting: Family Medicine

## 2024-01-24 VITALS — BP 147/66 | HR 62 | Temp 98.0°F | Ht 62.0 in | Wt 111.5 lb

## 2024-01-24 DIAGNOSIS — J301 Allergic rhinitis due to pollen: Secondary | ICD-10-CM

## 2024-01-24 DIAGNOSIS — S12111K Posterior displaced Type II dens fracture, subsequent encounter for fracture with nonunion: Secondary | ICD-10-CM

## 2024-01-24 DIAGNOSIS — E782 Mixed hyperlipidemia: Secondary | ICD-10-CM

## 2024-01-24 DIAGNOSIS — E673 Hypervitaminosis D: Secondary | ICD-10-CM

## 2024-01-24 DIAGNOSIS — Z789 Other specified health status: Secondary | ICD-10-CM | POA: Diagnosis not present

## 2024-01-24 DIAGNOSIS — I773 Arterial fibromuscular dysplasia: Secondary | ICD-10-CM

## 2024-01-24 DIAGNOSIS — I2510 Atherosclerotic heart disease of native coronary artery without angina pectoris: Secondary | ICD-10-CM

## 2024-01-24 DIAGNOSIS — D72829 Elevated white blood cell count, unspecified: Secondary | ICD-10-CM

## 2024-01-24 DIAGNOSIS — I6523 Occlusion and stenosis of bilateral carotid arteries: Secondary | ICD-10-CM

## 2024-01-24 DIAGNOSIS — Z79899 Other long term (current) drug therapy: Secondary | ICD-10-CM | POA: Diagnosis not present

## 2024-01-24 DIAGNOSIS — F331 Major depressive disorder, recurrent, moderate: Secondary | ICD-10-CM | POA: Diagnosis not present

## 2024-01-24 DIAGNOSIS — I10 Essential (primary) hypertension: Secondary | ICD-10-CM | POA: Diagnosis not present

## 2024-01-24 DIAGNOSIS — I471 Supraventricular tachycardia, unspecified: Secondary | ICD-10-CM

## 2024-01-24 DIAGNOSIS — I472 Ventricular tachycardia, unspecified: Secondary | ICD-10-CM

## 2024-01-24 DIAGNOSIS — T65811S Toxic effect of latex, accidental (unintentional), sequela: Secondary | ICD-10-CM

## 2024-01-24 DIAGNOSIS — N182 Chronic kidney disease, stage 2 (mild): Secondary | ICD-10-CM | POA: Diagnosis not present

## 2024-01-24 DIAGNOSIS — F5104 Psychophysiologic insomnia: Secondary | ICD-10-CM

## 2024-01-24 DIAGNOSIS — K219 Gastro-esophageal reflux disease without esophagitis: Secondary | ICD-10-CM

## 2024-01-24 DIAGNOSIS — R519 Headache, unspecified: Secondary | ICD-10-CM

## 2024-01-24 DIAGNOSIS — Z8679 Personal history of other diseases of the circulatory system: Secondary | ICD-10-CM

## 2024-01-24 MED ORDER — ESOMEPRAZOLE MAGNESIUM 20 MG PO CPDR: 20.0000 mg | DELAYED_RELEASE_CAPSULE | Freq: Two times a day (BID) | ORAL | 2 refills | Status: AC

## 2024-01-24 MED ORDER — IBUPROFEN 800 MG PO TABS: ORAL_TABLET | ORAL | 1 refills | Status: AC

## 2024-01-24 MED ORDER — VENLAFAXINE HCL ER 75 MG PO CP24: 75.0000 mg | ORAL_CAPSULE | Freq: Every day | ORAL | 3 refills | Status: AC

## 2024-01-24 MED ORDER — TRAZODONE HCL 50 MG PO TABS: 50.0000 mg | ORAL_TABLET | Freq: Every day | ORAL | 3 refills | Status: AC

## 2024-01-24 MED ORDER — CARVEDILOL 25 MG PO TABS: 25.0000 mg | ORAL_TABLET | Freq: Two times a day (BID) | ORAL | 3 refills | Status: AC

## 2024-01-24 MED ORDER — NEFFY 2 MG/0.1ML NA SOLN: 1.0000 | Freq: Once | NASAL | 1 refills | Status: DC | PRN

## 2024-01-24 MED ORDER — EPINEPHRINE 0.3 MG/0.3ML IJ SOAJ: 0.3000 mg | Freq: Once | INTRAMUSCULAR | 1 refills | Status: AC | PRN

## 2024-01-24 MED ORDER — FLUTICASONE PROPIONATE 50 MCG/ACT NA SUSP: 1.0000 | Freq: Every day | NASAL | 3 refills | Status: AC

## 2024-01-24 MED ORDER — ROSUVASTATIN CALCIUM 20 MG PO TABS: 20.0000 mg | ORAL_TABLET | Freq: Every day | ORAL | 3 refills | Status: AC

## 2024-01-24 NOTE — Progress Notes (Signed)
 "   New patient visit   Patient: Amber Brooks   DOB: [birth year]   79 y.o. Female  MRN: [id number] Visit Date: 01/24/2024  Today's healthcare provider: LAURAINE LOISE BUOY, DO   Chief Complaint  Patient presents with   Transitions Of Care    Patient is here to establish care with a different provider, concerned about some of her labs that she has had recently.  Vaccine declined   Subjective    Amber Brooks is a 79 y.o. female who presents today as a established patient to transfer care from her previous provider who is no longer at the clinic.   HPI HPI     Transitions Of Care    Additional comments: Patient is here to establish care with a different provider, concerned about some of her labs that she has had recently.  Vaccine declined      Last edited by Terrel Powell CROME, CMA on 01/24/2024 10:44 AM.      Amber Brooks is a 79 year old female who presents with concerns about her lab results and medication management.  She has a history of a neck fracture (closed odontoid fracture) which occurred on 06/10/2023, and which led to a significant decline in her health. During her hospitalization, she notes that she had abnormal lab results, including an elevated white blood cell counts and low hemoglobin. Her blood counts have since normalized.  She has cardiac issues, as indicated by a long-term heart monitor, including paroxysmal supraventricular tachycardia and a brief run of ventricular tachycardia (4 beats).  Her heart monitor also showed a 3.6 second, 17-beat sinus pause. About three weeks ago, she reports that she experienced a heart flutter and a drop in blood pressure.  Amlodipine  was stopped and she was started on Cardizem  240 mg by her cardiologist.  She is also on carvedilol  for her heart condition.  She is under the care of a nephrologist for a renal lesion and has experienced urinary frequency since her neck injury. She also has a history of urinary tract  infections.  Her current medications include esomeprazole  once daily in the morning, rosuvastatin  10 mg, ibuprofen  for neck pain, trazodone  for insomnia, and venlafaxine  75 mg for depression.  She has a family history of reflux, and reports her father had similar issues. She is trying to regain weight after losing it post-injury and works full-time, enjoying her job despite her health challenges.  She reports frequent thirst and increased water intake, which she attributes to changes after her neck injury. No history of diabetes or thyroid  issues.     Past Medical History:  Diagnosis Date   Allergy 1968   Latex   Anxiety    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   Brain aneurysm    Cataract 2022   Chronic kidney disease, stage 2, mildly decreased GFR 01/24/2024   Complication of anesthesia    Depression    When my dad died   GERD (gastroesophageal reflux disease)    Headache(784.0)    migraines   History of chicken pox    Hyperlipidemia    Hypertension    Mitral valve prolapse    Myocardial infarction (HCC)    PONV (postoperative nausea and vomiting)    Shingles    Past Surgical History:  Procedure Laterality Date   ABDOMINAL HYSTERECTOMY  1987   ANEURYSM COILING     APPENDECTOMY  1967   BRAIN SURGERY  2014   CHOLECYSTECTOMY  2018   COLONOSCOPY N/A 09/27/2020  Procedure: COLONOSCOPY;  Surgeon: Onita Elspeth Sharper, DO;  Location: [facility] ENDOSCOPY;  Service: Gastroenterology;  Laterality: N/A;   CRANIOTOMY Left 11/15/2012   Procedure: CRANIOTOMY INTRACRANIAL ANEURYSM FOR CAROTID;  Surgeon: Rockey LITTIE Peru, MD;  Location: [facility] NEURO ORS;  Service: Neurosurgery;  Laterality: Left;  LEFT Craniotomy for Aneurysm   ECTOPIC PREGNANCY SURGERY     ESOPHAGOGASTRODUODENOSCOPY N/A 09/27/2020   Procedure: ESOPHAGOGASTRODUODENOSCOPY (EGD);  Surgeon: Onita Elspeth Sharper, DO;  Location: [facility] ENDOSCOPY;  Service: Gastroenterology;  Laterality: N/A;   KNEE ARTHROSCOPY     LUMBAR LAMINECTOMY/DECOMPRESSION  MICRODISCECTOMY N/A 04/27/2014   Procedure: LUMBAR LAMINECTOMY/DECOMPRESSION MICRODISCECTOMY LUMBAR FOUR-FIVE ;  Surgeon: Victory Gens, MD;  Location: [facility] NEURO ORS;  Service: Neurosurgery;  Laterality: N/A;   RADIOLOGY WITH ANESTHESIA N/A 10/09/2012   Procedure: RADIOLOGY WITH ANESTHESIA;  Surgeon: Thyra MARLA Nash, MD;  Location: [facility] OR;  Service: Radiology;  Laterality: N/A;   RENAL ANGIOGRAPHY N/A 03/09/2020   Procedure: RENAL ANGIOGRAPHY;  Surgeon: Jama Cordella MATSU, MD;  Location: [facility] INVASIVE CV LAB;  Service: Cardiovascular;  Laterality: N/A;   TONSILLECTOMY     TUBAL LIGATION     VASCULAR SURGERY     stent placed   Family Status  Relation Name Status   Mother Raoul welker Deceased   Father Glean welker Deceased   Cousin  (Not Specified)   Brother  (Not Specified)   MGF Nature Conservation Officer (Not Specified)  No partnership data on file   Family History  Problem Relation Age of Onset   Vision loss Mother    Stroke Father    Heart disease Father    Dementia Father    Arthritis Father    Hypertension Father    Breast cancer Cousin    Dementia Brother    Depression Maternal Grandfather    Social History   Socioeconomic History   Marital status: Divorced    Spouse name: Not on file   Number of children: 1   Years of education: College   Highest education level: Associate degree: occupational, scientist, product/process development, or vocational program  Occupational History   Occupation: [employer]  Tobacco Use   Smoking status: Never   Smokeless tobacco: Never  Vaping Use   Vaping status: Never Used  Substance and Sexual Activity   Alcohol use: Not Currently   Drug use: No   Sexual activity: Not Currently  Other Topics Concern   Not on file  Social History Narrative   Right handed.  Caffeine decaf 1 cup in am.  Divorced.  1 adopted daughter.  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    Social Drivers of Health   Tobacco Use: Low Risk (01/24/2024)   Patient History    Smoking Tobacco Use: Never     Smokeless Tobacco Use: Never    Passive Exposure: Not on file  Financial Resource Strain: Low Risk  (12/17/2023)   Received from [facility]   Overall Financial Resource Strain (CARDIA)    Difficulty of Paying Living Expenses: Not hard at all  Food Insecurity: No Food Insecurity (12/17/2023)   Received from [facility]   Epic    Within the past 12 months, you worried that your food would run out before you got the money to buy more.: Never true    Within the past 12 months, the food you bought just didn't last and you didn't have money to get more.: Never true  Transportation Needs: No Transportation Needs (12/17/2023)   Received from [facility]  PRAPARE - Transportation    In the past 12 months, has lack of transportation kept you from medical appointments or from getting medications?: No    Lack of Transportation (Non-Medical): No  Physical Activity: Insufficiently Active (11/06/2023)   Exercise Vital Sign    Days of Exercise per Week: 3 days    Minutes of Exercise per Session: 30 min  Stress: No Stress Concern Present (11/06/2023)   [employer] of Occupational Health - Occupational Stress Questionnaire    Feeling of Stress: Only a little  Social Connections: Moderately Integrated (11/06/2023)   Social Connection and Isolation Panel    Frequency of Communication with Friends and Family: More than three times a week    Frequency of Social Gatherings with Friends and Family: Twice a week    Attends Religious Services: More than 4 times per year    Active Member of Clubs or Organizations: Yes    Attends Banker Meetings: 1 to 4 times per year    Marital Status: Divorced  Depression (PHQ2-9): Low Risk (11/07/2023)   Depression (PHQ2-9)    PHQ-2 Score: 0  Alcohol Screen: Low Risk (11/06/2023)   Alcohol Screen    Last Alcohol Screening Score (AUDIT): 1  Housing: Unknown (12/17/2023)   Received from [facility]   Epic    In the last 12 months, was there a time when you were not able to pay the mortgage or rent on time?: No    Number of Times Moved in the Last Year: Not on file    At any time in the past 12 months, were you homeless or living in a shelter (including now)?: No  Utilities: Not At Risk (12/17/2023)   Received from [facility]   Epic    In the past 12 months has the electric, gas, oil, or water company threatened to shut off services in your home?: No  Health Literacy: Adequate Health Literacy (11/07/2023)   B1300 Health Literacy    Frequency of need for help with medical instructions: Never   Show/hide medication list[1] Allergies[2]  Immunization History  Administered Date(s) Administered    sv, Bivalent, Protein Subunit Rsvpref,pf (Abrysvo) 10/28/2021   Fluad Quad(high Dose 65+) 11/05/2020, 10/13/2021   Fluad Trivalent(High Dose 65+) 12/08/2022   Fluzone  Influenza virus vaccine,trivalent (IIV3), split virus 11/22/2007, 12/28/2011, 09/16/2015   INFLUENZA, HIGH DOSE SEASONAL PF 10/06/2013, 11/09/2014, 10/20/2016, 08/31/2017   Influenza-Unspecified 09/11/2012, 09/12/2019   PFIZER(Purple Top)SARS-COV-2 Vaccination 02/16/2019, 03/18/2019, 10/10/2019   PPD Test 08/15/2022   Pneumococcal Conjugate-13 10/06/2013, 07/06/2014   Pneumococcal Polysaccharide-23 06/28/2011, 09/16/2015   Td 06/02/2001   Td (Adult) 06/02/2001   Tdap 09/11/2012, 04/13/2023    Health Maintenance  Topic Date Due   Medicare Annual Wellness (AWV)  02/25/2023   Influenza Vaccine  04/01/2024 (Originally 08/03/2023)   Zoster Vaccines- Shingrix (1 of 2) 04/23/2024 (Originally 06/04/1964)   COVID-19 Vaccine (4 - 2025-26 season) 09/01/2024 (Originally 09/03/2023)   DTaP/Tdap/Td (5 - Td or Tdap) 04/12/2033   Pneumococcal Vaccine: 50+ Years  Completed   Bone Density Scan  Completed   Hepatitis C Screening  Completed   Meningococcal B Vaccine  Aged Out   Mammogram  Discontinued    Colonoscopy  Discontinued    Patient Care Team: Donzella Lauraine SAILOR, DO as PCP - General (Family Medicine) Darliss Rogue, MD as PCP - Cardiology (Cardiology)       Objective    BP (!) 147/66 (BP Location: Right Arm, Patient Position:  Sitting, Cuff Size: Normal)   Pulse 62   Temp 98 F (36.7 C) (Oral)   Ht 5' 2 (1.575 m)   Wt 111 lb 8 oz (50.6 kg)   SpO2 99%   BMI 20.39 kg/m     Physical Exam Constitutional:      Appearance: Normal appearance.  HENT:     Head: Normocephalic and atraumatic.  Eyes:     General: No scleral icterus.    Extraocular Movements: Extraocular movements intact.     Conjunctiva/sclera: Conjunctivae normal.  Cardiovascular:     Rate and Rhythm: Normal rate and regular rhythm.     Pulses: Normal pulses.     Heart sounds: Normal heart sounds.  Pulmonary:     Effort: Pulmonary effort is normal. No respiratory distress.     Breath sounds: Normal breath sounds.  Abdominal:     General: Bowel sounds are normal.     Palpations: Abdomen is soft.  Musculoskeletal:     Right lower leg: No edema.     Left lower leg: No edema.  Skin:    General: Skin is warm and dry.  Neurological:     Mental Status: She is alert and oriented to person, place, and time. Mental status is at baseline.  Psychiatric:        Mood and Affect: Mood normal.        Behavior: Behavior normal.     Depression Screen    11/07/2023    9:25 AM 09/14/2023   11:07 AM 03/16/2023    1:36 PM 12/08/2022    9:59 AM  PHQ 2/9 Scores  PHQ - 2 Score 0  0 0  PHQ- 9 Score 0   0  0   Exception Documentation  Patient refusal       Data saved with a previous flowsheet row definition   No results found for any visits on 01/24/24.  Assessment & Plan     Primary hypertension -     Microalbumin / creatinine urine ratio -     Comprehensive metabolic panel with GFR -     Carvedilol ; Take 1 tablet (25 mg total) by mouth 2 (two) times daily.  Dispense: 200 tablet; Refill: 3  Transition  of care  Chronic kidney disease, stage 2, mildly decreased GFR  Gastroesophageal reflux disease, unspecified whether esophagitis present -     Vitamin B12 -     Esomeprazole  Magnesium ; Take 1 capsule (20 mg total) by mouth 2 (two) times daily before a meal.  Dispense: 200 capsule; Refill: 2  High risk medication use -     Vitamin B12  Mixed hyperlipidemia -     Lipid panel -     Rosuvastatin  Calcium ; Take 1 tablet (20 mg total) by mouth daily.  Dispense: 100 tablet; Refill: 3  Nonobstructive atherosclerosis of coronary artery  Paroxysmal supraventricular tachycardia  Ventricular tachycardia (HCC)  Carotid artery stenosis, asymptomatic, bilateral  History of cerebral aneurysm  Moderate episode of recurrent major depressive disorder (HCC) -     Venlafaxine  HCl ER; Take 1 capsule (75 mg total) by mouth daily with breakfast.  Dispense: 100 capsule; Refill: 3  Psychophysiologic insomnia -     traZODone  HCl; Take 1 tablet (50 mg total) by mouth at bedtime.  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  Dispense: 1 each; Refill: 1 -  Neffy ; Place 1 spray into the nose once as needed for up to 1 dose. May take 1 additional spray in the same nostril beginning 5 minutes after the first dose, if not improving or with worsening symptoms; avoid sniffing during and after administration  Dispense: 2 each; Refill: 1  Seasonal allergic rhinitis due to pollen -     Fluticasone  Propionate; Place 1-2 sprays into both nostrils daily.  Dispense: 48 mL; Refill: 3  Nonintractable headache, unspecified chronicity pattern, unspecified headache type -     Ibuprofen ; TAKE 1 TABLET BY MOUTH EVERY 8  HOURS AS NEEDED FOR PAIN/HEADACHE  Dispense: 180 tablet; Refill: 1  Odontoid fracture with type II morphology, posterior displacement, and nonunion, subsequent  encounter  Leukocytosis, unspecified type -     CBC  Hypervitaminosis D -     VITAMIN D  25 Hydroxy (Vit-D Deficiency, Fractures)      Transition of care Transitioning care from previous provider due to provider's departure.  Primary hypertension Hypertension managed with medication. Blood pressure control is important to prevent further kidney damage. - Continue current antihypertensive regimen.  Chronic kidney disease, stage 2, mildly decreased GFR GFR consistently between 60 and 90. Monitoring required due to hypertension. - Ordered urine test to check for proteinuria.  Gastroesophageal reflux disease, unspecified whether esophagitis present; high risk medication use GERD managed with esomeprazole . Discussed reducing dose to minimize side effects. Advised dietary modifications. - Reduced esomeprazole  to 20 mg daily. - Advised dietary modifications and avoiding lying down within two hours of eating. - Instructed to increase dose to 40 mg if symptoms worsen.  Mixed hyperlipidemia; nonobstructive atherosclerosis of coronary artery Chronic, LDL levels stable on rosuvastatin  10 mg daily, but above goal given atherosclerotic disease (LDL <55). Discussed increasing rosuvastatin  due to multiple areas of arterial stenosis, including left anterior descending artery and bilateral carotid arteries. - Increased rosuvastatin  dosage.  Paroxysmal supraventricular tachycardia; ventricular tachycardia Intermittent episodes managed with Cardizem  240 mg daily and carvedilol  25 mg twice daily. Recent brief episode of ventricular tachycardia not overly concerning due to very brief nature and single incidence; addressed by cardiology with medication changes. - Continue Cardizem  and carvedilol  as prescribed.  No changes today. - Continue to follow with cardiology; defer to specialist management.  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 Discussed increasing rosuvastatin  to prevent further stenosis progression, in light of borderline high LDL. - Increased rosuvastatin  dosage to 20 mg daily.  Moderate episode of recurrent major depressive disorder Chronic, stable.  Well-managed with venlafaxine . Previous attempts to reduce dose were only partially successful (she was previously on 150 mg daily). - Continue venlafaxine  75 mg daily.  Psychophysiologic insomnia Managed with trazodone  50 mg. Current dose is only partially effective. Discussed potential increase but she declined. - Continue trazodone  50 mg nightly.  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 Insurance coverage to be determined. - Sent prescriptions for EpiPen  and nasal spray epinephrine .  Seasonal allergic rhinitis Well-managed with fluticasone  nasal spray.  Continue fluticasone  as prescribed (noted above); will refill today.  Odontoid fracture with type II morphology, posterior displacement and nonunion Patient incurred a type II odontoid fracture after a misstep and fall downstairs.  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  Patient is very careful with her neck  movement and continues to follow with neurosurgery; defer to specialist management.  Non-intractable headache, unspecified chronicity, unspecified headache type Managed with intermittently with ibuprofen . Discussed potential gastrointestinal side effects and alternative medications. - Continue ibuprofen  as needed for headache. - Consider alternative medications like meloxicam or Celebrex if needed.  Leukocytosis, unspecified type Intermittent leukocytosis likely reactive due to past illnesses or  surgeries. No consistent elevation to warrant hematology referral. - Ordered blood count to monitor white blood cell levels, per patient preference.  Hypervitaminosis D Recent incident of elevated vitamin D .  Will recheck level today and discussed with patient as needed.    Return in about 3 months (around 04/23/2024) for  CPE, and as soon as available for mAWV.     I discussed the assessment and treatment plan with the patient  The patient was provided an opportunity to ask questions and all were answered. The patient agreed with the plan and demonstrated an understanding of the instructions.   The patient was advised to call back or seek an in-person evaluation if the symptoms worsen or if the condition fails to improve as anticipated.  Total time was 60 minutes. That includes chart review before the visit, the actual patient visit, and time spent on documentation after the visit.    LAURAINE LOISE BUOY, DO  [facility] [phone] (phone) [phone] (fax)  [facility]       [1]  Outpatient Medications Prior to Visit  Medication Sig   aspirin  EC 81 MG tablet Take 1 tablet (81 mg total) by mouth daily. Swallow whole.   diltiazem  (CARDIZEM  CD) 240 MG 24 hr capsule Take 1 capsule (240 mg total) by mouth daily.   [DISCONTINUED] carvedilol  (COREG ) 25 MG tablet TAKE 1 TABLET BY MOUTH TWICE  DAILY   [DISCONTINUED] EPINEPHrine  0.3 mg/0.3 mL IJ SOAJ injection Inject into the muscle once as needed.   [DISCONTINUED] esomeprazole  (NEXIUM ) 40 MG capsule Take 1 capsule (40 mg total) by mouth 2 (two) times daily before a meal.   [DISCONTINUED] fluticasone  (FLONASE ) 50 MCG/ACT nasal spray USE 2 SPRAYS IN BOTH NOSTRILS  DAILY   [DISCONTINUED] ibuprofen  (ADVIL ) 800 MG tablet TAKE 1 TABLET BY MOUTH EVERY 8  HOURS AS NEEDED FOR HEADACHE   [DISCONTINUED] rosuvastatin  (CRESTOR ) 10 MG tablet TAKE 1 TABLET BY MOUTH EVERY  NIGHT AT BEDTIME   [DISCONTINUED]  traZODone  (DESYREL ) 50 MG tablet Take 50 mg by mouth at bedtime.   [DISCONTINUED] venlafaxine  XR (EFFEXOR  XR) 75 MG 24 hr capsule Take 1 capsule (75 mg total) by mouth daily with breakfast.   [DISCONTINUED] Multiple Vitamin (MULTIVITAMIN WITH MINERALS) TABS tablet Take 1 tablet by mouth daily.   [DISCONTINUED] Vibegron  (GEMTESA ) 75 MG TABS Take 1 tablet (75 mg total) by mouth daily.   No facility-administered medications prior to visit.  [2]  Allergies Allergen Reactions   Hydromorphone  Itching   Latex Anaphylaxis   Atorvastatin Other (See Comments)    Aches and hair loss   Clonidine  Cough   Clonidine  And Derivatives Cough   Lisinopril Cough   Silenor [Doxepin] Other (See Comments)    jittery   "

## 2024-01-25 LAB — COMPREHENSIVE METABOLIC PANEL WITH GFR
ALT: 21 IU/L (ref 0–32)
AST: 29 IU/L (ref 0–40)
Albumin: 5 g/dL — ABNORMAL HIGH (ref 3.8–4.8)
Alkaline Phosphatase: 83 IU/L (ref 49–135)
BUN/Creatinine Ratio: 31 — ABNORMAL HIGH (ref 12–28)
BUN: 22 mg/dL (ref 8–27)
Bilirubin Total: 0.4 mg/dL (ref 0.0–1.2)
CO2: 24 mmol/L (ref 20–29)
Calcium: 10.1 mg/dL (ref 8.7–10.3)
Chloride: 100 mmol/L (ref 96–106)
Creatinine, Ser: 0.71 mg/dL (ref 0.57–1.00)
Globulin, Total: 2.3 g/dL (ref 1.5–4.5)
Glucose: 90 mg/dL (ref 70–99)
Potassium: 4.6 mmol/L (ref 3.5–5.2)
Sodium: 143 mmol/L (ref 134–144)
Total Protein: 7.3 g/dL (ref 6.0–8.5)
eGFR: 88 mL/min/1.73

## 2024-01-25 LAB — MICROALBUMIN / CREATININE URINE RATIO
Creatinine, Urine: 158.4 mg/dL
Microalb/Creat Ratio: 17 mg/g{creat} (ref 0–29)
Microalbumin, Urine: 27.3 ug/mL

## 2024-01-25 LAB — VITAMIN B12: Vitamin B-12: >2000 pg/mL — ABNORMAL HIGH (ref 232–1245)

## 2024-01-25 LAB — CBC
Hematocrit: 40.9 % (ref 34.0–46.6)
Hemoglobin: 13.2 g/dL (ref 11.1–15.9)
MCH: 30.6 pg (ref 26.6–33.0)
MCHC: 32.3 g/dL (ref 31.5–35.7)
MCV: 95 fL (ref 79–97)
Platelets: 235 x10E3/uL (ref 150–450)
RBC: 4.31 x10E6/uL (ref 3.77–5.28)
RDW: 12.4 % (ref 11.7–15.4)
WBC: 8.2 x10E3/uL (ref 3.4–10.8)

## 2024-01-25 LAB — LIPID PANEL
Chol/HDL Ratio: 1.8 ratio (ref 0.0–4.4)
Cholesterol, Total: 170 mg/dL (ref 100–199)
HDL: 95 mg/dL
LDL Chol Calc (NIH): 62 mg/dL (ref 0–99)
Triglycerides: 70 mg/dL (ref 0–149)
VLDL Cholesterol Cal: 13 mg/dL (ref 5–40)

## 2024-01-25 LAB — VITAMIN D 25 HYDROXY (VIT D DEFICIENCY, FRACTURES): Vit D, 25-Hydroxy: 139 ng/mL — ABNORMAL HIGH (ref 30.0–100.0)

## 2024-02-01 ENCOUNTER — Ambulatory Visit: Admitting: Family Medicine

## 2024-02-05 ENCOUNTER — Ambulatory Visit: Admitting: Family Medicine

## 2024-02-08 ENCOUNTER — Encounter: Payer: Self-pay | Admitting: Family Medicine

## 2024-02-08 ENCOUNTER — Ambulatory Visit: Admitting: Family Medicine

## 2024-02-08 VITALS — BP 135/71 | HR 64 | Temp 98.0°F | Ht 62.0 in | Wt 114.3 lb

## 2024-02-08 DIAGNOSIS — N289 Disorder of kidney and ureter, unspecified: Secondary | ICD-10-CM

## 2024-02-08 DIAGNOSIS — I341 Nonrheumatic mitral (valve) prolapse: Secondary | ICD-10-CM

## 2024-02-08 DIAGNOSIS — I701 Atherosclerosis of renal artery: Secondary | ICD-10-CM

## 2024-02-08 DIAGNOSIS — E673 Hypervitaminosis D: Secondary | ICD-10-CM

## 2024-02-08 DIAGNOSIS — F331 Major depressive disorder, recurrent, moderate: Secondary | ICD-10-CM

## 2024-02-08 DIAGNOSIS — M810 Age-related osteoporosis without current pathological fracture: Secondary | ICD-10-CM

## 2024-02-08 DIAGNOSIS — I5189 Other ill-defined heart diseases: Secondary | ICD-10-CM

## 2024-02-08 DIAGNOSIS — E559 Vitamin D deficiency, unspecified: Secondary | ICD-10-CM

## 2024-02-08 DIAGNOSIS — I773 Arterial fibromuscular dysplasia: Secondary | ICD-10-CM

## 2024-02-08 DIAGNOSIS — F5101 Primary insomnia: Secondary | ICD-10-CM

## 2024-02-08 DIAGNOSIS — R0689 Other abnormalities of breathing: Secondary | ICD-10-CM

## 2024-02-08 DIAGNOSIS — M5416 Radiculopathy, lumbar region: Secondary | ICD-10-CM

## 2024-02-08 DIAGNOSIS — F1090 Alcohol use, unspecified, uncomplicated: Secondary | ICD-10-CM

## 2024-02-08 DIAGNOSIS — N959 Unspecified menopausal and perimenopausal disorder: Secondary | ICD-10-CM

## 2024-02-08 DIAGNOSIS — R0683 Snoring: Secondary | ICD-10-CM

## 2024-02-08 DIAGNOSIS — F513 Sleepwalking [somnambulism]: Secondary | ICD-10-CM

## 2024-02-08 DIAGNOSIS — K219 Gastro-esophageal reflux disease without esophagitis: Secondary | ICD-10-CM

## 2024-02-08 DIAGNOSIS — R531 Weakness: Secondary | ICD-10-CM

## 2024-02-08 DIAGNOSIS — I10 Essential (primary) hypertension: Secondary | ICD-10-CM

## 2024-02-08 DIAGNOSIS — N182 Chronic kidney disease, stage 2 (mild): Secondary | ICD-10-CM

## 2024-02-08 DIAGNOSIS — E782 Mixed hyperlipidemia: Secondary | ICD-10-CM

## 2024-02-08 DIAGNOSIS — F411 Generalized anxiety disorder: Secondary | ICD-10-CM

## 2024-02-08 DIAGNOSIS — R7989 Other specified abnormal findings of blood chemistry: Secondary | ICD-10-CM

## 2024-02-08 DIAGNOSIS — I671 Cerebral aneurysm, nonruptured: Secondary | ICD-10-CM

## 2024-02-08 DIAGNOSIS — R351 Nocturia: Secondary | ICD-10-CM

## 2024-02-08 DIAGNOSIS — Z9049 Acquired absence of other specified parts of digestive tract: Secondary | ICD-10-CM

## 2024-02-08 NOTE — Assessment & Plan Note (Signed)
 Chronic, stable today. Concerns regarding home blood pressures being high (150s/70s-80s)

## 2024-02-08 NOTE — Progress Notes (Signed)
 "     Established patient visit   Patient: Amber Brooks   DOB: [birth year]   79 y.o. Female  MRN: [id number] Visit Date: 02/08/2024  Today's healthcare provider: LAURAINE LOISE BUOY, DO   Chief Complaint  Patient presents with   Follow-up    Patient is here today due to wanting to discuss her blood work from her visit on 01/24/2024 and also concerned about her blood pressure being high.   Subjective    HPI Amber Brooks is a 79 year old female with hypertension who presents with concerns about high blood pressure and vitamin levels.  She is concerned about her blood pressure, which was high during her last visit but is better today. At home, her blood pressure readings are consistently around 155/88 mmHg, with the diastolic number being higher than usual. She recently switched from amlodipine  to diltiazem  and continues to monitor her blood pressure with a new wrist cuff. She has a family history of heart issues, including a nephew who recently had a heart attack.  She has been taking vitamin D  and B12 supplements, resulting in high levels of these vitamins in her blood work. She was taking B12 for energy after breaking her neck. She is unsure of the doses as they are part of a multivitamin regimen that includes supplements for hair, nails, and brain health.  She experiences frequent nighttime urination, getting up three times per night, but does not have issues with incontinence. She snores heavily and breathes through her mouth, which she believes affects her sleep quality. She has not been evaluated for sleep apnea but is open to it. She drinks protein shakes to regain weight lost previously.  Her family history includes heart disease, and she has a coronary artery calcium  score in November 2024 of 210. She has been referred to electrophysiology by her primary cardiologist and has seen the specialist who did not find significant issues but discussed the possibility of a loop recorder,  which she declined. She is concerned about the impact of high blood pressure on her kidneys and has started taking a vitamin for kidney health after researching online.      Medications: Show/hide medication list[1]       Objective    BP 135/71 (BP Location: Right Arm, Patient Position: Sitting, Cuff Size: Normal)   Pulse 64   Temp 98 F (36.7 C) (Oral)   Ht 5' 2 (1.575 m)   Wt 114 lb 4.8 oz (51.8 kg)   SpO2 99%   BMI 20.91 kg/m     Physical Exam Vitals and nursing note reviewed.  Constitutional:      General: She is not in acute distress.    Appearance: Normal appearance.  HENT:     Head: Normocephalic and atraumatic.  Eyes:     General: No scleral icterus.    Conjunctiva/sclera: Conjunctivae normal.  Cardiovascular:     Rate and Rhythm: Normal rate.  Pulmonary:     Effort: Pulmonary effort is normal.  Neurological:     Mental Status: She is alert and oriented to person, place, and time. Mental status is at baseline.  Psychiatric:        Mood and Affect: Mood normal.        Behavior: Behavior normal.      No results found for any visits on 02/08/24.  Assessment & Plan    Primary hypertension Assessment & Plan: Chronic, stable today. Concerns regarding home blood pressures being high (150s/70s-80s)  Chronic kidney disease, stage 2, mildly decreased GFR  Loud snoring -     Ambulatory referral to Sleep Studies  Nocturia -     Ambulatory referral to Sleep Studies  Gasping for breath -     Ambulatory referral to Sleep Studies  Hypervitaminosis D  Elevated vitamin B12 level    Primary hypertension Blood pressure consistently 154-155/71-88 mmHg. Switched from amlodipine  to diltiazem . Current regimen includes beta blocker and calcium  channel blocker. Discussed adding ACE inhibitor or ARB, but she prefers current regimen. Explained importance of accurate monitoring and potential inaccuracies with wrist cuffs. - Bring blood pressure cuff to next  appointment for calibration check. - Continue current antihypertensive regimen with beta blocker and diltiazem .  Chronic kidney disease, stage 2 Chronic, stable.  UACR normal. Discussed hydration and protein intake for kidney health. - Continue current hydration and protein intake regimen.  Mild snoring; nocturia; gasping for breath Reports snoring, nocturia at least 3 times per night and occasional gasping. No prior evaluation for sleep apnea. Discussed impact on blood pressure and overall health. - Referred for sleep apnea evaluation.  Hypervitaminosis D; elevated vitamin D  B12 Vitamin D  and B12 levels elevated. Taking supplements. Discussed fat-soluble nature of vitamin D  and water-soluble nature of B12. Advised to stop supplements and recheck levels. - Stop vitamin D  and B12 supplementation. - Will recheck vitamin D  and B12 levels in 3-6 months.  General health maintenance Discussed importance of regular exams and wellness checks. Last physical in April 2024. - Will schedule CPE and Medicare annual wellness check appointment.    Return in about 6 weeks (around 03/21/2024) for mAWV with [facility], and in 3 months for Chronic f/u/CPE.      I discussed the assessment and treatment plan with the patient  The patient was provided an opportunity to ask questions and all were answered. The patient agreed with the plan and demonstrated an understanding of the instructions.   The patient was advised to call back or seek an in-person evaluation if the symptoms worsen or if the condition fails to improve as anticipated.    LAURAINE LOISE BUOY, DO  [facility] [phone] (phone) [phone] (fax)  [facility]     [1]  Outpatient Medications Prior to Visit  Medication Sig   aspirin  EC 81 MG tablet Take 1 tablet (81 mg total) by mouth daily. Swallow whole.   carvedilol  (COREG ) 25 MG tablet Take 1 tablet (25 mg total) by mouth 2 (two) times daily.    diltiazem  (CARDIZEM  CD) 240 MG 24 hr capsule Take 1 capsule (240 mg total) by mouth daily.   EPINEPHrine  0.3 mg/0.3 mL IJ SOAJ injection Inject 0.3 mg into the muscle once as needed for anaphylaxis.   esomeprazole  (NEXIUM ) 20 MG capsule Take 1 capsule (20 mg total) by mouth 2 (two) times daily before a meal.   fluticasone  (FLONASE ) 50 MCG/ACT nasal spray Place 1-2 sprays into both nostrils daily.   ibuprofen  (ADVIL ) 800 MG tablet TAKE 1 TABLET BY MOUTH EVERY 8  HOURS AS NEEDED FOR PAIN/HEADACHE   rosuvastatin  (CRESTOR ) 20 MG tablet Take 1 tablet (20 mg total) by mouth daily.   traZODone  (DESYREL ) 50 MG tablet Take 1 tablet (50 mg total) by mouth at bedtime.   venlafaxine  XR (EFFEXOR  XR) 75 MG 24 hr capsule Take 1 capsule (75 mg total) by mouth daily with breakfast.   [DISCONTINUED] EPINEPHrine  (NEFFY ) 2 MG/0.1ML SOLN Place 1 spray into the nose once as needed for  up to 1 dose. May take 1 additional spray in the same nostril beginning 5 minutes after the first dose, if not improving or with worsening symptoms; avoid sniffing during and after administration   No facility-administered medications prior to visit.   "

## 2024-02-11 ENCOUNTER — Ambulatory Visit: Admitting: Urology

## 2024-03-17 ENCOUNTER — Other Ambulatory Visit: Admitting: Urology

## 2024-03-21 ENCOUNTER — Encounter: Admitting: Family Medicine

## 2024-04-14 ENCOUNTER — Encounter: Admitting: Family Medicine
